# Patient Record
Sex: Female | Born: 1935 | Race: White | Hispanic: No | Smoking: Never smoker
Health system: Southern US, Community
[De-identification: ages and names within clinical notes are randomized; demographics above are authoritative.]

## PROBLEM LIST (undated history)

## (undated) DIAGNOSIS — Z860100 Personal history of colon polyps, unspecified: Secondary | ICD-10-CM

## (undated) DIAGNOSIS — I341 Nonrheumatic mitral (valve) prolapse: Secondary | ICD-10-CM

## (undated) DIAGNOSIS — F419 Anxiety disorder, unspecified: Secondary | ICD-10-CM

## (undated) DIAGNOSIS — I639 Cerebral infarction, unspecified: Secondary | ICD-10-CM

## (undated) DIAGNOSIS — F329 Major depressive disorder, single episode, unspecified: Secondary | ICD-10-CM

## (undated) DIAGNOSIS — M545 Low back pain, unspecified: Secondary | ICD-10-CM

## (undated) DIAGNOSIS — G4700 Insomnia, unspecified: Secondary | ICD-10-CM

## (undated) DIAGNOSIS — K219 Gastro-esophageal reflux disease without esophagitis: Secondary | ICD-10-CM

## (undated) DIAGNOSIS — I495 Sick sinus syndrome: Secondary | ICD-10-CM

## (undated) DIAGNOSIS — S1981XA Other specified injuries of larynx, initial encounter: Secondary | ICD-10-CM

## (undated) DIAGNOSIS — M1990 Unspecified osteoarthritis, unspecified site: Secondary | ICD-10-CM

## (undated) DIAGNOSIS — I219 Acute myocardial infarction, unspecified: Secondary | ICD-10-CM

## (undated) DIAGNOSIS — G629 Polyneuropathy, unspecified: Secondary | ICD-10-CM

## (undated) DIAGNOSIS — R079 Chest pain, unspecified: Secondary | ICD-10-CM

## (undated) DIAGNOSIS — I4891 Unspecified atrial fibrillation: Secondary | ICD-10-CM

## (undated) DIAGNOSIS — K5790 Diverticulosis of intestine, part unspecified, without perforation or abscess without bleeding: Secondary | ICD-10-CM

## (undated) DIAGNOSIS — F32A Depression, unspecified: Secondary | ICD-10-CM

## (undated) DIAGNOSIS — A689 Relapsing fever, unspecified: Secondary | ICD-10-CM

## (undated) DIAGNOSIS — K589 Irritable bowel syndrome without diarrhea: Secondary | ICD-10-CM

## (undated) HISTORY — DX: Nonrheumatic mitral (valve) prolapse: I34.1

## (undated) HISTORY — PX: ABDOMINAL HYSTERECTOMY: SHX81

## (undated) HISTORY — DX: Cerebral infarction, unspecified: I63.9

## (undated) HISTORY — DX: Irritable bowel syndrome, unspecified: K58.9

## (undated) HISTORY — DX: Acute myocardial infarction, unspecified: I21.9

## (undated) HISTORY — DX: Gastro-esophageal reflux disease without esophagitis: K21.9

## (undated) HISTORY — PX: NOSE SURGERY: SHX723

## (undated) HISTORY — DX: Anxiety disorder, unspecified: F41.9

## (undated) HISTORY — DX: Unspecified atrial fibrillation: I48.91

## (undated) HISTORY — DX: Personal history of colonic polyps: Z86.010

## (undated) HISTORY — PX: CHOLECYSTECTOMY: SHX55

## (undated) HISTORY — DX: Other specified injuries of larynx, initial encounter: S19.81XA

## (undated) HISTORY — DX: Personal history of colon polyps, unspecified: Z86.0100

## (undated) HISTORY — PX: LAPAROTOMY: SHX154

## (undated) HISTORY — PX: BACK SURGERY: SHX140

## (undated) HISTORY — DX: Major depressive disorder, single episode, unspecified: F32.9

## (undated) HISTORY — DX: Depression, unspecified: F32.A

## (undated) HISTORY — DX: Diverticulosis of intestine, part unspecified, without perforation or abscess without bleeding: K57.90

## (undated) HISTORY — DX: Unspecified osteoarthritis, unspecified site: M19.90

## (undated) HISTORY — DX: Low back pain, unspecified: M54.50

## (undated) HISTORY — DX: Polyneuropathy, unspecified: G62.9

## (undated) HISTORY — DX: Insomnia, unspecified: G47.00

## (undated) HISTORY — DX: Relapsing fever, unspecified: A68.9

## (undated) HISTORY — DX: Low back pain: M54.5

---

## 1995-11-04 ENCOUNTER — Encounter: Payer: Self-pay | Admitting: Gastroenterology

## 1998-12-22 ENCOUNTER — Other Ambulatory Visit: Admission: RE | Admit: 1998-12-22 | Discharge: 1998-12-22 | Payer: Self-pay | Admitting: Obstetrics and Gynecology

## 1999-04-27 ENCOUNTER — Encounter: Payer: Self-pay | Admitting: Emergency Medicine

## 1999-04-27 ENCOUNTER — Emergency Department (HOSPITAL_COMMUNITY): Admission: EM | Admit: 1999-04-27 | Discharge: 1999-04-27 | Payer: Self-pay | Admitting: Emergency Medicine

## 2000-07-15 ENCOUNTER — Other Ambulatory Visit: Admission: RE | Admit: 2000-07-15 | Discharge: 2000-07-15 | Payer: Self-pay | Admitting: Obstetrics and Gynecology

## 2001-01-19 ENCOUNTER — Encounter: Payer: Self-pay | Admitting: Gastroenterology

## 2001-01-19 ENCOUNTER — Ambulatory Visit (HOSPITAL_COMMUNITY): Admission: RE | Admit: 2001-01-19 | Discharge: 2001-01-19 | Payer: Self-pay | Admitting: Gastroenterology

## 2001-04-20 ENCOUNTER — Encounter: Payer: Self-pay | Admitting: Internal Medicine

## 2001-04-20 ENCOUNTER — Encounter: Admission: RE | Admit: 2001-04-20 | Discharge: 2001-04-20 | Payer: Self-pay | Admitting: Internal Medicine

## 2001-08-18 ENCOUNTER — Other Ambulatory Visit: Admission: RE | Admit: 2001-08-18 | Discharge: 2001-08-18 | Payer: Self-pay | Admitting: Obstetrics and Gynecology

## 2002-09-08 ENCOUNTER — Encounter: Payer: Self-pay | Admitting: Internal Medicine

## 2002-09-08 ENCOUNTER — Encounter: Admission: RE | Admit: 2002-09-08 | Discharge: 2002-09-08 | Payer: Self-pay | Admitting: Internal Medicine

## 2003-10-06 ENCOUNTER — Emergency Department (HOSPITAL_COMMUNITY): Admission: EM | Admit: 2003-10-06 | Discharge: 2003-10-06 | Payer: Self-pay | Admitting: Emergency Medicine

## 2005-01-07 ENCOUNTER — Encounter: Admission: RE | Admit: 2005-01-07 | Discharge: 2005-01-07 | Payer: Self-pay | Admitting: Internal Medicine

## 2005-04-02 ENCOUNTER — Ambulatory Visit: Payer: Self-pay | Admitting: Internal Medicine

## 2005-04-11 ENCOUNTER — Ambulatory Visit: Payer: Self-pay | Admitting: Internal Medicine

## 2005-04-11 ENCOUNTER — Ambulatory Visit: Payer: Self-pay

## 2005-11-20 ENCOUNTER — Ambulatory Visit: Payer: Self-pay | Admitting: Gastroenterology

## 2005-11-26 ENCOUNTER — Ambulatory Visit: Payer: Self-pay | Admitting: *Deleted

## 2005-12-10 ENCOUNTER — Ambulatory Visit (HOSPITAL_COMMUNITY): Admission: RE | Admit: 2005-12-10 | Discharge: 2005-12-10 | Payer: Self-pay | Admitting: Gastroenterology

## 2006-01-10 ENCOUNTER — Ambulatory Visit (HOSPITAL_COMMUNITY): Admission: RE | Admit: 2006-01-10 | Discharge: 2006-01-10 | Payer: Self-pay | Admitting: Gastroenterology

## 2006-01-27 ENCOUNTER — Ambulatory Visit: Payer: Self-pay | Admitting: Gastroenterology

## 2006-01-29 ENCOUNTER — Ambulatory Visit: Payer: Self-pay | Admitting: Gastroenterology

## 2006-02-11 ENCOUNTER — Ambulatory Visit: Payer: Self-pay | Admitting: Gastroenterology

## 2006-03-19 ENCOUNTER — Ambulatory Visit: Payer: Self-pay | Admitting: Gastroenterology

## 2006-03-20 ENCOUNTER — Ambulatory Visit: Payer: Self-pay | Admitting: Internal Medicine

## 2006-07-16 ENCOUNTER — Ambulatory Visit: Payer: Self-pay | Admitting: Gastroenterology

## 2006-09-05 ENCOUNTER — Ambulatory Visit: Payer: Self-pay | Admitting: Gastroenterology

## 2006-09-18 ENCOUNTER — Ambulatory Visit: Payer: Self-pay | Admitting: Internal Medicine

## 2006-10-31 ENCOUNTER — Ambulatory Visit: Payer: Self-pay | Admitting: Gastroenterology

## 2006-10-31 LAB — CONVERTED CEMR LAB
Basophils Absolute: 0 10*3/uL (ref 0.0–0.1)
MCV: 90.5 fL (ref 78.0–100.0)
Monocytes Absolute: 0.8 10*3/uL — ABNORMAL HIGH (ref 0.2–0.7)
Monocytes Relative: 7.8 % (ref 3.0–11.0)
Neutro Abs: 7.4 10*3/uL (ref 1.4–7.7)
Neutrophils Relative %: 71.7 % (ref 43.0–77.0)
RBC: 4.81 M/uL (ref 3.87–5.11)
RDW: 12.3 % (ref 11.5–14.6)
TSH: 1.74 microintl units/mL (ref 0.35–5.50)
Tissue Transglutaminase Ab, IgA: <3 units (ref <5)

## 2006-11-10 ENCOUNTER — Ambulatory Visit (HOSPITAL_COMMUNITY): Admission: RE | Admit: 2006-11-10 | Discharge: 2006-11-10 | Payer: Self-pay | Admitting: Gastroenterology

## 2006-12-02 ENCOUNTER — Ambulatory Visit: Payer: Self-pay | Admitting: Gastroenterology

## 2007-01-21 ENCOUNTER — Ambulatory Visit: Payer: Self-pay | Admitting: Gastroenterology

## 2007-02-03 ENCOUNTER — Encounter (INDEPENDENT_AMBULATORY_CARE_PROVIDER_SITE_OTHER): Payer: Self-pay | Admitting: Gastroenterology

## 2007-02-03 ENCOUNTER — Ambulatory Visit: Payer: Self-pay | Admitting: Gastroenterology

## 2007-02-03 HISTORY — PX: UPPER GASTROINTESTINAL ENDOSCOPY: SHX188

## 2007-03-11 ENCOUNTER — Ambulatory Visit: Payer: Self-pay | Admitting: Gastroenterology

## 2007-03-13 ENCOUNTER — Ambulatory Visit (HOSPITAL_COMMUNITY): Admission: RE | Admit: 2007-03-13 | Discharge: 2007-03-13 | Payer: Self-pay | Admitting: Gastroenterology

## 2007-03-27 ENCOUNTER — Ambulatory Visit: Payer: Self-pay | Admitting: Internal Medicine

## 2007-08-05 ENCOUNTER — Ambulatory Visit: Payer: Self-pay | Admitting: Internal Medicine

## 2007-08-06 ENCOUNTER — Ambulatory Visit: Payer: Self-pay | Admitting: Cardiology

## 2007-08-19 ENCOUNTER — Ambulatory Visit: Payer: Self-pay | Admitting: Internal Medicine

## 2007-08-21 ENCOUNTER — Encounter: Admission: RE | Admit: 2007-08-21 | Discharge: 2007-08-21 | Payer: Self-pay | Admitting: Internal Medicine

## 2007-09-16 ENCOUNTER — Ambulatory Visit: Payer: Self-pay | Admitting: Internal Medicine

## 2007-12-07 ENCOUNTER — Encounter (INDEPENDENT_AMBULATORY_CARE_PROVIDER_SITE_OTHER): Payer: Self-pay | Admitting: Gastroenterology

## 2007-12-07 DIAGNOSIS — K5731 Diverticulosis of large intestine without perforation or abscess with bleeding: Secondary | ICD-10-CM | POA: Insufficient documentation

## 2007-12-07 DIAGNOSIS — K5730 Diverticulosis of large intestine without perforation or abscess without bleeding: Secondary | ICD-10-CM

## 2008-05-03 ENCOUNTER — Encounter: Admission: RE | Admit: 2008-05-03 | Discharge: 2008-05-03 | Payer: Self-pay | Admitting: Internal Medicine

## 2008-06-22 ENCOUNTER — Ambulatory Visit: Payer: Self-pay | Admitting: Internal Medicine

## 2008-06-23 ENCOUNTER — Ambulatory Visit: Payer: Self-pay | Admitting: Internal Medicine

## 2008-06-23 DIAGNOSIS — K589 Irritable bowel syndrome without diarrhea: Secondary | ICD-10-CM | POA: Insufficient documentation

## 2008-06-23 DIAGNOSIS — K219 Gastro-esophageal reflux disease without esophagitis: Secondary | ICD-10-CM | POA: Insufficient documentation

## 2008-09-26 ENCOUNTER — Encounter: Admission: RE | Admit: 2008-09-26 | Discharge: 2008-09-26 | Payer: Self-pay | Admitting: Internal Medicine

## 2009-01-30 ENCOUNTER — Telehealth: Payer: Self-pay | Admitting: Internal Medicine

## 2009-01-31 ENCOUNTER — Ambulatory Visit: Payer: Self-pay | Admitting: Gastroenterology

## 2009-02-07 DIAGNOSIS — G4700 Insomnia, unspecified: Secondary | ICD-10-CM

## 2009-02-07 DIAGNOSIS — G8929 Other chronic pain: Secondary | ICD-10-CM | POA: Insufficient documentation

## 2009-02-07 DIAGNOSIS — I059 Rheumatic mitral valve disease, unspecified: Secondary | ICD-10-CM

## 2009-02-07 DIAGNOSIS — F341 Dysthymic disorder: Secondary | ICD-10-CM

## 2009-02-07 DIAGNOSIS — I480 Paroxysmal atrial fibrillation: Secondary | ICD-10-CM | POA: Insufficient documentation

## 2009-02-07 DIAGNOSIS — M549 Dorsalgia, unspecified: Secondary | ICD-10-CM

## 2009-02-07 DIAGNOSIS — M1990 Unspecified osteoarthritis, unspecified site: Secondary | ICD-10-CM | POA: Insufficient documentation

## 2009-02-07 DIAGNOSIS — G609 Hereditary and idiopathic neuropathy, unspecified: Secondary | ICD-10-CM | POA: Insufficient documentation

## 2009-02-22 ENCOUNTER — Encounter: Payer: Self-pay | Admitting: Internal Medicine

## 2009-02-22 ENCOUNTER — Encounter: Admission: RE | Admit: 2009-02-22 | Discharge: 2009-02-22 | Payer: Self-pay | Admitting: Internal Medicine

## 2009-06-23 ENCOUNTER — Ambulatory Visit: Payer: Self-pay | Admitting: Internal Medicine

## 2009-07-26 ENCOUNTER — Telehealth: Payer: Self-pay | Admitting: Internal Medicine

## 2009-07-31 ENCOUNTER — Encounter: Admission: RE | Admit: 2009-07-31 | Discharge: 2009-07-31 | Payer: Self-pay | Admitting: Obstetrics and Gynecology

## 2009-08-03 ENCOUNTER — Ambulatory Visit: Payer: Self-pay | Admitting: Internal Medicine

## 2009-08-29 ENCOUNTER — Telehealth: Payer: Self-pay | Admitting: Internal Medicine

## 2010-03-20 ENCOUNTER — Encounter: Admission: RE | Admit: 2010-03-20 | Discharge: 2010-03-20 | Payer: Self-pay | Admitting: Obstetrics and Gynecology

## 2010-03-23 ENCOUNTER — Encounter: Admission: RE | Admit: 2010-03-23 | Discharge: 2010-03-23 | Payer: Self-pay | Admitting: Obstetrics and Gynecology

## 2010-04-12 ENCOUNTER — Ambulatory Visit: Payer: Self-pay | Admitting: Internal Medicine

## 2010-04-12 ENCOUNTER — Encounter (INDEPENDENT_AMBULATORY_CARE_PROVIDER_SITE_OTHER): Payer: Self-pay | Admitting: *Deleted

## 2010-04-12 DIAGNOSIS — K625 Hemorrhage of anus and rectum: Secondary | ICD-10-CM

## 2010-04-12 DIAGNOSIS — Z860100 Personal history of colon polyps, unspecified: Secondary | ICD-10-CM | POA: Insufficient documentation

## 2010-05-17 ENCOUNTER — Ambulatory Visit: Payer: Self-pay | Admitting: Internal Medicine

## 2010-05-17 HISTORY — PX: COLONOSCOPY: SHX5424

## 2010-05-21 ENCOUNTER — Telehealth: Payer: Self-pay | Admitting: Internal Medicine

## 2010-09-17 ENCOUNTER — Encounter: Admission: RE | Admit: 2010-09-17 | Discharge: 2010-09-17 | Payer: Self-pay | Admitting: Obstetrics and Gynecology

## 2010-11-15 NOTE — Procedures (Signed)
Summary: Colonoscopy  Patient: Kaitlin [name] Note: All result statuses are Final unless otherwise noted.  Tests: (1) Colonoscopy (COL)   COL Colonoscopy           DONE     [facility]     [address]     [place], [place]  [address]           COLONOSCOPY PROCEDURE REPORT           PATIENT:  Kaitlin, [name]  MR#:  [id number]     BIRTHDATE:  08-06-1927, 82 yrs. old  GENDER:  female     ENDOSCOPIST:  Iva Boop, MD, [facility]           PROCEDURE DATE:  05/17/2010     PROCEDURE:  Colonoscopy 19[address]     ASA CLASS:  Class II     INDICATIONS:  rectal bleeding, diverticulitis, history of polyps           MEDICATIONS:   Fentanyl 50 mcg IV, Versed 7 mg IV           DESCRIPTION OF PROCEDURE:   After the risks benefits and     alternatives of the procedure were thoroughly explained, informed     consent was obtained.  Digital rectal exam was performed and     revealed no abnormalities.   The LB [id number]-[id number] endoscope     was introduced through the anus and advanced to the cecum, which     was identified by both the appendix and ileocecal valve.     Moderatelydifficult insertion due to tortuosity and angulated and     fixed sigmoid colon.  The quality of the prep was excellent, using     MoviPrep.  The instrument was then slowly withdrawn as the colon     was fully examined. Insertion: 8:37 minutes Withdrawal: 6:21     minutes     <<PROCEDUREIMAGES>>           FINDINGS:  Severe diverticulosis was found in the sigmoid colon.     This was otherwise a normal examination of the colon. Cecum was not     entered deeply.   Retroflexed views in the rectum revealed no     abnormalities.    The scope was then withdrawn from the patient     and the procedure completed.           COMPLICATIONS:  None     ENDOSCOPIC IMPRESSION:     1) Severe diverticulosis in the sigmoid colon     2) Otherwise normal examination, excellent prep.     RECOMMENDATIONS:     Follow-up as needed.     I believe  rectal bleeding was from ano-rectal irritation.     REPEAT EXAM:  In for not necessary.           Iva Boop, MD, [facility]           CC:  [name], MD     The Patient           n.     eSIGNED:   Iva Boop at 05/17/2010 12:02 PM           Karle Starch, [phone]  Note: An exclamation mark (!) indicates a result that was not dispersed into the flowsheet. Document Creation Date: 05/17/2010 12:04 PM _______________________________________________________________________  (1) Order result status: Final Collection or observation date-time: 05/17/2010 11:51 Requested date-time:  Receipt date-time:  Reported date-time:  Referring Physician:   Ordering Physician: Stan Head [phone]) Specimen Source:  Source: Launa Grill Order Number: [id number] Lab site:

## 2010-11-15 NOTE — Progress Notes (Signed)
Summary: TRIAGE   Phone Note Call from Patient Call back at Home Phone [phone]   Call For: Dr Leone Payor Reason for Call: Talk to Nurse Summary of Call: Colon thursday and feels gassy and burby. Pain in the left side on her ribs is this normal? Has eye appt at 1pm would like call back either before or after 2pm Initial call taken by: Leanor Kail [facility],  May 21, 2010 9:51 AM  Follow-up for Phone Call        Colonoscopy 05-17-10. Pt. is having "Alot of sorness right under my ribs"  Discomfort is RUQ and right side, also increase bloating/burping. She had a low grade temp. on Sat, none since. Denies constipation, diarrhea, blood,black stools.   1) Soft,bland diet. No spicy,greasy,fried foods, for 2-3 days, then advance diet as tolerated. 2) Gas-x,Phazyme, etc. QID for 2-3 days , then as needed for gas & bloating. 3) tylenol/Ibuprofen as needed 4) Heating pad to abdomen as needed. 5) If symptoms become worse call back immediately or go to ER. 6) I will call pt., if new orders, after MD reviews.    Follow-up by: Laureen Ochs LPN,  May 21, 2010 10:29 AM  Additional Follow-up for Phone Call Additional follow up Details #1::        This is correct advice and let us call her back tomorrow to see how she is. Additional Follow-up by: Iva Boop MD, [facility],  May 21, 2010 10:46 AM    Additional Follow-up for Phone Call Additional follow up Details #2::    Update: Pt. states she is feeling better today. If symptoms become worse, pt. will call back immediately, otherwise she will call as needed. Follow-up by: Laureen Ochs LPN,  May 22, 2010 8:16 AM

## 2010-11-15 NOTE — Assessment & Plan Note (Signed)
Summary: DIVERTICULITIS/YF    History of Present Illness Visit Type: Follow-up Visit Primary GI MD: Stan Head MD [facility] Primary Provider: Sharlyn Bologna, MD Requesting Provider: n/a Chief Complaint: Diverticulitis flare 1 month ago, blood in stool History of Present Illness:   77 yo woman with IBS and severe diverticulosis. She had abdominal pain and fever in May with rectal bleedin. Tm > 100. she self treated with hydrocodone. She was ill x a couple of days. Bilateral lower quadrant pain. Bowels were loose then and then became constipated after hydrocodone. She had rectal bleeding with bright red blood on papre only. Ha s had inermttent mild symptoms since, generally ok. No weight loss.    GI Review of Systems      Denies abdominal pain, acid reflux, belching, bloating, chest pain, dysphagia with liquids, dysphagia with solids, heartburn, loss of appetite, nausea, vomiting, vomiting blood, weight loss, and  weight gain.      Reports change in bowel habits, fecal incontinence, and  rectal bleeding.     Denies anal fissure, black tarry stools, constipation, diarrhea, diverticulosis, heme positive stool, hemorrhoids, irritable bowel syndrome, jaundice, light color stool, liver problems, and  rectal pain.    Current Medications (verified): 1)  Ogen 1.25 1.5 Mg Tabs (Estropipate) .... Take 1 Tablet By Mouth Once A Day 2)  Toprol Xl 50 Mg Xr24h-Tab (Metoprolol Succinate) .... Take 1 Tablet By Mouth Once A Day 3)  Vitamin D 2000 Unit Tabs (Cholecalciferol) .... Once Daily 4)  Super B Complex  Tabs (B Complex-C) .... Take 1 Tablet By Mouth Once A Day 5)  Fish Oil Concentrate 1000 Mg Caps (Omega-3 Fatty Acids) .... Take 2 Tabs By Mouth Once Daily 6)  Hydrocodone-Acetaminophen 5-500 Mg Tabs (Hydrocodone-Acetaminophen) .... As Needed 7)  Alprazolam 0.5 Mg Tabs (Alprazolam) .... Take As Needed At Bedtime  Allergies (verified): 1)  Codeine 2)  Axid  Past History:  Past Medical  History: Chronic right upper/lowerquadrant pain. Severe diverticulosis Colon polyp 3mm, destroyed (2003) Gastroesophageal reflux disease. Osteoarthritis. Anxiety and depression. Irritable bowel syndrome. Mitral valve prolapse. Diverticulitis Insomnia Low back pain. Peripheral neuropathy. Atrial tachycardia. Atrial fibrillation. Laryngeal penetration (MBS) IBS  Past Surgical History: Reviewed history from 08/03/2009 and no changes required. cholecystectomy Postoperative bleeding after her cholecystectomy requiring     laparotomy. Back Surgery Hysterectomy  Family History: Reviewed history from 06/23/2009 and no changes required. Throid Cancer: Sister Family History of Prostate Cancer:Grandfather Family History of Diabetes: Father Family History of Heart Disease: Mother, Sister No FH of Colon Cancer:  Social History: Reviewed history from 06/23/2008 and no changes required. Occupation: Retired Patient has never smoked.  Alcohol Use - no Daily Caffeine Use-2 Illicit Drug Use - no Patient gets regular exercise.  Vital Signs:  Patient profile:   77 year old female Height:      66 inches Weight:      131.25 pounds BMI:     21.26 Pulse rate:   60 / minute Pulse rhythm:   regular BP sitting:   118 / 68  (left arm)  Vitals Entered By: Milford Cage NCMA (April 12, 2010 8:40 AM)  Physical Exam  General:  Well developed, well nourished, no acute distress. Eyes:  no icterus.  Lungs:  Clear throughout to auscultation. Heart:  Regular rate and rhythm; no murmurs, rubs,  or bruits. Abdomen:  Soft, nontender and nondistended. No masses, hepatosplenomegaly or hernias noted. Normal bowel sounds. Rectal:  deferred until time of colonoscopy.   Neurologic:  Alert and  oriented x3 Psych:  Alert and cooperative. Normal mood and affect.   Impression & Recommendations:  Problem # 1:  RECTAL BLEEDING (ICD-569.3) Assessment New Sounds likely ano-rectal. Associated with  canges in bowels that are probably IBS but as been 8 years since colonscopy so reinvestigation reasonable.  Risks, benefits,and indications of endoscopic procedure(s) were reviewed with the patient and all questions answered.  Orders: Colonoscopy (Colon)  Problem # 2:  COLONIC POLYPS, HX OF (ICD-V12.72) Assessment: Unchanged 3 mm polyp destroyed, could have been an adenoma. Orders: Colonoscopy (Colon)  Problem # 3:  IRRITABLE BOWEL SYNDROME (ICD-564.1) Assessment: Unchanged Overall suspect this is issue. Some ? of diverticulitis. She has had the fever issue x years. Orders: Colonoscopy (Colon)  Patient Instructions: 1)  Please pick up your medications at your pharmacy. MOVIPREP 2)  We will see you at your procedure on 05/17/10. 3)  [facility] Patient Information Guide given to patient.  4)  Colonoscopy and Flexible Sigmoidoscopy brochure given.  5)  Copy sent to : [name], MD 6)  The medication list was reviewed and reconciled.  All changed / newly prescribed medications were explained.  A complete medication list was provided to the patient / caregiver. Prescriptions: MOVIPREP 100 GM  SOLR (PEG-KCL-NACL-NASULF-NA ASC-C) As per prep instructions.  #1 x 0   Entered by:   Francee Piccolo CMA (AAMA)   Authorized by:   Iva Boop MD, [facility]   Signed by:   Francee Piccolo CMA (AAMA) on 04/12/2010   Method used:   Electronically to        [facility]  [id number] (ret[facility])       [address]       [place], [place]  [address]       Ph: [phone] or [phone]       Fax: [phone]   RxID:   [id number]

## 2010-11-15 NOTE — Letter (Signed)
Summary: [facility] Instructions  [facility]  [address] [place], [place] [address]   Phone: [phone]  Fax: [phone]       CHENAE BRAGER    1927/04/07    MRN: [id number]      Procedure Day Dorna Bloom: THURSDAY, May 17, 2010     Arrival Time: 10:00 AM      Procedure Time: 11:00 AM    Location of Procedure:                    _X_  [facility] (4th Floor)  PREPARATION FOR COLONOSCOPY WITH MOVIPREP   Starting 5 days prior to your procedure 05/13/10 do not eat nuts, seeds, popcorn, corn, beans, peas,  salads, or any raw vegetables.  Do not take any fiber supplements (e.g. Metamucil, Citrucel, and Benefiber).  THE DAY BEFORE YOUR PROCEDURE         WEDNESDAY, 05/16/10  1.  Drink clear liquids the entire day-NO SOLID FOOD  2.  Do not drink anything colored red or purple.  Avoid juices with pulp.  No orange juice.  3.  Drink at least 64 oz. (8 glasses) of fluid/clear liquids during the day to prevent dehydration and help the prep work efficiently.  CLEAR LIQUIDS INCLUDE: Water Jello Ice Popsicles Tea (sugar ok, no milk/cream) Powdered fruit flavored drinks Coffee (sugar ok, no milk/cream) Gatorade Juice: apple, white grape, white cranberry  Lemonade Clear bullion, consomm, broth Carbonated beverages (any kind) Strained chicken noodle soup Hard Candy                             4.  In the morning, mix first dose of MoviPrep solution:    Empty 1 Pouch A and 1 Pouch B into the disposable container    Add lukewarm drinking water to the top line of the container. Mix to dissolve    Refrigerate (mixed solution should be used within 24 hrs)  5.  Begin drinking the prep at 5:00 p.m. The MoviPrep container is divided by 4 marks.   Every 15 minutes drink the solution down to the next mark (approximately 8 oz) until the full liter is complete.   6.  Follow completed prep with 16 oz of clear liquid of your choice (Nothing red or purple).  Continue to  drink clear liquids until bedtime.  7.   Mix second dose of MoviPrep solution:    Empty 1 Pouch A and 1 Pouch B into the disposable container    Add lukewarm drinking water to the top line of the container. Mix to dissolve    Refrigerate  Beginning at 9:00 p.m. (5 hours before procedure):         1. Every 15 minutes, drink the solution down to the next mark (approx 8 oz) until the full liter is complete.         2. Follow completed prep with 16 oz. of clear liquid of your choice.    THE DAY OF YOUR PROCEDURE      THURSDAY, 05/17/10  1. You may drink clear liquids until 9:00 AM (2 HOURS BEFORE PROCEDURE).  MEDICATION INSTRUCTIONS  Unless otherwise instructed, you should take regular prescription medications with a small sip of water   as early as possible the morning of your procedure.       OTHER INSTRUCTIONS  You will need a responsible adult at least 75 years of age to accompany  you and drive you home.   This person must remain in the waiting room during your procedure.  Wear loose fitting clothing that is easily removed.  Leave jewelry and other valuables at home.  However, you may wish to bring a book to read or  an iPod/MP3 player to listen to music as you wait for your procedure to start.  Remove all body piercing jewelry and leave at home.  Total time from sign-in until discharge is approximately 2-3 hours.  You should go home directly after your procedure and rest.  You can resume normal activities the  day after your procedure.  The day of your procedure you should not:   Drive   Make legal decisions   Operate machinery   Drink alcohol   Return to work  You will receive specific instructions about eating, activities and medications before you leave.   The above instructions have been reviewed and explained to me by   _______________________   I fully understand and can verbalize these instructions _____________________________ Date _________

## 2010-12-17 ENCOUNTER — Encounter: Payer: Self-pay | Admitting: Internal Medicine

## 2010-12-27 ENCOUNTER — Encounter: Payer: Self-pay | Admitting: Internal Medicine

## 2010-12-27 ENCOUNTER — Ambulatory Visit (INDEPENDENT_AMBULATORY_CARE_PROVIDER_SITE_OTHER): Payer: Medicare Other | Admitting: Internal Medicine

## 2010-12-27 DIAGNOSIS — K5730 Diverticulosis of large intestine without perforation or abscess without bleeding: Secondary | ICD-10-CM

## 2010-12-27 DIAGNOSIS — F341 Dysthymic disorder: Secondary | ICD-10-CM

## 2010-12-27 DIAGNOSIS — K589 Irritable bowel syndrome without diarrhea: Secondary | ICD-10-CM

## 2011-01-01 NOTE — Assessment & Plan Note (Signed)
Summary: Diarrhea  Medications Added METRONIDAZOLE 250 MG TABS (METRONIDAZOLE) 1 by mouth three times a day x 10 days      Allergies Added:   History of Present Illness Visit Type: Follow-up Visit Primary GI MD: Stan Head MD [facility] Primary Provider: Sharlyn Bologna, MD Requesting Provider: n/a Chief Complaint: Diarrhea with chills and fever and lower abdominal pain History of Present Illness:   77 yo ww here with daughter today "I went out of town for 2 weeks but had to stay 5 due to diarrhea, chills and low-grade fever". she was visiting [place] in [place] loose stools mostly in AM for 3-4 hours, not at night. No recent antibiotics. T max 100.8 Spells are similar to what she has had in past Bowel habits due form up at time. No sugar free candy use. Seems to tolerate milk products. patient ?'s if she could be allergic to the clips from cholecystectomy. daughter indicates "this all started after she had a second, emergency surgery after cholecystectomy" - years ago.    Dr. Earl Gala told her back pain was from severe osteoarthritis. hydrocodone will help back pain and causes slowing of defecation. Also has chronic leg pain and neuropathy.    GI Review of Systems    Reports abdominal pain.     Location of  Abdominal pain: lower abdomen.    Denies acid reflux, belching, bloating, chest pain, dysphagia with liquids, dysphagia with solids, heartburn, loss of appetite, nausea, vomiting, vomiting blood, weight loss, and  weight gain.      Reports diarrhea.     Denies anal fissure, black tarry stools, change in bowel habit, constipation, diverticulosis, fecal incontinence, heme positive stool, hemorrhoids, irritable bowel syndrome, jaundice, light color stool, liver problems, rectal bleeding, and  rectal pain.    Current Medications (verified): 1)  Ogen 1.25 1.5 Mg Tabs (Estropipate) .... Take 1 Tablet By Mouth Once A Day 2)  Toprol Xl 50 Mg Xr24h-Tab  (Metoprolol Succinate) .... Take 1 Tablet By Mouth Once A Day 3)  Vitamin D 2000 Unit Tabs (Cholecalciferol) .... Once Daily 4)  Super B Complex  Tabs (B Complex-C) .... Take 1 Tablet By Mouth Once A Day 5)  Fish Oil Concentrate 1000 Mg Caps (Omega-3 Fatty Acids) .... Take 2 Tabs By Mouth Once Daily 6)  Hydrocodone-Acetaminophen 5-500 Mg Tabs (Hydrocodone-Acetaminophen) .... As Needed 7)  Alprazolam 0.5 Mg Tabs (Alprazolam) .... Take As Needed At Bedtime  Allergies (verified): 1)  Codeine 2)  Axid  Past History:  Past Medical History: Last updated: 04/12/2010 Chronic right upper/lowerquadrant pain. Severe diverticulosis Colon polyp 3mm, destroyed (2003) Gastroesophageal reflux disease. Osteoarthritis. Anxiety and depression. Irritable bowel syndrome. Mitral valve prolapse. Diverticulitis Insomnia Low back pain. Peripheral neuropathy. Atrial tachycardia. Atrial fibrillation. Laryngeal penetration (MBS) IBS  Past Surgical History: Last updated: 08/03/2009 cholecystectomy Postoperative bleeding after her cholecystectomy requiring     laparotomy. Back Surgery Hysterectomy  Family History: Last updated: 06/23/2009 Throid Cancer: Sister Family History of Prostate Cancer:Grandfather Family History of Diabetes: Father Family History of Heart Disease: Mother, Sister No FH of Colon Cancer:  Social History: Last updated: 06/23/2008 Occupation: Retired Patient has never smoked.  Alcohol Use - no Daily Caffeine Use-2 Illicit Drug Use - no Patient gets regular exercise.  Review of Systems       Chills   Vital Signs:  Patient profile:   77 year old female Height:      66 inches Weight:      125 pounds BMI:  20.25 BSA:     1.64 Pulse rate:   80 / minute Pulse rhythm:   regular BP sitting:   124 / 62  (left arm)  Vitals Entered By: Merri Ray CMA Duncan Dull) (December 27, 2010 3:53 PM)  Physical Exam  General:  Well developed, well nourished, no acute  distress. Lungs:  Clear throughout to auscultation. Heart:  Regular rate and rhythm; no murmurs, rubs,  or bruits. Abdomen:  Soft, nontender and nondistended. No masses, hepatosplenomegaly or hernias noted. Normal bowel sounds. Psych:  anxious  CBC, CMET ESR all normal except glucose 121 - Dr. Earl Gala  Impression & Recommendations:  Problem # 1:  IRRITABLE BOWEL SYNDROME (ICD-564.1) Assessment Deteriorated she has had similar problems x years. Understand they can be disabling but seems most like IBS. Dr. Corinda Gubler had same conclusion. she could have more of an episodic post-chole diarrhea. acknowledges that stress exacerbates - seems like she had some around time of visit to her birthplace and brother but did not elaborate fails to see that this is same problem and accept IBS colonoscopy 2011 with severe diverticulosis as before reports of fever x yeras, has had extensive eval including ID referral in past  1) empiric metronidazole- could have some small bowel bacterial overgrowth or even infection on top of IBS 2) creon 24K sammples given - she may benefit from this and is to try if metronidazole does not solev this current problem 3) rassured but am not certain it took  note that alprazolam helps but she is against any other medication or more use of that - I raised Cymbalta given back pai (suggested she discuss with PCP) but she does not want Rx like that -   Problem # 2:  ANXIETY DEPRESSION (ICD-300.4) Assessment: Deteriorated clearly upset over these problems and has longstanding underlying issues with this  Problem # 3:  DIVERTICULOSIS OF COLON (ICD-562.10) Assessment: Unchanged  Patient Instructions: 1)  You have been given a prescription for metronidazole.  2)  If no better after taking antibiotics, please start Creon samples one tablet by mouth three times a day with meals. Please call us back for a prescription if this medication helps with your symptoms pr as needed. 3)   Copy sent to : Sharlyn Bologna, MD 4)  The medication list was reviewed and reconciled.  All changed / newly prescribed medications were explained.  A complete medication list was provided to the patient / caregiver. Prescriptions: METRONIDAZOLE 250 MG TABS (METRONIDAZOLE) 1 by mouth three times a day x 10 days  #30 x 0   Entered and Authorized by:   Iva Boop MD, [facility]   Signed by:   Iva Boop MD, FACG on 12/27/2010   Method used:   Print then Give to Patient   RxID:   [id number]

## 2011-02-26 NOTE — Assessment & Plan Note (Signed)
[facility]                         GASTROENTEROLOGY OFFICE NOTE   NAME:Kaitlin [name], Kaitlin [name]                      MRN:          [id number]  DATE:03/11/2007                            DOB:          [birth year]    The patient comes in says she has been having some abdominal pain with  chills, choking feeling in the throat, dysphagia, sounds like a motility  problem.  The patient has no appetite, nausea.  Sounds like some  depression, but she denies this.  She states she is not sleeping.  Main  concern is some right CVA pain and some right upper quadrant pain.   PHYSICAL EXAMINATION:  She looks fine.  Her weight was 117, blood pressure 132/70, pulse 68 and regular.  Neck and upper extremities unremarkable.  She did have some discomfort  on her right upper quadrant and right lower quadrant.  Some discomfort  was noted over the bladder.   This very nice lady was worked up by myself for possibly having sprue,  but this was negative.  Her biopsies were negative.   MEDICATIONS:  1. Ogen.  2. Toprol.  3. Vitamins.  4. AcipHex 1 daily.   IMPRESSION:  Abdominal pain, as described, of unknown etiology.  Rule  out possibility of diverticulitis.   RECOMMENDATIONS:  Start her on some Cipro 500 mg b.i.d. and some Align.  Discussed with her barium swallow and an upper GI and small bowel series  to be on the complete side.  She is given prescription for Cipro.  I  gave her some Align pills and told her to call us back if she is not  improved, and to follow up after the x-rays and treatment with Dr.  Leone Payor, whom I have referred her to.     Ulyess Mort, MD  Electronically Signed    SML/MedQ  DD: 03/11/2007  DT: 03/11/2007  Job #: [id number]

## 2011-02-26 NOTE — Assessment & Plan Note (Signed)
[facility]                         GASTROENTEROLOGY OFFICE NOTE   NAME:Kaitlin [name], Kaitlin [name]                      MRN:          [id number]  DATE:03/27/2007                            DOB:          05-14-1927    CHIEF COMPLAINT:  Abdominal pain.  Followup of studies.   Ms. Baumbach is a long-standing patient of Dr. Blossom Hoops, who is  transferring her care to me with his retirement.  She has a long history  of right-sided abdominal pain and fluctuating weight levels.  Recently,  things had flared up and she had an upper GI series and a small bowel  follow through.  The upper GI small bowel follow through demonstrated a  small hiatal hernia and otherwise normal exam.  There was no hernia  seen, though there was some question that she had a femoral or inguinal  hernia.  She has a bulge in the right lower quadrant or a knot that  occurs there.  She had some bloating.  She had some vague dysphagia  problems at times.  She chokes on food.  It is hard to swallow.  She had  an upper endoscopy in April that was okay.  She has postprandial  diarrhea that is frequent.  Last colonoscopy several years ago.  She has  a very tortuous diverticula-laden colon.  When one looks back through  the chart and the records, she has had this chronic right upper quadrant  pain ever since she had her cholecystectomy.  She had an ERCP in 1997  that was normal.  She has chronically mildly dilated bile ducts,  standard or normal for post cholecystectomy.  She has had some positive  antigliaden antibodies, but a negative small bowel biopsy.  She recently  had some problems with microscopic hematuria.  Workup there with CT  scanning and cystoscopy through Dr. Bjorn Pippin has been unrevealing.   PROBLEMS/STUDIES:  1. Chronic right upper quadrant pain.  2. Normal gastric emptying study.  3. Small bowel follow through as above.  4. Colonoscopy with severe diverticulosis and tortuous colon,  3 mm      sessile polyp hot biopsied, not retrieved so we do not have      pathology on that.  5. Status post cholecystectomy.  6. Clinical diagnosis of gastroesophageal reflux disease.  7. History of cardiac arrhythmia.  8. Prior hysterectomy.  9. Osteoarthritis.  10.Anxiety and depression.  11.Irritable bowel syndrome.  12.Mitral valve prolapse.  13.She has had diverticulitis in the past, apparently.  14.Insomnia, helped by Xanax.  15.Low back pain.  16.Postoperative bleeding after her cholecystectomy requiring      laparotomy.  17.Peripheral neuropathy.  18.Prior spine surgery.  19.Left knee surgery for Baker's cyst.  20.Atrial tachycardia.  21.Atrial fibrillation.  22.Apparently, she has had a modified barium swallow with some      laryngeal penetration with thin liquids with mildly decreased      laryngeal closure and decreased sensation.  She is to tuck her chin      when she drinks.  She says it is hard to drink water that way.  I  suggested she use a straw.   MEDICATIONS:  1. Ogen daily.  2. Toprol 50 mg daily.  3. Fish oil daily.  4. Multivitamin daily.  5. AcipHex daily.  6. Xanax 0.25 mg p.r.n. at night.   PHYSICAL EXAM:  Anxious-appearing white woman.  Weight 115 pounds, pulse 88, blood pressure 118/72.  ABDOMEN:  Soft.  I cannot detect any true hernia.  She is minimally  tender in the right lower quadrant.  There is no organomegaly or mass.  Cholecystectomy scar is noted.   ASSESSMENT:  This lady clearly has irritable bowel syndrome, which I  think is her biggest problem.  She seems to relax and tells me her  gastrointestinal symptoms improve with the Xanax.  Her pressure in her  throat and her dysphagia seem to resolve, though she is only using that  at night.  It does not cause too much sedation, but it does allow her to  sleep.   PLAN:  I have suggested that she take the Xanax 2 to 3 times a day and I  have given her a prescription for that.  I  think that is probably the  simplest approach.  She had been prescribed Effexor in the past, but  does not want to take antidepressants.  An SSRI could potentially help  this lady versus Effexor, but she seems unwilling.  At her age and with  her problems, I think the Xanax makes sense.  She will try that and call  me back if there are other issues.  Her weight loss is noted.  This is  really not new.  She has fluctuated.  She is 115 pounds, which is a  little lower than she has been previously, but she has been in the low  120s in the past at least.   I do not think there is any further role for investigations at this time  with the 3 mm diminutive polyp and her colonoscopy findings, and  chronicity of symptoms, I think I would hold off on any type of  surveillance or screening colonoscopy at this point.     Iva Boop, MD,FACG  Electronically Signed    CEG/MedQ  DD: 03/27/2007  DT: 03/28/2007  Job #: [id number]   cc:   Kaitlin [name], M.D.  Excell Seltzer. Annabell Howells, M.D.

## 2011-02-26 NOTE — Assessment & Plan Note (Signed)
[facility]                         GASTROENTEROLOGY OFFICE NOTE   NAME:Kaitlin [name], Kaitlin [name]                      MRN:          [id number]  DATE:08/19/2007                            DOB:          01-Jun-1927    CHIEF COMPLAINT:  Followup of abdominal pain.   She had called the office recently with back pain radiating into the  suprapubic and pelvic area.  I had wondered about diverticulitis and  prescribed Cipro and Flagyl.  CT scanning did not show any evidence of  acute diverticulitis.  It showed stable post cholecystectomy biliary  dilation.  She has lumbar spine degenerative changes as well.  Those  were stable.   She describes where she really had spells of low back pain radiating  around to the bilateral pelvic areas.  She also had quite a bit of  urinary frequency and problems.  She has chronic neuropathy problems,  but she had increased paresthesias and leg pain with this as well.  She  completed the antibiotics, and she thinks the urinary symptoms are  better.  She remembered she had some hydrocodone that she was given for  cough previously, and she took two of those recently.  She has not had  any pain for several days. She does feel better.  She is current about  weight loss, but she is up 5 pounds.  There may have been some chills,  no proven fever, as I am aware of.  She was having some headaches and  loose stools but no bleeding.  She relates that she had seen Dr. Annabell Howells  because of hematuria and apparently had an extensive workup that was  negative earlier in the year.  She does not describe a diagnosis of  interstitial cystitis that I am aware of.   See my note of March 27, 2007 for a previous past medical history.  We  will add hematuria and bladder problems to that question of interstitial  cystitis.   MEDICATIONS:  Listed and reviewed on the chart.   ALLERGIES:  Listed and reviewed on the chart.   PHYSICAL EXAMINATION:  Weight 120  pounds.  Height 5 feet 7.  Pulse 67,  blood pressure 136/80.  ABDOMEN:  Soft and nontender.   ASSESSMENT:  Abdominal pain radiating to the suprapubic area with lower  extremity pain.  This may be a radicular problem with her lumbar spine  problems, although it could be some sort of bladder problem as well.  I  do not think it was a bladder problem, i.e., her irritable bowel  possible.  At any rate, she is better.   PLAN:  1. Hydrocodone/APAP 5 mg/500 mg 1 every 4 hours as needed, #30, no      refills.  2. Follow up with Drs. Earl Gala and [place] on these matters.  3. See me as needed.     Iva Boop, MD,FACG  Electronically Signed    CEG/MedQ  DD: 08/19/2007  DT: 08/19/2007  Job #: [id number]   cc:   [name], M.D.  Excell Seltzer. Annabell Howells, M.D.

## 2011-02-26 NOTE — Assessment & Plan Note (Signed)
[facility]                         ELECTROPHYSIOLOGY OFFICE NOTE   NAME:Kaitlin [name]                      MRN:          [id number]  DATE:06/22/2008                            DOB:          [birth year]    Kaitlin [name] is having occasional palpitations.  She is back down to  Toprol once a day because she could not tolerate it twice a day.   She continues to have her intermittent chills.  These are becoming  increasingly frequent over the last 11 years.  She has seen Dr. Nedra Hai in  the past, and I have suggested maybe this is some __________ again.   MEDICATIONS:  Include the Toprol 50 as well as Xanax p.r.n.   PHYSICAL EXAMINATION:  VITAL SIGNS:  Her blood pressure is 128/74 and  her pulse is 68.  LUNGS:  Clear.  HEART:  Sounds were regular.  EXTREMITIES:  Without edema.   Electrocardiogram today demonstrated sinus rhythm with occasional PACs.  The rate was 68, the intervals were 0.19/0.19/0.38, and the axis was  leftward of -55.   IMPRESSION:  1. Paroxysmal atrial fibrillation with a rapid ventricular response,      currently largely quiescent.  2. Recurrent fevers over the last 10 years.   Kaitlin [name] is doing fine.  We will plan to see her again at her  request.  She will follow up with Dr. Earl Gala.     Duke Salvia, MD, [facility]  Electronically Signed    SCK/MedQ  DD: 06/22/2008  DT: 06/23/2008  Job #: [id number]   cc:   [name], M.D.

## 2011-02-26 NOTE — Letter (Signed)
September 16, 2007    [name], M.D.  [address] [place], [place] [address]   RE:  Kaitlin [name], Kaitlin [name]  MRN:  [id number]  /  DOB:  [birth year]   Dear Rosanne Ashing,   Mrs. Burack came in today.  She has continued to have palpitations.  She notes that they are worse in the wake of these fevers and chills,  accompanied by rigors, that she has had apparently for ten years, ever  since her cholecystectomy.  She continues to wonder whether something  was left behind and I told her that radiographic sponges should not be  evident.  In any case, this continues to be an issue, despite an  extensive, protracted workup.   She says that she has had recent blood work.   CURRENT MEDICATIONS INCLUDE:  Toprol 50 mg a day.  She had called during  the summer, apparently, though I do not see a message in the chart,  asking for permission to increase it when she is having more symptoms.  She was asked to come in if she was having more problems and here she  is.  She also takes AcipHex and Xanax.   ON EXAMINATION:  Her blood pressure is 120/73, her pulse is 62.  LUNGS:  Clear.  Heart sounds were regular.  EXTREMITIES:  Without edema.   Electrocardiograms were interesting in that they demonstrate rapid  atrial fibrillation with intermittent reversion to sinus rhythm and then  back again into atrial fibrillation.   IMPRESSION:  1. Atrial fibrillation - paroxysmal with a rapid ventricular response      accompanied by chest pain and shortness of breath.  2. Recurrent fevers, chills and rigors over the last ten years, dating      back to a gallbladder surgery.   Mrs. Kambrie, [place], continues to have her atrial fibrillation.  I have  increased her Toprol from once a day to twice a day.  She is to let us  know if she has more symptoms.  Otherwise, I will plan to see her again  in nine months' time.   I did ask her to make sure also that, with her most recent blood work,  you would check the hemoglobin  and a TSH, as changes in these may  explain her increasing symptoms of atrial fibrillation.   Hope you and your family have a Merry Christmas.    Sincerely,      Duke Salvia, MD, [facility]  Electronically Signed    SCK/MedQ  DD: 09/16/2007  DT: 09/16/2007  Job #: [id number]

## 2011-02-26 NOTE — Assessment & Plan Note (Signed)
[facility]                         GASTROENTEROLOGY OFFICE NOTE   NAME:Kaitlin [name], Kaitlin [name]                      MRN:          [id number]  DATE:08/05/2007                            DOB:          24-Oct-1926    PROBLEMS:  See list from June 2008 note.   CHIEF COMPLAINT:  Abdominal pain.   The patient called the office complaining of worsening abdominal pain.  She apparently has had fever recently, up to 101.5.  I started her on  Cipro and Flagyl 500 b.i.d., both, on August 03, 2007.  Soft, bland  diet.  She has not had fever since that time, but she does tell me she  took her temperature in the last several days and it did range up to the  101.5 level.  Last day or two it has been around 99.  She is complaining  of a right lower quadrant pain, a right back pain that is fairly  intense, and some crampy left lower quadrant pain.  Additional problems  include that she has had bad licks on her head, the last one 4 weeks  ago.  Her neck has been stiff, she has had a headache.  She has not seen  her primary care physician, Dr. Earl Gala, because he is not available at  this time.  She has seen Dr. Annabell Howells of urology, as well.  He does not  have a diagnosis of interstitial cystitis that I am aware of at this  time.  I specifically questioned her about this.  She remains focused on  problems stemming from previous cholecystectomy in the late 1990s.  Apparently, she has had hemorrhage and bleeding, and she thinks she has  had a lot of problems ever since then.  She has had chronic diarrhea  since that time, but I do not think she has ever been on cholestyramine  or Colestid.  She knows she has a diagnosis of irritable bowel syndrome.  She increased her Xanax to 1 twice a day occasionally, but it does not  sound like that has benefitted her.  She feels like that her symptoms  and problems are interfering with her ability to function and to live an  active life.  She  sort of rambles at times, talking about previous H.  pylori.  Her colon and intestines are the worst twisted intestines Dr.  Doreatha Martin had ever seen (she does have severe sigmoid diverticulosis), he  mentioned the possibility of surgery for that at some point, but thought  that they should try to avoid that.  She indicates that she is very  concerned about ever having to go to the hospital because of the  terrible experience with the cholecystectomy (question presumably due to  the bleed).  She focuses on clips left inside her body that could be  causing infection, she wonders.  She is having some nocturia, but no  real dysuria or anything like that that I can tell.  She went for a  modified barium swallow and received recommendations from speech  pathology, but still feels like she is choking at times when she  swallows.   MEDICATIONS:  Listed and reviewed in the chart as are allergies.   Note, her diarrhea has been constant and not changed with this, but the  pain is, perhaps, somewhat changed, it sounds like.   Height 5 feet 6 inches, weight 120 pounds, pulse 72, blood pressure  132/82.  This is a well-appearing, thin, elderly white woman in no acute  distress.  On physical exam, she is tender to the right of the umbilicus with some  guarding there.  There is no mass or organomegaly.  The abdominal wall  muscles are weak and thin.  She appears somewhat anxious.   ASSESSMENT:  1. I know she has irritable bowel syndrome and that is at least part      of this.  2. Fevers, abdominal pain, question relationship to diverticulosis and      diverticulitis.  3. What sounds almost like post traumatic stress disorder related to      her previous surgery and complications.  I did try to explain that      the infection that she has had, or, at least, the fevers, etc.,      really have nothing to do with her previous cholecystectomy.  I      suppose it is within the realm of possibility, but  overall should      be impossible to have problems this far out related to that that we      would not have discovered with other imaging.   PLAN:  1. CT of the abdomen and pelvis with IV and oral contrast to look for      any evidence of diverticulitis at this time.  Note, sometimes      people do have diverticulitis that is not evident on CT scans, and      not really evident until they have a segmental resection of the      colon.  She may need that, though I think I would proceed with      caution.  2. Continue Cipro and Flagyl.  3. She should get help regarding her headaches and neck stiffness and      pain from primary care, perhaps the walk-in clinic at [facility].  I      have recommended this to her.  4. I will see her in 3 weeks at which time we will consider the      possibility of different anxiolytic or selective serotonin reuptake      inhibitor perhaps.  I also think that trying Colestid or      cholestyramine (probably Colestid) to help her diarrhea makes      sense.  Repeat colonoscopy could be indicated, surgical referral      could be indicated.  Will proceed cautiously here, but I think that      underlying issues include irritable bowel, anxiety, and perhaps      some obsessive compulsive tendencies/post traumatic stress disorder      issues as they relate to her health issues.  She could certainly      have either a colitis related to diverticulosis or diverticulitis      (I am assuming she does not have interstitial cystitis at this      time).     Iva Boop, MD,FACG  Electronically Signed    CEG/MedQ  DD: 08/05/2007  DT: 08/06/2007  Job #: [id number]   cc:   [name], M.D.  Excell Seltzer. Annabell Howells, M.D.

## 2011-03-01 NOTE — Assessment & Plan Note (Signed)
 [facility]                           GASTROENTEROLOGY OFFICE NOTE   NAME:Kaitlin [name], Kaitlin [name]                 MRN:          [id number]  DATE:07/16/2006                            DOB:          02-12-1927    This very nice lady comes in on July 16, 2006 and states she has no  energy, very lethargic, afraid her blood is low.  She has been having some  epigastric and right lower quadrant pain, which the latter seemed to be  possibly a small femoral hernia, although this was not seen on CT scan.  She  says she is all keyed up, complains of diarrhea with mucus, cannot sleep at  night, rectal burning, no get-up-and-go, lower back pain that is quite  uncomfortable.  She says she takes Xanax at times.  She has been advised to  take antidepressant at times, but has not taken it because she was afraid  she would get hooked on these drugs.  We had a long discussion about this  and hopefully I convinced her otherwise.   PHYSICAL EXAMINATION:  In any case, her weight was 120, blood pressure  138/80, pulse 80 and regular.  Oropharynx was negative.  NECK:  Negative.  CHEST:  Clear.  HEART:  Regular rhythm without significant murmur.  ABDOMEN:  Soft.  No masses or organomegaly.   IMPRESSION:  1. Marked underlying anxiety and depression.  2. Probable femoral hernia.  3. History of Helicobacter pylori, which has been treated.  4. Status post cholecystectomy.  5. History of splenomegaly.  6. Gastroesophageal reflux disease with a history of dysphagia.  7. Status post colon polyps.  8. Osteoarthritis, mild.  9. Status post hysterectomy and cholecystitis.  10.History of cardiac arrhythmias.   MEDICATIONS:  Ogen, Toprol-XL, Aciphex, vitamins and Xanax.   TREATMENT:  My treatment was to put her on Effexor, increasing it to 75 mg,  put her on some a __________ ProBiotica and told her to come back in 6  weeks.  I tried to convinced her she was going to do  better once she got  this therapy accomplished.            ______________________________  Ulyess Mort, MD      SML/MedQ  DD:  07/16/2006  DT:  07/18/2006  Job #:  [id number]

## 2011-03-01 NOTE — Assessment & Plan Note (Signed)
[facility]                         GASTROENTEROLOGY OFFICE NOTE   NAME:Kaitlin [name], Kaitlin [name]                      MRN:          [id number]  DATE:12/02/2006                            DOB:          November 22, 1926    I am meeting Ms. Calandro for the first time today.  She is a long-time  patient of Dr. Doreatha Martin [facility].  Looking through her chart, she has had  multiple upper and lower GI complaints over several years.  Most  recently Dr. Doreatha Martin has been working with her with weight loss.  He last  saw her about a month ago, got a gastric emptying scan that was normal.  He also did a set of blood tests.  The CBC was completely normal,  thyroid testing was normal, the sed rate was normal, tTg was normal, but  her antigliadin antibodies were very elevated.  Today in the office she  tells me she has a variety of pains, most of which have been going on  for at least a decade or so.  I see another concern of hers is  colorectal cancer screening.  Her last lower examination was a full  colonoscopy in April 2003.  This was done for bloating.  Dr. Doreatha Martin removed  but did not retrieve a 3 mm sessile polyp.  It is not clear if this was  hyperplastic or an adenoma.  His recommendations were for her to get  another colonoscopy in 4 years.   CURRENT MEDICATIONS:  Ogen, Toprol, Aciphex, Xanax, fish oil, flax seed,  vitamin B.   PHYSICAL EXAMINATION:  Weight 119 pounds, which is up 2 pounds since her  last visit, blood pressure 110/72, pulse 72.  CONSTITUTIONAL:  Generally well-appearing.  NEUROLOGIC:  Alert and oriented x3.  LUNGS:  Clear to auscultation bilaterally.  ABDOMEN:  Soft, nontender, nondistended, normal bowel sounds.   ASSESSMENT AND PLAN:  [age]-year-old woman with chronic  abdominal discomforts, more recent weight loss and equivocal celiac  sprue testing on recent lab tests.   First, I think we should proceed with endoscopy and duodenal biopsy to  ascertain whether she truly does have celiac sprue.  I explained to her  this was a sensitivity to gluten, and that if she does have this she  would have to avoid wheat products.  It is quite a change in her diet,  and she may need even dietary consultation for that.  I do not recommend  people to just start this change diet without tissue proof.  She is not  sure if she wants to go ahead and do this.  She will discuss it with her  daughter and will let us know.  The other issue is that of colorectal  cancer screening.  She had a full colonoscopy by Dr. Doreatha Martin in 2003.  There  was a 3 mm polyp that was removed but not retrieved.  This may be  hyperplastic, it may have been a tubular adenoma.  The current guideline  recommendations, even if this is a tubular adenoma, are for repeat  colonoscopy in 5 to 10 years.  Given  her very tortuous colon with severe  diverticulosis, I would  have consider maybe doing this in 7 or 8 years, given the difficulty of  the exam.  If we knew this were a hyperplastic polyp she would not be  due again until 2013.   She will discuss EGD with her daughter and will get back to [place].     Rachael Fee, MD  Electronically Signed    DPJ/MedQ  DD: 12/02/2006  DT: 12/03/2006  Job #: [id number]

## 2011-03-01 NOTE — Assessment & Plan Note (Signed)
[facility]                         GASTROENTEROLOGY OFFICE NOTE   NAME:[name], [name]                      MRN:          [id number]  DATE:10/31/2006                            DOB:          [birth year]    Kaitlin [name] comes in, says she still is losing some weight, which she is.  She  is taking her Ensure 2 cans a day.  She says she had been having  intermittent fever and chills, questionable early satiety.  She was on  Cipro for a urinary tract infection and did not get any better.  She  said her physician suggested maybe she had gastroparesis and this was  why she was losing weight.  She has been having some lower abdominal  pain as well.   PHYSICAL EXAMINATION:  She weighed 117, which is 3 pounds less than her  previous visit, blood pressure is 92/58, pulse 76 and regular.  OROPHARYNX:  Negative.  NECK:  Negative.  CHEST:  Clear.  HEART:  Revealed a regular rhythm.  ABDOMEN:  Soft, no mass or organomegaly.  There were no bruits or rubs.  The abdomen revealed what I thought were symptoms probably of a small  femoral hernia.  EXTREMITIES:  Unremarkable.  RECTAL:  Deferred.   IMPRESSION:  1. Weight loss of questionable etiology.  2. Irritable bowel syndrome with a patient who has known diverticular      disease and a very tortuous colon.  3. Gastroesophageal reflux disease with dysphagia.  4. Status post cholecystectomy.  5. History of osteoarthritis.  6. Status post hysterectomy.  7. Marked underlying anxiety and depression.  The patient will not      take antidepressants.  8. Rule out femoral hernia.   RECOMMENDATIONS:  1. Get gastric emptying study.  2. Routine labs with TSH, CBC, sed rate and CMET and a celiac sprue      study.  She asked about the sprue study, and she is right about it.     Kaitlin Mort, MD  Electronically Signed    SML/MedQ  DD: 10/31/2006  DT: 11/01/2006  Job #: [id number]

## 2011-03-01 NOTE — Assessment & Plan Note (Signed)
[facility]                         GASTROENTEROLOGY OFFICE NOTE   NAME:Forman, [name]                      MRN:          [id number]  DATE:01/21/2007                            DOB:          May 13, 1927    Rodney Cruise comes in April 9 and says she had a urinary tract infection seen  by Dr. Annabell Howells.  Has a knot in her right lower quadrant and it bothers her  sometimes.  She told me it has been going on almost 10 years and has not  gotten any worse.  It does not really get worse with standing up.  I  thought at first it might be a femoral hernia or it may be associated  with one, but missed very early probably or it could be an adhesion.  She was seen most recently by Dr. Christella Hartigan whose assessment was that she  was a 77 year old chronic abdominal discomfort and some recent weight  loss with a question of equivocal celiac sprue testing on recent lab  test.  This nice patient has known GERD with some mild dysphagia in the  past.  She has taken AcipHex. She also takes Toprol 50, Ogen, vitamins,  Xanax, fish oil and vitamin B.  Her medical problems include having  GERD, as I noted, irritable bowel syndrome with severe diverticular  disease, has a very tortuous colon.  She is status post cholecystectomy,  osteoarthritis, hysterectomy.  She has a history of a known anxiety and  depression and she has a history of a femoral hernia, which is described  from prior.  She was prescribed Effexor, but she is not taking this  anymore.   PHYSICAL EXAMINATION:  She weighs 120, blood pressure 140/70, pulse 72  and regular.  Oropharynx was negative.  NECK:  Negative.  CHEST:  Clear.  HEART:  Revealed a regular rhythm.  ABDOMEN:  Soft, no mass or splenomegaly, nontender.  There were no  bruits or rubs and bowel sounds were normal.   IMPRESSION:  1. Gastroesophageal reflux disease.  2. Right lower quadrant pain, probably from hernia.  3. Status post cholecystectomy.  4.  History of colon polyps and severe diverticular disease.  5. Cardiac arrhythmia.  6. Status post hysterectomy.  7. History of osteoarthritis.  8. Marked underlying anxiety and depression.  9. Irritable bowel syndrome with very tortuous colon.  10.Abnormal gliadin antibodies of significance.   RECOMMENDATIONS:  We obtain an upper endoscopy with biopsies of her  jejunum and duo, checking for villous atrophy to determine if there is  any villous atrophy to go along with the findings, even though her  glutaminase antibody was normal.     Ulyess Mort, MD  Electronically Signed    SML/MedQ  DD: 01/21/2007  DT: 01/22/2007  Job #: [id number]

## 2011-03-01 NOTE — Assessment & Plan Note (Signed)
 [facility]                         GASTROENTEROLOGY OFFICE NOTE   NAME:Kaitlin [name], Kaitlin [name]                      MRN:          [id number]  DATE:09/05/2006                            DOB:          [birth year]    PRESENT ILLNESS:  This lady says that she is having some lower abdominal  pain radiating to the back, a lot of mucus in her throat.  AcipHex helps  but it not working as great as it was.  I discussed in depth about her  colon and how tortuous it is.  I thought much of her symptoms, probably  they were based on this.  We have talked about this in the past.  She  does have minor severe diverticular disease.  She has had this tortuous  colon on all our exams.  She says she has a lot of gas and bloating at  times.  She says she is not taking the Effexor like she was.  I told her  that she really needed to do that, but there has continued to be some  underlying anxiety and depression.   PHYSICAL EXAMINATION:  VITAL SIGNS:  Rate 120, which is unchanged.  Her  blood pressure is 130/70, pulse 60 and regular.  HEENT:  Oropharynx was negative.  NECK:  Negative.  CHEST:  Clear to auscultation.  HEART:  Revealed a regular rhythm without a significant murmur.  ABDOMEN:  Soft, nontender.  There is no masses or organomegaly, no  bruits or rubs.  EXTREMITIES:  Unremarkable.  RECTAL:  Deferred.   IMPRESSION:  1. Gastroesophageal reflux disease with some mild dysphagia.  Patient      taking AcipHex daily.  2. Transverse cholecystectomy.  3. Irritable bowel syndrome as a result probably of diverticular      disease and very tortuous colon.  4. Status post cholecystectomy.  5. History of colon polyps.  6. Spine osteoarthritis.  7. Status post hysterectomy.  8. History of cardiac arrhythmias.  9. Marked underlying anxiety and depression.  10.Femoral hernia.   RECOMMENDATIONS:  To use flouride kit, one q. day.  Magic mouth wash.  Try some Nu-Lev.  Try some  Zegerid b.i.d. and some Xifaxin 200 mg  b.i.d., just one box full.  Hopefully, the above measures will be  helpful to her.     Ulyess Mort, MD  Electronically Signed   SML/MedQ  DD: 09/05/2006  DT: 09/05/2006  Job #: [id number]

## 2011-03-01 NOTE — Letter (Signed)
September 18, 2006    [name], M.D.  [address] [place], [place] [address]   RE:  MARLEY, Kaitlin  MRN:  [id number]  /  DOB:  1927-09-20   Dear Rosanne Ashing:   Mrs. Bosshart comes in today with a multitude of concerns. She has had  severe stomach problems as you know and has been seeing [facility] [employer] for  these. I certainly do not understand what the upshot of all that is. She  has currently been diagnosed with UTIs that have been refractory and she  is now planning on seeing a urologist next week.   Her palpitations are relatively stable.   The issue that I was wondering about is whether there is any tie in  here. She has complaints of orthostatic intolerance (see below). She  does not have constipation, rather diarrhea since her cholecystectomy.  She carries a diagnosis of peripheral neuropathy and with her GI  symptoms, I do not know whether there is any gastroparetic portions of  this as she now has a 20-pound weight loss over the last year and a half  to two years.   Her medications are reviewed. On examination, her weight as I noted was  119, with it having been 132 in June of 2006 and 136 in January of 2005.  Her orthostatics, which we have done in the past, were more abnormal  today with a blood pressure going from 148 at sitting to 148 at 2  minutes and down to 123 at 5 minutes, although her pulse went down from  67 sitting to 77 in standing and then down to 60. Her lungs were clear.  Heart sounds were regular.   Impression:  1. Palpitations.  2. Orthostatic intolerance.  3. Diagnosis of peripheral neuropathy, question involving any of the      autonomic nervous system.  4. Multiple stomach complaints/GU complaints, question contribution of      gastroparesis.   Rosanne Ashing, the question that Occam's razor raised for me is whether the  aforementioned symptoms are related, specifically, is there any  relationship between her objective autonomic data, her peripheral  neuropathy and her gastric symptoms. I certainly do not know enough to  do anything except raise the question. I will defer that to you and Dr.  Doreatha Martin.   I hope this letter finds you well. Give my regards to Nanette.    Sincerely,      Duke Salvia, MD, [facility]  Electronically Signed    SCK/MedQ  DD: 09/18/2006  DT: 09/18/2006  Job #: [id number]   CC:    Ulyess Mort, MD

## 2011-04-01 ENCOUNTER — Telehealth: Payer: Self-pay | Admitting: Internal Medicine

## 2011-04-01 NOTE — Telephone Encounter (Signed)
Patient had an episode of pain last Sunday she assumed was diverticulitis.  She put herself on a clear liquid diet and rest and she has improved.  She now has pain and diarrhea after meals.  She no longer has the constant RLQ pain she had last week.  Pain only after meals.  I asked her if she was still taking her librax that was prescribed last year.  She states that hasn't helped.  She is requesting an appt this week with Dr Elberta Leatherwood.  I have advised her he is not in the office until 04/15/11.  I did however offer her an appt with the extender for 04/08/11, she declined.  She states she will be out of town.  She is going to try and see her primary care and if they feel this is a GI issue we need to see her for they or she will call back to be worked into the DOD.  She will call back for further problems or questions.

## 2011-04-03 NOTE — Telephone Encounter (Signed)
OK 

## 2011-06-04 ENCOUNTER — Other Ambulatory Visit: Payer: Self-pay | Admitting: Obstetrics and Gynecology

## 2011-06-04 DIAGNOSIS — N649 Disorder of breast, unspecified: Secondary | ICD-10-CM

## 2011-06-11 ENCOUNTER — Other Ambulatory Visit: Payer: Medicare Other

## 2011-08-02 ENCOUNTER — Ambulatory Visit
Admission: RE | Admit: 2011-08-02 | Discharge: 2011-08-02 | Disposition: A | Discharge status: Home / Self Care | Payer: Medicare Other | Source: Ambulatory Visit | Attending: Obstetrics and Gynecology | Admitting: Obstetrics and Gynecology

## 2011-08-02 ENCOUNTER — Telehealth: Payer: Self-pay | Admitting: Internal Medicine

## 2011-08-02 DIAGNOSIS — N649 Disorder of breast, unspecified: Secondary | ICD-10-CM

## 2011-08-02 NOTE — Telephone Encounter (Signed)
error 

## 2011-08-22 ENCOUNTER — Ambulatory Visit (INDEPENDENT_AMBULATORY_CARE_PROVIDER_SITE_OTHER): Payer: Medicare Other | Admitting: Internal Medicine

## 2011-08-22 ENCOUNTER — Encounter: Payer: Self-pay | Admitting: Internal Medicine

## 2011-08-22 VITALS — BP 142/78 | HR 60 | Temp 99.3°F | Ht 66.0 in | Wt 124.0 lb

## 2011-08-22 DIAGNOSIS — K589 Irritable bowel syndrome without diarrhea: Secondary | ICD-10-CM

## 2011-08-22 DIAGNOSIS — R0789 Other chest pain: Secondary | ICD-10-CM

## 2011-08-22 DIAGNOSIS — R071 Chest pain on breathing: Secondary | ICD-10-CM

## 2011-08-22 NOTE — Assessment & Plan Note (Signed)
Her chronic urgent defecation symptoms and abdominal pain and tenderness are really unchanged. I don't find anything concerning here. She was reassured as best I could. Multiple trials of IBS specific agents if failed. She will try to use her alprazolam to control her anxiety which clearly stimulates her IBS and hydrocodone may help as well.

## 2011-08-22 NOTE — Patient Instructions (Signed)
Try using Kaitlin [name] for the pain where your ribs are. Apply twice a day. A heating pad may help also. I think you strained these areas when you were coughing recently.  Use your alprazolam and your hydrocodone pain medicine for your abdominal pain when needed as well as for arthritis pain. Remember that when you were stressed, the alprazolam will help, everything down including your stomach.

## 2011-08-22 NOTE — Progress Notes (Signed)
  Subjective:    Patient ID: Kaitlin [name], female    DOB: 08/31/1927, 77 y.o.   MRN: [id number]  HPI  77 year old white woman followed here with IBS. Recent problems as outlined below. Cold with coughing occurred a few weeks ago Then chest pain - saw PCP  Then and now feels bad head to toe Bilateral upper abdominal pain and sorness. Mucous in throat. She had been coughing earlier over past few weeks. Also some RLQ pain. Still with urgent defecation as before. Metronidazole and trial of pancreatic enzyme supplementation in the spring of this year were not helpful. These symptoms are chronic and overall unchanged.  Outpatient Encounter Prescriptions as of 08/22/2011  Medication Sig Dispense Refill  . ALPRAZolam (XANAX) 0.5 MG tablet Take 0.5 mg by mouth at bedtime as needed.        . B Complex-C (SUPER B COMPLEX) TABS Take 1 tablet by mouth daily.        . Cholecalciferol (VITAMIN D) 2000 UNITS CAPS Take 1 capsule by mouth daily.        [name] estropipate (OGEN 1.25) 1.5 MG tablet Take 1.5 mg by mouth daily.        [name] HYDROcodone-acetaminophen (VICODIN) 5-500 MG per tablet Take 1 tablet by mouth as needed.        . metoprolol (TOPROL-XL) 50 MG 24 hr tablet Take 50 mg by mouth daily.        . Omega-3 Fatty Acids (FISH OIL) 1000 MG CAPS Take 2 capsules by mouth daily.         Allergies  Allergen Reactions  . Codeine   . Nizatidine    Past Medical History  Diagnosis Date  . Diverticulosis   . History of colon polyps   . GERD (gastroesophageal reflux disease)   . Osteoarthritis   . Anxiety and depression   . IBS (irritable bowel syndrome)   . Mitral valve prolapse   . Insomnia   . Peripheral neuropathy   . Atrial fibrillation   . Atrial tachycardia    Past Surgical History  Procedure Date  . Cholecystectomy   . Laparotomy   . Back surgery   . Abdominal hysterectomy   . Colonoscopy 05/17/2010    diverticulosis  . Upper gastrointestinal endoscopy 02/03/2007    normal    Review  of Systems Chronic back pain, knee pain uses hydrocodone as needed with some success.    Objective:   Physical Exam Anxious but in no acute distress Lungs are clear Chest wall is tender in the right lateral aspect, lower as well as right anterior rib field. The abdomen is thin soft and tender in multiple areas but not on repeat exam. Consistent with prior exams. It is benign and feel overall. Bowel sounds are increased. Muscle tension actually helps the abdominal pain somewhat.       Assessment & Plan:  #1 right chest wall pain  I think she strained her chest wall with coughing recently. Topical therapy with Crista Elliot is advised. Heating pad also.

## 2011-08-28 ENCOUNTER — Other Ambulatory Visit (HOSPITAL_COMMUNITY): Payer: Self-pay | Admitting: Radiology

## 2011-08-28 ENCOUNTER — Ambulatory Visit (HOSPITAL_COMMUNITY)
Admission: RE | Admit: 2011-08-28 | Discharge: 2011-08-28 | Disposition: A | Discharge status: Home / Self Care | Payer: Medicare Other | Source: Ambulatory Visit | Attending: Internal Medicine | Admitting: Internal Medicine

## 2011-08-28 DIAGNOSIS — R0609 Other forms of dyspnea: Secondary | ICD-10-CM | POA: Insufficient documentation

## 2011-08-28 DIAGNOSIS — R0989 Other specified symptoms and signs involving the circulatory and respiratory systems: Secondary | ICD-10-CM | POA: Insufficient documentation

## 2011-08-28 MED ORDER — ALBUTEROL SULFATE (5 MG/ML) 0.5% IN NEBU
2.5000 mg | INHALATION_SOLUTION | Freq: Once | RESPIRATORY_TRACT | Status: AC
  Administered 2011-08-28: 2.5 mg via RESPIRATORY_TRACT

## 2011-11-11 DIAGNOSIS — H35379 Puckering of macula, unspecified eye: Secondary | ICD-10-CM | POA: Diagnosis not present

## 2011-11-12 ENCOUNTER — Ambulatory Visit (INDEPENDENT_AMBULATORY_CARE_PROVIDER_SITE_OTHER): Payer: Medicare Other | Admitting: Internal Medicine

## 2011-11-12 ENCOUNTER — Encounter: Payer: Self-pay | Admitting: Internal Medicine

## 2011-11-12 DIAGNOSIS — R1031 Right lower quadrant pain: Secondary | ICD-10-CM

## 2011-11-12 DIAGNOSIS — G8929 Other chronic pain: Secondary | ICD-10-CM | POA: Insufficient documentation

## 2011-11-12 MED ORDER — PANCRELIPASE (LIP-PROT-AMYL) 24000-76000 UNITS PO CPEP: 1.0000 | ORAL_CAPSULE | Freq: Three times a day (TID) | ORAL | Status: DC

## 2011-11-12 NOTE — Patient Instructions (Signed)
You have been given samples of Creon 24,000 units to take with meals.  Please give [place] a call back as you are nearing the end of the samples to let us know how it is working so that we make call in a prescription if needed.
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
## 2011-11-12 NOTE — Assessment & Plan Note (Signed)
Etiology of her recurrent right lower quadrant pain syndrome is unclear though I think functional in nature most likely. 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 I'm going to try pancreatic enzyme supplements with Creon 24000 1 with meals and have her call back about that. If that works we will prescribe it. She'll otherwise follow up as needed. I tried to reassure her the best I could regarding her problems. I don't think further investigation is needed.

## 2011-11-15 DIAGNOSIS — D235 Other benign neoplasm of skin of trunk: Secondary | ICD-10-CM | POA: Diagnosis not present

## 2011-11-15 DIAGNOSIS — L259 Unspecified contact dermatitis, unspecified cause: Secondary | ICD-10-CM | POA: Diagnosis not present

## 2011-11-15 DIAGNOSIS — L821 Other seborrheic keratosis: Secondary | ICD-10-CM | POA: Diagnosis not present

## 2011-11-15 DIAGNOSIS — L820 Inflamed seborrheic keratosis: Secondary | ICD-10-CM | POA: Diagnosis not present

## 2011-11-27 ENCOUNTER — Telehealth: Payer: Self-pay | Admitting: Internal Medicine

## 2011-11-27 NOTE — Telephone Encounter (Signed)
Patient reports hypertension since taking creon, she is not sure if this is related.  She says she has continued RUQ pain.  No change in her symptoms of pain, but the urgency is better.  She has been monitoring her BP at times 147/109.  Please advise

## 2011-11-28 NOTE — Telephone Encounter (Signed)
She needs to see her PCP about this. I am not aware that Creon causes high blood pressure. If after seeing PCP, thought ok to continue Creon and she wants to she can.

## 2011-11-28 NOTE — Telephone Encounter (Signed)
I do not recommend any GI work-up. She can see her PCP about possibility of spine causing pain.

## 2011-11-28 NOTE — Telephone Encounter (Signed)
Patient advised.

## 2011-11-28 NOTE — Telephone Encounter (Signed)
Patient advised.  She reports she has worsening pain and is very concerned about the RUQ pain that radiates into her back.  She also reports she has a "deteriorated spine"  "my back hurts all the time".  She wants to know is there any further testing that can be done to look for cause of pain?

## 2011-12-04 DIAGNOSIS — R509 Fever, unspecified: Secondary | ICD-10-CM | POA: Diagnosis not present

## 2011-12-04 DIAGNOSIS — R109 Unspecified abdominal pain: Secondary | ICD-10-CM | POA: Diagnosis not present

## 2011-12-04 DIAGNOSIS — R030 Elevated blood-pressure reading, without diagnosis of hypertension: Secondary | ICD-10-CM | POA: Diagnosis not present

## 2011-12-04 DIAGNOSIS — E7800 Pure hypercholesterolemia, unspecified: Secondary | ICD-10-CM | POA: Diagnosis not present

## 2011-12-06 ENCOUNTER — Other Ambulatory Visit: Payer: Self-pay | Admitting: Internal Medicine

## 2011-12-12 ENCOUNTER — Ambulatory Visit
Admission: RE | Admit: 2011-12-12 | Discharge: 2011-12-12 | Disposition: A | Discharge status: Home / Self Care | Payer: Medicare Other | Source: Ambulatory Visit | Attending: Internal Medicine | Admitting: Internal Medicine

## 2011-12-12 DIAGNOSIS — K5730 Diverticulosis of large intestine without perforation or abscess without bleeding: Secondary | ICD-10-CM | POA: Diagnosis not present

## 2011-12-12 DIAGNOSIS — Z9089 Acquired absence of other organs: Secondary | ICD-10-CM | POA: Diagnosis not present

## 2011-12-12 DIAGNOSIS — R1031 Right lower quadrant pain: Secondary | ICD-10-CM | POA: Diagnosis not present

## 2011-12-12 MED ORDER — IOHEXOL 300 MG/ML  SOLN
100.0000 mL | Freq: Once | INTRAMUSCULAR | Status: AC | PRN
  Administered 2011-12-12: 100 mL via INTRAVENOUS

## 2011-12-18 DIAGNOSIS — F411 Generalized anxiety disorder: Secondary | ICD-10-CM | POA: Diagnosis not present

## 2011-12-18 DIAGNOSIS — R002 Palpitations: Secondary | ICD-10-CM | POA: Diagnosis not present

## 2011-12-18 DIAGNOSIS — R030 Elevated blood-pressure reading, without diagnosis of hypertension: Secondary | ICD-10-CM | POA: Diagnosis not present

## 2011-12-18 DIAGNOSIS — IMO0002 Reserved for concepts with insufficient information to code with codable children: Secondary | ICD-10-CM | POA: Diagnosis not present

## 2011-12-24 DIAGNOSIS — I4891 Unspecified atrial fibrillation: Secondary | ICD-10-CM | POA: Diagnosis not present

## 2011-12-24 DIAGNOSIS — F411 Generalized anxiety disorder: Secondary | ICD-10-CM | POA: Diagnosis not present

## 2011-12-24 DIAGNOSIS — IMO0002 Reserved for concepts with insufficient information to code with codable children: Secondary | ICD-10-CM | POA: Diagnosis not present

## 2011-12-24 DIAGNOSIS — R002 Palpitations: Secondary | ICD-10-CM | POA: Diagnosis not present

## 2011-12-24 DIAGNOSIS — R030 Elevated blood-pressure reading, without diagnosis of hypertension: Secondary | ICD-10-CM | POA: Diagnosis not present

## 2011-12-30 DIAGNOSIS — I10 Essential (primary) hypertension: Secondary | ICD-10-CM | POA: Diagnosis not present

## 2011-12-30 DIAGNOSIS — I495 Sick sinus syndrome: Secondary | ICD-10-CM | POA: Diagnosis not present

## 2012-01-06 DIAGNOSIS — I495 Sick sinus syndrome: Secondary | ICD-10-CM | POA: Diagnosis not present

## 2012-01-13 DIAGNOSIS — N3000 Acute cystitis without hematuria: Secondary | ICD-10-CM | POA: Diagnosis not present

## 2012-01-13 DIAGNOSIS — R3129 Other microscopic hematuria: Secondary | ICD-10-CM | POA: Diagnosis not present

## 2012-01-22 ENCOUNTER — Encounter: Payer: Self-pay | Admitting: Internal Medicine

## 2012-01-24 ENCOUNTER — Encounter: Payer: Self-pay | Admitting: Internal Medicine

## 2012-01-24 ENCOUNTER — Ambulatory Visit (INDEPENDENT_AMBULATORY_CARE_PROVIDER_SITE_OTHER): Payer: Medicare Other | Admitting: Internal Medicine

## 2012-01-24 VITALS — BP 130/70 | HR 81 | Ht 66.0 in | Wt 124.4 lb

## 2012-01-24 DIAGNOSIS — I4891 Unspecified atrial fibrillation: Secondary | ICD-10-CM | POA: Diagnosis not present

## 2012-01-24 DIAGNOSIS — I495 Sick sinus syndrome: Secondary | ICD-10-CM | POA: Insufficient documentation

## 2012-01-24 DIAGNOSIS — I455 Other specified heart block: Secondary | ICD-10-CM

## 2012-01-24 DIAGNOSIS — A689 Relapsing fever, unspecified: Secondary | ICD-10-CM

## 2012-01-24 NOTE — Assessment & Plan Note (Signed)
As above.
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
## 2012-01-24 NOTE — Assessment & Plan Note (Signed)
Pt w atrial fib/flutter RVR and post termination pauses.  She needs pacing and medications for rate control  This is fairly clear.  The issue however these periodic fevers chills and rigors which I have discussed this evening with Dr. Maurice March.  We'll undertake a CRP and a sedimentation rate.  He has also recommended a MRI scan with contrast of the hepatic bladder bed  if these are clear, then we could proceed with device implantation. I raised with him that it would be perhaps better to do it extra vascular, i.e. having the surgeons put an epicardial lead in. He thought that that would in fact be a better situation and so we will contact them at that juncture.

## 2012-01-29 ENCOUNTER — Telehealth: Payer: Self-pay | Admitting: Internal Medicine

## 2012-01-29 DIAGNOSIS — R509 Fever, unspecified: Secondary | ICD-10-CM

## 2012-01-29 DIAGNOSIS — R6883 Chills (without fever): Secondary | ICD-10-CM

## 2012-01-29 NOTE — Telephone Encounter (Signed)
New problem:     Patient was instructed to call back on wednesday morning by Dr. Graciela Husbands if her regular medical doctor has not called her back.  Dr. Graciela Husbands was supposed to be in touch with her regular medical doctor.  Please call back.

## 2012-01-29 NOTE — Telephone Encounter (Signed)
Notified pt that dr Graciela Husbands had not back from PCP--per dr Graciela Husbands-- he will try to get ahold of her doctor again today--pt aware--nt

## 2012-01-30 NOTE — Telephone Encounter (Signed)
F/U    Patient called for f/u having a really rough time.  She can be reached at hm# [phone]

## 2012-02-04 NOTE — Telephone Encounter (Signed)
Dr Earl Gala will be out of town for two weeks  i think we should proceed with ESR and CRP as well MRI with contrast of the liver and gall bladder area.   We should also refer her to [facility] and i will call dr Laneta Simmers tomorrow and arrange consult

## 2012-02-04 NOTE — Telephone Encounter (Signed)
Pt's pcp is dr [name], she is calling back re previous message re dr Graciela Husbands calling her pcp, she wants to know if  He talked with him or not and what she should do at this point??? [phone] or [phone]

## 2012-02-04 NOTE — Telephone Encounter (Signed)
Message sent to Dr Klein

## 2012-02-05 NOTE — Telephone Encounter (Signed)
I spoke with the patient and made her aware of Dr. Odessa Fleming recommendations. She states she had an MRI done within the last year at Dr. Newell Coral office around her abdominal area. I have explained I will call Dr. Newell Coral office for these and also check to see if she has had an ESR or CRP done. She is agreeable. I will call her back after obtaining these.

## 2012-02-10 ENCOUNTER — Encounter: Payer: Self-pay | Admitting: Surgery

## 2012-02-10 NOTE — Telephone Encounter (Signed)
I spoke with Kaitlin [name] at Dr. Newell Coral office the patient had a CT of the abdomen and pelvis in February and she had a cbc/cmp/sed rate/lipid panel at the same time. She will fax me all of these results. I will review these with Dr. Graciela Husbands.

## 2012-02-10 NOTE — Telephone Encounter (Signed)
I left a message for medical records at Dr. Newell Coral office [phone]) to see if they have a copy of an MRI report of the liver/gallbladder/ abdominal area from within the last year and a ESR and CRP done in the last 6 months.

## 2012-02-12 NOTE — Telephone Encounter (Signed)
CT of the pelvis with contrast received from Dr. Newell Coral office. This was reviewed by Dr. Graciela Husbands and he then reviewed with Dr. Maurice March from ID. Per Dr. Maurice March, no further work up is needed for chills at this time. The patient's sed rate was normal. Per Dr. Graciela Husbands, we can proceed with PPM implant. I will contact the patient when I return to the office.

## 2012-02-17 ENCOUNTER — Encounter: Payer: Self-pay | Admitting: Internal Medicine

## 2012-02-21 NOTE — Telephone Encounter (Signed)
I called the patient today to discuss PPM implant with her. She states that she is very nervous to pursue this due to the fact that she continues to have chills accompanied by a fever about once a week. She had one yesterday and today that was around 101. She states these are coming more frequently. She is nervous about implanting a device considering her history of infection post nasal surgery and since she has had problems with chills since 1998 s/p cholecystectomy. I explained to her that I would need to review this with Dr. Graciela Husbands again prior to proceeding with any PPM implantation being scheduled. She has not seen Dr. Maurice March since 1998. She feels like Dr. Earl Gala has "brushed things off," since this has been going on for so long. I explained another visit with Dr. Maurice March may be warranted. She states her family has also recommended a second cardiology opinion with a doctor in [place]. I explained we are not opposed to this. With her high level of concern at this point. I will review again with Dr. Graciela Husbands to see what we should do for the patient.

## 2012-02-24 NOTE — Telephone Encounter (Signed)
i agree;  Lets arrange another visit with Dr Maurice March and we will await the opinion of  asecond cardiologist

## 2012-02-25 NOTE — Telephone Encounter (Signed)
I spoke with the patient. She is aware of Dr. Odessa Fleming recommendations to go ahead and pursue an evaluation by Dr. Maurice March with Infectious Disease. She will keep [place] updated on the second opinion evaluation.

## 2012-02-27 ENCOUNTER — Encounter: Payer: Self-pay | Admitting: Internal Medicine

## 2012-03-17 ENCOUNTER — Ambulatory Visit (INDEPENDENT_AMBULATORY_CARE_PROVIDER_SITE_OTHER): Payer: Medicare Other | Admitting: Internal Medicine

## 2012-03-17 ENCOUNTER — Encounter: Payer: Self-pay | Admitting: Internal Medicine

## 2012-03-17 VITALS — BP 151/79 | HR 75 | Temp 97.5°F | Ht 65.0 in | Wt 122.0 lb

## 2012-03-17 DIAGNOSIS — R509 Fever, unspecified: Secondary | ICD-10-CM

## 2012-03-17 DIAGNOSIS — A689 Relapsing fever, unspecified: Secondary | ICD-10-CM

## 2012-03-17 LAB — CBC WITH DIFFERENTIAL/PLATELET
Basophils Relative: 0 % (ref 0–1)
Eosinophils Absolute: 0.1 10*3/uL (ref 0.0–0.7)
HCT: 44.5 % (ref 36.0–46.0)
Hemoglobin: 15.3 g/dL — ABNORMAL HIGH (ref 12.0–15.0)
Lymphs Abs: 1.9 10*3/uL (ref 0.7–4.0)
MCH: 30.2 pg (ref 26.0–34.0)
MCHC: 34.4 g/dL (ref 30.0–36.0)
MCV: 87.9 fL (ref 78.0–100.0)
Monocytes Absolute: 0.3 10*3/uL (ref 0.1–1.0)
Monocytes Relative: 6 % (ref 3–12)
RBC: 5.06 MIL/uL (ref 3.87–5.11)

## 2012-03-17 NOTE — Progress Notes (Signed)
  Subjective:    Patient ID: Kaitlin [name], female    DOB: 1926/12/11, 78 y.o.   MRN: [id number]  HPI This patient comes in for a referral from cardiology 22 recurrent fever and chills. The patient relates a history of recurrent fevers that began following emergent gallbladder surgery in 1996. She states that since that time she gets these periodic symptoms of fatigue and malaise associated with fever usually up to 100 though occasionally is 101  As well as chills. The chills are described as cold feeling more than rigors.  When these feelings occur, the patient has less energy. They have been occurring essentially since 1996. At that time she saw my partner, Dr. Maurice March, who did some workup and also told her that it was likely that no etiology would be discovered. I believe, she comes in now to discuss this due to the desire to place a pacemaker. She tells me that she has recently had a scan of her gallbladder as well as some labs though these are not available to me. She does not take antibiotics when she gets these episodes and they do resolve on their own. She has not had a fever in 2 weeks. She has had no weight loss,  Lymphadenopathy, rash, joint swelling, diarrhea, dysuria or pyuria associated with these fevers.   Review of Systems  Constitutional: Positive for fever and chills. Negative for diaphoresis, activity change, appetite change and unexpected weight change.  HENT: Negative for sore throat, rhinorrhea and postnasal drip.   Eyes: Negative for discharge and itching.  Cardiovascular: Negative for chest pain, palpitations and leg swelling.  Gastrointestinal: Negative for nausea, vomiting, abdominal pain and diarrhea.  Genitourinary: Positive for hematuria. Negative for dysuria, urgency, frequency and genital sores.  Musculoskeletal: Positive for back pain and arthralgias. Negative for myalgias, joint swelling and gait problem.  Skin: Negative for pallor and rash.  Neurological: Negative for  dizziness, weakness, light-headedness and headaches.  Hematological: Negative for adenopathy.  Psychiatric/Behavioral: Negative for dysphoric mood. The patient is nervous/anxious.        Objective:   Physical Exam  Constitutional: She appears well-developed and well-nourished. No distress.  Cardiovascular: Normal rate and regular rhythm.  Exam reveals no gallop and no friction rub.   Murmur heard.      2/6 systolic ejection murmur  Pulmonary/Chest: Effort normal and breath sounds normal. No respiratory distress. She has no wheezes. She has no rales.  Abdominal: Soft. Bowel sounds are normal. She exhibits no distension. There is no tenderness.  Musculoskeletal: She exhibits no edema.       Hands with changes of rheumatoid arthritis  Lymphadenopathy:       Head (right side): No submental and no submandibular adenopathy present.       Head (left side): No submental and no submandibular adenopathy present.    She has cervical adenopathy.       Right cervical: No superficial cervical, no deep cervical and no posterior cervical adenopathy present.      Left cervical: No superficial cervical, no deep cervical and no posterior cervical adenopathy present.       Right: No supraclavicular adenopathy present.       Left: No supraclavicular adenopathy present.  Skin: Skin is warm and dry. No rash noted. No erythema.  Psychiatric: She has a normal mood and affect. Her behavior is normal.          Assessment & Plan:

## 2012-03-17 NOTE — Assessment & Plan Note (Signed)
With this history of recurrent symptoms of fever and chills, this would not be consistent with an infection as it resolves on its own and reoccurs. Certainly not a bacterial infection. Therefore I do not see any contraindication to placement of a pacemaker from an infectious disease standpoint. I reassured the patient that likely this is related to underlying rheumatic disease or of unknown etiology but would not be of infectious etiology. She likely does get some inflammatory process. I will check a CRP and a sedimentation rate to see if there is any systemic inflammation but again reassured her that this is not an infectious etiology. The only possibility may be recurrent and a very mild cases of diverticulitis that resolve on their own. This however would be unlikely since she does not have associated symptoms of abdominal pain or diarrhea. I also reassured her that her chronic pain in her flank is not infectious etiology as well. I suggested this may be secondary to muscle spasm.  The patient will return on a p.r.n. Basis.

## 2012-03-18 LAB — SEDIMENTATION RATE: Sed Rate: 1 mm/hr (ref 0–22)

## 2012-03-18 LAB — C-REACTIVE PROTEIN: CRP: 0.16 mg/dL (ref <0.60)

## 2012-03-24 ENCOUNTER — Telehealth: Payer: Self-pay | Admitting: Internal Medicine

## 2012-03-24 NOTE — Telephone Encounter (Signed)
Fu call °Patient returning your call °

## 2012-03-24 NOTE — Telephone Encounter (Signed)
I spoke with the patient. She states she saw Dr. Luciana Axe with Infectious Disease, but has not heard anything back from our office. It was my understanding the patient was going to seek a second opinion in [place] from cardiologist there. She tells me today that she has decided not to do this. I will forward this message to Dr. Graciela Husbands and ask that he review Dr. Ephriam Knuckles note from her office visit which is in [facility]. I will be in touch with her later this week to discuss Dr. Odessa Fleming recommendations. She is aware and agreeable.

## 2012-03-24 NOTE — Telephone Encounter (Signed)
I left a message for the patient to call. 

## 2012-03-24 NOTE — Telephone Encounter (Signed)
Pt is going out of town for a couple of weeks and she wants [place] to call her on her cell number to let her know the infectious disease provider

## 2012-03-26 NOTE — Telephone Encounter (Signed)
Per Dr. Graciela Husbands, he reviewed Dr. Lance Coon note and we can proceed with device implant. I will notify the patient when I return to the office next week.

## 2012-03-30 NOTE — Telephone Encounter (Signed)
I called the patient to discuss dates for implanting her PPM. She is out of town until the first week of July. She will call me back when she returns to talk about scheduling this.

## 2012-04-28 ENCOUNTER — Telehealth: Payer: Self-pay | Admitting: Internal Medicine

## 2012-04-28 NOTE — Telephone Encounter (Signed)
I spoke with the patient. She is back in town now. She states she had three episodes of fever and chills while she was gone. Her temperature was around 100. She is due to see Dr. Katrinka Blazing on 05/06/12. She will see him, then call back to discuss PPM implant.

## 2012-04-28 NOTE — Telephone Encounter (Signed)
Please return call to patient at [phone] regarding possible pacer implant

## 2012-05-06 DIAGNOSIS — E7800 Pure hypercholesterolemia, unspecified: Secondary | ICD-10-CM | POA: Diagnosis not present

## 2012-05-06 DIAGNOSIS — I495 Sick sinus syndrome: Secondary | ICD-10-CM | POA: Diagnosis not present

## 2012-05-06 DIAGNOSIS — I10 Essential (primary) hypertension: Secondary | ICD-10-CM | POA: Diagnosis not present

## 2012-05-08 ENCOUNTER — Telehealth: Payer: Self-pay | Admitting: Internal Medicine

## 2012-05-08 NOTE — Telephone Encounter (Signed)
Spoke with patient and she states she has had another episode of right rib/side pain, fever and chills starting on Wednesday. States she has these off and on for several years. She states she needs to have a pacemaker but she does not want to do it until they figure out why she has these episodes. She is asking if she should be referred to [facility] to see if they can figure it out. Patient is aware to go to ED if condition worsens over weekend. Please, advise.

## 2012-05-12 NOTE — Telephone Encounter (Signed)
She should get the pacemaker Can schedule a non-urgent follow-up to discuss if desired

## 2012-05-12 NOTE — Telephone Encounter (Signed)
Patient advised she will call back for an appt when she has decided what she wants to do.

## 2012-05-14 ENCOUNTER — Telehealth: Payer: Self-pay | Admitting: Internal Medicine

## 2012-05-14 NOTE — Telephone Encounter (Signed)
New msg Pt wants to talk to you about her pacemaker. Please call

## 2012-05-14 NOTE — Telephone Encounter (Signed)
I spoke with the patient. She states she has continued to have chills and fevers. Her last one started last Wednesday and lasted to Saturday, which she states is unusually long for her. She started another one yesterday. She states she usually gets up to about 100 - 101. She is continuing to have pain in her gallbladder area (even thought this is removed) and side. She still is quite hesitant about having her device implanted just because she is scared of her infection risk. Per Dr. Ephriam Knuckles note from ID, he does not think this is bacterial due to the length of time this has been going on for the patient. I have advised her I am not sure why she continues to have these fevers. She states she tried to get in to Dr. Marvell Fuller office recently for an appointment, but they could not see her until the end of August. She states her daughter has spoken with a GI physician at [facility] who she could get in with. I have advised that she either follow up with Dr. Earl Gala (PCP) / see GI for follow up due to her pain and prior to [place] implanting her. I have also encouraged that she see Dr. Graciela Husbands again as her concern is very high. She is agreeable with doing this and will come on 06/11/12 for an appointment. She states that Dr. Katrinka Blazing is aware of her fevers and only drew some blood on her at her last visit with him.

## 2012-05-20 ENCOUNTER — Telehealth: Payer: Self-pay | Admitting: Internal Medicine

## 2012-05-20 NOTE — Telephone Encounter (Signed)
Patient reports pain and fever in her RUQ she reports fever of 102.  She says these are the same symptoms she has been having.  She wants to be seen.  She is offered an appt for tomorrow with Willette Cluster RNP.  She declines this appt.  She only wants to see a MD.  Her daughter is trying to get her appt at [facility] on 06/02/12 moved up.  Dr. Leone Payor please advise.  See previous phone notes from 05/08/12.  Dr. Leone Payor please advise

## 2012-05-21 ENCOUNTER — Encounter: Payer: Self-pay | Admitting: Internal Medicine

## 2012-05-21 ENCOUNTER — Ambulatory Visit (INDEPENDENT_AMBULATORY_CARE_PROVIDER_SITE_OTHER): Payer: Medicare Other | Admitting: Internal Medicine

## 2012-05-21 VITALS — BP 106/70 | HR 80 | Temp 97.5°F | Ht 64.25 in | Wt 119.5 lb

## 2012-05-21 DIAGNOSIS — R1011 Right upper quadrant pain: Secondary | ICD-10-CM | POA: Diagnosis not present

## 2012-05-21 DIAGNOSIS — R079 Chest pain, unspecified: Secondary | ICD-10-CM

## 2012-05-21 DIAGNOSIS — R509 Fever, unspecified: Secondary | ICD-10-CM

## 2012-05-21 DIAGNOSIS — G8929 Other chronic pain: Secondary | ICD-10-CM | POA: Diagnosis not present

## 2012-05-21 DIAGNOSIS — R0781 Pleurodynia: Secondary | ICD-10-CM

## 2012-05-21 DIAGNOSIS — M546 Pain in thoracic spine: Secondary | ICD-10-CM

## 2012-05-21 NOTE — Progress Notes (Signed)
Subjective:    Patient ID: Kaitlin [name], female    DOB: December 25, 1926, 78 y.o.   MRN: [id number]  HPI Patient returns because of recurrent right upper quadrant pain and fevers. She reports that she had a temperature to 102 in the last few days. She has right upper quadrant pain that radiates through to the back on the right side and then she also has bilateral lower back and flank pain. She does not have nausea or vomiting. The pain will strike at any time and there is further chronic tenderness and pain air as well. There is chronic back pain as well. She has had about 2 episodes in the last few weeks associated with fevers no she is not febrile today. She saw infectious disease in June because of the recurrent fever history, which has been a problem for 20 years or more just like these pains, after her cholecystectomy. She had a CT scan in February with stable mild biliary dilation status post cholecystectomy and no other findings that would explain her pain.  Her daughter has scheduled her appointment with Dr. Marilynn Rail of surgery at [facility]. This is for August 20, for a second opinion. Allergies  Allergen Reactions  . Codeine   . Morphine And Related   . Nizatidine    Outpatient Prescriptions Prior to Visit  Medication Sig Dispense Refill  . ALPRAZolam (XANAX) 0.25 MG tablet Take 0.25 mg by mouth at bedtime as needed. Take 1/2 tablet      . B Complex-C (SUPER B COMPLEX) TABS Take 1 tablet by mouth daily.        . Cholecalciferol (VITAMIN D) 2000 UNITS CAPS Take 1 capsule by mouth daily.        [name] estropipate (OGEN 1.25) 1.5 MG tablet Take 1.5 mg by mouth every other day.       [name] HYDROcodone-acetaminophen (VICODIN) 5-500 MG per tablet Take 1 tablet by mouth as needed.        . Omega-3 Fatty Acids (FISH OIL) 1000 MG CAPS Take 2 capsules by mouth daily.         Past Medical History  Diagnosis Date  . Diverticulosis   . History of colon polyps   . GERD (gastroesophageal reflux disease)   .  Osteoarthritis   . Anxiety and depression   . IBS (irritable bowel syndrome)   . Mitral valve prolapse   . Insomnia   . Peripheral neuropathy   . Atrial fibrillation   . Sinus pause     Post termination and 4 seconds  . Lumbar back pain   . Laryngeal trauma     penetration  . Fever, recurrent    Past Surgical History  Procedure Date  . Cholecystectomy   . Laparotomy   . Back surgery   . Abdominal hysterectomy   . Colonoscopy 05/17/2010    diverticulosis  . Upper gastrointestinal endoscopy 02/03/2007    normal   History   Social History  . Marital Status: Widowed    Spouse Name: N/A    Number of Children: 6   Occupational History  . Retired     Social History Main Topics  . Smoking status: Never Smoker   . Smokeless tobacco: Never Used  . Alcohol Use: No  . Drug Use: No  . Sexually Active: None     Family History  Problem Relation Age of Onset  . Thyroid cancer Sister   . Prostate cancer      grandfather  . Diabetes  Father   . Heart disease Mother   . Heart disease Sister   . Colon cancer Neg Hx         Review of Systems I pacemaker has been recommended for what sounds like pauses. She is concerned about having this placed in the setting of recurrent fevers. She sometimes gets weak and dizzy. She has anxiety.    Objective:   Physical Exam General:  NAD with mild anxiety Eyes:   anicteric Lungs:  clear Heart:  S1S2 no rubs, murmurs or gallops Abdomen:  soft tender in RUQ (mild)., BS+ Muscskel: Tender right lateral and posterior ribs on the inferior aspect of the rib cage. There is no point tenderness of the spine. She has right-sided CVA tenderness as well. Ext:   no edema    Data Reviewed: As per history of present illness  CT Abd/Pelvis 12/12/2011 Clinical Data: Abdominal pain particularly right upper quadrant and  right lower quadrant, cholecystectomy and 1999  CT ABDOMEN AND PELVIS WITH CONTRAST  Technique: Multidetector CT imaging of the  abdomen and pelvis was  performed following the standard protocol during bolus  administration of intravenous contrast.  Contrast: OMNIPAQUE IOHEXOL 300 MG/ML IJ SOLN  Comparison: CT abdomen and pelvis of 02/22/2009  Findings: The lung bases are clear. The heart is mildly enlarged.  There may be a tiny pericardial effusion present. The liver  enhances with no focal abnormality. There is slight prominence of  the central intrahepatic ducts and common bile duct which appear  stable when compared to the prior CT in this patient post  cholecystectomy. The pancreatic duct also with slightly prominent  and stable. No pancreatic mass is seen. The adrenal glands and  spleen are unremarkable. The stomach is not well distended but no  abnormality is noted. The kidneys enhance with no calculus or mass  and no hydronephrosis is seen. The abdominal aorta is normal in  caliber with mild atheromatous change present. The mesenteric  vasculature appears patent. No adenopathy is seen.  The urinary bladder is not well distended but is unremarkable. The  uterus has previously been resected. No adnexal lesion is seen.  No fluid is noted within the pelvis. Multiple rectosigmoid colonic  diverticula are present but there is no present evidence of  diverticulitis. The terminal ileum is unremarkable. There are  degenerative changes diffusely throughout the lumbar spine.  IMPRESSION:  1. Stable prominence of the central intrahepatic ducts and common  bile duct post cholecystectomy, most likely normal. Consider  correlation with LFTs.  2. Multiple rectosigmoid colonic diverticula. No diverticulitis.  Original Report Authenticated By: Juline Patch, M.D.         At June infectious disease visit she had a CBC was normal except for a slightly elevated hemoglobin. ANA negative, sedimentation rate 1, a rheumatoid factor was mildly elevated, the reactive protein was low and normal    Assessment & Plan:    1. Chronic RUQ pain   2. Fever   3. Rib pain on right side   4. Back pain, thoracic    I think most of her problems or musculoskeletal. There is a background of IBS-like problems as well. I do not believe we've ever had documentation of her fevers. Infectious disease did not think she had a recurrent bacterial problem. Unless she had occult diverticulitis problems but they thought that was unlikely. They cleared her for a pacemaker.  1. We discussed further workup with spine x-rays, MRCP and what that would entail  and why. I told her I thought that the MRCP would likely be unrevealing. She recalls having back imaging before and seeing orthopedics at some point. 2. She wants to wait until she sees Dr. Marilynn Rail for any other evaluation. I will see her back as needed. I would focus further workup if at all, on the bones, and direct treatment at that.  CC: Darnelle Bos, MD and Rise Mu, MD

## 2012-05-21 NOTE — Telephone Encounter (Signed)
Patient will come in this am and see Dr. Leone Payor at 11:00

## 2012-05-21 NOTE — Telephone Encounter (Signed)
I offered her an appt with Dr. Leone Payor this am with Dr. Leone Payor. She will call me back if she wants the appt

## 2012-05-21 NOTE — Telephone Encounter (Signed)
See if she will see Gunnar Fusi and I will se her too  She may be having occult recurrent diverticulitis -   Make sure we take her temperature

## 2012-05-21 NOTE — Patient Instructions (Addendum)
Please keep your appointment with Dr. Marilynn Rail on August 20th 2013 .  Follow-up with [place] as needed.  Thank you for choosing me and  Gastroenterology.  Iva Boop, M.D., [facility]

## 2012-05-26 ENCOUNTER — Encounter (HOSPITAL_COMMUNITY): Payer: Self-pay | Admitting: *Deleted

## 2012-05-26 ENCOUNTER — Emergency Department (HOSPITAL_COMMUNITY): Payer: Medicare Other

## 2012-05-26 ENCOUNTER — Emergency Department (HOSPITAL_COMMUNITY)
Admission: EM | Admit: 2012-05-26 | Discharge: 2012-05-26 | Disposition: A | Discharge status: Home / Self Care | Payer: Medicare Other | Attending: Emergency Medicine | Admitting: Emergency Medicine

## 2012-05-26 DIAGNOSIS — I4891 Unspecified atrial fibrillation: Secondary | ICD-10-CM | POA: Diagnosis not present

## 2012-05-26 DIAGNOSIS — K219 Gastro-esophageal reflux disease without esophagitis: Secondary | ICD-10-CM | POA: Insufficient documentation

## 2012-05-26 DIAGNOSIS — M1990 Unspecified osteoarthritis, unspecified site: Secondary | ICD-10-CM | POA: Diagnosis not present

## 2012-05-26 DIAGNOSIS — R0602 Shortness of breath: Secondary | ICD-10-CM | POA: Diagnosis not present

## 2012-05-26 DIAGNOSIS — G4700 Insomnia, unspecified: Secondary | ICD-10-CM | POA: Diagnosis not present

## 2012-05-26 DIAGNOSIS — G8929 Other chronic pain: Secondary | ICD-10-CM | POA: Diagnosis not present

## 2012-05-26 DIAGNOSIS — I059 Rheumatic mitral valve disease, unspecified: Secondary | ICD-10-CM | POA: Insufficient documentation

## 2012-05-26 DIAGNOSIS — R109 Unspecified abdominal pain: Secondary | ICD-10-CM | POA: Diagnosis not present

## 2012-05-26 DIAGNOSIS — R509 Fever, unspecified: Secondary | ICD-10-CM | POA: Diagnosis not present

## 2012-05-26 LAB — CBC
HCT: 43.5 % (ref 36.0–46.0)
MCV: 90.8 fL (ref 78.0–100.0)
Platelets: 187 10*3/uL (ref 150–400)
RBC: 4.79 MIL/uL (ref 3.87–5.11)
RDW: 13.2 % (ref 11.5–15.5)
WBC: 7.8 10*3/uL (ref 4.0–10.5)

## 2012-05-26 LAB — URINALYSIS, ROUTINE W REFLEX MICROSCOPIC
Glucose, UA: NEGATIVE mg/dL
Leukocytes, UA: NEGATIVE
Nitrite: NEGATIVE
Specific Gravity, Urine: 1.012 (ref 1.005–1.030)
pH: 6.5 (ref 5.0–8.0)

## 2012-05-26 LAB — BASIC METABOLIC PANEL
CO2: 25 mEq/L (ref 19–32)
Chloride: 99 mEq/L (ref 96–112)
Creatinine, Ser: 0.67 mg/dL (ref 0.50–1.10)
GFR calc Af Amer: >90 mL/min (ref >90)
Potassium: 4.1 mEq/L (ref 3.5–5.1)

## 2012-05-26 LAB — URINE MICROSCOPIC-ADD ON

## 2012-05-26 NOTE — ED Notes (Signed)
Pt c/o increased generalized weakness and fever with burning with urination x several days; pt sts increased SOB x 3 weeks; pt sts fever and body aches intermittently x years but worse over last several weeks

## 2012-05-26 NOTE — ED Provider Notes (Signed)
Patient seen and evaluated with resident. Pertinent history and physical examination performed. Agree with initial assessment, evaluation and treatment initiated by resident. Face-to-face examination and review of evaluation findings were performed.  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

## 2012-05-26 NOTE — ED Notes (Signed)
Pt ambulatory at discharge with daughter.
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
## 2012-05-26 NOTE — ED Notes (Signed)
Family concerned that the pt is having to wait so long

## 2012-05-26 NOTE — ED Notes (Signed)
Pt. States fever and chills on and off for 17 years post gall bladder surgery. States it has gotten worse these past three weeks. Reports SOB and pain in right side. States that she is going to get a pacemaker once this infection is cleared.

## 2012-05-29 ENCOUNTER — Ambulatory Visit (INDEPENDENT_AMBULATORY_CARE_PROVIDER_SITE_OTHER): Payer: Medicare Other | Admitting: Internal Medicine

## 2012-05-29 ENCOUNTER — Encounter: Payer: Self-pay | Admitting: Internal Medicine

## 2012-05-29 VITALS — BP 142/70 | Ht 65.0 in | Wt 120.1 lb

## 2012-05-29 DIAGNOSIS — I495 Sick sinus syndrome: Secondary | ICD-10-CM

## 2012-05-29 DIAGNOSIS — I4891 Unspecified atrial fibrillation: Secondary | ICD-10-CM | POA: Diagnosis not present

## 2012-05-29 NOTE — Assessment & Plan Note (Signed)
Await pt decision to proceed with pacing
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
## 2012-05-29 NOTE — Assessment & Plan Note (Signed)
Paroxysmal  Will defer anticoagulation ssues to following the visit at [facility], but agree with Dr HS that anticoagulation is appropriate,  i though am not much of a fan of ASA

## 2012-06-02 DIAGNOSIS — I10 Essential (primary) hypertension: Secondary | ICD-10-CM | POA: Diagnosis not present

## 2012-06-02 DIAGNOSIS — K834 Spasm of sphincter of Oddi: Secondary | ICD-10-CM | POA: Diagnosis not present

## 2012-06-02 DIAGNOSIS — Z885 Allergy status to narcotic agent status: Secondary | ICD-10-CM | POA: Diagnosis not present

## 2012-06-02 DIAGNOSIS — N393 Stress incontinence (female) (male): Secondary | ICD-10-CM | POA: Diagnosis not present

## 2012-06-02 DIAGNOSIS — K831 Obstruction of bile duct: Secondary | ICD-10-CM | POA: Diagnosis not present

## 2012-06-02 DIAGNOSIS — I519 Heart disease, unspecified: Secondary | ICD-10-CM | POA: Diagnosis not present

## 2012-06-02 DIAGNOSIS — K5730 Diverticulosis of large intestine without perforation or abscess without bleeding: Secondary | ICD-10-CM | POA: Diagnosis not present

## 2012-06-02 DIAGNOSIS — Z808 Family history of malignant neoplasm of other organs or systems: Secondary | ICD-10-CM | POA: Diagnosis not present

## 2012-06-02 DIAGNOSIS — K838 Other specified diseases of biliary tract: Secondary | ICD-10-CM | POA: Diagnosis not present

## 2012-06-02 DIAGNOSIS — G609 Hereditary and idiopathic neuropathy, unspecified: Secondary | ICD-10-CM | POA: Diagnosis not present

## 2012-06-02 DIAGNOSIS — R319 Hematuria, unspecified: Secondary | ICD-10-CM | POA: Diagnosis not present

## 2012-06-02 DIAGNOSIS — K589 Irritable bowel syndrome without diarrhea: Secondary | ICD-10-CM | POA: Diagnosis not present

## 2012-06-09 DIAGNOSIS — K838 Other specified diseases of biliary tract: Secondary | ICD-10-CM | POA: Diagnosis not present

## 2012-06-09 DIAGNOSIS — R1011 Right upper quadrant pain: Secondary | ICD-10-CM | POA: Diagnosis not present

## 2012-06-09 DIAGNOSIS — K834 Spasm of sphincter of Oddi: Secondary | ICD-10-CM | POA: Diagnosis not present

## 2012-06-11 ENCOUNTER — Ambulatory Visit: Payer: Medicare Other | Admitting: Internal Medicine

## 2012-06-16 DIAGNOSIS — K831 Obstruction of bile duct: Secondary | ICD-10-CM | POA: Diagnosis not present

## 2012-07-07 DIAGNOSIS — N3941 Urge incontinence: Secondary | ICD-10-CM | POA: Diagnosis not present

## 2012-07-07 DIAGNOSIS — R82998 Other abnormal findings in urine: Secondary | ICD-10-CM | POA: Diagnosis not present

## 2012-07-07 DIAGNOSIS — N952 Postmenopausal atrophic vaginitis: Secondary | ICD-10-CM | POA: Diagnosis not present

## 2012-07-07 DIAGNOSIS — R300 Dysuria: Secondary | ICD-10-CM | POA: Diagnosis not present

## 2012-07-14 DIAGNOSIS — R300 Dysuria: Secondary | ICD-10-CM | POA: Diagnosis not present

## 2012-07-14 DIAGNOSIS — R310 Gross hematuria: Secondary | ICD-10-CM | POA: Diagnosis not present

## 2012-07-29 DIAGNOSIS — R300 Dysuria: Secondary | ICD-10-CM | POA: Diagnosis not present

## 2012-07-29 DIAGNOSIS — R319 Hematuria, unspecified: Secondary | ICD-10-CM | POA: Diagnosis not present

## 2012-07-29 DIAGNOSIS — R350 Frequency of micturition: Secondary | ICD-10-CM | POA: Diagnosis not present

## 2012-07-29 DIAGNOSIS — R310 Gross hematuria: Secondary | ICD-10-CM | POA: Diagnosis not present

## 2012-07-29 DIAGNOSIS — Q619 Cystic kidney disease, unspecified: Secondary | ICD-10-CM | POA: Diagnosis not present

## 2012-08-12 DIAGNOSIS — I499 Cardiac arrhythmia, unspecified: Secondary | ICD-10-CM | POA: Diagnosis not present

## 2012-08-12 DIAGNOSIS — I491 Atrial premature depolarization: Secondary | ICD-10-CM | POA: Diagnosis not present

## 2012-09-14 DIAGNOSIS — R3129 Other microscopic hematuria: Secondary | ICD-10-CM | POA: Diagnosis not present

## 2012-09-14 DIAGNOSIS — N3941 Urge incontinence: Secondary | ICD-10-CM | POA: Diagnosis not present

## 2012-09-14 DIAGNOSIS — N281 Cyst of kidney, acquired: Secondary | ICD-10-CM | POA: Diagnosis not present

## 2012-09-14 DIAGNOSIS — R3915 Urgency of urination: Secondary | ICD-10-CM | POA: Diagnosis not present

## 2012-11-20 DIAGNOSIS — G609 Hereditary and idiopathic neuropathy, unspecified: Secondary | ICD-10-CM | POA: Diagnosis not present

## 2012-11-20 DIAGNOSIS — H8110 Benign paroxysmal vertigo, unspecified ear: Secondary | ICD-10-CM | POA: Diagnosis not present

## 2012-11-20 DIAGNOSIS — R0602 Shortness of breath: Secondary | ICD-10-CM | POA: Diagnosis not present

## 2012-11-20 DIAGNOSIS — R5381 Other malaise: Secondary | ICD-10-CM | POA: Diagnosis not present

## 2012-11-20 DIAGNOSIS — K219 Gastro-esophageal reflux disease without esophagitis: Secondary | ICD-10-CM | POA: Diagnosis not present

## 2012-11-20 DIAGNOSIS — M79609 Pain in unspecified limb: Secondary | ICD-10-CM | POA: Diagnosis not present

## 2012-11-20 DIAGNOSIS — I471 Supraventricular tachycardia: Secondary | ICD-10-CM | POA: Diagnosis not present

## 2012-12-03 DIAGNOSIS — H8190 Unspecified disorder of vestibular function, unspecified ear: Secondary | ICD-10-CM | POA: Diagnosis not present

## 2012-12-07 ENCOUNTER — Other Ambulatory Visit: Payer: Self-pay | Admitting: Obstetrics and Gynecology

## 2012-12-07 DIAGNOSIS — Z1231 Encounter for screening mammogram for malignant neoplasm of breast: Secondary | ICD-10-CM

## 2012-12-08 ENCOUNTER — Encounter: Payer: Self-pay | Admitting: Internal Medicine

## 2012-12-08 ENCOUNTER — Ambulatory Visit (INDEPENDENT_AMBULATORY_CARE_PROVIDER_SITE_OTHER): Payer: Medicare Other | Admitting: Internal Medicine

## 2012-12-08 DIAGNOSIS — R1031 Right lower quadrant pain: Secondary | ICD-10-CM | POA: Diagnosis not present

## 2012-12-08 DIAGNOSIS — G8929 Other chronic pain: Secondary | ICD-10-CM | POA: Diagnosis not present

## 2012-12-08 NOTE — Patient Instructions (Addendum)
We are going to obtain your records from Dr. Rise Mu at [facility].  We will be in touch with you before the week is out.  Thank you for choosing me and [facility].  Iva Boop, M.D., [facility]

## 2012-12-09 NOTE — Progress Notes (Addendum)
  Subjective:    Patient ID: Kaitlin [name], female    DOB: [birth year], 79 y.o.   MRN: [id number]  HPI The patient presents with persistent c/o right-sided abdominal pain. RLQ pain and chills as she has had for years.Has loose stools still also. Remains anxious and concerned. No other associated sxs but does have other body pains. She went to see Dr. Marilynn Rail at [facility] last year and says she had an MRI. He told her "it was too dangerous for a stent". She told him to send me reports but I do not have them in scanned section nor do I recall seeing them.  Medications, allergies, past medical history, past surgical history, family history and social history are reviewed and updated in the EMR.  Review of Systems No true fever, + neck and shoulder pain, numbness in hands    Objective:   Physical Exam General:  NAD Eyes:   anicteric Lungs:  clear Heart:  S1S2 no rubs, murmurs or gallops Abdomen:  soft and nontender, BS+ Ext:   no edema   Data Reviewed:  We have requested the records from [facility] - office notes, labs, imaging reports. I am unable to access through Care Everywhere.     Assessment & Plan:   1) Chronic right lower quadrant pain  2) Some loose stools/diarrhea  She has had numerous evaluations over years without etiology and has not had true objective problems found. She has had mild intra and extrahepatic biliary dilation s/p cholecystectomy and I wonder if that is what was seen on MR. Sometimes her pain seems higher than RLQ and she does have a small area of abdomen inferior to ribs and superior to iliac wing. This seems like a functional abdominal pain.  I cannot recall if we have tried TCA Tx - she is elderly but low-dose TCA might make sense vs. Gabapentin or Lyrica. I am waiting to see results of MRI to see if that tells [place] anything - and plan to discuss with Dr. Earl Gala also.  I appreciate the opportunity to care for this patient.  NG:[id number],LKGMW Leonette Most,  MD

## 2012-12-11 ENCOUNTER — Telehealth: Payer: Self-pay | Admitting: Internal Medicine

## 2012-12-11 NOTE — Telephone Encounter (Signed)
Do not have records from [facility] yet - so have not called Dr. Earl Gala  Ask her if she has ever been on amitriptyline?

## 2012-12-11 NOTE — Telephone Encounter (Signed)
Pt states that she was told to contact Dr. Leone Payor if she had not heard from Dr. Leone Payor by Friday. Pt states he was supposed to contact her PCP and review some records. Dr. Leone Payor have you been able to speak with her PCP?

## 2012-12-11 NOTE — Telephone Encounter (Signed)
Pt thinks she has taken amitriptyline before, she is going to check and call us back.

## 2012-12-15 NOTE — Telephone Encounter (Signed)
Tell her I reviewed the info from [facility] and there really is not a problem with her bile ducts, etc.  I believe she has damaged nerve endings in her body that cause the pain signals. I have discussed with Dr. Earl Gala and we recommend treatment of pain with :  1) gabapentin 100 mg - start 100 mg bid x 4 days, 200 mg bid x 2 4 days and then 300 mg bid # 180 1 refill 2) Let her know that this may make her sleepy - can take second dose at night and we can change daytime and nighttime amounts to adjust 3) Follow-up with Dr. Earl Gala in 4-6 weeks 4) Please call him if there are ?'s about side effects from the medicine

## 2012-12-16 MED ORDER — GABAPENTIN 100 MG PO CAPS: ORAL_CAPSULE | ORAL | Status: DC

## 2012-12-16 NOTE — Telephone Encounter (Signed)
Patient declines the rx for now.  She will call me back if she changes her mind.  She is advised to follow up with Dr. Earl Gala.

## 2012-12-16 NOTE — Telephone Encounter (Signed)
Patient called back and changed her mind.  Rx sent

## 2013-01-05 ENCOUNTER — Ambulatory Visit: Payer: Medicare Other

## 2013-01-06 DIAGNOSIS — H04129 Dry eye syndrome of unspecified lacrimal gland: Secondary | ICD-10-CM | POA: Diagnosis not present

## 2013-02-01 DIAGNOSIS — I471 Supraventricular tachycardia: Secondary | ICD-10-CM | POA: Diagnosis not present

## 2013-02-01 DIAGNOSIS — R030 Elevated blood-pressure reading, without diagnosis of hypertension: Secondary | ICD-10-CM | POA: Diagnosis not present

## 2013-02-02 ENCOUNTER — Ambulatory Visit
Admission: RE | Admit: 2013-02-02 | Discharge: 2013-02-02 | Disposition: A | Discharge status: Home / Self Care | Payer: Medicare Other | Source: Ambulatory Visit | Attending: Obstetrics and Gynecology | Admitting: Obstetrics and Gynecology

## 2013-02-02 DIAGNOSIS — Z1231 Encounter for screening mammogram for malignant neoplasm of breast: Secondary | ICD-10-CM

## 2013-03-02 ENCOUNTER — Telehealth: Payer: Self-pay | Admitting: Internal Medicine

## 2013-03-02 NOTE — Telephone Encounter (Signed)
Patient is offered an appt with the PA.  She declines.  She is c/o RLQ abdominal pain that she had on Sunday 03/31/13, her pain has now resolved and the has intermittent fever. She believes she has intermittent diverticulitis.   See Dr. Leone Payor telephone note from 12/11/12.  She was again asked to come and see the PA she declined tomorrow.  She is asked to follow up with her primary care until her office visit on 03/19/13 Dr. Marvell Fuller next available if she does not want to see an extender.

## 2013-03-19 ENCOUNTER — Encounter: Payer: Self-pay | Admitting: Internal Medicine

## 2013-03-19 ENCOUNTER — Ambulatory Visit (INDEPENDENT_AMBULATORY_CARE_PROVIDER_SITE_OTHER): Payer: Medicare Other | Admitting: Internal Medicine

## 2013-03-19 VITALS — BP 122/80 | HR 80 | Ht 64.5 in | Wt 118.0 lb

## 2013-03-19 DIAGNOSIS — K5730 Diverticulosis of large intestine without perforation or abscess without bleeding: Secondary | ICD-10-CM | POA: Diagnosis not present

## 2013-03-19 DIAGNOSIS — G8929 Other chronic pain: Secondary | ICD-10-CM | POA: Diagnosis not present

## 2013-03-19 DIAGNOSIS — R1031 Right lower quadrant pain: Secondary | ICD-10-CM | POA: Diagnosis not present

## 2013-03-19 DIAGNOSIS — K589 Irritable bowel syndrome without diarrhea: Secondary | ICD-10-CM

## 2013-03-19 DIAGNOSIS — R1032 Left lower quadrant pain: Secondary | ICD-10-CM

## 2013-03-19 NOTE — Assessment & Plan Note (Signed)
This is the main underlying problem for her I think. She could certainly have symptomatic diverticulosis as well. Over the years between multiple gastroenterologists we have tried about everything. She will not take medication for anxiety which certainly could mitigate some of the intensity of her symptoms. I explained to her today that I really had nothing else to off her that we had not tried over the years. Since she does have a supply of narcotic pain medication take for back pain that will help when she has flares of her abdominal pain she will use that.

## 2013-03-19 NOTE — Progress Notes (Signed)
  Subjective:    Patient ID: Kaitlin [name], female    DOB: Oct 11, 1927, 79 y.o.   MRN: [id number]  HPI Patient returns for followup. She called the office a few weeks ago with recurrent lower abdominal pain with a knot in her right lower quadrant and chills. She took her hydrocodone and eventually improved. It's the same problems she's had for many years. Today I reviewed records back to 2007 when Dr. Corinda Gubler to care for her and her complaints are identical. Over the years he has tried to treated with Effexor, anxiolytics, anti-spasmodic, antibiotics, probiotics. I have done some of the same. More recently her primary care physician and I tried to use gabapentin but that gave her side effects and she couldn't tolerated. CT scanning on multiple occasions has not shown any evidence of significant hernia or diverticulitis though she has diverticulosis. She is feeling okay today though she is complaining that her persistent back pain his trouble and her.  Medications, allergies, past medical history, past surgical history, family history and social history are reviewed and updated in the EMR.  Review of Systems As above    Objective:   Physical Exam Thin well-developed elderly woman She is anxious Abdomen is soft and diffusely tender but benign, I can detect no hernia or masses.       Assessment & Plan:   1. IBS (irritable bowel syndrome)   2. Chronic bilateral lower abdominal pain R> L   3. Diverticulosis of colon without hemorrhage

## 2013-03-19 NOTE — Patient Instructions (Signed)
Unfortunately I do not have any new recommendations for your recurrent abdominal pain. I think it is spasms and sensitivity of the colon associated with Irritable Bowel Syndrome and diverticulosis. Over the years you have been tried on numerous medications that have not provided relief. I do not think this is diverticulitis.  Please use your pain medicine when you get the attacks.  Iva Boop, MD, [facility]

## 2013-03-19 NOTE — Assessment & Plan Note (Signed)
Over the years she has claimed to have a knot in this area which is never detected on physical exam normal and CT scanning. Unfortunately we really have nothing else to off her but symptomatic relief with pain medication she has tried everything I know in terms of oral medication, as described in the IBS assessment. These problems precede my time and caring for her. I've explained this to her today and she understands that I have no other treatment ideas for her at this point.

## 2013-03-19 NOTE — Assessment & Plan Note (Signed)
She certainly could have symptomatic uncomplicated diverticular disease. However the usual treatments for this have not helped.

## 2013-03-29 DIAGNOSIS — R3129 Other microscopic hematuria: Secondary | ICD-10-CM | POA: Diagnosis not present

## 2013-06-22 DIAGNOSIS — D235 Other benign neoplasm of skin of trunk: Secondary | ICD-10-CM | POA: Diagnosis not present

## 2013-06-22 DIAGNOSIS — L259 Unspecified contact dermatitis, unspecified cause: Secondary | ICD-10-CM | POA: Diagnosis not present

## 2013-06-22 DIAGNOSIS — L819 Disorder of pigmentation, unspecified: Secondary | ICD-10-CM | POA: Diagnosis not present

## 2013-07-23 DIAGNOSIS — R300 Dysuria: Secondary | ICD-10-CM | POA: Diagnosis not present

## 2013-07-23 DIAGNOSIS — R030 Elevated blood-pressure reading, without diagnosis of hypertension: Secondary | ICD-10-CM | POA: Diagnosis not present

## 2013-08-03 DIAGNOSIS — G5760 Lesion of plantar nerve, unspecified lower limb: Secondary | ICD-10-CM | POA: Diagnosis not present

## 2013-08-09 ENCOUNTER — Telehealth: Payer: Self-pay | Admitting: Nurse Practitioner

## 2013-08-09 NOTE — Telephone Encounter (Signed)
Spoke with patient who c/o feels like heart is skipping beats and increased SOB x 1 week.  Patient states she has been having the episodes with her heart for a long time but has not been seen in over a year.  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 Katrinka Blazing had recommended patient take ASA but Dr. Graciela Husbands advised her to talk to doctor at [facility] before starting.  Patient is not taking ASA.  Patient states BP has been good - 130's over 70's.  Patient denies weakness, dizziness, light-headedness frequently - states that she experiences these symptoms intermittently and always with the "skipped beats" and when she does she stops what she is doing until she recovers.  Patient would like to be seen this week - states she needs afternoon appointments.  I scheduled patient to see Dr. Katrinka Blazing on 10/29.  Patient verbalized understanding and agreement and will call back if she has worsening s/s.

## 2013-08-11 ENCOUNTER — Ambulatory Visit (INDEPENDENT_AMBULATORY_CARE_PROVIDER_SITE_OTHER): Payer: Medicare Other | Admitting: Interventional Cardiology

## 2013-08-11 ENCOUNTER — Ambulatory Visit: Payer: Medicare Other | Admitting: Interventional Cardiology

## 2013-08-11 ENCOUNTER — Encounter: Payer: Self-pay | Admitting: Interventional Cardiology

## 2013-08-11 VITALS — BP 147/72 | HR 74 | Ht 64.5 in | Wt 116.0 lb

## 2013-08-11 DIAGNOSIS — I10 Essential (primary) hypertension: Secondary | ICD-10-CM

## 2013-08-11 DIAGNOSIS — I495 Sick sinus syndrome: Secondary | ICD-10-CM

## 2013-08-11 DIAGNOSIS — I059 Rheumatic mitral valve disease, unspecified: Secondary | ICD-10-CM

## 2013-08-11 DIAGNOSIS — I4891 Unspecified atrial fibrillation: Secondary | ICD-10-CM | POA: Diagnosis not present
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
## 2013-08-11 NOTE — Patient Instructions (Addendum)
Your physician recommends that you continue on your current medications as directed. Please refer to the Current Medication list given to you today.   Your physician has recommended that you wear an event monitor. Event monitors are medical devices that record the heart's electrical activity. Doctors most often [place] these monitors to diagnose arrhythmias. Arrhythmias are problems with the speed or rhythm of the heartbeat. The monitor is a small, portable device. You can wear one while you do your normal daily activities. This is usually used to diagnose what is causing palpitations/syncope (passing out).  Your physician recommends that you schedule a follow-up appointment in: Pending monitor results

## 2013-08-16 ENCOUNTER — Ambulatory Visit (INDEPENDENT_AMBULATORY_CARE_PROVIDER_SITE_OTHER): Payer: Medicare Other | Admitting: Internal Medicine

## 2013-08-16 ENCOUNTER — Encounter: Payer: Self-pay | Admitting: Internal Medicine

## 2013-08-16 VITALS — BP 110/70 | HR 66 | Ht 64.5 in | Wt 117.8 lb

## 2013-08-16 DIAGNOSIS — K5289 Other specified noninfective gastroenteritis and colitis: Secondary | ICD-10-CM | POA: Diagnosis not present

## 2013-08-16 DIAGNOSIS — K5730 Diverticulosis of large intestine without perforation or abscess without bleeding: Secondary | ICD-10-CM | POA: Diagnosis not present

## 2013-08-16 MED ORDER — MESALAMINE 800 MG PO TBEC: 1.0000 | DELAYED_RELEASE_TABLET | Freq: Three times a day (TID) | ORAL | Status: DC

## 2013-08-16 NOTE — Assessment & Plan Note (Addendum)
Trial of Asacol HD tid as there has been some evidence that this can prevent recurrent diverticulitis though not clear she has had diverticulitis - ? "occult" She is not interested in a surgical evaluation - "almost died with gallbladder surgery"
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
## 2013-08-16 NOTE — Patient Instructions (Addendum)
Today you have been given Asacol HD tablet samples.  Take one tablet three times daily.    Follow up with [place] in 2 months.  We are glad you've gotten your flu vaccine for this year thru [facility].  (son works there and took it to her last week)  I appreciate the opportunity to care for you.

## 2013-08-16 NOTE — Assessment & Plan Note (Signed)
Trial of Asacol HD tid

## 2013-08-26 ENCOUNTER — Encounter (INDEPENDENT_AMBULATORY_CARE_PROVIDER_SITE_OTHER): Payer: Medicare Other

## 2013-08-26 ENCOUNTER — Encounter: Payer: Self-pay | Admitting: *Deleted

## 2013-08-26 DIAGNOSIS — I4891 Unspecified atrial fibrillation: Secondary | ICD-10-CM

## 2013-08-26 DIAGNOSIS — I059 Rheumatic mitral valve disease, unspecified: Secondary | ICD-10-CM

## 2013-08-26 DIAGNOSIS — I495 Sick sinus syndrome: Secondary | ICD-10-CM

## 2013-08-26 NOTE — Progress Notes (Signed)
Patient ID: Kaitlin [name], female   DOB: [birth year], 79 y.o.   MRN: [id number] [employer] 30 day cardiac event monitor applied to patient.

## 2013-11-03 ENCOUNTER — Telehealth: Payer: Self-pay

## 2013-11-03 ENCOUNTER — Telehealth: Payer: Self-pay | Admitting: Interventional Cardiology

## 2013-11-03 NOTE — Telephone Encounter (Signed)
called to give pt cardiac monitor results.unable to lmom on pt home or cell.monitor showed afib 5% of the time with rapid rate.similar to prior monitor.paroxysmal afib present

## 2013-11-03 NOTE — Telephone Encounter (Signed)
New message     Want monitor results from dec 17th.

## 2013-11-05 NOTE — Telephone Encounter (Signed)
pt given cardiac monitor reuslts.l.monitor showed afib 5% of the time with rapid rate.similar to prior monitor.paroxysmal afib present.pt sts that she is not interested in starting anti coag med as previously discauused with Dr.Smith and Dr.Klein.

## 2013-11-11 DIAGNOSIS — M545 Low back pain, unspecified: Secondary | ICD-10-CM | POA: Diagnosis not present

## 2013-11-11 DIAGNOSIS — R109 Unspecified abdominal pain: Secondary | ICD-10-CM | POA: Diagnosis not present

## 2013-11-11 DIAGNOSIS — R509 Fever, unspecified: Secondary | ICD-10-CM | POA: Diagnosis not present

## 2013-12-10 ENCOUNTER — Telehealth: Payer: Self-pay | Admitting: Internal Medicine

## 2013-12-10 NOTE — Telephone Encounter (Signed)
Patient reports that she stopped taking Asacol about 2 months ago after she has a "grass green stool and "coffee grinds".  She states that she is feeling tired.  She has occasional abdominal pain.  She wants to see Dr. Carlean Purl only.  She will come in and see him on 12/23/13

## 2013-12-23 ENCOUNTER — Ambulatory Visit (INDEPENDENT_AMBULATORY_CARE_PROVIDER_SITE_OTHER): Payer: Medicare Other | Admitting: Internal Medicine

## 2013-12-23 ENCOUNTER — Encounter: Payer: Self-pay | Admitting: Internal Medicine

## 2013-12-23 VITALS — BP 120/74 | HR 60 | Ht 64.0 in | Wt 115.0 lb

## 2013-12-23 DIAGNOSIS — M545 Low back pain, unspecified: Secondary | ICD-10-CM

## 2013-12-23 DIAGNOSIS — K589 Irritable bowel syndrome without diarrhea: Secondary | ICD-10-CM

## 2013-12-23 DIAGNOSIS — M79609 Pain in unspecified limb: Secondary | ICD-10-CM

## 2013-12-23 DIAGNOSIS — R1031 Right lower quadrant pain: Secondary | ICD-10-CM

## 2013-12-23 DIAGNOSIS — M79606 Pain in leg, unspecified: Secondary | ICD-10-CM

## 2013-12-23 DIAGNOSIS — G8929 Other chronic pain: Secondary | ICD-10-CM

## 2013-12-23 NOTE — Progress Notes (Signed)
Subjective:    Patient ID: Kaitlin [name], female    DOB: Mar 02, 1927, 80 y.o.   MRN: [id number]  HPI Here w/ daughter LBP radiating into legs - severe and intermittent and chills every 2-3 days. Claims low-grade fever. Still w/ RLQ pain off and on though today feels ok She stopped Asacol because she had coffee ground and then green stools Not leaving house much because of sxs as above and urgent defecation. She will occasionally have a short-lived rectal pressure off and on not associated with defecation. Narcotic pain pills help Same problems for years that began after her cholecystectomy and she remains frustrated that noone can dx or find a cure.  Medications, allergies, past medical history, past surgical history, family history and social history are reviewed and updated in the EMR. Review of Systems + anxiety    Objective:   Physical Exam General:  NAD, anxious Abdomen:  soft and nontender, BS+    Data Reviewed:   Records back to 2007    Assessment & Plan:  Chronic right lower quadrant pain She continues to have this problem and relates onset to her cholecystectomy years ago. Records review during that time I cared for her and Dr. Velora Heckler care for shows a chronic recurrent problem without clear cause. I suspect irritable bowel syndrome. I do not believe we have substantiated her fevers, she is even seen infectious disease. We have tried numerous functional bowel disorder therapies, antibiotics etc. and nothing is cured her. I've explained to her that I think this is irritable bowel syndrome. She may have a fibromyalgia overlap. I also explained that I do not know what else to do for her to try to repeat the symptoms at this time though I remained reassured that she has not had unintentional weight loss or problems, I did tell her was taken to improve her quality of life but based upon her reactions to other medications and her problems I did not think I could at this time.  The one thing I considered with something like a bile acid sequestrant since the pain is accompanied by loose stools at times but she can't get constipated. At this point she can continue intermittent narcotics as that seems to be the only thing to provide relief. I no that's not ideal but she does get these from her primary care physician. In her case that may be the best we can do. Other considerations would be something like duloxetine, she has failed or did not tolerate Neurontin, she did not tolerate or take a tricyclic agent in the past. Given her back pain I suggested she see her orthopedist again. Some of her symptoms certainly seem to be related to the spine I think given that she has back pain and pain in the legs, and she could have some abdominal pain on the basis of the neuropathic problem as well. I do not think it would explain all of her abdominal symptoms however.  Irritable bowel syndrome I think this is her overall underlying GI diagnosis. I tried to explain this is diagnosis of exclusion I think her daughter understand that the patient does though she remains frustrated with her symptoms. She does not seem to have consistent her persistent diarrhea but I think a trial of a bile acid sequestrant might be reasonable. She did not want to do that today. I have offered tertiary referral should she choose, she has already seen Dr. Eugenia Pancoast at [facility] with respect to her chronically mildly dilated  bile duct status post cholecystectomy and he did not think any intervention was necessary. Screening for celiac disease was negative in 2008 with tissue transglutaminase antibody and she is also had negative duodenal biopsies in the past. Last colonoscopy 2011.   CC: Horton Finer, MD

## 2013-12-23 NOTE — Patient Instructions (Signed)
If you would like another GI  Opinion call me back.  You may want to make an appointment to see Dr. Percell Miller with regards to your back pain.   I appreciate the opportunity to care for you.

## 2013-12-23 NOTE — Assessment & Plan Note (Addendum)
I think this is her overall underlying GI diagnosis. I tried to explain this is diagnosis of exclusion I think her daughter understand that the patient does though she remains frustrated with her symptoms. She does not seem to have consistent her persistent diarrhea but I think a trial of a bile acid sequestrant might be reasonable. She did not want to do that today. I have offered tertiary referral should she choose, she has already seen Dr. Eugenia Pancoast at [facility] with respect to her chronically mildly dilated bile duct status post cholecystectomy and he did not think any intervention was necessary. Screening for celiac disease was negative in 2008 with tissue transglutaminase antibody and she is also had negative duodenal biopsies in the past. Last colonoscopy 2011.

## 2013-12-23 NOTE — Assessment & Plan Note (Addendum)
She continues to have this problem and relates onset to her cholecystectomy years ago. Records review during that time I cared for her and Dr. Velora Heckler care for shows a chronic recurrent problem without clear cause. I suspect irritable bowel syndrome. I do not believe we have substantiated her fevers, she is even seen infectious disease. We have tried numerous functional bowel disorder therapies, antibiotics etc. and nothing is cured her. I've explained to her that I think this is irritable bowel syndrome. She may have a fibromyalgia overlap. I also explained that I do not know what else to do for her to try to repeat the symptoms at this time though I remained reassured that she has not had unintentional weight loss or problems, I did tell her was taken to improve her quality of life but based upon her reactions to other medications and her problems I did not think I could at this time. The one thing I considered with something like a bile acid sequestrant since the pain is accompanied by loose stools at times but she can't get constipated. At this point she can continue intermittent narcotics as that seems to be the only thing to provide relief. I no that's not ideal but she does get these from her primary care physician. In her case that may be the best we can do. Other considerations would be something like duloxetine, she has failed or did not tolerate Neurontin, she did not tolerate or take a tricyclic agent in the past. Given her back pain I suggested she see her orthopedist again. Some of her symptoms certainly seem to be related to the spine I think given that she has back pain and pain in the legs, and she could have some abdominal pain on the basis of the neuropathic problem as well. I do not think it would explain all of her abdominal symptoms however.

## 2014-01-12 ENCOUNTER — Other Ambulatory Visit: Payer: Self-pay | Admitting: Obstetrics and Gynecology

## 2014-01-12 DIAGNOSIS — N390 Urinary tract infection, site not specified: Secondary | ICD-10-CM | POA: Diagnosis not present

## 2014-01-12 DIAGNOSIS — I219 Acute myocardial infarction, unspecified: Secondary | ICD-10-CM

## 2014-01-12 DIAGNOSIS — Z124 Encounter for screening for malignant neoplasm of cervix: Secondary | ICD-10-CM | POA: Diagnosis not present

## 2014-01-12 DIAGNOSIS — Z01419 Encounter for gynecological examination (general) (routine) without abnormal findings: Secondary | ICD-10-CM | POA: Diagnosis not present

## 2014-01-12 HISTORY — DX: Acute myocardial infarction, unspecified: I21.9

## 2014-01-20 DIAGNOSIS — H35379 Puckering of macula, unspecified eye: Secondary | ICD-10-CM | POA: Diagnosis not present

## 2014-01-20 DIAGNOSIS — H04129 Dry eye syndrome of unspecified lacrimal gland: Secondary | ICD-10-CM | POA: Diagnosis not present

## 2014-01-25 ENCOUNTER — Emergency Department (HOSPITAL_COMMUNITY): Payer: Medicare Other

## 2014-01-25 ENCOUNTER — Encounter (HOSPITAL_COMMUNITY): Payer: Self-pay | Admitting: Emergency Medicine

## 2014-01-25 ENCOUNTER — Inpatient Hospital Stay (HOSPITAL_COMMUNITY)
Admission: EM | Admit: 2014-01-25 | Discharge: 2014-01-27 | DRG: 282 | Disposition: A | Discharge status: Home / Self Care | Payer: Medicare Other | Attending: Interventional Cardiology | Admitting: Interventional Cardiology

## 2014-01-25 DIAGNOSIS — I480 Paroxysmal atrial fibrillation: Secondary | ICD-10-CM | POA: Diagnosis present

## 2014-01-25 DIAGNOSIS — R0789 Other chest pain: Secondary | ICD-10-CM | POA: Diagnosis not present

## 2014-01-25 DIAGNOSIS — F411 Generalized anxiety disorder: Secondary | ICD-10-CM | POA: Diagnosis present

## 2014-01-25 DIAGNOSIS — R079 Chest pain, unspecified: Secondary | ICD-10-CM | POA: Diagnosis present

## 2014-01-25 DIAGNOSIS — F3289 Other specified depressive episodes: Secondary | ICD-10-CM | POA: Diagnosis present

## 2014-01-25 DIAGNOSIS — G609 Hereditary and idiopathic neuropathy, unspecified: Secondary | ICD-10-CM | POA: Diagnosis not present

## 2014-01-25 DIAGNOSIS — I10 Essential (primary) hypertension: Secondary | ICD-10-CM | POA: Diagnosis present

## 2014-01-25 DIAGNOSIS — I341 Nonrheumatic mitral (valve) prolapse: Secondary | ICD-10-CM | POA: Diagnosis present

## 2014-01-25 DIAGNOSIS — F329 Major depressive disorder, single episode, unspecified: Secondary | ICD-10-CM | POA: Diagnosis present

## 2014-01-25 DIAGNOSIS — E785 Hyperlipidemia, unspecified: Secondary | ICD-10-CM | POA: Diagnosis present

## 2014-01-25 DIAGNOSIS — Z79899 Other long term (current) drug therapy: Secondary | ICD-10-CM

## 2014-01-25 DIAGNOSIS — Z7982 Long term (current) use of aspirin: Secondary | ICD-10-CM

## 2014-01-25 DIAGNOSIS — I4891 Unspecified atrial fibrillation: Secondary | ICD-10-CM | POA: Diagnosis not present

## 2014-01-25 DIAGNOSIS — I499 Cardiac arrhythmia, unspecified: Secondary | ICD-10-CM | POA: Diagnosis not present

## 2014-01-25 DIAGNOSIS — K219 Gastro-esophageal reflux disease without esophagitis: Secondary | ICD-10-CM | POA: Diagnosis present

## 2014-01-25 DIAGNOSIS — I214 Non-ST elevation (NSTEMI) myocardial infarction: Secondary | ICD-10-CM | POA: Diagnosis not present

## 2014-01-25 DIAGNOSIS — I495 Sick sinus syndrome: Secondary | ICD-10-CM

## 2014-01-25 DIAGNOSIS — M1990 Unspecified osteoarthritis, unspecified site: Secondary | ICD-10-CM | POA: Diagnosis present

## 2014-01-25 DIAGNOSIS — F341 Dysthymic disorder: Secondary | ICD-10-CM

## 2014-01-25 DIAGNOSIS — I2510 Atherosclerotic heart disease of native coronary artery without angina pectoris: Secondary | ICD-10-CM | POA: Diagnosis not present

## 2014-01-25 DIAGNOSIS — I059 Rheumatic mitral valve disease, unspecified: Secondary | ICD-10-CM | POA: Diagnosis present

## 2014-01-25 HISTORY — DX: Chest pain, unspecified: R07.9

## 2014-01-25 HISTORY — DX: Sick sinus syndrome: I49.5

## 2014-01-25 LAB — URINE MICROSCOPIC-ADD ON

## 2014-01-25 LAB — BASIC METABOLIC PANEL
BUN: 19 mg/dL (ref 6–23)
CHLORIDE: 101 meq/L (ref 96–112)
CO2: 24 mEq/L (ref 19–32)
Calcium: 9.1 mg/dL (ref 8.4–10.5)
Creatinine, Ser: 0.59 mg/dL (ref 0.50–1.10)
GFR calc Af Amer: >90 mL/min (ref >90)
GFR calc non Af Amer: 82 mL/min — ABNORMAL LOW (ref >90)
Glucose, Bld: 99 mg/dL (ref 70–99)
POTASSIUM: 4 meq/L (ref 3.7–5.3)
SODIUM: 139 meq/L (ref 137–147)

## 2014-01-25 LAB — URINALYSIS, ROUTINE W REFLEX MICROSCOPIC
Bilirubin Urine: NEGATIVE
Glucose, UA: NEGATIVE mg/dL
Ketones, ur: NEGATIVE mg/dL
Leukocytes, UA: NEGATIVE
NITRITE: NEGATIVE
Protein, ur: NEGATIVE mg/dL
SPECIFIC GRAVITY, URINE: 1.018 (ref 1.005–1.030)
UROBILINOGEN UA: 0.2 mg/dL (ref 0.0–1.0)
pH: 6 (ref 5.0–8.0)

## 2014-01-25 LAB — MRSA PCR SCREENING: MRSA BY PCR: NEGATIVE

## 2014-01-25 LAB — CBC WITH DIFFERENTIAL/PLATELET
BASOS ABS: 0 10*3/uL (ref 0.0–0.1)
Basophils Relative: 0 % (ref 0–1)
EOS ABS: 0 10*3/uL (ref 0.0–0.7)
Eosinophils Relative: 0 % (ref 0–5)
HCT: 39.2 % (ref 36.0–46.0)
HEMOGLOBIN: 13.6 g/dL (ref 12.0–15.0)
Lymphocytes Relative: 5 % — ABNORMAL LOW (ref 12–46)
Lymphs Abs: 0.4 10*3/uL — ABNORMAL LOW (ref 0.7–4.0)
MCH: 31.3 pg (ref 26.0–34.0)
MCHC: 34.7 g/dL (ref 30.0–36.0)
MCV: 90.1 fL (ref 78.0–100.0)
MONOS PCT: 3 % (ref 3–12)
Monocytes Absolute: 0.3 10*3/uL (ref 0.1–1.0)
NEUTROS ABS: 8 10*3/uL — AB (ref 1.7–7.7)
NEUTROS PCT: 92 % — AB (ref 43–77)
PLATELETS: 150 10*3/uL (ref 150–400)
RBC: 4.35 MIL/uL (ref 3.87–5.11)
RDW: 13.1 % (ref 11.5–15.5)
WBC: 8.7 10*3/uL (ref 4.0–10.5)

## 2014-01-25 LAB — TROPONIN I
TROPONIN I: 0.95 ng/mL — AB (ref <0.30)
Troponin I: 1.2 ng/mL (ref <0.30)

## 2014-01-25 LAB — TSH: TSH: 1.53 u[IU]/mL (ref 0.350–4.500)

## 2014-01-25 MED ORDER — ASPIRIN EC 81 MG PO TBEC
81.0000 mg | DELAYED_RELEASE_TABLET | Freq: Every day | ORAL | Status: DC
  Administered 2014-01-27: 10:00:00 81 mg via ORAL
  Filled 2014-01-25: qty 1

## 2014-01-25 MED ORDER — NITROGLYCERIN 0.4 MG SL SUBL: 0.4000 mg | SUBLINGUAL_TABLET | SUBLINGUAL | Status: DC | PRN

## 2014-01-25 MED ORDER — SODIUM CHLORIDE 0.9 % IV SOLN: INTRAVENOUS | Status: DC

## 2014-01-25 MED ORDER — HEPARIN (PORCINE) IN NACL 100-0.45 UNIT/ML-% IJ SOLN
800.0000 [IU]/h | INTRAMUSCULAR | Status: DC
  Administered 2014-01-25: 700 [IU]/h via INTRAVENOUS
  Filled 2014-01-25: qty 250

## 2014-01-25 MED ORDER — ASPIRIN 81 MG PO CHEW: 324.0000 mg | CHEWABLE_TABLET | Freq: Once | ORAL | Status: DC

## 2014-01-25 MED ORDER — SODIUM CHLORIDE 0.9 % IJ SOLN: 3.0000 mL | INTRAMUSCULAR | Status: DC | PRN

## 2014-01-25 MED ORDER — SODIUM CHLORIDE 0.9 % IJ SOLN
3.0000 mL | Freq: Two times a day (BID) | INTRAMUSCULAR | Status: DC
  Administered 2014-01-25 – 2014-01-26 (×2): 3 mL via INTRAVENOUS

## 2014-01-25 MED ORDER — ALPRAZOLAM 0.25 MG PO TABS
0.1250 mg | ORAL_TABLET | Freq: Every evening | ORAL | Status: DC | PRN
  Administered 2014-01-25 – 2014-01-26 (×2): 0.125 mg via ORAL
  Filled 2014-01-25 (×2): qty 1

## 2014-01-25 MED ORDER — SODIUM CHLORIDE 0.9 % IV SOLN: 250.0000 mL | INTRAVENOUS | Status: DC | PRN

## 2014-01-25 MED ORDER — HYDROCODONE-ACETAMINOPHEN 5-325 MG PO TABS
1.0000 | ORAL_TABLET | Freq: Four times a day (QID) | ORAL | Status: DC | PRN
  Administered 2014-01-25 – 2014-01-27 (×3): 1 via ORAL
  Filled 2014-01-25 (×3): qty 1

## 2014-01-25 MED ORDER — ESTROPIPATE 1.5 MG PO TABS: 1.5000 mg | ORAL_TABLET | ORAL | Status: DC

## 2014-01-25 MED ORDER — ATORVASTATIN CALCIUM 80 MG PO TABS
80.0000 mg | ORAL_TABLET | Freq: Every day | ORAL | Status: DC
  Administered 2014-01-26: 22:00:00 80 mg via ORAL
  Filled 2014-01-25 (×4): qty 1

## 2014-01-25 MED ORDER — ASPIRIN 81 MG PO CHEW
81.0000 mg | CHEWABLE_TABLET | ORAL | Status: AC
  Administered 2014-01-26: 81 mg via ORAL
  Filled 2014-01-25: qty 1

## 2014-01-25 MED ORDER — HEPARIN BOLUS VIA INFUSION
3000.0000 [IU] | Freq: Once | INTRAVENOUS | Status: AC
  Administered 2014-01-25: 3000 [IU] via INTRAVENOUS
  Filled 2014-01-25: qty 3000

## 2014-01-25 MED ORDER — HYDROCODONE-ACETAMINOPHEN 5-325 MG PO TABS: 1.0000 | ORAL_TABLET | Freq: Four times a day (QID) | ORAL | Status: DC | PRN

## 2014-01-25 MED ORDER — SODIUM CHLORIDE 0.9 % IJ SOLN
3.0000 mL | Freq: Two times a day (BID) | INTRAMUSCULAR | Status: DC
  Administered 2014-01-25: 3 mL via INTRAVENOUS

## 2014-01-25 NOTE — ED Notes (Addendum)
Pt to ED via EMS with c/o sudden onset of chest pain while sitting in a chair. Pt states she is been having off and on low grade fever since 1998. Pt states when pain came on she was shacking. Hx of anxiety. Per EMS, BP-118/54, pulse-74, RR-18, CBG-157. EMS given nitro x1 and pt took 324 mg ASA and xanax. 22G left A/C line placed by EMS.
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
## 2014-01-25 NOTE — ED Notes (Signed)
Tray ordered for heart healthy.

## 2014-01-26 ENCOUNTER — Encounter (HOSPITAL_COMMUNITY)
Admission: EM | Disposition: A | Discharge status: Home / Self Care | Payer: Self-pay | Source: Home / Self Care | Attending: Interventional Cardiology

## 2014-01-26 DIAGNOSIS — I2510 Atherosclerotic heart disease of native coronary artery without angina pectoris: Secondary | ICD-10-CM

## 2014-01-26 DIAGNOSIS — I4891 Unspecified atrial fibrillation: Secondary | ICD-10-CM | POA: Diagnosis not present

## 2014-01-26 DIAGNOSIS — I495 Sick sinus syndrome: Secondary | ICD-10-CM | POA: Diagnosis not present

## 2014-01-26 DIAGNOSIS — I214 Non-ST elevation (NSTEMI) myocardial infarction: Secondary | ICD-10-CM | POA: Diagnosis not present

## 2014-01-26 DIAGNOSIS — G609 Hereditary and idiopathic neuropathy, unspecified: Secondary | ICD-10-CM | POA: Diagnosis not present

## 2014-01-26 HISTORY — PX: LEFT HEART CATHETERIZATION WITH CORONARY ANGIOGRAM: SHX5451

## 2014-01-26 LAB — CBC
HEMATOCRIT: 39.2 % (ref 36.0–46.0)
HEMOGLOBIN: 13.2 g/dL (ref 12.0–15.0)
MCH: 30.8 pg (ref 26.0–34.0)
MCHC: 33.7 g/dL (ref 30.0–36.0)
MCV: 91.4 fL (ref 78.0–100.0)
Platelets: 144 10*3/uL — ABNORMAL LOW (ref 150–400)
RBC: 4.29 MIL/uL (ref 3.87–5.11)
RDW: 13.3 % (ref 11.5–15.5)
WBC: 5 10*3/uL (ref 4.0–10.5)

## 2014-01-26 LAB — LIPID PANEL
CHOLESTEROL: 162 mg/dL (ref 0–200)
HDL: 77 mg/dL (ref >39)
LDL Cholesterol: 75 mg/dL (ref 0–99)
TRIGLYCERIDES: 49 mg/dL (ref <150)
Total CHOL/HDL Ratio: 2.1 RATIO
VLDL: 10 mg/dL (ref 0–40)

## 2014-01-26 LAB — HEPARIN LEVEL (UNFRACTIONATED)
HEPARIN UNFRACTIONATED: 0.24 [IU]/mL — AB (ref 0.30–0.70)
Heparin Unfractionated: 0.35 IU/mL (ref 0.30–0.70)

## 2014-01-26 LAB — HEMOGLOBIN A1C
Hgb A1c MFr Bld: 5.2 % (ref <5.7)
Mean Plasma Glucose: 103 mg/dL (ref <117)

## 2014-01-26 LAB — TROPONIN I
TROPONIN I: 1.18 ng/mL — AB (ref <0.30)
Troponin I: 0.79 ng/mL (ref <0.30)

## 2014-01-26 SURGERY — LEFT HEART CATHETERIZATION WITH CORONARY ANGIOGRAM
Anesthesia: LOCAL

## 2014-01-26 MED ORDER — YOU HAVE A PACEMAKER BOOK
Freq: Once | Status: DC
  Administered 2014-01-26: 18:00:00
  Filled 2014-01-26: qty 1

## 2014-01-26 MED ORDER — ENOXAPARIN SODIUM 40 MG/0.4ML ~~LOC~~ SOLN
40.0000 mg | SUBCUTANEOUS | Status: DC
  Filled 2014-01-26: qty 0.4

## 2014-01-26 MED ORDER — ENOXAPARIN SODIUM 60 MG/0.6ML ~~LOC~~ SOLN
55.0000 mg | Freq: Two times a day (BID) | SUBCUTANEOUS | Status: DC
  Filled 2014-01-26 (×2): qty 0.6

## 2014-01-26 MED ORDER — NITROGLYCERIN 0.2 MG/ML ON CALL CATH LAB
INTRAVENOUS | Status: AC
  Filled 2014-01-26: qty 1

## 2014-01-26 MED ORDER — SODIUM CHLORIDE 0.9 % IV SOLN
INTRAVENOUS | Status: AC
  Administered 2014-01-26: 13:00:00 via INTRAVENOUS

## 2014-01-26 MED ORDER — FENTANYL CITRATE 0.05 MG/ML IJ SOLN
INTRAMUSCULAR | Status: AC
  Filled 2014-01-26: qty 2

## 2014-01-26 MED ORDER — LIDOCAINE HCL (PF) 1 % IJ SOLN
INTRAMUSCULAR | Status: AC
  Filled 2014-01-26: qty 30

## 2014-01-26 MED ORDER — MIDAZOLAM HCL 2 MG/2ML IJ SOLN
INTRAMUSCULAR | Status: AC
  Filled 2014-01-26: qty 2

## 2014-01-26 MED ORDER — HEPARIN (PORCINE) IN NACL 2-0.9 UNIT/ML-% IJ SOLN
INTRAMUSCULAR | Status: AC
  Filled 2014-01-26: qty 1000

## 2014-01-26 MED ORDER — VERAPAMIL HCL 2.5 MG/ML IV SOLN
INTRAVENOUS | Status: AC
  Filled 2014-01-26: qty 2

## 2014-01-26 NOTE — Care Management Note (Addendum)
  Page 1 of 1   01/27/2014     2:34:28 PM   CARE MANAGEMENT NOTE 01/27/2014  Patient:  Kaitlin [name], Kaitlin [name]   Account Number:  [id number]  Date Initiated:  01/26/2014  Documentation initiated by:  Elissa Hefty  Subjective/Objective Assessment:   adm w ch pain, mi     Action/Plan:   lives alone, pcp dr Nehemiah Settle   Anticipated DC Date:  01/27/2014   Anticipated DC Plan:  HOME/SELF CARE         Choice offered to / List presented to:             Status of service:  Completed, signed off Medicare Important Message given?   (If response is "NO", the following Medicare IM given date fields will be blank) Date Medicare IM given:   Date Additional Medicare IM given:    Discharge Disposition:  HOME/SELF CARE  Per UR Regulation:  Reviewed for med. necessity/level of care/duration of stay  If discussed at Long Length of Stay Meetings, dates discussed:    Comments:  No CM needs identified this admission Dispositon:  Home / Self care. Brigid Vandekamp RN, BSN, [place], [place] 01/27/2014

## 2014-01-26 NOTE — Progress Notes (Signed)
ANTICOAGULATION CONSULT NOTE - Follow Up Consult  Pharmacy Consult for Lovenox Indication: chest pain/ACS  Allergies  Allergen Reactions  . Nizatidine   . Codeine Rash  . Morphine And Related Rash    Patient Measurements: Height: 5\' 5"  (165.1 cm) Weight: 120 lb 9.5 oz (54.7 kg) IBW/kg (Calculated) : 57  Vital Signs: Temp: 97.5 F (36.4 C) (04/15 1347) Temp src: Oral (04/15 1347) BP: 148/60 mmHg (04/15 1347) Pulse Rate: 66 (04/15 1347)  Labs:  Recent Labs  01/25/14 1629 01/25/14 2225 01/26/14 0301 01/26/14 0924  HGB 13.6  --  13.2  --   HCT 39.2  --  39.2  --   PLT 150  --  144*  --   HEPARINUNFRC  --   --  0.24* 0.35  CREATININE 0.59  --   --   --   TROPONINI 0.95* 1.20* 1.18* 0.79*    Estimated Creatinine Clearance: 45.2 ml/min (by C-G formula based on Cr of 0.59).  Assessment: 81 yof presented to the ED with CP on 4/14 PM. Found to have an NSTEMI and be in afib. Was on IV heparin for anticoagulation, s/p cath today, no lesion found, likely caused by coronary artery spasm or transient thrombosis. Pharmacy is consulted to start full dose lovenox 8 hrs post sheath removal (~ 1210), per discussion with Dr. [name], will continue full anticoagulation for 48 hrs since original presentation and switch to prophylaxis dose. Currently pt. Is in NSR, hgb 13.2, plt 144 K this morning. Scr 0.59, est. crcl ~ 45 ml/min. Not on anticoagulation prior to admission.  Goal of Therapy:  Anti-Xa level 0.6-1 units/ml 4hrs after LMWH dose given Monitor platelets by anticoagulation protocol: Yes   Plan:  - Start lovenox 55 mg sq Q 12 hrs at 2030 x 2 doses then start lovenox 40mg  sq q 24 hrs from 4/17 - Monitor cbc, renal function and s/sx of bleeding  Maryanna Shape, PharmD, BCPS  Clinical Pharmacist  Pager: [phone]  01/26/2014,1:55 PM

## 2014-01-26 NOTE — H&P (View-Only) (Signed)
The patient presents with unstable angina/ NSTEMI. Have discussed the implications and agree coronary angio is needed. Patient aware. Risk and nature of procedure discussed. 

## 2014-01-26 NOTE — Progress Notes (Signed)
The patient presents with unstable angina/ NSTEMI. Have discussed the implications and agree coronary angio is needed. Patient aware. Risk and nature of procedure discussed.

## 2014-01-26 NOTE — Progress Notes (Signed)
ANTICOAGULATION CONSULT NOTE - Follow Up Consult  Pharmacy Consult for heparin Indication: NSTEMI  Labs:  Recent Labs  01/25/14 1629 01/25/14 2225 01/26/14 0301  HGB 13.6  --  13.2  HCT 39.2  --  39.2  PLT 150  --  144*  HEPARINUNFRC  --   --  0.24*  CREATININE 0.59  --   --   TROPONINI 0.95* 1.20*  --     Assessment: 80yo female subtherapeutic on heparin with initial dosing for NSTEMI.  Goal of Therapy:  Heparin level 0.3-0.7 units/ml   Plan:  Will increase heparin gtt by 2 units/kg/hr to 800 units/hr and check level with next troponin.  Wynona Neat, PharmD, BCPS  01/26/2014,3:42 AM

## 2014-01-26 NOTE — Interval H&P Note (Signed)
Cath Lab Visit (complete for each Cath Lab visit)  Clinical Evaluation Leading to the Procedure:   ACS: yes  Non-ACS:    Anginal Classification: CCS IV  Anti-ischemic medical therapy: Minimal Therapy (1 class of medications)  Non-Invasive Test Results: No non-invasive testing performed  Prior CABG: No previous CABG      History and Physical Interval Note:  01/26/2014 9:59 AM  Kaitlin [name]  has presented today for surgery, with the diagnosis of c/p  The various methods of treatment have been discussed with the patient and family. After consideration of risks, benefits and other options for treatment, the patient has consented to  Procedure(s): LEFT HEART CATHETERIZATION WITH CORONARY ANGIOGRAM (N/A) as a surgical intervention .  The patient's history has been reviewed, patient examined, no change in status, stable for surgery.  I have reviewed the patient's chart and labs.  Questions were answered to the patient's satisfaction.     Belva Crome III

## 2014-01-26 NOTE — Progress Notes (Signed)
TR BAND REMOVAL  LOCATION:  right radial  DEFLATED PER PROTOCOL:  yes  TIME BAND OFF / DRESSING APPLIED:   1600   SITE UPON ARRIVAL:   Level 0  SITE AFTER BAND REMOVAL:  Level 0  REVERSE ALLEN'S TEST:    positive  CIRCULATION SENSATION AND MOVEMENT:  Within Normal Limits  yes  COMMENTS:  Bleed with second deflation; reinflated per protocol and resumed deflations without incident.

## 2014-01-26 NOTE — CV Procedure (Signed)
     Left Heart Catheterization with Coronary Angiography  Report  Kaitlin [name]  80 y.o.  female 07-Jun-1927  Procedure Date: 01/26/2014 Referring Physician:  Irish Lack, MD Primary Cardiologist: HWBSmith, III, MD  INDICATIONS: NSTEMI  PROCEDURE: 1. Left heart cath; 2.Coronary angiography; 3. Left ventriculogram  CONSENT:  The risks, benefits, and details of the procedure were explained in detail to the patient. Risks including death, stroke, heart attack, kidney injury, allergy, limb ischemia, bleeding and radiation injury were discussed.  The patient verbalized understanding and wanted to proceed.  Informed written consent was obtained.  PROCEDURE TECHNIQUE:  After Xylocaine anesthesia a 5 F sheath was placed in the right radial artery with an angiocath and the modified Seldinger technique.  Coronary angiography was done using a 5 F JL3.5 and JR4 catheter.  Left ventriculography was done using the JR4 catheter and hand injection.   Hemostasis achieved with a Wrist Band.   CONTRAST:  Total of 100 cc.  COMPLICATIONS:  none   HEMODYNAMICS:  Aortic pressure 143/71 mmHg; LV pressure 158/1 mmHg; LVEDP 20 mm mercury;  ANGIOGRAPHIC DATA:   The left main coronary artery is normal..  The left anterior descending artery is widely patent. There is proximal vessel ectasia. A large first diagonal and the continuation of the LAD are normal. The LAD wraps around the left ventricular apex..  The left circumflex artery is widely patent. 3 obtuse marginal branches arise from the circumflex artery. The first obtuse marginal contains ostial 50-60% stenosis. The proximal to mid circumflex is ectatic.  The right coronary artery is normal.   LEFT VENTRICULOGRAM:  Left ventricular angiogram was done in the 30 RAO projection and revealed ventricular ectopy but normal contractility with an EF greater than 70%.   IMPRESSIONS:  1. Widely patent coronary arteries. 2. Normal left ventricular  function 3. Elevated troponins, due to an unknown mechanism, but likely related to either coronary artery spasm or transient thrombosis.   RECOMMENDATION:  Medical therapy of anxiety disorder.  Medical therapy of blood pressure.  Risk factor modification.

## 2014-01-27 DIAGNOSIS — I341 Nonrheumatic mitral (valve) prolapse: Secondary | ICD-10-CM | POA: Diagnosis present

## 2014-01-27 DIAGNOSIS — M1990 Unspecified osteoarthritis, unspecified site: Secondary | ICD-10-CM | POA: Diagnosis present

## 2014-01-27 DIAGNOSIS — I495 Sick sinus syndrome: Secondary | ICD-10-CM | POA: Diagnosis present

## 2014-01-27 DIAGNOSIS — I214 Non-ST elevation (NSTEMI) myocardial infarction: Secondary | ICD-10-CM | POA: Diagnosis not present

## 2014-01-27 DIAGNOSIS — K219 Gastro-esophageal reflux disease without esophagitis: Secondary | ICD-10-CM | POA: Diagnosis present

## 2014-01-27 DIAGNOSIS — R079 Chest pain, unspecified: Secondary | ICD-10-CM | POA: Diagnosis present

## 2014-01-27 LAB — URINE CULTURE
COLONY COUNT: NO GROWTH
Culture: NO GROWTH

## 2014-01-27 MED ORDER — ASPIRIN 81 MG PO TBEC: 81.0000 mg | DELAYED_RELEASE_TABLET | Freq: Every day | ORAL | Status: DC

## 2014-01-27 MED ORDER — NITROGLYCERIN 0.4 MG SL SUBL: 0.4000 mg | SUBLINGUAL_TABLET | SUBLINGUAL | Status: DC | PRN

## 2014-01-27 NOTE — Discharge Summary (Signed)
See full note.cdm 

## 2014-01-27 NOTE — Discharge Instructions (Signed)

## 2014-01-27 NOTE — Progress Notes (Signed)
CARDIAC REHAB PHASE I   PRE:  Rate/Rhythm: 74 SR  BP:  Supine: 127/47  Sitting:   Standing:    SaO2:   MODE:  Ambulation: 1000 ft   POST:  Rate/Rhythm: 94 SR  BP:  Supine: 157/63  Sitting:   Standing:    SaO2:   0750-0900 Pt walked 1000 ft with steady gait. Tolerated well. No CP. Education completed with pt who voiced understanding. Discussed CRP 2 and pt gave permission to refer to [facility] program.    Graylon Good, RN BSN  01/27/2014 8:55 AM

## 2014-01-27 NOTE — Progress Notes (Signed)
Patient Name: Kaitlin [name] Date of Encounter: 01/27/2014     Active Problems:   NSTEMI (non-ST elevated myocardial infarction)    SUBJECTIVE  She is feeling fine. No CP. and ready to go home.  CURRENT MEDS . aspirin EC  81 mg Oral Daily  . atorvastatin  80 mg Oral q1800  . [START ON 01/28/2014] enoxaparin (LOVENOX) injection  40 mg Subcutaneous Q24H  . enoxaparin (LOVENOX) injection  55 mg Subcutaneous Q12H    OBJECTIVE  Filed Vitals:   01/26/14 1946 01/26/14 1951 01/26/14 2353 01/27/14 0443  BP: 140/52 129/56 118/42 140/47  Pulse: 68 72 70 72  Temp:   97.8 F (36.6 C) 97.3 F (36.3 C)  TempSrc:   Oral Oral  Resp:   16 18  Height:      Weight:   121 lb 7.6 oz (55.1 kg)   SpO2:   99% 100%    Intake/Output Summary (Last 24 hours) at 01/27/14 0639 Last data filed at 01/26/14 1900  Gross per 24 hour  Intake 1209.25 ml  Output    750 ml  Net 459.25 ml   Filed Weights   01/25/14 2138 01/26/14 2353  Weight: 120 lb 9.5 oz (54.7 kg) 121 lb 7.6 oz (55.1 kg)    PHYSICAL EXAM  General: Pleasant, NAD. Frail, thin Neuro: Alert and oriented X 3. Moves all extremities spontaneously. Psych: Normal affect. HEENT:  Normal  Neck: Supple without bruits or JVD. Lungs:  Resp regular and unlabored, CTA. Heart: RRR no s3, s4, or murmurs. Abdomen: Soft, non-tender, non-distended, BS + x 4.  Extremities: No clubbing, cyanosis or edema. DP/PT/Radials 2+ and equal bilaterally.  Accessory Clinical Findings  CBC  Recent Labs  01/25/14 1629 01/26/14 0301  WBC 8.7 5.0  NEUTROABS 8.0*  --   HGB 13.6 13.2  HCT 39.2 39.2  MCV 90.1 91.4  PLT 150 144*   Basic Metabolic Panel  Recent Labs  01/25/14 1629  NA 139  K 4.0  CL 101  CO2 24  GLUCOSE 99  BUN 19  CREATININE 0.59  CALCIUM 9.1   Cardiac Enzymes  Recent Labs  01/25/14 2225 01/26/14 0301 01/26/14 0924  TROPONINI 1.20* 1.18* 0.79*   Hemoglobin A1C  Recent Labs  01/25/14 2225  HGBA1C 5.2    Fasting Lipid Panel  Recent Labs  01/26/14 0301  CHOL 162  HDL 77  LDLCALC 75  TRIG 49  CHOLHDL 2.1   Thyroid Function Tests  Recent Labs  01/25/14 2225  TSH 1.530    TELE  PVCs, some PACs  Radiology/Studies  Dg Chest Port 1 View  01/25/2014   CLINICAL DATA:  Sudden onset of chest pain and left arm numbness; history of atrial fibrillation and tachycardia  EXAM: PORTABLE CHEST - 1 VIEW  COMPARISON:  DG CHEST 2 VIEW dated 05/26/2012  FINDINGS: The lungs are mildly hyperinflated. This is not a new finding. There is new coarse increased density at the lung bases especially on the left lateral to the cardiac apex. The pulmonary vascularity is not engorged. There is no pleural effusion or pneumothorax. The bony thorax appears normal.  IMPRESSION: There is mild hyperinflation consistent with underlying COPD or reactive airway disease. New increased lung markings in the infrahilar regions bilaterally are present and may reflect sat subsegmental atelectasis or early pneumonia. A follow-up PA and lateral chest x-ray is recommended.   Electronically Signed   By: David  Martinique   On: 01/25/2014 17:43    Left  Heart Catheterization with Coronary Angiography Report  Polly Barner  80 y.o.  female  [birth year]  Procedure Date: 01/26/2014  Referring Physician: Irish Lack, MD  Primary Cardiologist: HWBSmith, III, MD  INDICATIONS: NSTEMI  PROCEDURE: 1. Left heart cath; 2.Coronary angiography; 3. Left ventriculogram  CONSENT:  The risks, benefits, and details of the procedure were explained in detail to the patient. Risks including death, stroke, heart attack, kidney injury, allergy, limb ischemia, bleeding and radiation injury were discussed. The patient verbalized understanding and wanted to proceed. Informed written consent was obtained.  PROCEDURE TECHNIQUE: After Xylocaine anesthesia a 5 F sheath was placed in the right radial artery with an angiocath and the modified Seldinger technique.  Coronary angiography was done using a 5 F JL3.5 and JR4 catheter. Left ventriculography was done using the JR4 catheter and hand injection.  Hemostasis achieved with a Wrist Band.  CONTRAST: Total of 100 cc.  COMPLICATIONS: none  HEMODYNAMICS: Aortic pressure 143/71 mmHg; LV pressure 158/1 mmHg; LVEDP 20 mm mercury;  ANGIOGRAPHIC DATA: The left main coronary artery is normal..  The left anterior descending artery is widely patent. There is proximal vessel ectasia. A large first diagonal and the continuation of the LAD are normal. The LAD wraps around the left ventricular apex..  The left circumflex artery is widely patent. 3 obtuse marginal branches arise from the circumflex artery. The first obtuse marginal contains ostial 50-60% stenosis. The proximal to mid circumflex is ectatic.  The right coronary artery is normal.  LEFT VENTRICULOGRAM: Left ventricular angiogram was done in the 30 RAO projection and revealed ventricular ectopy but normal contractility with an EF greater than 70%.  IMPRESSIONS: 1. Widely patent coronary arteries.  2. Normal left ventricular function  3. Elevated troponins, due to an unknown mechanism, but likely related to either coronary artery spasm or transient thrombosis.  RECOMMENDATION: Medical therapy of anxiety disorder.  Medical therapy of blood pressure.  Risk factor modification.   ASSESSMENT AND PLAN 89 old woman with a history of paroxysmal atrial fibrillation, mitral valve prolapse and tachybradycardia syndrome. She developed a sudden onset of chest discomfort yesterday and presented to [facility] ED and was found to have mildly elevated troponin.  NSTEMI (pk trp 1.2) --s/p cath yesterday- widely patent coronary arteries and normal left ventricular function  -- Elevated troponins, due to an unknown mechanism, but likely related to either coronary artery spasm or transient thrombosis.  -- Medical therapy of anxiety disorder and blood pressure and risk factor  modification. -- Tachycardia bradycardia syndrome, documented by cardiac monitor with episodes of atrial fibrillation and post conversion pauses greater than 4 seconds. This syndrome this symptom producing. Pacemaker therapy was recommended but the patient refused. Beta blocker therapy was discontinued to decrease the postconversion pauses.  PAF- with history of post-conversion pauses -- NSR currently -- BB held due to hx of pauses -- refuses anticoagulation ( easy bleeder)   HTN- blood pressure moderately controlled. She does not have a history of HTN. Recs to treat this in Dr. Darliss Ridgel cath note  HLD (none)- Lipid panel normal on no medication  Signed, Perry Mount PA-C  Pager [phone]  I have personally seen and examined this patient with Lorretta Harp, PA-C. I agree with the assessment and plan as outlined above. Start ASA 81 mg QDaily. Discharge home today. F/U Dr. [name] 2 weeks.   Burnell Blanks 8:50 AM 01/27/2014

## 2014-01-27 NOTE — Discharge Summary (Signed)
Discharge Summary   Patient ID: Kaitlin [name] MRN: [id number], DOB/AGE: June 23, 1927 80 y.o. Admit date: 01/25/2014 D/C date:     01/27/2014  Primary Cardiologist: Dr. Tamala [name]  Principal Problem:   NSTEMI (non-ST elevated myocardial infarction) Active Problems:   PERIPHERAL NEUROPATHY   Paroxysmal atrial fibrillation   GERD (gastroesophageal reflux disease)   Osteoarthritis   Mitral valve prolapse   Tachy-brady syndrome   Chest pain   Admission Dates: 01/25/14 - 01/27/14 Discharge Diagnosis: NSTEMI s/p LHC on 01/26/14 with normal coronaries. Elevated troponins, due to an unknown mechanism, but likely related to either coronary artery spasm or transient thrombosis.  HPI: Kaitlin [name] is a 80 y.o. female with a history of paroxysmal atrial fibrillation, mitral valve prolapse and tachybradycardia syndrome who presented to the [facility] ED on 01/25/14 with chest pain and was found to have mildly elevated troponin.    Hospital Course: She presented with NSTEMI (pk trp 1.2) on 01/25/14 and was placed on heparin with plans for cardiac cath in the morning. She underwent left heart cath on 01/26/14 which revealed widely patent coronary arteries and normal left ventricular function. The cause of her elevated troponins are due to an unknown mechanism, but thought to be related to either coronary artery spasm or transient thrombosis. Medical therapy of anxiety disorder, blood pressure and risk factor modification was recommended. She has a history of tachycardia bradycardia syndrome, documented by cardiac monitor with episodes of atrial fibrillation and post conversion pauses greater than 4 seconds. Pacemaker therapy was recommended but the patient refused. Beta blocker therapy was discontinued to decrease the postconversion pauses. Additionally, she has refused anticoagulation in the past as well as presently due to her being an "easy bleeder."  She remained in NSR with frequent PACs while inpatient.She recently  had a lipid panel done which was normal without medical therpay. For this reason and the absence of CAD on cath, she will not go home on a statin.   The patient has had an uncomplicated hospital course and is recovering well. The radial catheter site is stable. She has been seen by Kaitlin [name] today and deemed ready for discharge home. All follow-up appointments have been scheduled. Discharge medications include ASA 81 mg QDaily and SL NTG.    Discharge Vitals: Blood pressure 127/47, pulse 65, temperature 97.6 F (36.4 C), temperature source Oral, resp. rate 18, height 5\' 5"  (1.651 m), weight 121 lb 7.6 oz (55.1 kg), SpO2 100.00%.  Labs: Lab Results  Component Value Date   WBC 5.0 01/26/2014   HGB 13.2 01/26/2014   HCT 39.2 01/26/2014   MCV 91.4 01/26/2014   PLT 144* 01/26/2014     Recent Labs Lab 01/25/14 1629  NA 139  K 4.0  CL 101  CO2 24  BUN 19  CREATININE 0.59  CALCIUM 9.1  GLUCOSE 99    Recent Labs  01/25/14 1629 01/25/14 2225 01/26/14 0301 01/26/14 0924  TROPONINI 0.95* 1.20* 1.18* 0.79*   Lab Results  Component Value Date   CHOL 162 01/26/2014   HDL 77 01/26/2014   LDLCALC 75 01/26/2014   TRIG 49 01/26/2014     Diagnostic Studies/Procedures   Dg Chest Port 1 View  01/25/2014   CLINICAL DATA:  Sudden onset of chest pain and left arm numbness; history of atrial fibrillation and tachycardia  EXAM: PORTABLE CHEST - 1 VIEW  COMPARISON:  DG CHEST 2 VIEW dated 05/26/2012  FINDINGS: The lungs are mildly hyperinflated. This is not a new  finding. There is new coarse increased density at the lung bases especially on the left lateral to the cardiac apex. The pulmonary vascularity is not engorged. There is no pleural effusion or pneumothorax. The bony thorax appears normal.  IMPRESSION: There is mild hyperinflation consistent with underlying COPD or reactive airway disease. New increased lung markings in the infrahilar regions bilaterally are present and may reflect sat  subsegmental atelectasis or early pneumonia. A follow-up PA and lateral chest x-ray is recommended.      Left Heart Catheterization with Coronary Angiography Report  Kaitlin [name]  80 y.o.  female  1927/02/27  Procedure Date: 01/26/2014  Referring Physician: Irish Lack, MD  Primary Cardiologist: HWBSmith, III, MD  INDICATIONS: NSTEMI  PROCEDURE: 1. Left heart cath; 2.Coronary angiography; 3. Left ventriculogram  CONSENT:  The risks, benefits, and details of the procedure were explained in detail to the patient. Risks including death, stroke, heart attack, kidney injury, allergy, limb ischemia, bleeding and radiation injury were discussed. The patient verbalized understanding and wanted to proceed. Informed written consent was obtained.  PROCEDURE TECHNIQUE: After Xylocaine anesthesia a 5 F sheath was placed in the right radial artery with an angiocath and the modified Seldinger technique. Coronary angiography was done using a 5 F JL3.5 and JR4 catheter. Left ventriculography was done using the JR4 catheter and hand injection.  Hemostasis achieved with a Wrist Band.  CONTRAST: Total of 100 cc.  COMPLICATIONS: none  HEMODYNAMICS: Aortic pressure 143/71 mmHg; LV pressure 158/1 mmHg; LVEDP 20 mm mercury;  ANGIOGRAPHIC DATA: The left main coronary artery is normal..  The left anterior descending artery is widely patent. There is proximal vessel ectasia. A large first diagonal and the continuation of the LAD are normal. The LAD wraps around the left ventricular apex..  The left circumflex artery is widely patent. 3 obtuse marginal branches arise from the circumflex artery. The first obtuse marginal contains ostial 50-60% stenosis. The proximal to mid circumflex is ectatic.  The right coronary artery is normal.  LEFT VENTRICULOGRAM: Left ventricular angiogram was done in the 30 RAO projection and revealed ventricular ectopy but normal contractility with an EF greater than 70%.  IMPRESSIONS: 1.  Widely patent coronary arteries.  2. Normal left ventricular function  3. Elevated troponins, due to an unknown mechanism, but likely related to either coronary artery spasm or transient thrombosis.  RECOMMENDATION: Medical therapy of anxiety disorder.  Medical therapy of blood pressure.  Risk factor modification.    Discharge Medications     Medication List         ALPRAZolam 0.25 MG tablet  Commonly known as:  XANAX  Take 0.125 mg by mouth at bedtime as needed. For sleep     aspirin 81 MG EC tablet  Take 1 tablet (81 mg total) by mouth daily.     Fish Oil 1000 MG Caps  Take 2,000 mg by mouth daily.     HYDROcodone-acetaminophen 5-325 MG per tablet  Commonly known as:  NORCO/VICODIN  Take 1 tablet by mouth every 6 (six) hours as needed for moderate pain.     nitroGLYCERIN 0.4 MG SL tablet  Commonly known as:  NITROSTAT  Place 1 tablet (0.4 mg total) under the tongue every 5 (five) minutes x 3 doses as needed for chest pain.     PROBIOTIC DAILY PO  Take 1 capsule by mouth daily.     SUPER B COMPLEX Tabs  Take 1 tablet by mouth daily.     Vitamin D 2000 UNITS  Caps  Take 2,000 Units by mouth daily.        Disposition   The patient will be discharged in stable condition to home. Discharge Orders   Future Appointments Provider Department Dept Phone   02/09/2014 11:45 AM Imogene Burn, PA-C [facility] [phone]   Future Orders Complete By Expires   Amb Referral to Cardiac Rehabilitation  As directed      Follow-up Information   Follow up with [place] [facility]     On 02/09/2014. (PCP Dr. Verl Blalock 02/09/14 at 3:30pm )    Contact information:   [place] [address] [phone]        Duration of Discharge Encounter: Greater than 30 minutes including physician and PA time.  Signed, Perry Mount PA-C 01/27/2014, 9:41 AM

## 2014-02-08 DIAGNOSIS — R079 Chest pain, unspecified: Secondary | ICD-10-CM | POA: Diagnosis not present

## 2014-02-08 DIAGNOSIS — M545 Low back pain, unspecified: Secondary | ICD-10-CM | POA: Diagnosis not present

## 2014-02-09 ENCOUNTER — Encounter: Payer: Self-pay | Admitting: Physician Assistant

## 2014-02-09 ENCOUNTER — Ambulatory Visit (INDEPENDENT_AMBULATORY_CARE_PROVIDER_SITE_OTHER): Payer: Medicare Other | Admitting: Physician Assistant

## 2014-02-09 VITALS — BP 140/80 | HR 60 | Ht 66.0 in | Wt 117.0 lb

## 2014-02-09 DIAGNOSIS — I4891 Unspecified atrial fibrillation: Secondary | ICD-10-CM

## 2014-02-09 DIAGNOSIS — K589 Irritable bowel syndrome without diarrhea: Secondary | ICD-10-CM

## 2014-02-09 DIAGNOSIS — I214 Non-ST elevation (NSTEMI) myocardial infarction: Secondary | ICD-10-CM | POA: Diagnosis not present

## 2014-02-09 NOTE — Assessment & Plan Note (Signed)
Patient in sinus rhythm with sinus arrhythmia and PVCs. Followup with Dr. Caryl Comes when agreeable to pacemaker

## 2014-02-09 NOTE — Progress Notes (Signed)
HPI:  This is an 80 year old female patient of Dr. [name] who presented on 01/25/14 with NSTEMI status post-cath revealing normal coronary arteries. She had elevated troponins do to an unknown mechanism but likely related to coronary artery spasm or transient thrombus. She also has a history of tachycardia bradycardia syndrome documented with episodes of atrial fibrillation and postconversion pauses greater than 4 seconds. Pacemaker therapy was recommended but the patient refused. Beta blocker was discontinued because of these postconversion pauses. She's also refused anticoagulation in the past because she's claims to be an easy bleeder. She maintained normal sinus rhythm with PACs in the hospital. Lipid profile was normal and in the absence of CAD she did not go home on a statin. Controlling her blood pressure and anxiety disorder was recommended.  Patient comes in today complaining of this abdominal and back pain associated with fever and chills that she has had for many years and is seeing multiple doctors who could not diagnose her complaints. Her pain is severe and causes much anxiety which she thinks contributed to her heart attack. She was doing well since her hospitalization until yesterday when she had an episode of the abdominal pain. She does have diverticulitis and chronic diarrhea. I've asked her to followup with Dr. Carlean Purl concerning this. She's had no chest pain, palpitations, dyspnea, dizziness or presyncope. She is still thinking about the pacemaker. Her daughter would like her to have it but the patient is still reluctant.   Allergies:  -- Nizatidine   -- Codeine -- Rash  -- Morphine And Related -- Rash  Current Outpatient Prescriptions on File Prior to Visit: ALPRAZolam (XANAX) 0.25 MG tablet, Take 0.125 mg by mouth at bedtime as needed. For sleep, Disp: , Rfl:  aspirin EC 81 MG EC tablet, Take 1 tablet (81 mg total) by mouth daily., Disp: , Rfl:  B Complex-C (SUPER B COMPLEX) TABS,  Take 1 tablet by mouth daily.  , Disp: , Rfl:  Cholecalciferol (VITAMIN D) 2000 UNITS CAPS, Take 2,000 Units by mouth daily. , Disp: , Rfl:  HYDROcodone-acetaminophen (NORCO/VICODIN) 5-325 MG per tablet, Take 1 tablet by mouth every 6 (six) hours as needed for moderate pain., Disp: , Rfl:  nitroGLYCERIN (NITROSTAT) 0.4 MG SL tablet, Place 1 tablet (0.4 mg total) under the tongue every 5 (five) minutes x 3 doses as needed for chest pain., Disp: 25 tablet, Rfl: 12 Omega-3 Fatty Acids (FISH OIL) 1000 MG CAPS, Take 2,000 mg by mouth daily. , Disp: , Rfl:  Probiotic Product (PROBIOTIC DAILY PO), Take 1 capsule by mouth daily., Disp: , Rfl:   No current facility-administered medications on file prior to visit.   Past Medical History:   Diverticulosis                                               History of colon polyps                                      GERD (gastroesophageal reflux disease)                       Osteoarthritis  Anxiety and depression                                       IBS (irritable bowel syndrome)                               Mitral valve prolapse                                        Insomnia                                                     Peripheral neuropathy                                        Atrial fibrillation                                          Tachy-brady syndrome                                           Comment:a. Post termination of 4 seconds - refused PPM.   Lumbar back pain                                             Laryngeal trauma                                               Comment:penetration   Fever, recurrent                                             Chest pain                                                     Comment:a. 03/2005 MV: Ef 79%, no ischemia.  Past Surgical History:   CHOLECYSTECTOMY                                               LAPAROTOMY  BACK SURGERY                                                  ABDOMINAL HYSTERECTOMY                                        COLONOSCOPY                                      05/17/2010       Comment:diverticulosis   UPPER GASTROINTESTINAL ENDOSCOPY                 02/03/2007      Comment:normal  Review of patient's family history indicates:   Thyroid cancer                 Sister                   Prostate cancer                                           Comment: grandfather   Diabetes                       Father                   Heart disease                  Mother                   Heart disease                  Sister                   Colon cancer                   Neg Hx                   Social History   Marital Status: Widowed             Spouse Name:                      Years of Education:                 Number of children: 6           Occupational History Occupation          Fish farm manager            Comment              Retired                                   Social History Main Topics   Smoking Status: Never Smoker                     Smokeless Status: Never Used  Alcohol Use: No             Drug Use: No             Sexual Activity: Not on file        Other Topics            Concern   None on file  Social History Narrative   None on file    ROS: See history of present illness otherwise negative   PHYSICAL EXAM: Well-nournished, in no acute distress, anxious.  Neck: No JVD, HJR, Bruit, or thyroid enlargement  Lungs: No tachypnea, clear without wheezing, rales, or rhonchi  Cardiovascular: Irregular, PMI not displaced, heart sounds normal, no murmurs, gallops, bruit, thrill, or heave.  Abdomen: BS normal. Soft without organomegaly, masses, lesions or tenderness.  Extremities: Right arm without hematoma or hemorrhage at cath site good radial and brachial pulses, lower extremities without cyanosis, clubbing or edema. Good  distal pulses bilateral  SKin: Warm, no lesions or rashes   Musculoskeletal: No deformities  Neuro: no focal signs  BP 140/80  Pulse 60  Ht 5\' 6"  (1.676 m)  Wt 117 lb (53.071 kg)  BMI 18.89 kg/m2    EKG: Sinus tachycardia with occasional PVCs and fusion complexes with incomplete right bundle branch block  Cardiac catheterization 01/26/14 LEFT VENTRICULOGRAM: Left ventricular angiogram was done in the 30 RAO projection and revealed ventricular ectopy but normal contractility with an EF greater than 70%.   IMPRESSIONS: 1. Widely patent coronary arteries.   2. Normal left ventricular function   3. Elevated troponins, due to an unknown mechanism, but likely related to either coronary artery spasm or transient thrombosis.   RECOMMENDATION: Medical therapy of anxiety disorder.  Medical therapy of blood pressure.   Risk factor modification

## 2014-02-09 NOTE — Patient Instructions (Signed)
Your physician recommends that you schedule a follow-up appointment in: [place] 4-6 WEEKS  Your physician recommends that you continue on your current medications as directed. Please refer to the Current Medication list given to you today.

## 2014-02-09 NOTE — Assessment & Plan Note (Signed)
Patient had recent MI felt secondary to either coronary spasm or transient thrombus. Cardiac cath showed normal coronary arteries and normal LV function. Continue medical therapy including controlling her anxiety and blood pressure. I told her when she has the pain to take an extra Xanax to help calm her. She usually only takes this at night to help her sleep.

## 2014-02-09 NOTE — Assessment & Plan Note (Signed)
Followup with Dr. Carlean Purl.

## 2014-03-18 ENCOUNTER — Telehealth: Payer: Self-pay | Admitting: Interventional Cardiology

## 2014-03-18 ENCOUNTER — Emergency Department (HOSPITAL_COMMUNITY): Payer: Medicare Other

## 2014-03-18 ENCOUNTER — Emergency Department (HOSPITAL_COMMUNITY)
Admission: EM | Admit: 2014-03-18 | Discharge: 2014-03-18 | Disposition: A | Discharge status: Home / Self Care | Payer: Medicare Other | Attending: Emergency Medicine | Admitting: Emergency Medicine

## 2014-03-18 ENCOUNTER — Encounter (HOSPITAL_COMMUNITY): Payer: Self-pay | Admitting: Emergency Medicine

## 2014-03-18 DIAGNOSIS — F411 Generalized anxiety disorder: Secondary | ICD-10-CM | POA: Diagnosis not present

## 2014-03-18 DIAGNOSIS — M25519 Pain in unspecified shoulder: Secondary | ICD-10-CM | POA: Insufficient documentation

## 2014-03-18 DIAGNOSIS — Z860100 Personal history of colon polyps, unspecified: Secondary | ICD-10-CM | POA: Insufficient documentation

## 2014-03-18 DIAGNOSIS — M1990 Unspecified osteoarthritis, unspecified site: Secondary | ICD-10-CM | POA: Insufficient documentation

## 2014-03-18 DIAGNOSIS — I495 Sick sinus syndrome: Secondary | ICD-10-CM

## 2014-03-18 DIAGNOSIS — I4891 Unspecified atrial fibrillation: Secondary | ICD-10-CM | POA: Diagnosis not present

## 2014-03-18 DIAGNOSIS — Z9889 Other specified postprocedural states: Secondary | ICD-10-CM | POA: Insufficient documentation

## 2014-03-18 DIAGNOSIS — Z8679 Personal history of other diseases of the circulatory system: Secondary | ICD-10-CM | POA: Insufficient documentation

## 2014-03-18 DIAGNOSIS — M545 Low back pain, unspecified: Secondary | ICD-10-CM | POA: Diagnosis not present

## 2014-03-18 DIAGNOSIS — F3289 Other specified depressive episodes: Secondary | ICD-10-CM | POA: Insufficient documentation

## 2014-03-18 DIAGNOSIS — Z7982 Long term (current) use of aspirin: Secondary | ICD-10-CM | POA: Insufficient documentation

## 2014-03-18 DIAGNOSIS — M549 Dorsalgia, unspecified: Secondary | ICD-10-CM

## 2014-03-18 DIAGNOSIS — F329 Major depressive disorder, single episode, unspecified: Secondary | ICD-10-CM | POA: Diagnosis not present

## 2014-03-18 DIAGNOSIS — M25512 Pain in left shoulder: Secondary | ICD-10-CM

## 2014-03-18 DIAGNOSIS — J449 Chronic obstructive pulmonary disease, unspecified: Secondary | ICD-10-CM | POA: Diagnosis not present

## 2014-03-18 DIAGNOSIS — Z79899 Other long term (current) drug therapy: Secondary | ICD-10-CM | POA: Insufficient documentation

## 2014-03-18 DIAGNOSIS — R0602 Shortness of breath: Secondary | ICD-10-CM | POA: Insufficient documentation

## 2014-03-18 DIAGNOSIS — I252 Old myocardial infarction: Secondary | ICD-10-CM | POA: Insufficient documentation

## 2014-03-18 DIAGNOSIS — Z8719 Personal history of other diseases of the digestive system: Secondary | ICD-10-CM | POA: Insufficient documentation

## 2014-03-18 DIAGNOSIS — Z87828 Personal history of other (healed) physical injury and trauma: Secondary | ICD-10-CM | POA: Diagnosis not present

## 2014-03-18 LAB — BASIC METABOLIC PANEL
BUN: 23 mg/dL (ref 6–23)
CO2: 28 mEq/L (ref 19–32)
Calcium: 10.1 mg/dL (ref 8.4–10.5)
Chloride: 98 mEq/L (ref 96–112)
Creatinine, Ser: 0.64 mg/dL (ref 0.50–1.10)
GFR calc Af Amer: >90 mL/min (ref >90)
GFR calc non Af Amer: 80 mL/min — ABNORMAL LOW (ref >90)
Glucose, Bld: 103 mg/dL — ABNORMAL HIGH (ref 70–99)
Potassium: 4.2 mEq/L (ref 3.7–5.3)
Sodium: 139 mEq/L (ref 137–147)

## 2014-03-18 LAB — CBC
HCT: 45.9 % (ref 36.0–46.0)
Hemoglobin: 15.7 g/dL — ABNORMAL HIGH (ref 12.0–15.0)
MCH: 31.1 pg (ref 26.0–34.0)
MCHC: 34.2 g/dL (ref 30.0–36.0)
MCV: 90.9 fL (ref 78.0–100.0)
Platelets: 178 10*3/uL (ref 150–400)
RBC: 5.05 MIL/uL (ref 3.87–5.11)
RDW: 13.2 % (ref 11.5–15.5)
WBC: 4.8 10*3/uL (ref 4.0–10.5)

## 2014-03-18 LAB — I-STAT TROPONIN, ED: Troponin i, poc: 0.01 ng/mL (ref 0.00–0.08)

## 2014-03-18 LAB — PRO B NATRIURETIC PEPTIDE: Pro B Natriuretic peptide (BNP): 354.3 pg/mL (ref 0–450)

## 2014-03-18 MED ORDER — ASPIRIN 81 MG PO CHEW
324.0000 mg | CHEWABLE_TABLET | Freq: Once | ORAL | Status: AC
  Administered 2014-03-18: 243 mg via ORAL
  Filled 2014-03-18: qty 4

## 2014-03-18 NOTE — Telephone Encounter (Signed)
New message     Pt had heart attack this April--early this am she had numbness around her mouth----lft arm tingling/achey.  She took a pain pill and it did not help but a nitro did help.  Pt did not have any chest pain.  Now she feels jittery and her stomach hurts--nausea.  These were the same symptoms when she had a heart attack
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
## 2014-03-18 NOTE — Discharge Instructions (Signed)
Return to the ED with any concerns including difficulty breathing, chest pain, leg swelling, fainting, decreased level of alertness/lethargy, or any other alarming symptoms °

## 2014-03-18 NOTE — ED Notes (Signed)
Pt states that she began having shoulder and back pain with numbness and tingling in arm and around her mouth. Pt states that she had some SOB as well. Pt states that this is similar to how she felt when she had an MI in April. Pt states that she took a nitro around 0130 and pain resolved and pt was able to go to sleep. Pt called Dr. [name] this morning and was told to come here.
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
## 2014-03-18 NOTE — ED Notes (Signed)
Cardiologist at bedside.  

## 2014-03-18 NOTE — Telephone Encounter (Signed)
Daughter calling stating that she is with her mother.  States her mother woke up at 1:30 AM with numbness around mouth, pain in (L) shoulder,(L) arm, and (L) side of back and pain in her throat. She took a pain medication without relief.  She then took NTG with relief. Currently she is still c/o of tingling and numbness around her mouth, SOB and is slightly dizzy and still has the pain in (L) shoulder.  BP 111/95.  Advised her to take her to [facility] ER.  Will notify Trish and Dr. [name].
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
## 2014-03-28 ENCOUNTER — Ambulatory Visit: Payer: Medicare Other | Admitting: Interventional Cardiology

## 2014-03-30 ENCOUNTER — Ambulatory Visit (INDEPENDENT_AMBULATORY_CARE_PROVIDER_SITE_OTHER): Payer: Medicare Other | Admitting: Interventional Cardiology

## 2014-03-30 ENCOUNTER — Encounter: Payer: Self-pay | Admitting: Interventional Cardiology

## 2014-03-30 ENCOUNTER — Telehealth: Payer: Self-pay

## 2014-03-30 VITALS — BP 140/56 | HR 80 | Wt 115.0 lb

## 2014-03-30 DIAGNOSIS — I10 Essential (primary) hypertension: Secondary | ICD-10-CM | POA: Diagnosis not present

## 2014-03-30 DIAGNOSIS — I059 Rheumatic mitral valve disease, unspecified: Secondary | ICD-10-CM | POA: Diagnosis not present

## 2014-03-30 DIAGNOSIS — I4891 Unspecified atrial fibrillation: Secondary | ICD-10-CM

## 2014-03-30 DIAGNOSIS — I214 Non-ST elevation (NSTEMI) myocardial infarction: Secondary | ICD-10-CM | POA: Diagnosis not present

## 2014-03-30 DIAGNOSIS — I495 Sick sinus syndrome: Secondary | ICD-10-CM

## 2014-03-30 NOTE — Telephone Encounter (Signed)
called to see if pt was avilable to come in [place] for appt @ 12pm with Dr.Smith.she sts that she cannot.advpt we will she her at 12.pt verbalized understanding.

## 2014-03-30 NOTE — Patient Instructions (Signed)
Your physician recommends that you continue on your current medications as directed. Please refer to the Current Medication list given to you today.  Your physician wants you to follow-up in: 6-9 months You will receive a reminder letter in the mail two months in advance. If you don't receive a letter, please call our office to schedule the follow-up appointment.  
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
## 2014-04-11 DIAGNOSIS — M999 Biomechanical lesion, unspecified: Secondary | ICD-10-CM | POA: Diagnosis not present

## 2014-04-11 DIAGNOSIS — M4716 Other spondylosis with myelopathy, lumbar region: Secondary | ICD-10-CM | POA: Diagnosis not present

## 2014-04-11 DIAGNOSIS — IMO0001 Reserved for inherently not codable concepts without codable children: Secondary | ICD-10-CM | POA: Diagnosis not present

## 2014-04-11 DIAGNOSIS — M5430 Sciatica, unspecified side: Secondary | ICD-10-CM | POA: Diagnosis not present

## 2014-04-11 DIAGNOSIS — M545 Low back pain, unspecified: Secondary | ICD-10-CM | POA: Diagnosis not present

## 2014-04-11 DIAGNOSIS — M5137 Other intervertebral disc degeneration, lumbosacral region: Secondary | ICD-10-CM | POA: Diagnosis not present

## 2014-04-14 DIAGNOSIS — IMO0001 Reserved for inherently not codable concepts without codable children: Secondary | ICD-10-CM | POA: Diagnosis not present

## 2014-04-14 DIAGNOSIS — M545 Low back pain, unspecified: Secondary | ICD-10-CM | POA: Diagnosis not present

## 2014-04-14 DIAGNOSIS — M4716 Other spondylosis with myelopathy, lumbar region: Secondary | ICD-10-CM | POA: Diagnosis not present

## 2014-04-14 DIAGNOSIS — M999 Biomechanical lesion, unspecified: Secondary | ICD-10-CM | POA: Diagnosis not present

## 2014-04-14 DIAGNOSIS — M5430 Sciatica, unspecified side: Secondary | ICD-10-CM | POA: Diagnosis not present

## 2014-04-14 DIAGNOSIS — M5137 Other intervertebral disc degeneration, lumbosacral region: Secondary | ICD-10-CM | POA: Diagnosis not present

## 2014-04-18 DIAGNOSIS — IMO0001 Reserved for inherently not codable concepts without codable children: Secondary | ICD-10-CM | POA: Diagnosis not present

## 2014-04-18 DIAGNOSIS — M5137 Other intervertebral disc degeneration, lumbosacral region: Secondary | ICD-10-CM | POA: Diagnosis not present

## 2014-04-18 DIAGNOSIS — M545 Low back pain, unspecified: Secondary | ICD-10-CM | POA: Diagnosis not present

## 2014-04-18 DIAGNOSIS — M999 Biomechanical lesion, unspecified: Secondary | ICD-10-CM | POA: Diagnosis not present

## 2014-04-18 DIAGNOSIS — M5430 Sciatica, unspecified side: Secondary | ICD-10-CM | POA: Diagnosis not present

## 2014-04-18 DIAGNOSIS — M4716 Other spondylosis with myelopathy, lumbar region: Secondary | ICD-10-CM | POA: Diagnosis not present

## 2014-04-21 DIAGNOSIS — IMO0001 Reserved for inherently not codable concepts without codable children: Secondary | ICD-10-CM | POA: Diagnosis not present

## 2014-04-21 DIAGNOSIS — M5430 Sciatica, unspecified side: Secondary | ICD-10-CM | POA: Diagnosis not present

## 2014-04-21 DIAGNOSIS — M545 Low back pain, unspecified: Secondary | ICD-10-CM | POA: Diagnosis not present

## 2014-04-21 DIAGNOSIS — M4716 Other spondylosis with myelopathy, lumbar region: Secondary | ICD-10-CM | POA: Diagnosis not present

## 2014-04-21 DIAGNOSIS — M5137 Other intervertebral disc degeneration, lumbosacral region: Secondary | ICD-10-CM | POA: Diagnosis not present

## 2014-04-21 DIAGNOSIS — M999 Biomechanical lesion, unspecified: Secondary | ICD-10-CM | POA: Diagnosis not present

## 2014-04-25 DIAGNOSIS — M545 Low back pain, unspecified: Secondary | ICD-10-CM | POA: Diagnosis not present

## 2014-04-25 DIAGNOSIS — M999 Biomechanical lesion, unspecified: Secondary | ICD-10-CM | POA: Diagnosis not present

## 2014-04-25 DIAGNOSIS — M5430 Sciatica, unspecified side: Secondary | ICD-10-CM | POA: Diagnosis not present

## 2014-04-25 DIAGNOSIS — M4716 Other spondylosis with myelopathy, lumbar region: Secondary | ICD-10-CM | POA: Diagnosis not present

## 2014-04-25 DIAGNOSIS — IMO0001 Reserved for inherently not codable concepts without codable children: Secondary | ICD-10-CM | POA: Diagnosis not present

## 2014-04-25 DIAGNOSIS — M5137 Other intervertebral disc degeneration, lumbosacral region: Secondary | ICD-10-CM | POA: Diagnosis not present

## 2014-04-27 DIAGNOSIS — M999 Biomechanical lesion, unspecified: Secondary | ICD-10-CM | POA: Diagnosis not present

## 2014-04-27 DIAGNOSIS — IMO0001 Reserved for inherently not codable concepts without codable children: Secondary | ICD-10-CM | POA: Diagnosis not present

## 2014-04-27 DIAGNOSIS — M545 Low back pain, unspecified: Secondary | ICD-10-CM | POA: Diagnosis not present

## 2014-04-27 DIAGNOSIS — M5137 Other intervertebral disc degeneration, lumbosacral region: Secondary | ICD-10-CM | POA: Diagnosis not present

## 2014-04-27 DIAGNOSIS — M4716 Other spondylosis with myelopathy, lumbar region: Secondary | ICD-10-CM | POA: Diagnosis not present

## 2014-04-27 DIAGNOSIS — M5430 Sciatica, unspecified side: Secondary | ICD-10-CM | POA: Diagnosis not present

## 2014-05-04 DIAGNOSIS — IMO0001 Reserved for inherently not codable concepts without codable children: Secondary | ICD-10-CM | POA: Diagnosis not present

## 2014-05-04 DIAGNOSIS — M545 Low back pain, unspecified: Secondary | ICD-10-CM | POA: Diagnosis not present

## 2014-05-04 DIAGNOSIS — M5430 Sciatica, unspecified side: Secondary | ICD-10-CM | POA: Diagnosis not present

## 2014-05-04 DIAGNOSIS — M999 Biomechanical lesion, unspecified: Secondary | ICD-10-CM | POA: Diagnosis not present

## 2014-05-04 DIAGNOSIS — M4716 Other spondylosis with myelopathy, lumbar region: Secondary | ICD-10-CM | POA: Diagnosis not present

## 2014-05-04 DIAGNOSIS — M5137 Other intervertebral disc degeneration, lumbosacral region: Secondary | ICD-10-CM | POA: Diagnosis not present

## 2014-05-05 ENCOUNTER — Telehealth: Payer: Self-pay | Admitting: Interventional Cardiology

## 2014-05-05 NOTE — Telephone Encounter (Signed)
New message           Pt would like lisa to give her a call / pt did not disclose any other info
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
## 2014-05-18 DIAGNOSIS — M999 Biomechanical lesion, unspecified: Secondary | ICD-10-CM | POA: Diagnosis not present

## 2014-05-18 DIAGNOSIS — M5430 Sciatica, unspecified side: Secondary | ICD-10-CM | POA: Diagnosis not present

## 2014-05-18 DIAGNOSIS — M545 Low back pain, unspecified: Secondary | ICD-10-CM | POA: Diagnosis not present

## 2014-05-18 DIAGNOSIS — M4716 Other spondylosis with myelopathy, lumbar region: Secondary | ICD-10-CM | POA: Diagnosis not present

## 2014-05-18 DIAGNOSIS — IMO0001 Reserved for inherently not codable concepts without codable children: Secondary | ICD-10-CM | POA: Diagnosis not present

## 2014-05-18 DIAGNOSIS — M5137 Other intervertebral disc degeneration, lumbosacral region: Secondary | ICD-10-CM | POA: Diagnosis not present

## 2014-05-26 DIAGNOSIS — M545 Low back pain, unspecified: Secondary | ICD-10-CM | POA: Diagnosis not present

## 2014-05-26 DIAGNOSIS — M4716 Other spondylosis with myelopathy, lumbar region: Secondary | ICD-10-CM | POA: Diagnosis not present

## 2014-05-26 DIAGNOSIS — IMO0001 Reserved for inherently not codable concepts without codable children: Secondary | ICD-10-CM | POA: Diagnosis not present

## 2014-05-26 DIAGNOSIS — M5137 Other intervertebral disc degeneration, lumbosacral region: Secondary | ICD-10-CM | POA: Diagnosis not present

## 2014-05-26 DIAGNOSIS — M5430 Sciatica, unspecified side: Secondary | ICD-10-CM | POA: Diagnosis not present

## 2014-05-26 DIAGNOSIS — M999 Biomechanical lesion, unspecified: Secondary | ICD-10-CM | POA: Diagnosis not present

## 2014-05-31 ENCOUNTER — Other Ambulatory Visit: Payer: Self-pay | Admitting: Internal Medicine

## 2014-05-31 ENCOUNTER — Ambulatory Visit
Admission: RE | Admit: 2014-05-31 | Discharge: 2014-05-31 | Disposition: A | Discharge status: Home / Self Care | Payer: Medicare Other | Source: Ambulatory Visit | Attending: Internal Medicine | Admitting: Internal Medicine

## 2014-05-31 DIAGNOSIS — M5137 Other intervertebral disc degeneration, lumbosacral region: Secondary | ICD-10-CM | POA: Diagnosis not present

## 2014-05-31 DIAGNOSIS — M47817 Spondylosis without myelopathy or radiculopathy, lumbosacral region: Secondary | ICD-10-CM | POA: Diagnosis not present

## 2014-05-31 DIAGNOSIS — G609 Hereditary and idiopathic neuropathy, unspecified: Secondary | ICD-10-CM | POA: Diagnosis not present

## 2014-06-01 DIAGNOSIS — M999 Biomechanical lesion, unspecified: Secondary | ICD-10-CM | POA: Diagnosis not present

## 2014-06-01 DIAGNOSIS — M5430 Sciatica, unspecified side: Secondary | ICD-10-CM | POA: Diagnosis not present

## 2014-06-01 DIAGNOSIS — M4716 Other spondylosis with myelopathy, lumbar region: Secondary | ICD-10-CM | POA: Diagnosis not present

## 2014-06-01 DIAGNOSIS — M545 Low back pain, unspecified: Secondary | ICD-10-CM | POA: Diagnosis not present

## 2014-06-01 DIAGNOSIS — M5137 Other intervertebral disc degeneration, lumbosacral region: Secondary | ICD-10-CM | POA: Diagnosis not present

## 2014-06-01 DIAGNOSIS — IMO0001 Reserved for inherently not codable concepts without codable children: Secondary | ICD-10-CM | POA: Diagnosis not present

## 2014-06-08 DIAGNOSIS — M999 Biomechanical lesion, unspecified: Secondary | ICD-10-CM | POA: Diagnosis not present

## 2014-06-08 DIAGNOSIS — M4716 Other spondylosis with myelopathy, lumbar region: Secondary | ICD-10-CM | POA: Diagnosis not present

## 2014-06-08 DIAGNOSIS — M545 Low back pain, unspecified: Secondary | ICD-10-CM | POA: Diagnosis not present

## 2014-06-08 DIAGNOSIS — M5430 Sciatica, unspecified side: Secondary | ICD-10-CM | POA: Diagnosis not present

## 2014-06-08 DIAGNOSIS — M5137 Other intervertebral disc degeneration, lumbosacral region: Secondary | ICD-10-CM | POA: Diagnosis not present

## 2014-06-08 DIAGNOSIS — IMO0001 Reserved for inherently not codable concepts without codable children: Secondary | ICD-10-CM | POA: Diagnosis not present

## 2014-06-14 ENCOUNTER — Emergency Department (HOSPITAL_COMMUNITY)
Admission: EM | Admit: 2014-06-14 | Discharge: 2014-06-14 | Disposition: A | Discharge status: Home / Self Care | Payer: Medicare Other | Attending: Emergency Medicine | Admitting: Emergency Medicine

## 2014-06-14 ENCOUNTER — Emergency Department (HOSPITAL_COMMUNITY): Payer: Medicare Other

## 2014-06-14 ENCOUNTER — Encounter (HOSPITAL_COMMUNITY): Payer: Self-pay | Admitting: Emergency Medicine

## 2014-06-14 DIAGNOSIS — R209 Unspecified disturbances of skin sensation: Secondary | ICD-10-CM | POA: Diagnosis not present

## 2014-06-14 DIAGNOSIS — I4891 Unspecified atrial fibrillation: Secondary | ICD-10-CM | POA: Diagnosis not present

## 2014-06-14 DIAGNOSIS — IMO0002 Reserved for concepts with insufficient information to code with codable children: Secondary | ICD-10-CM | POA: Diagnosis not present

## 2014-06-14 DIAGNOSIS — S79919A Unspecified injury of unspecified hip, initial encounter: Secondary | ICD-10-CM | POA: Insufficient documentation

## 2014-06-14 DIAGNOSIS — M25559 Pain in unspecified hip: Secondary | ICD-10-CM | POA: Diagnosis not present

## 2014-06-14 DIAGNOSIS — Z8719 Personal history of other diseases of the digestive system: Secondary | ICD-10-CM | POA: Insufficient documentation

## 2014-06-14 DIAGNOSIS — Y9389 Activity, other specified: Secondary | ICD-10-CM | POA: Diagnosis not present

## 2014-06-14 DIAGNOSIS — Z79899 Other long term (current) drug therapy: Secondary | ICD-10-CM | POA: Diagnosis not present

## 2014-06-14 DIAGNOSIS — M25552 Pain in left hip: Secondary | ICD-10-CM

## 2014-06-14 DIAGNOSIS — Y92410 Unspecified street and highway as the place of occurrence of the external cause: Secondary | ICD-10-CM | POA: Diagnosis not present

## 2014-06-14 DIAGNOSIS — Z8669 Personal history of other diseases of the nervous system and sense organs: Secondary | ICD-10-CM | POA: Insufficient documentation

## 2014-06-14 DIAGNOSIS — S79929A Unspecified injury of unspecified thigh, initial encounter: Secondary | ICD-10-CM | POA: Diagnosis not present

## 2014-06-14 DIAGNOSIS — Z87828 Personal history of other (healed) physical injury and trauma: Secondary | ICD-10-CM | POA: Insufficient documentation

## 2014-06-14 DIAGNOSIS — S298XXA Other specified injuries of thorax, initial encounter: Secondary | ICD-10-CM | POA: Diagnosis not present

## 2014-06-14 DIAGNOSIS — Z7982 Long term (current) use of aspirin: Secondary | ICD-10-CM | POA: Insufficient documentation

## 2014-06-14 DIAGNOSIS — Z8701 Personal history of pneumonia (recurrent): Secondary | ICD-10-CM | POA: Insufficient documentation

## 2014-06-14 DIAGNOSIS — R202 Paresthesia of skin: Secondary | ICD-10-CM

## 2014-06-14 DIAGNOSIS — M1990 Unspecified osteoarthritis, unspecified site: Secondary | ICD-10-CM | POA: Diagnosis not present

## 2014-06-14 DIAGNOSIS — Z8659 Personal history of other mental and behavioral disorders: Secondary | ICD-10-CM | POA: Diagnosis not present

## 2014-06-14 DIAGNOSIS — R110 Nausea: Secondary | ICD-10-CM | POA: Diagnosis not present

## 2014-06-14 DIAGNOSIS — M545 Low back pain, unspecified: Secondary | ICD-10-CM | POA: Diagnosis not present

## 2014-06-14 LAB — CBC WITH DIFFERENTIAL/PLATELET
BASOS ABS: 0 10*3/uL (ref 0.0–0.1)
BASOS PCT: 0 % (ref 0–1)
Eosinophils Absolute: 0 10*3/uL (ref 0.0–0.7)
Eosinophils Relative: 0 % (ref 0–5)
HEMATOCRIT: 41.3 % (ref 36.0–46.0)
Hemoglobin: 14 g/dL (ref 12.0–15.0)
Lymphocytes Relative: 10 % — ABNORMAL LOW (ref 12–46)
Lymphs Abs: 0.6 10*3/uL — ABNORMAL LOW (ref 0.7–4.0)
MCH: 30.4 pg (ref 26.0–34.0)
MCHC: 33.9 g/dL (ref 30.0–36.0)
MCV: 89.8 fL (ref 78.0–100.0)
MONO ABS: 0.3 10*3/uL (ref 0.1–1.0)
Monocytes Relative: 5 % (ref 3–12)
NEUTROS ABS: 5.1 10*3/uL (ref 1.7–7.7)
Neutrophils Relative %: 85 % — ABNORMAL HIGH (ref 43–77)
PLATELETS: 188 10*3/uL (ref 150–400)
RBC: 4.6 MIL/uL (ref 3.87–5.11)
RDW: 13.2 % (ref 11.5–15.5)
WBC: 6 10*3/uL (ref 4.0–10.5)

## 2014-06-14 LAB — BASIC METABOLIC PANEL
Anion gap: 14 (ref 5–15)
BUN: 15 mg/dL (ref 6–23)
CHLORIDE: 101 meq/L (ref 96–112)
CO2: 25 mEq/L (ref 19–32)
CREATININE: 0.54 mg/dL (ref 0.50–1.10)
Calcium: 9.7 mg/dL (ref 8.4–10.5)
GFR calc non Af Amer: 84 mL/min — ABNORMAL LOW (ref >90)
Glucose, Bld: 102 mg/dL — ABNORMAL HIGH (ref 70–99)
Potassium: 4.5 mEq/L (ref 3.7–5.3)
SODIUM: 140 meq/L (ref 137–147)

## 2014-06-14 LAB — TROPONIN I

## 2014-06-14 MED ORDER — METHYLPREDNISOLONE (PAK) 4 MG PO TABS: ORAL_TABLET | ORAL | Status: DC

## 2014-06-14 MED ORDER — OXYCODONE-ACETAMINOPHEN 5-325 MG PO TABS: 1.0000 | ORAL_TABLET | Freq: Four times a day (QID) | ORAL | Status: DC | PRN

## 2014-06-14 MED ORDER — FENTANYL CITRATE 0.05 MG/ML IJ SOLN
50.0000 ug | INTRAMUSCULAR | Status: DC | PRN
  Administered 2014-06-14: 50 ug via INTRAVENOUS
  Filled 2014-06-14: qty 2

## 2014-06-14 MED ORDER — ONDANSETRON HCL 4 MG/2ML IJ SOLN
4.0000 mg | Freq: Once | INTRAMUSCULAR | Status: AC
Start: 2014-06-14 — End: 2014-06-14
  Administered 2014-06-14: 4 mg via INTRAVENOUS
  Filled 2014-06-14: qty 2

## 2014-06-14 MED ORDER — OXYCODONE-ACETAMINOPHEN 5-325 MG PO TABS
1.0000 | ORAL_TABLET | Freq: Once | ORAL | Status: AC
  Administered 2014-06-14: 1 via ORAL
  Filled 2014-06-14: qty 1
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
## 2014-06-14 NOTE — ED Notes (Signed)
Bed: WA01 Expected date: 06/14/14 Expected time: 11:32 AM Means of arrival: Ambulance Comments: 80 yo left hip pain
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
## 2014-06-14 NOTE — ED Notes (Signed)
Patient transported to MRI 
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
## 2014-06-14 NOTE — Discharge Instructions (Signed)
Left arm pain/Chest Pain (Nonspecific)  You were evaluated for left arm numbness and symptoms that can be related to chest pain and heart attacks.  Your exam and work-up is reassuring.  You need to follow-up with your cardiologist ASAP and if you have recurrence of symptoms should be re-evaluated.  It is often hard to give a specific diagnosis for the cause of chest pain. There is always a chance that your pain could be related to something serious, such as a heart attack or a blood clot in the lungs. You need to follow up with your health care provider for further evaluation. CAUSES   Heartburn.  Pneumonia or bronchitis.  Anxiety or stress.  Inflammation around your heart (pericarditis) or lung (pleuritis or pleurisy).  A blood clot in the lung.  A collapsed lung (pneumothorax). It can develop suddenly on its own (spontaneous pneumothorax) or from trauma to the chest.  Shingles infection (herpes zoster virus). The chest wall is composed of bones, muscles, and cartilage. Any of these can be the source of the pain.  The bones can be bruised by injury.  The muscles or cartilage can be strained by coughing or overwork.  The cartilage can be affected by inflammation and become sore (costochondritis). DIAGNOSIS  Lab tests or other studies may be needed to find the cause of your pain. Your health care provider may have you take a test called an ambulatory electrocardiogram (ECG). An ECG records your heartbeat patterns over a 24-hour period. You may also have other tests, such as:  Transthoracic echocardiogram (TTE). During echocardiography, sound waves are used to evaluate how blood flows through your heart.  Transesophageal echocardiogram (TEE).  Cardiac monitoring. This allows your health care provider to monitor your heart rate and rhythm in real time.  Holter monitor. This is a portable device that records your heartbeat and can help diagnose heart arrhythmias. It allows your health  care provider to track your heart activity for several days, if needed.  Stress tests by exercise or by giving medicine that makes the heart beat faster. TREATMENT   Treatment depends on what may be causing your chest pain. Treatment may include:  Acid blockers for heartburn.  Anti-inflammatory medicine.  Pain medicine for inflammatory conditions.  Antibiotics if an infection is present.  You may be advised to change lifestyle habits. This includes stopping smoking and avoiding alcohol, caffeine, and chocolate.  You may be advised to keep your head raised (elevated) when sleeping. This reduces the chance of acid going backward from your stomach into your esophagus. Most of the time, nonspecific chest pain will improve within 2-3 days with rest and mild pain medicine.  HOME CARE INSTRUCTIONS   If antibiotics were prescribed, take them as directed. Finish them even if you start to feel better.  For the next few days, avoid physical activities that bring on chest pain. Continue physical activities as directed.  Do not use any tobacco products, including cigarettes, chewing tobacco, or electronic cigarettes.  Avoid drinking alcohol.  Only take medicine as directed by your health care provider.  Follow your health care provider's suggestions for further testing if your chest pain does not go away.  Keep any follow-up appointments you made. If you do not go to an appointment, you could develop lasting (chronic) problems with pain. If there is any problem keeping an appointment, call to reschedule. SEEK MEDICAL CARE IF:   Your chest pain does not go away, even after treatment.  You have a rash  with blisters on your chest.  You have a fever. SEEK IMMEDIATE MEDICAL CARE IF:   You have increased chest pain or pain that spreads to your arm, neck, jaw, back, or abdomen.  You have shortness of breath.  You have an increasing cough, or you cough up blood.  You have severe back or  abdominal pain.  You feel nauseous or vomit.  You have severe weakness.  You faint.  You have chills. This is an emergency. Do not wait to see if the pain will go away. Get medical help at once. Call your local emergency services (911 in [place] Do not drive yourself to the hospital. MAKE SURE YOU:   Understand these instructions.  Will watch your condition.  Will get help right away if you are not doing well or get worse. Document Released: 07/10/2005 Document Revised: 10/05/2013 Document Reviewed: 05/05/2008 [facility] Patient Information 2015 [place], [place]. This information is not intended to replace advice given to you by your health care provider. Make sure you discuss any questions you have with your health care provider. Musculoskeletal Pain Musculoskeletal pain is muscle and boney aches and pains. These pains can occur in any part of the body. Your caregiver may treat you without knowing the cause of the pain. They may treat you if blood or urine tests, X-rays, and other tests were normal.  CAUSES There is often not a definite cause or reason for these pains. These pains may be caused by a type of germ (virus). The discomfort may also come from overuse. Overuse includes working out too hard when your body is not fit. Boney aches also come from weather changes. Bone is sensitive to atmospheric pressure changes. HOME CARE INSTRUCTIONS   Ask when your test results will be ready. Make sure you get your test results.  Only take over-the-counter or prescription medicines for pain, discomfort, or fever as directed by your caregiver. If you were given medications for your condition, do not drive, operate machinery or power tools, or sign legal documents for 24 hours. Do not drink alcohol. Do not take sleeping pills or other medications that may interfere with treatment.  Continue all activities unless the activities cause more pain. When the pain lessens, slowly resume normal activities.  Gradually increase the intensity and duration of the activities or exercise.  During periods of severe pain, bed rest may be helpful. Lay or sit in any position that is comfortable.  Putting ice on the injured area.  Put ice in a bag.  Place a towel between your skin and the bag.  Leave the ice on for 15 to 20 minutes, 3 to 4 times a day.  Follow up with your caregiver for continued problems and no reason can be found for the pain. If the pain becomes worse or does not go away, it may be necessary to repeat tests or do additional testing. Your caregiver may need to look further for a possible cause. SEEK IMMEDIATE MEDICAL CARE IF:  You have pain that is getting worse and is not relieved by medications.  You develop chest pain that is associated with shortness or breath, sweating, feeling sick to your stomach (nauseous), or throw up (vomit).  Your pain becomes localized to the abdomen.  You develop any new symptoms that seem different or that concern you. MAKE SURE YOU:   Understand these instructions.  Will watch your condition.  Will get help right away if you are not doing well or get worse. Document Released:  09/30/2005 Document Revised: 12/23/2011 Document Reviewed: 06/04/2013 [facility] Patient Information 2015 [place], [place]. This information is not intended to replace advice given to you by your health care provider. Make sure you discuss any questions you have with your health care provider.

## 2014-06-14 NOTE — ED Notes (Signed)
Unable to obtain blood from patient at this time due to patient having testing. RN made aware

## 2014-06-15 DIAGNOSIS — IMO0001 Reserved for inherently not codable concepts without codable children: Secondary | ICD-10-CM | POA: Diagnosis not present

## 2014-06-15 DIAGNOSIS — M5430 Sciatica, unspecified side: Secondary | ICD-10-CM | POA: Diagnosis not present

## 2014-06-15 DIAGNOSIS — M4716 Other spondylosis with myelopathy, lumbar region: Secondary | ICD-10-CM | POA: Diagnosis not present

## 2014-06-15 DIAGNOSIS — M5137 Other intervertebral disc degeneration, lumbosacral region: Secondary | ICD-10-CM | POA: Diagnosis not present

## 2014-06-15 DIAGNOSIS — M999 Biomechanical lesion, unspecified: Secondary | ICD-10-CM | POA: Diagnosis not present

## 2014-06-15 DIAGNOSIS — M545 Low back pain, unspecified: Secondary | ICD-10-CM | POA: Diagnosis not present

## 2014-06-21 DIAGNOSIS — R079 Chest pain, unspecified: Secondary | ICD-10-CM | POA: Diagnosis not present

## 2014-06-21 DIAGNOSIS — R109 Unspecified abdominal pain: Secondary | ICD-10-CM | POA: Diagnosis not present

## 2014-06-21 DIAGNOSIS — M25559 Pain in unspecified hip: Secondary | ICD-10-CM | POA: Diagnosis not present

## 2014-06-22 ENCOUNTER — Emergency Department (HOSPITAL_COMMUNITY): Payer: Medicare Other

## 2014-06-22 ENCOUNTER — Ambulatory Visit (INDEPENDENT_AMBULATORY_CARE_PROVIDER_SITE_OTHER): Payer: Medicare Other | Admitting: Nurse Practitioner

## 2014-06-22 ENCOUNTER — Encounter: Payer: Self-pay | Admitting: Nurse Practitioner

## 2014-06-22 ENCOUNTER — Emergency Department (HOSPITAL_COMMUNITY)
Admission: EM | Admit: 2014-06-22 | Discharge: 2014-06-23 | Disposition: A | Discharge status: Home / Self Care | Payer: Medicare Other | Attending: Emergency Medicine | Admitting: Emergency Medicine

## 2014-06-22 ENCOUNTER — Encounter (HOSPITAL_COMMUNITY): Payer: Self-pay | Admitting: Emergency Medicine

## 2014-06-22 VITALS — BP 108/70 | HR 87 | Ht 65.0 in | Wt 108.1 lb

## 2014-06-22 DIAGNOSIS — Z860100 Personal history of colon polyps, unspecified: Secondary | ICD-10-CM | POA: Diagnosis not present

## 2014-06-22 DIAGNOSIS — Z8669 Personal history of other diseases of the nervous system and sense organs: Secondary | ICD-10-CM | POA: Insufficient documentation

## 2014-06-22 DIAGNOSIS — Z0389 Encounter for observation for other suspected diseases and conditions ruled out: Secondary | ICD-10-CM

## 2014-06-22 DIAGNOSIS — I498 Other specified cardiac arrhythmias: Secondary | ICD-10-CM | POA: Diagnosis not present

## 2014-06-22 DIAGNOSIS — F3289 Other specified depressive episodes: Secondary | ICD-10-CM | POA: Insufficient documentation

## 2014-06-22 DIAGNOSIS — Z8719 Personal history of other diseases of the digestive system: Secondary | ICD-10-CM | POA: Diagnosis not present

## 2014-06-22 DIAGNOSIS — M48061 Spinal stenosis, lumbar region without neurogenic claudication: Secondary | ICD-10-CM | POA: Diagnosis not present

## 2014-06-22 DIAGNOSIS — M1990 Unspecified osteoarthritis, unspecified site: Secondary | ICD-10-CM | POA: Insufficient documentation

## 2014-06-22 DIAGNOSIS — I4891 Unspecified atrial fibrillation: Secondary | ICD-10-CM | POA: Diagnosis not present

## 2014-06-22 DIAGNOSIS — M546 Pain in thoracic spine: Secondary | ICD-10-CM

## 2014-06-22 DIAGNOSIS — M5442 Lumbago with sciatica, left side: Secondary | ICD-10-CM

## 2014-06-22 DIAGNOSIS — Z7982 Long term (current) use of aspirin: Secondary | ICD-10-CM | POA: Diagnosis not present

## 2014-06-22 DIAGNOSIS — G8929 Other chronic pain: Secondary | ICD-10-CM

## 2014-06-22 DIAGNOSIS — F411 Generalized anxiety disorder: Secondary | ICD-10-CM | POA: Diagnosis not present

## 2014-06-22 DIAGNOSIS — Z9889 Other specified postprocedural states: Secondary | ICD-10-CM | POA: Insufficient documentation

## 2014-06-22 DIAGNOSIS — M545 Low back pain, unspecified: Secondary | ICD-10-CM | POA: Insufficient documentation

## 2014-06-22 DIAGNOSIS — M5430 Sciatica, unspecified side: Secondary | ICD-10-CM | POA: Insufficient documentation

## 2014-06-22 DIAGNOSIS — Z79899 Other long term (current) drug therapy: Secondary | ICD-10-CM | POA: Diagnosis not present

## 2014-06-22 DIAGNOSIS — M5137 Other intervertebral disc degeneration, lumbosacral region: Secondary | ICD-10-CM | POA: Diagnosis not present

## 2014-06-22 DIAGNOSIS — M47817 Spondylosis without myelopathy or radiculopathy, lumbosacral region: Secondary | ICD-10-CM | POA: Diagnosis not present

## 2014-06-22 DIAGNOSIS — M25559 Pain in unspecified hip: Secondary | ICD-10-CM | POA: Diagnosis not present

## 2014-06-22 DIAGNOSIS — IMO0001 Reserved for inherently not codable concepts without codable children: Secondary | ICD-10-CM

## 2014-06-22 DIAGNOSIS — F329 Major depressive disorder, single episode, unspecified: Secondary | ICD-10-CM | POA: Diagnosis not present

## 2014-06-22 LAB — CBC WITH DIFFERENTIAL/PLATELET
BASOS ABS: 0 10*3/uL (ref 0.0–0.1)
Basophils Relative: 0 % (ref 0–1)
EOS PCT: 0 % (ref 0–5)
Eosinophils Absolute: 0.1 10*3/uL (ref 0.0–0.7)
HCT: 42.4 % (ref 36.0–46.0)
Hemoglobin: 14.4 g/dL (ref 12.0–15.0)
Lymphocytes Relative: 23 % (ref 12–46)
Lymphs Abs: 2.6 10*3/uL (ref 0.7–4.0)
MCH: 30.3 pg (ref 26.0–34.0)
MCHC: 34 g/dL (ref 30.0–36.0)
MCV: 89.1 fL (ref 78.0–100.0)
Monocytes Absolute: 1.2 10*3/uL — ABNORMAL HIGH (ref 0.1–1.0)
Monocytes Relative: 10 % (ref 3–12)
Neutro Abs: 7.5 10*3/uL (ref 1.7–7.7)
Neutrophils Relative %: 67 % (ref 43–77)
Platelets: 249 10*3/uL (ref 150–400)
RBC: 4.76 MIL/uL (ref 3.87–5.11)
RDW: 13.4 % (ref 11.5–15.5)
WBC: 11.4 10*3/uL — ABNORMAL HIGH (ref 4.0–10.5)

## 2014-06-22 LAB — COMPREHENSIVE METABOLIC PANEL
ALT: 273 U/L — ABNORMAL HIGH (ref 0–35)
AST: 477 U/L — ABNORMAL HIGH (ref 0–37)
Albumin: 3.7 g/dL (ref 3.5–5.2)
Alkaline Phosphatase: 163 U/L — ABNORMAL HIGH (ref 39–117)
Anion gap: 14 (ref 5–15)
BILIRUBIN TOTAL: 1.3 mg/dL — AB (ref 0.3–1.2)
BUN: 28 mg/dL — ABNORMAL HIGH (ref 6–23)
CO2: 25 meq/L (ref 19–32)
CREATININE: 0.66 mg/dL (ref 0.50–1.10)
Calcium: 9.1 mg/dL (ref 8.4–10.5)
Chloride: 95 mEq/L — ABNORMAL LOW (ref 96–112)
GFR, EST NON AFRICAN AMERICAN: 79 mL/min — AB (ref >90)
Glucose, Bld: 141 mg/dL — ABNORMAL HIGH (ref 70–99)
Potassium: 4.2 mEq/L (ref 3.7–5.3)
Sodium: 134 mEq/L — ABNORMAL LOW (ref 137–147)
Total Protein: 6.8 g/dL (ref 6.0–8.3)

## 2014-06-22 LAB — LIPASE, BLOOD: Lipase: 24 U/L (ref 11–59)

## 2014-06-22 LAB — I-STAT TROPONIN, ED: TROPONIN I, POC: 0.01 ng/mL (ref 0.00–0.08)

## 2014-06-22 LAB — ACETAMINOPHEN LEVEL: Acetaminophen (Tylenol), Serum: <15 ug/mL (ref 10–30)

## 2014-06-22 MED ORDER — OXYCODONE-ACETAMINOPHEN 5-325 MG PO TABS
1.0000 | ORAL_TABLET | Freq: Once | ORAL | Status: DC
  Filled 2014-06-22: qty 1

## 2014-06-22 MED ORDER — LORAZEPAM 1 MG PO TABS
1.0000 mg | ORAL_TABLET | Freq: Once | ORAL | Status: AC
  Administered 2014-06-22: 1 mg via ORAL
  Filled 2014-06-22: qty 1

## 2014-06-22 MED ORDER — ONDANSETRON 4 MG PO TBDP
4.0000 mg | ORAL_TABLET | Freq: Once | ORAL | Status: AC
  Administered 2014-06-22: 4 mg via ORAL
  Filled 2014-06-22: qty 1

## 2014-06-22 MED ORDER — HYDROMORPHONE HCL PF 1 MG/ML IJ SOLN
2.0000 mg | Freq: Once | INTRAMUSCULAR | Status: AC
Start: 2014-06-22 — End: 2014-06-22
  Administered 2014-06-22: 2 mg via INTRAMUSCULAR
  Filled 2014-06-22: qty 2
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
## 2014-06-22 NOTE — ED Notes (Signed)
Pt c/o left hip pain into lower back; pt had MRI of hip that was negative but needs one of spine per family
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
## 2014-06-22 NOTE — ED Notes (Signed)
Pt waiting for MRI at this time 

## 2014-06-22 NOTE — ED Notes (Signed)
Pt to MRI at this time.

## 2014-06-23 MED ORDER — OXYCODONE HCL 5 MG PO TABS: 5.0000 mg | ORAL_TABLET | ORAL | Status: DC | PRN

## 2014-06-23 MED ORDER — ONDANSETRON 4 MG PO TBDP: ORAL_TABLET | ORAL | Status: DC

## 2014-06-23 MED ORDER — CYCLOBENZAPRINE HCL 10 MG PO TABS: 10.0000 mg | ORAL_TABLET | Freq: Three times a day (TID) | ORAL | Status: DC | PRN

## 2014-06-23 NOTE — Discharge Instructions (Signed)
Oxycodone and Flexeril as prescribed as needed for pain.  Zofran as prescribed as needed for nausea.  Be sure to followup with Dr. Carlean Purl in the next several days to have your liver functions and bile duct findings followed up.  Return to the emergency department if you develop worsening pain, high fever, or other new and concerning symptoms to   Back Pain, Adult Back pain is very common. The pain often gets better over time. The cause of back pain is usually not dangerous. Most people can learn to manage their back pain on their own.  HOME CARE   Stay active. Start with short walks on flat ground if you can. Try to walk farther each day.  Do not sit, drive, or stand in one place for more than 30 minutes. Do not stay in bed.  Do not avoid exercise or work. Activity can help your back heal faster.  Be careful when you bend or lift an object. Bend at your knees, keep the object close to you, and do not twist.  Sleep on a firm mattress. Lie on your side, and bend your knees. If you lie on your back, put a pillow under your knees.  Only take medicines as told by your doctor.  Put ice on the injured area.  Put ice in a plastic bag.  Place a towel between your skin and the bag.  Leave the ice on for 15-20 minutes, 03-04 times a day for the first 2 to 3 days. After that, you can switch between ice and heat packs.  Ask your doctor about back exercises or massage.  Avoid feeling anxious or stressed. Find good ways to deal with stress, such as exercise. GET HELP RIGHT AWAY IF:   Your pain does not go away with rest or medicine.  Your pain does not go away in 1 week.  You have new problems.  You do not feel well.  The pain spreads into your legs.  You cannot control when you poop (bowel movement) or pee (urinate).  Your arms or legs feel weak or lose feeling (numbness).  You feel sick to your stomach (nauseous) or throw up (vomit).  You have belly (abdominal) pain.  You  feel like you may pass out (faint). MAKE SURE YOU:   Understand these instructions.  Will watch your condition.  Will get help right away if you are not doing well or get worse. Document Released: 03/18/2008 Document Revised: 12/23/2011 Document Reviewed: 02/01/2014 [facility] Patient Information 2015 [place], [place]. This information is not intended to replace advice given to you by your health care provider. Make sure you discuss any questions you have with your health care provider.

## 2014-06-24 DIAGNOSIS — M5430 Sciatica, unspecified side: Secondary | ICD-10-CM | POA: Diagnosis not present

## 2014-06-24 DIAGNOSIS — M545 Low back pain, unspecified: Secondary | ICD-10-CM | POA: Diagnosis not present

## 2014-06-24 DIAGNOSIS — IMO0001 Reserved for inherently not codable concepts without codable children: Secondary | ICD-10-CM | POA: Diagnosis not present

## 2014-06-24 DIAGNOSIS — M4716 Other spondylosis with myelopathy, lumbar region: Secondary | ICD-10-CM | POA: Diagnosis not present

## 2014-06-24 DIAGNOSIS — M5137 Other intervertebral disc degeneration, lumbosacral region: Secondary | ICD-10-CM | POA: Diagnosis not present

## 2014-06-24 DIAGNOSIS — M999 Biomechanical lesion, unspecified: Secondary | ICD-10-CM | POA: Diagnosis not present

## 2014-06-28 DIAGNOSIS — M4310 Spondylolisthesis, site unspecified: Secondary | ICD-10-CM | POA: Diagnosis not present

## 2014-06-28 DIAGNOSIS — M549 Dorsalgia, unspecified: Secondary | ICD-10-CM | POA: Diagnosis not present

## 2014-06-28 DIAGNOSIS — M4120 Other idiopathic scoliosis, site unspecified: Secondary | ICD-10-CM | POA: Diagnosis not present

## 2014-06-28 DIAGNOSIS — M5137 Other intervertebral disc degeneration, lumbosacral region: Secondary | ICD-10-CM | POA: Diagnosis not present

## 2014-06-28 DIAGNOSIS — M47817 Spondylosis without myelopathy or radiculopathy, lumbosacral region: Secondary | ICD-10-CM | POA: Diagnosis not present

## 2014-06-29 DIAGNOSIS — M545 Low back pain, unspecified: Secondary | ICD-10-CM | POA: Diagnosis not present

## 2014-06-29 DIAGNOSIS — M25559 Pain in unspecified hip: Secondary | ICD-10-CM | POA: Diagnosis not present

## 2014-06-29 DIAGNOSIS — M461 Sacroiliitis, not elsewhere classified: Secondary | ICD-10-CM | POA: Diagnosis not present

## 2014-06-29 DIAGNOSIS — M76899 Other specified enthesopathies of unspecified lower limb, excluding foot: Secondary | ICD-10-CM | POA: Diagnosis not present

## 2014-06-30 DIAGNOSIS — M4716 Other spondylosis with myelopathy, lumbar region: Secondary | ICD-10-CM | POA: Diagnosis not present

## 2014-06-30 DIAGNOSIS — M545 Low back pain, unspecified: Secondary | ICD-10-CM | POA: Diagnosis not present

## 2014-06-30 DIAGNOSIS — M5430 Sciatica, unspecified side: Secondary | ICD-10-CM | POA: Diagnosis not present

## 2014-06-30 DIAGNOSIS — M5137 Other intervertebral disc degeneration, lumbosacral region: Secondary | ICD-10-CM | POA: Diagnosis not present

## 2014-06-30 DIAGNOSIS — IMO0001 Reserved for inherently not codable concepts without codable children: Secondary | ICD-10-CM | POA: Diagnosis not present

## 2014-06-30 DIAGNOSIS — M999 Biomechanical lesion, unspecified: Secondary | ICD-10-CM | POA: Diagnosis not present

## 2014-07-04 DIAGNOSIS — M76899 Other specified enthesopathies of unspecified lower limb, excluding foot: Secondary | ICD-10-CM | POA: Diagnosis not present

## 2014-07-06 DIAGNOSIS — M4716 Other spondylosis with myelopathy, lumbar region: Secondary | ICD-10-CM | POA: Diagnosis not present

## 2014-07-06 DIAGNOSIS — M545 Low back pain, unspecified: Secondary | ICD-10-CM | POA: Diagnosis not present

## 2014-07-06 DIAGNOSIS — M5430 Sciatica, unspecified side: Secondary | ICD-10-CM | POA: Diagnosis not present

## 2014-07-06 DIAGNOSIS — IMO0001 Reserved for inherently not codable concepts without codable children: Secondary | ICD-10-CM | POA: Diagnosis not present

## 2014-07-06 DIAGNOSIS — M5137 Other intervertebral disc degeneration, lumbosacral region: Secondary | ICD-10-CM | POA: Diagnosis not present

## 2014-07-06 DIAGNOSIS — M999 Biomechanical lesion, unspecified: Secondary | ICD-10-CM | POA: Diagnosis not present

## 2014-07-11 DIAGNOSIS — M999 Biomechanical lesion, unspecified: Secondary | ICD-10-CM | POA: Diagnosis not present

## 2014-07-11 DIAGNOSIS — M545 Low back pain, unspecified: Secondary | ICD-10-CM | POA: Diagnosis not present

## 2014-07-11 DIAGNOSIS — M5137 Other intervertebral disc degeneration, lumbosacral region: Secondary | ICD-10-CM | POA: Diagnosis not present

## 2014-07-11 DIAGNOSIS — IMO0001 Reserved for inherently not codable concepts without codable children: Secondary | ICD-10-CM | POA: Diagnosis not present

## 2014-07-12 DIAGNOSIS — F411 Generalized anxiety disorder: Secondary | ICD-10-CM | POA: Diagnosis not present

## 2014-07-12 DIAGNOSIS — E7800 Pure hypercholesterolemia, unspecified: Secondary | ICD-10-CM | POA: Diagnosis not present

## 2014-07-12 DIAGNOSIS — R634 Abnormal weight loss: Secondary | ICD-10-CM | POA: Diagnosis not present

## 2014-07-12 DIAGNOSIS — IMO0002 Reserved for concepts with insufficient information to code with codable children: Secondary | ICD-10-CM | POA: Diagnosis not present

## 2014-07-12 DIAGNOSIS — M47817 Spondylosis without myelopathy or radiculopathy, lumbosacral region: Secondary | ICD-10-CM | POA: Diagnosis not present

## 2014-07-12 DIAGNOSIS — I495 Sick sinus syndrome: Secondary | ICD-10-CM | POA: Diagnosis not present

## 2014-07-12 DIAGNOSIS — R1310 Dysphagia, unspecified: Secondary | ICD-10-CM | POA: Diagnosis not present

## 2014-07-13 DIAGNOSIS — M999 Biomechanical lesion, unspecified: Secondary | ICD-10-CM | POA: Diagnosis not present

## 2014-07-13 DIAGNOSIS — M5137 Other intervertebral disc degeneration, lumbosacral region: Secondary | ICD-10-CM | POA: Diagnosis not present

## 2014-07-13 DIAGNOSIS — M545 Low back pain, unspecified: Secondary | ICD-10-CM | POA: Diagnosis not present

## 2014-07-13 DIAGNOSIS — IMO0001 Reserved for inherently not codable concepts without codable children: Secondary | ICD-10-CM | POA: Diagnosis not present

## 2014-07-18 DIAGNOSIS — M5416 Radiculopathy, lumbar region: Secondary | ICD-10-CM | POA: Diagnosis not present

## 2014-07-18 DIAGNOSIS — M4306 Spondylolysis, lumbar region: Secondary | ICD-10-CM | POA: Diagnosis not present

## 2014-07-18 DIAGNOSIS — M545 Low back pain: Secondary | ICD-10-CM | POA: Diagnosis not present

## 2014-07-19 DIAGNOSIS — M5442 Lumbago with sciatica, left side: Secondary | ICD-10-CM | POA: Diagnosis not present

## 2014-07-19 DIAGNOSIS — M4806 Spinal stenosis, lumbar region: Secondary | ICD-10-CM | POA: Diagnosis not present

## 2014-07-19 DIAGNOSIS — M9904 Segmental and somatic dysfunction of sacral region: Secondary | ICD-10-CM | POA: Diagnosis not present

## 2014-07-19 DIAGNOSIS — M5136 Other intervertebral disc degeneration, lumbar region: Secondary | ICD-10-CM | POA: Diagnosis not present

## 2014-07-19 DIAGNOSIS — M791 Myalgia: Secondary | ICD-10-CM | POA: Diagnosis not present

## 2014-07-19 DIAGNOSIS — M9903 Segmental and somatic dysfunction of lumbar region: Secondary | ICD-10-CM | POA: Diagnosis not present

## 2014-07-19 DIAGNOSIS — M9905 Segmental and somatic dysfunction of pelvic region: Secondary | ICD-10-CM | POA: Diagnosis not present

## 2014-07-21 DIAGNOSIS — M791 Myalgia: Secondary | ICD-10-CM | POA: Diagnosis not present

## 2014-07-21 DIAGNOSIS — M4806 Spinal stenosis, lumbar region: Secondary | ICD-10-CM | POA: Diagnosis not present

## 2014-07-21 DIAGNOSIS — M9903 Segmental and somatic dysfunction of lumbar region: Secondary | ICD-10-CM | POA: Diagnosis not present

## 2014-07-21 DIAGNOSIS — M5136 Other intervertebral disc degeneration, lumbar region: Secondary | ICD-10-CM | POA: Diagnosis not present

## 2014-07-21 DIAGNOSIS — M9904 Segmental and somatic dysfunction of sacral region: Secondary | ICD-10-CM | POA: Diagnosis not present

## 2014-07-21 DIAGNOSIS — M5442 Lumbago with sciatica, left side: Secondary | ICD-10-CM | POA: Diagnosis not present

## 2014-07-21 DIAGNOSIS — M9905 Segmental and somatic dysfunction of pelvic region: Secondary | ICD-10-CM | POA: Diagnosis not present

## 2014-07-25 DIAGNOSIS — M9905 Segmental and somatic dysfunction of pelvic region: Secondary | ICD-10-CM | POA: Diagnosis not present

## 2014-07-25 DIAGNOSIS — M4806 Spinal stenosis, lumbar region: Secondary | ICD-10-CM | POA: Diagnosis not present

## 2014-07-25 DIAGNOSIS — M5136 Other intervertebral disc degeneration, lumbar region: Secondary | ICD-10-CM | POA: Diagnosis not present

## 2014-07-25 DIAGNOSIS — M5442 Lumbago with sciatica, left side: Secondary | ICD-10-CM | POA: Diagnosis not present

## 2014-07-25 DIAGNOSIS — M9903 Segmental and somatic dysfunction of lumbar region: Secondary | ICD-10-CM | POA: Diagnosis not present

## 2014-07-25 DIAGNOSIS — M9904 Segmental and somatic dysfunction of sacral region: Secondary | ICD-10-CM | POA: Diagnosis not present

## 2014-07-25 DIAGNOSIS — M791 Myalgia: Secondary | ICD-10-CM | POA: Diagnosis not present

## 2014-07-27 DIAGNOSIS — M5442 Lumbago with sciatica, left side: Secondary | ICD-10-CM | POA: Diagnosis not present

## 2014-07-27 DIAGNOSIS — M9905 Segmental and somatic dysfunction of pelvic region: Secondary | ICD-10-CM | POA: Diagnosis not present

## 2014-07-27 DIAGNOSIS — M9903 Segmental and somatic dysfunction of lumbar region: Secondary | ICD-10-CM | POA: Diagnosis not present

## 2014-07-27 DIAGNOSIS — M5136 Other intervertebral disc degeneration, lumbar region: Secondary | ICD-10-CM | POA: Diagnosis not present

## 2014-07-27 DIAGNOSIS — M4806 Spinal stenosis, lumbar region: Secondary | ICD-10-CM | POA: Diagnosis not present

## 2014-07-27 DIAGNOSIS — M9904 Segmental and somatic dysfunction of sacral region: Secondary | ICD-10-CM | POA: Diagnosis not present

## 2014-07-27 DIAGNOSIS — M791 Myalgia: Secondary | ICD-10-CM | POA: Diagnosis not present

## 2014-08-03 DIAGNOSIS — M4806 Spinal stenosis, lumbar region: Secondary | ICD-10-CM | POA: Diagnosis not present

## 2014-08-03 DIAGNOSIS — M9903 Segmental and somatic dysfunction of lumbar region: Secondary | ICD-10-CM | POA: Diagnosis not present

## 2014-08-03 DIAGNOSIS — M791 Myalgia: Secondary | ICD-10-CM | POA: Diagnosis not present

## 2014-08-03 DIAGNOSIS — M5136 Other intervertebral disc degeneration, lumbar region: Secondary | ICD-10-CM | POA: Diagnosis not present

## 2014-08-03 DIAGNOSIS — M9904 Segmental and somatic dysfunction of sacral region: Secondary | ICD-10-CM | POA: Diagnosis not present

## 2014-08-03 DIAGNOSIS — M5442 Lumbago with sciatica, left side: Secondary | ICD-10-CM | POA: Diagnosis not present

## 2014-08-03 DIAGNOSIS — M9905 Segmental and somatic dysfunction of pelvic region: Secondary | ICD-10-CM | POA: Diagnosis not present

## 2014-08-10 DIAGNOSIS — M791 Myalgia: Secondary | ICD-10-CM | POA: Diagnosis not present

## 2014-08-10 DIAGNOSIS — M4806 Spinal stenosis, lumbar region: Secondary | ICD-10-CM | POA: Diagnosis not present

## 2014-08-10 DIAGNOSIS — M5136 Other intervertebral disc degeneration, lumbar region: Secondary | ICD-10-CM | POA: Diagnosis not present

## 2014-08-10 DIAGNOSIS — M9904 Segmental and somatic dysfunction of sacral region: Secondary | ICD-10-CM | POA: Diagnosis not present

## 2014-08-10 DIAGNOSIS — M9903 Segmental and somatic dysfunction of lumbar region: Secondary | ICD-10-CM | POA: Diagnosis not present

## 2014-08-10 DIAGNOSIS — M5442 Lumbago with sciatica, left side: Secondary | ICD-10-CM | POA: Diagnosis not present

## 2014-08-10 DIAGNOSIS — M9905 Segmental and somatic dysfunction of pelvic region: Secondary | ICD-10-CM | POA: Diagnosis not present

## 2014-08-16 ENCOUNTER — Ambulatory Visit (INDEPENDENT_AMBULATORY_CARE_PROVIDER_SITE_OTHER): Payer: Medicare Other | Admitting: Internal Medicine

## 2014-08-16 ENCOUNTER — Encounter: Payer: Self-pay | Admitting: Internal Medicine

## 2014-08-16 VITALS — BP 122/72 | HR 84 | Ht 64.5 in | Wt 108.4 lb

## 2014-08-16 DIAGNOSIS — R634 Abnormal weight loss: Secondary | ICD-10-CM | POA: Diagnosis not present

## 2014-08-16 DIAGNOSIS — R1314 Dysphagia, pharyngoesophageal phase: Secondary | ICD-10-CM

## 2014-08-16 DIAGNOSIS — K219 Gastro-esophageal reflux disease without esophagitis: Secondary | ICD-10-CM

## 2014-08-16 NOTE — Patient Instructions (Signed)
Please ask your Son which PPI he prefers : Rabeprazole (Aciphex) or Pantoprazole (Protonix) and call and let us know so we can send it in for you.  Let us know if you would like for [place] to set up the Barium Swallow with Tablet test Dr. Carlean Purl recommends.    You have a follow up appointment with Dr Carlean Purl on Tuesday January 5th 2016 at 3:00pm.   I appreciate the opportunity to care for you.
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
## 2014-08-18 DIAGNOSIS — M9904 Segmental and somatic dysfunction of sacral region: Secondary | ICD-10-CM | POA: Diagnosis not present

## 2014-08-18 DIAGNOSIS — M791 Myalgia: Secondary | ICD-10-CM | POA: Diagnosis not present

## 2014-08-18 DIAGNOSIS — M5136 Other intervertebral disc degeneration, lumbar region: Secondary | ICD-10-CM | POA: Diagnosis not present

## 2014-08-18 DIAGNOSIS — M4806 Spinal stenosis, lumbar region: Secondary | ICD-10-CM | POA: Diagnosis not present

## 2014-08-18 DIAGNOSIS — M9903 Segmental and somatic dysfunction of lumbar region: Secondary | ICD-10-CM | POA: Diagnosis not present

## 2014-08-18 DIAGNOSIS — M9905 Segmental and somatic dysfunction of pelvic region: Secondary | ICD-10-CM | POA: Diagnosis not present

## 2014-08-18 DIAGNOSIS — M5442 Lumbago with sciatica, left side: Secondary | ICD-10-CM | POA: Diagnosis not present

## 2014-08-19 ENCOUNTER — Encounter: Payer: Self-pay | Admitting: Interventional Cardiology

## 2014-08-19 ENCOUNTER — Ambulatory Visit (INDEPENDENT_AMBULATORY_CARE_PROVIDER_SITE_OTHER): Payer: Medicare Other | Admitting: Interventional Cardiology

## 2014-08-19 VITALS — BP 102/60 | HR 52 | Ht 65.5 in | Wt 107.8 lb

## 2014-08-19 DIAGNOSIS — I480 Paroxysmal atrial fibrillation: Secondary | ICD-10-CM

## 2014-08-19 DIAGNOSIS — I214 Non-ST elevation (NSTEMI) myocardial infarction: Secondary | ICD-10-CM

## 2014-08-19 DIAGNOSIS — I495 Sick sinus syndrome: Secondary | ICD-10-CM
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
## 2014-08-19 NOTE — Patient Instructions (Signed)
Your physician recommends that you continue on your current medications as directed. Please refer to the Current Medication list given to you today.  Your physician wants you to follow-up in: 1 year with Dr.Smith You will receive a reminder letter in the mail two months in advance. If you don't receive a letter, please call our office to schedule the follow-up appointment.  

## 2014-08-24 DIAGNOSIS — M9905 Segmental and somatic dysfunction of pelvic region: Secondary | ICD-10-CM | POA: Diagnosis not present

## 2014-08-24 DIAGNOSIS — M5442 Lumbago with sciatica, left side: Secondary | ICD-10-CM | POA: Diagnosis not present

## 2014-08-24 DIAGNOSIS — M5136 Other intervertebral disc degeneration, lumbar region: Secondary | ICD-10-CM | POA: Diagnosis not present

## 2014-08-24 DIAGNOSIS — M9903 Segmental and somatic dysfunction of lumbar region: Secondary | ICD-10-CM | POA: Diagnosis not present

## 2014-08-24 DIAGNOSIS — M791 Myalgia: Secondary | ICD-10-CM | POA: Diagnosis not present

## 2014-08-24 DIAGNOSIS — M4806 Spinal stenosis, lumbar region: Secondary | ICD-10-CM | POA: Diagnosis not present

## 2014-08-24 DIAGNOSIS — M9904 Segmental and somatic dysfunction of sacral region: Secondary | ICD-10-CM | POA: Diagnosis not present

## 2014-08-31 DIAGNOSIS — M9903 Segmental and somatic dysfunction of lumbar region: Secondary | ICD-10-CM | POA: Diagnosis not present

## 2014-08-31 DIAGNOSIS — M5136 Other intervertebral disc degeneration, lumbar region: Secondary | ICD-10-CM | POA: Diagnosis not present

## 2014-08-31 DIAGNOSIS — M5442 Lumbago with sciatica, left side: Secondary | ICD-10-CM | POA: Diagnosis not present

## 2014-08-31 DIAGNOSIS — M4806 Spinal stenosis, lumbar region: Secondary | ICD-10-CM | POA: Diagnosis not present

## 2014-08-31 DIAGNOSIS — M791 Myalgia: Secondary | ICD-10-CM | POA: Diagnosis not present

## 2014-08-31 DIAGNOSIS — M9904 Segmental and somatic dysfunction of sacral region: Secondary | ICD-10-CM | POA: Diagnosis not present

## 2014-08-31 DIAGNOSIS — M9905 Segmental and somatic dysfunction of pelvic region: Secondary | ICD-10-CM | POA: Diagnosis not present

## 2014-09-15 ENCOUNTER — Telehealth: Payer: Self-pay

## 2014-09-15 NOTE — Telephone Encounter (Signed)
-----   Message from Gatha Mayer, MD sent at 09/14/2014  4:42 PM EST ----- Regarding: dysphagia f/u See that she is coming back in Jan for dysphagia  If she is willing I would like her to go ahead with a ba swallow and tablet this month

## 2014-09-15 NOTE — Telephone Encounter (Signed)
I contacted the patient.  She said her son passed away this week and she is not able to do BA swallow at this time.  She will call back if she wants to schedule.  "It's not a good time"

## 2014-09-20 ENCOUNTER — Telehealth: Payer: Self-pay | Admitting: Internal Medicine

## 2014-09-20 DIAGNOSIS — R1310 Dysphagia, unspecified: Secondary | ICD-10-CM

## 2014-09-20 DIAGNOSIS — M9904 Segmental and somatic dysfunction of sacral region: Secondary | ICD-10-CM | POA: Diagnosis not present

## 2014-09-20 DIAGNOSIS — M791 Myalgia: Secondary | ICD-10-CM | POA: Diagnosis not present

## 2014-09-20 DIAGNOSIS — M9905 Segmental and somatic dysfunction of pelvic region: Secondary | ICD-10-CM | POA: Diagnosis not present

## 2014-09-20 DIAGNOSIS — M9903 Segmental and somatic dysfunction of lumbar region: Secondary | ICD-10-CM | POA: Diagnosis not present

## 2014-09-20 DIAGNOSIS — M5442 Lumbago with sciatica, left side: Secondary | ICD-10-CM | POA: Diagnosis not present

## 2014-09-20 DIAGNOSIS — M5136 Other intervertebral disc degeneration, lumbar region: Secondary | ICD-10-CM | POA: Diagnosis not present

## 2014-09-20 DIAGNOSIS — M4806 Spinal stenosis, lumbar region: Secondary | ICD-10-CM | POA: Diagnosis not present

## 2014-09-20 NOTE — Telephone Encounter (Signed)
Patient is scheduled for 09/23/14 11:30 at [facility] .  She is notified that he needs to be NPO for 3 hours prior

## 2014-09-22 ENCOUNTER — Encounter (HOSPITAL_COMMUNITY): Payer: Self-pay | Admitting: Interventional Cardiology

## 2014-09-23 ENCOUNTER — Ambulatory Visit (HOSPITAL_COMMUNITY)
Admission: RE | Admit: 2014-09-23 | Discharge: 2014-09-23 | Disposition: A | Discharge status: Home / Self Care | Payer: Medicare Other | Source: Ambulatory Visit | Attending: Internal Medicine | Admitting: Internal Medicine

## 2014-09-23 DIAGNOSIS — M479 Spondylosis, unspecified: Secondary | ICD-10-CM | POA: Diagnosis not present

## 2014-09-23 DIAGNOSIS — R1310 Dysphagia, unspecified: Secondary | ICD-10-CM | POA: Insufficient documentation

## 2014-09-23 DIAGNOSIS — K224 Dyskinesia of esophagus: Secondary | ICD-10-CM | POA: Insufficient documentation

## 2014-09-23 DIAGNOSIS — K589 Irritable bowel syndrome without diarrhea: Secondary | ICD-10-CM | POA: Insufficient documentation

## 2014-09-23 DIAGNOSIS — K5790 Diverticulosis of intestine, part unspecified, without perforation or abscess without bleeding: Secondary | ICD-10-CM | POA: Diagnosis not present

## 2014-09-23 NOTE — Progress Notes (Signed)
Quick Note:  This shows spasm of the esophagus and slight aspiration No signs of cancer I will see her at f/u visit ______

## 2014-10-12 DIAGNOSIS — M5136 Other intervertebral disc degeneration, lumbar region: Secondary | ICD-10-CM | POA: Diagnosis not present

## 2014-10-12 DIAGNOSIS — M9905 Segmental and somatic dysfunction of pelvic region: Secondary | ICD-10-CM | POA: Diagnosis not present

## 2014-10-12 DIAGNOSIS — M791 Myalgia: Secondary | ICD-10-CM | POA: Diagnosis not present

## 2014-10-12 DIAGNOSIS — M9903 Segmental and somatic dysfunction of lumbar region: Secondary | ICD-10-CM | POA: Diagnosis not present

## 2014-10-12 DIAGNOSIS — M4806 Spinal stenosis, lumbar region: Secondary | ICD-10-CM | POA: Diagnosis not present

## 2014-10-12 DIAGNOSIS — M9904 Segmental and somatic dysfunction of sacral region: Secondary | ICD-10-CM | POA: Diagnosis not present

## 2014-10-12 DIAGNOSIS — M5442 Lumbago with sciatica, left side: Secondary | ICD-10-CM | POA: Diagnosis not present

## 2014-10-18 ENCOUNTER — Ambulatory Visit (INDEPENDENT_AMBULATORY_CARE_PROVIDER_SITE_OTHER): Payer: Medicare Other | Admitting: Internal Medicine

## 2014-10-18 ENCOUNTER — Encounter: Payer: Self-pay | Admitting: Internal Medicine

## 2014-10-18 VITALS — BP 100/60 | HR 76 | Ht 64.5 in | Wt 108.2 lb

## 2014-10-18 DIAGNOSIS — K224 Dyskinesia of esophagus: Secondary | ICD-10-CM

## 2014-10-18 DIAGNOSIS — T17998A Other foreign object in respiratory tract, part unspecified causing other injury, initial encounter: Secondary | ICD-10-CM

## 2014-10-18 DIAGNOSIS — R1313 Dysphagia, pharyngeal phase: Secondary | ICD-10-CM

## 2014-10-18 DIAGNOSIS — T17908A Unspecified foreign body in respiratory tract, part unspecified causing other injury, initial encounter: Secondary | ICD-10-CM

## 2014-10-18 NOTE — Progress Notes (Signed)
   Subjective:    Patient ID: Kaitlin [name], female    DOB: 1927/08/21, 81 y.o.   MRN: [id number]  HPI  The patient is here with her daughter for follow-up. She's been having some dysphagia problems to solids more than liquids. A barium swallow has demonstrated esophageal dysmotility. He also had some transient aspiration and penetration issues.  Unfortunately her son died last month. Complications of diabetes.  She continues to have her lower abdominal pain and chills as she has had off and on for years. We have never found the etiology and there've been no serious sequela I though she is miserable when she has these problems and is bothered by the lack of specificity regarding diagnosis. Medications, allergies, past medical history, past surgical history, family history and social history are reviewed and updated in the EMR.  Review of Systems As above    Objective:   Physical Exam Thin elderly white woman in no acute distress    Assessment & Plan:   1. Pharyngeal dysphagia   2. Aspiration into airway, initial encounter   3. Esophageal dysmotility     1. Evaluate oropharyngeal dysphagia and aspiration issues with a modified barium swallow 2. Dysphagia 3 diet is provided for treatment of esophageal dysmotility handout given 3. Avoid cold liquids and may use warm liquids to help with swallowing, she has noticed that that Axid provide some relief   CC: Dorian Heckle, MD

## 2014-10-18 NOTE — Patient Instructions (Signed)
You have been scheduled for a Barium Esophogram at [facility] (1st floor of the hospital) on 11/17/14 at 1:00pm. Please arrive 15 minutes prior to your appointment for registration.. If you need to reschedule for any reason, please contact radiology at [phone] to do so. __________________________________________________________________ A barium swallow is an examination that concentrates on views of the esophagus. This tends to be a double contrast exam (barium and two liquids which, when combined, create a gas to distend the wall of the oesophagus) or single contrast (non-ionic iodine based). The study is usually tailored to your symptoms so a good history is essential. Attention is paid during the study to the form, structure and configuration of the esophagus, looking for functional disorders (such as aspiration, dysphagia, achalasia, motility and reflux) EXAMINATION You may be asked to change into a gown, depending on the type of swallow being performed. A radiologist and radiographer will perform the procedure. The radiologist will advise you of the type of contrast selected for your procedure and direct you during the exam. You will be asked to stand, sit or lie in several different positions and to hold a small amount of fluid in your mouth before being asked to swallow while the imaging is performed .In some instances you may be asked to swallow barium coated marshmallows to assess the motility of a solid food bolus. The exam can be recorded as a digital or video fluoroscopy procedure. POST PROCEDURE It will take 1-2 days for the barium to pass through your system. To facilitate this, it is important, unless otherwise directed, to increase your fluids for the next 24-48hrs and to resume your normal diet.  This test typically takes about 30 minutes to perform. __________________________________________________________________________________  Today we are providing you with a  dysphagia diet , use level #3.   I appreciate the opportunity to care for you. Silvano Rusk, M.D., [facility]

## 2014-10-26 DIAGNOSIS — M9903 Segmental and somatic dysfunction of lumbar region: Secondary | ICD-10-CM | POA: Diagnosis not present

## 2014-10-26 DIAGNOSIS — M5136 Other intervertebral disc degeneration, lumbar region: Secondary | ICD-10-CM | POA: Diagnosis not present

## 2014-10-26 DIAGNOSIS — M5442 Lumbago with sciatica, left side: Secondary | ICD-10-CM | POA: Diagnosis not present

## 2014-10-26 DIAGNOSIS — M4806 Spinal stenosis, lumbar region: Secondary | ICD-10-CM | POA: Diagnosis not present

## 2014-10-26 DIAGNOSIS — M9905 Segmental and somatic dysfunction of pelvic region: Secondary | ICD-10-CM | POA: Diagnosis not present

## 2014-10-26 DIAGNOSIS — M9904 Segmental and somatic dysfunction of sacral region: Secondary | ICD-10-CM | POA: Diagnosis not present

## 2014-10-26 DIAGNOSIS — M791 Myalgia: Secondary | ICD-10-CM | POA: Diagnosis not present

## 2014-11-09 DIAGNOSIS — M791 Myalgia: Secondary | ICD-10-CM | POA: Diagnosis not present

## 2014-11-09 DIAGNOSIS — M5442 Lumbago with sciatica, left side: Secondary | ICD-10-CM | POA: Diagnosis not present

## 2014-11-09 DIAGNOSIS — M5136 Other intervertebral disc degeneration, lumbar region: Secondary | ICD-10-CM | POA: Diagnosis not present

## 2014-11-09 DIAGNOSIS — M9904 Segmental and somatic dysfunction of sacral region: Secondary | ICD-10-CM | POA: Diagnosis not present

## 2014-11-09 DIAGNOSIS — M4806 Spinal stenosis, lumbar region: Secondary | ICD-10-CM | POA: Diagnosis not present

## 2014-11-09 DIAGNOSIS — M9905 Segmental and somatic dysfunction of pelvic region: Secondary | ICD-10-CM | POA: Diagnosis not present

## 2014-11-09 DIAGNOSIS — M9903 Segmental and somatic dysfunction of lumbar region: Secondary | ICD-10-CM | POA: Diagnosis not present

## 2014-11-10 ENCOUNTER — Other Ambulatory Visit: Payer: Self-pay

## 2014-11-10 DIAGNOSIS — Z1231 Encounter for screening mammogram for malignant neoplasm of breast: Secondary | ICD-10-CM

## 2014-11-11 ENCOUNTER — Other Ambulatory Visit (HOSPITAL_COMMUNITY): Payer: Self-pay | Admitting: Internal Medicine

## 2014-11-11 DIAGNOSIS — R1310 Dysphagia, unspecified: Secondary | ICD-10-CM

## 2014-11-16 ENCOUNTER — Ambulatory Visit: Payer: Medicare Other

## 2014-11-17 ENCOUNTER — Ambulatory Visit (HOSPITAL_COMMUNITY)
Admission: RE | Admit: 2014-11-17 | Discharge: 2014-11-17 | Disposition: A | Discharge status: Home / Self Care | Payer: Medicare Other | Source: Ambulatory Visit | Attending: Internal Medicine | Admitting: Internal Medicine

## 2014-11-17 DIAGNOSIS — K589 Irritable bowel syndrome without diarrhea: Secondary | ICD-10-CM | POA: Insufficient documentation

## 2014-11-17 DIAGNOSIS — K224 Dyskinesia of esophagus: Secondary | ICD-10-CM | POA: Insufficient documentation

## 2014-11-17 DIAGNOSIS — K219 Gastro-esophageal reflux disease without esophagitis: Secondary | ICD-10-CM | POA: Insufficient documentation

## 2014-11-17 DIAGNOSIS — F419 Anxiety disorder, unspecified: Secondary | ICD-10-CM | POA: Insufficient documentation

## 2014-11-17 DIAGNOSIS — R1310 Dysphagia, unspecified: Secondary | ICD-10-CM | POA: Diagnosis not present

## 2014-11-17 DIAGNOSIS — R1313 Dysphagia, pharyngeal phase: Secondary | ICD-10-CM | POA: Insufficient documentation

## 2014-11-17 NOTE — Progress Notes (Signed)
Quick Note:  She should know from speech path that this was ok She has esophageal dysmotility  Let me know if she has any ? Otherwise f/u prn ______
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
## 2014-11-18 DIAGNOSIS — L820 Inflamed seborrheic keratosis: Secondary | ICD-10-CM | POA: Diagnosis not present

## 2014-11-18 DIAGNOSIS — D225 Melanocytic nevi of trunk: Secondary | ICD-10-CM | POA: Diagnosis not present

## 2014-11-23 DIAGNOSIS — M4806 Spinal stenosis, lumbar region: Secondary | ICD-10-CM | POA: Diagnosis not present

## 2014-11-23 DIAGNOSIS — M9903 Segmental and somatic dysfunction of lumbar region: Secondary | ICD-10-CM | POA: Diagnosis not present

## 2014-11-23 DIAGNOSIS — M791 Myalgia: Secondary | ICD-10-CM | POA: Diagnosis not present

## 2014-11-23 DIAGNOSIS — M9905 Segmental and somatic dysfunction of pelvic region: Secondary | ICD-10-CM | POA: Diagnosis not present

## 2014-11-23 DIAGNOSIS — M5442 Lumbago with sciatica, left side: Secondary | ICD-10-CM | POA: Diagnosis not present

## 2014-11-23 DIAGNOSIS — M9904 Segmental and somatic dysfunction of sacral region: Secondary | ICD-10-CM | POA: Diagnosis not present

## 2014-11-23 DIAGNOSIS — M5136 Other intervertebral disc degeneration, lumbar region: Secondary | ICD-10-CM | POA: Diagnosis not present

## 2014-11-30 ENCOUNTER — Telehealth (HOSPITAL_COMMUNITY): Payer: Self-pay | Admitting: Cardiac Rehabilitation

## 2014-11-30 NOTE — Telephone Encounter (Signed)
Pt has been contacted multiple times to enroll in cardiac rehab.  Pt does not show interest in enrolling.  Dr. Irish Lack made aware.

## 2014-12-19 DIAGNOSIS — R509 Fever, unspecified: Secondary | ICD-10-CM | POA: Diagnosis not present

## 2014-12-19 DIAGNOSIS — M5417 Radiculopathy, lumbosacral region: Secondary | ICD-10-CM | POA: Diagnosis not present

## 2014-12-19 DIAGNOSIS — R350 Frequency of micturition: Secondary | ICD-10-CM | POA: Diagnosis not present

## 2014-12-19 DIAGNOSIS — F419 Anxiety disorder, unspecified: Secondary | ICD-10-CM | POA: Diagnosis not present

## 2014-12-19 DIAGNOSIS — R1310 Dysphagia, unspecified: Secondary | ICD-10-CM | POA: Diagnosis not present

## 2014-12-19 DIAGNOSIS — M4727 Other spondylosis with radiculopathy, lumbosacral region: Secondary | ICD-10-CM | POA: Diagnosis not present

## 2014-12-19 DIAGNOSIS — D472 Monoclonal gammopathy: Secondary | ICD-10-CM | POA: Diagnosis not present

## 2014-12-19 DIAGNOSIS — I10 Essential (primary) hypertension: Secondary | ICD-10-CM | POA: Diagnosis not present

## 2015-02-07 DIAGNOSIS — D472 Monoclonal gammopathy: Secondary | ICD-10-CM | POA: Diagnosis not present

## 2015-02-07 DIAGNOSIS — R6883 Chills (without fever): Secondary | ICD-10-CM | POA: Diagnosis not present

## 2015-02-07 DIAGNOSIS — Z114 Encounter for screening for human immunodeficiency virus [HIV]: Secondary | ICD-10-CM | POA: Diagnosis not present

## 2015-02-07 DIAGNOSIS — R509 Fever, unspecified: Secondary | ICD-10-CM | POA: Diagnosis not present

## 2015-02-14 ENCOUNTER — Telehealth: Payer: Self-pay | Admitting: Interventional Cardiology

## 2015-02-14 NOTE — Telephone Encounter (Signed)
Returned pt call. Pt sts that she has had increased sob and fatigue over the last several weeks. She rqst an appt with Dr.Smith. Pt aware work in appt scheduled on 5/10 @9 :30am Adv to call the office if symptoms worsen. Pt appreciative for the assistance and verbalized understanding.

## 2015-02-14 NOTE — Telephone Encounter (Signed)
Pt states she has SOB and fatigues easily for more than a month now. Normal speech until she has talked for a few minutes then is winded. Denies edema, chest pain, cough. Pt also states that her heart  is not out of rhythm because she can tell when she is. Pt reviewed BP/p as follows ; 122/72, p 84  136/79 p 97, 124/73 p 83 130/69 p 80. Pt declines seeing PCP but states she has not been seen for SOB.  I will forward to Dr and assist to advise.

## 2015-02-14 NOTE — Telephone Encounter (Signed)
Pt c/o Shortness Of Breath: STAT if SOB developed within the last 24 hours or pt is noticeably SOB on the phone  1. Are you currently SOB (can you hear that pt is SOB on the phone)? yes  2. How long have you been experiencing SOB? Several months  3. Are you SOB when sitting or when up moving around? Sitting down  4. Are you currently experiencing any other symptoms? no

## 2015-02-21 ENCOUNTER — Encounter: Payer: Self-pay | Admitting: Interventional Cardiology

## 2015-02-21 ENCOUNTER — Ambulatory Visit (INDEPENDENT_AMBULATORY_CARE_PROVIDER_SITE_OTHER): Payer: Medicare Other | Admitting: Interventional Cardiology

## 2015-02-21 VITALS — BP 138/80 | HR 73 | Ht 65.0 in | Wt 110.6 lb

## 2015-02-21 DIAGNOSIS — I10 Essential (primary) hypertension: Secondary | ICD-10-CM | POA: Diagnosis not present

## 2015-02-21 DIAGNOSIS — I4891 Unspecified atrial fibrillation: Secondary | ICD-10-CM

## 2015-02-21 DIAGNOSIS — R0600 Dyspnea, unspecified: Secondary | ICD-10-CM | POA: Diagnosis not present

## 2015-02-21 DIAGNOSIS — I480 Paroxysmal atrial fibrillation: Secondary | ICD-10-CM

## 2015-02-21 DIAGNOSIS — I495 Sick sinus syndrome: Secondary | ICD-10-CM

## 2015-02-21 LAB — BRAIN NATRIURETIC PEPTIDE: PRO B NATRI PEPTIDE: 156 pg/mL — AB (ref 0.0–100.0)

## 2015-02-21 NOTE — Patient Instructions (Signed)
Medication Instructions:  Your physician recommends that you continue on your current medications as directed. Please refer to the Current Medication list given to you today.   Labwork: Lab Today:[facility]  Testing/Procedures: None   Follow-Up: Your physician wants you to follow-up in: 1 year with Dr.Smith You will receive a reminder letter in the mail two months in advance. If you don't receive a letter, please call our office to schedule the follow-up appointment.   Any Other Special Instructions Will Be Listed Below (If Applicable).
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
## 2015-02-23 ENCOUNTER — Telehealth: Payer: Self-pay

## 2015-02-23 NOTE — Telephone Encounter (Signed)
Pt aware of lab results with verbal understanding. Normal and no evidence that heart is causing dyspnea.

## 2015-03-17 DIAGNOSIS — R351 Nocturia: Secondary | ICD-10-CM | POA: Diagnosis not present

## 2015-03-17 DIAGNOSIS — R312 Other microscopic hematuria: Secondary | ICD-10-CM | POA: Diagnosis not present

## 2015-03-17 DIAGNOSIS — R350 Frequency of micturition: Secondary | ICD-10-CM | POA: Diagnosis not present

## 2015-03-17 DIAGNOSIS — R3915 Urgency of urination: Secondary | ICD-10-CM | POA: Diagnosis not present

## 2015-04-06 ENCOUNTER — Other Ambulatory Visit: Payer: Self-pay | Admitting: Obstetrics and Gynecology

## 2015-04-06 DIAGNOSIS — N760 Acute vaginitis: Secondary | ICD-10-CM | POA: Diagnosis not present

## 2015-04-11 ENCOUNTER — Encounter: Payer: Self-pay | Admitting: Gastroenterology

## 2015-04-11 ENCOUNTER — Encounter: Payer: Self-pay | Admitting: Internal Medicine

## 2015-05-01 DIAGNOSIS — R350 Frequency of micturition: Secondary | ICD-10-CM | POA: Diagnosis not present

## 2015-05-01 DIAGNOSIS — N3941 Urge incontinence: Secondary | ICD-10-CM | POA: Diagnosis not present

## 2015-05-01 DIAGNOSIS — R312 Other microscopic hematuria: Secondary | ICD-10-CM | POA: Diagnosis not present

## 2015-06-15 DIAGNOSIS — Z961 Presence of intraocular lens: Secondary | ICD-10-CM | POA: Diagnosis not present

## 2015-06-15 DIAGNOSIS — H524 Presbyopia: Secondary | ICD-10-CM | POA: Diagnosis not present

## 2015-06-22 DIAGNOSIS — R8290 Unspecified abnormal findings in urine: Secondary | ICD-10-CM | POA: Diagnosis not present

## 2015-06-22 DIAGNOSIS — R636 Underweight: Secondary | ICD-10-CM | POA: Diagnosis not present

## 2015-06-22 DIAGNOSIS — M47816 Spondylosis without myelopathy or radiculopathy, lumbar region: Secondary | ICD-10-CM | POA: Diagnosis not present

## 2015-06-22 DIAGNOSIS — D472 Monoclonal gammopathy: Secondary | ICD-10-CM | POA: Diagnosis not present

## 2015-06-22 DIAGNOSIS — I10 Essential (primary) hypertension: Secondary | ICD-10-CM | POA: Diagnosis not present

## 2015-07-27 DIAGNOSIS — D472 Monoclonal gammopathy: Secondary | ICD-10-CM | POA: Diagnosis not present

## 2015-08-10 ENCOUNTER — Telehealth: Payer: Self-pay | Admitting: Internal Medicine

## 2015-08-10 NOTE — Telephone Encounter (Signed)
Patient reports RLQ pain, alternating diarrhea.  She reports that she also has chills and back pain, she can't be more specific with her symptoms.  All symptoms are consistent with symptoms at her last office visit.  She is offered an appt on 08/18/15, she declines.  Only wants to see Dr. Carlean Purl .  She is scheduled to see Dr. Carlean Purl on 09/18/15

## 2015-08-18 DIAGNOSIS — M4306 Spondylolysis, lumbar region: Secondary | ICD-10-CM | POA: Diagnosis not present

## 2015-08-18 DIAGNOSIS — M25551 Pain in right hip: Secondary | ICD-10-CM | POA: Diagnosis not present

## 2015-08-18 DIAGNOSIS — M5416 Radiculopathy, lumbar region: Secondary | ICD-10-CM | POA: Diagnosis not present

## 2015-08-18 DIAGNOSIS — M25552 Pain in left hip: Secondary | ICD-10-CM | POA: Diagnosis not present

## 2015-08-25 DIAGNOSIS — M545 Low back pain: Secondary | ICD-10-CM | POA: Diagnosis not present

## 2015-08-25 DIAGNOSIS — M542 Cervicalgia: Secondary | ICD-10-CM | POA: Diagnosis not present

## 2015-08-29 DIAGNOSIS — M47817 Spondylosis without myelopathy or radiculopathy, lumbosacral region: Secondary | ICD-10-CM | POA: Diagnosis not present

## 2015-08-29 DIAGNOSIS — M545 Low back pain: Secondary | ICD-10-CM | POA: Diagnosis not present

## 2015-09-01 DIAGNOSIS — M47817 Spondylosis without myelopathy or radiculopathy, lumbosacral region: Secondary | ICD-10-CM | POA: Diagnosis not present

## 2015-09-04 DIAGNOSIS — K838 Other specified diseases of biliary tract: Secondary | ICD-10-CM | POA: Diagnosis not present

## 2015-09-12 DIAGNOSIS — M545 Low back pain: Secondary | ICD-10-CM | POA: Diagnosis not present

## 2015-09-12 DIAGNOSIS — M47817 Spondylosis without myelopathy or radiculopathy, lumbosacral region: Secondary | ICD-10-CM | POA: Diagnosis not present

## 2015-09-18 ENCOUNTER — Telehealth: Payer: Self-pay | Admitting: Internal Medicine

## 2015-09-18 ENCOUNTER — Ambulatory Visit: Payer: Medicare Other | Admitting: Internal Medicine

## 2015-09-18 NOTE — Telephone Encounter (Signed)
No charge. 

## 2015-09-21 DIAGNOSIS — Z23 Encounter for immunization: Secondary | ICD-10-CM | POA: Diagnosis not present

## 2015-10-31 DIAGNOSIS — M47817 Spondylosis without myelopathy or radiculopathy, lumbosacral region: Secondary | ICD-10-CM | POA: Diagnosis not present

## 2015-10-31 DIAGNOSIS — M25541 Pain in joints of right hand: Secondary | ICD-10-CM | POA: Diagnosis not present

## 2015-10-31 DIAGNOSIS — M545 Low back pain: Secondary | ICD-10-CM | POA: Diagnosis not present

## 2015-11-06 ENCOUNTER — Ambulatory Visit: Payer: Medicare Other | Admitting: Neurology

## 2015-11-14 ENCOUNTER — Encounter: Payer: Self-pay | Admitting: Internal Medicine

## 2015-11-14 ENCOUNTER — Ambulatory Visit (INDEPENDENT_AMBULATORY_CARE_PROVIDER_SITE_OTHER): Payer: Medicare Other | Admitting: Internal Medicine

## 2015-11-14 VITALS — BP 132/70 | HR 76 | Temp 97.7°F | Ht 64.5 in | Wt 113.2 lb

## 2015-11-14 DIAGNOSIS — G8929 Other chronic pain: Secondary | ICD-10-CM | POA: Diagnosis not present

## 2015-11-14 DIAGNOSIS — R1031 Right lower quadrant pain: Secondary | ICD-10-CM

## 2015-11-14 DIAGNOSIS — D472 Monoclonal gammopathy: Secondary | ICD-10-CM

## 2015-11-14 DIAGNOSIS — K589 Irritable bowel syndrome without diarrhea: Secondary | ICD-10-CM

## 2015-11-14 NOTE — Progress Notes (Signed)
   Subjective:    Patient ID: Macaylah Bater, female    DOB: 08-07-1927, 82 y.o.   MRN: [id number] Chief complaint: Right-sided abdominal pain HPI The patient is here with her daughter. She continues to have episodic right-sided abdominal pain in the right lower right mid quadrant area. She gets shaking chills and fever with a low-grade temperature elevation to 100. This has happened for years ever since her cholecystectomy. Numerous GI evaluations and imaging studies have not revealed a cause. She is either failed or had side effects or been noncompliant with multiple agents to try to treat this. More recently she has been seen at [facility] for a monoclonal gammopathy of unknown significance. It's an IgA. She is to have follow-up care in the spring. She also saw [facility] last year without any new conclusions. Medications, allergies, past medical history, past surgical history, family history and social history are reviewed and updated in the EMR.  Review of Systems As above    Objective:   Physical Exam BP 132/70 mmHg  Pulse 76  Temp(Src) 97.7 F (36.5 C) (Oral)  Ht 5' 4.5" (1.638 m)  Wt 113 lb 3.2 oz (51.347 kg)  BMI 19.14 kg/m2 NAD abd is thin soft an nontender     Assessment & Plan:   1. Chronic right lower quadrant pain   2. IBS (irritable bowel syndrome)   3. MGUS (monoclonal gammopathy of unknown significance)    I explained to her that I don't have any new recommendations regarding this chronic recurrent problem. She asked if a sponge or something could've been left inside of her cholecystectomy I suppose that is possible but she's never had anything like an abscess show up. Sotalol as possible I doubt it. My diagnosis is IBS. I don't know that she ever really has a true fever. Unfortunately I have no different therapeutic options and an do not think the diagnostic testing that I know of and have available is appropriate. I don't think her monoclonal gammopathy of unknown  significance is anything to do with this but that certainly should be followed. She will see me as needed. No she is frustrated by the chronicity of the symptoms and now she gets them weekly and that using narcotic pain medicine is really think she can do but at least she has a regimen at this point.  CC: Irven Shelling, MD

## 2015-11-14 NOTE — Assessment & Plan Note (Signed)
Persists I have no new recommendations or directions for investigation. I must assume it is a functional process - IBS related.

## 2015-11-14 NOTE — Patient Instructions (Signed)
   Please follow up with [place] as needed.     I appreciate the opportunity to care for you. Silvano Rusk, MD, [facility]

## 2015-11-21 DIAGNOSIS — M19041 Primary osteoarthritis, right hand: Secondary | ICD-10-CM | POA: Diagnosis not present

## 2015-11-28 ENCOUNTER — Ambulatory Visit: Payer: Medicare Other | Admitting: Neurology

## 2015-12-04 ENCOUNTER — Encounter: Payer: Self-pay | Admitting: Neurology

## 2015-12-04 ENCOUNTER — Ambulatory Visit (INDEPENDENT_AMBULATORY_CARE_PROVIDER_SITE_OTHER): Payer: Medicare Other | Admitting: Neurology

## 2015-12-04 VITALS — BP 123/66 | HR 75 | Ht 65.0 in | Wt 112.0 lb

## 2015-12-04 DIAGNOSIS — E531 Pyridoxine deficiency: Secondary | ICD-10-CM | POA: Diagnosis not present

## 2015-12-04 DIAGNOSIS — W19XXXA Unspecified fall, initial encounter: Secondary | ICD-10-CM | POA: Diagnosis not present

## 2015-12-04 DIAGNOSIS — E538 Deficiency of other specified B group vitamins: Secondary | ICD-10-CM | POA: Diagnosis not present

## 2015-12-04 DIAGNOSIS — R270 Ataxia, unspecified: Secondary | ICD-10-CM | POA: Diagnosis not present

## 2015-12-04 DIAGNOSIS — G629 Polyneuropathy, unspecified: Secondary | ICD-10-CM | POA: Diagnosis not present

## 2015-12-04 DIAGNOSIS — E519 Thiamine deficiency, unspecified: Secondary | ICD-10-CM | POA: Diagnosis not present

## 2015-12-04 DIAGNOSIS — Z79899 Other long term (current) drug therapy: Secondary | ICD-10-CM | POA: Diagnosis not present
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
## 2015-12-04 NOTE — Patient Instructions (Signed)
Remember to drink plenty of fluid, eat healthy meals and do not skip any meals. Try to eat protein with a every meal and eat a healthy snack such as fruit or nuts in between meals. Try to keep a regular sleep-wake schedule and try to exercise daily, particularly in the form of walking, 20-30 minutes a day, if you can.   As far as diagnostic testing: MRI of the brain and cervical spine, labs  I would like to see you back in 3 months, sooner if we need to. Please call us with any interim questions, concerns, problems, updates or refill requests.   Our phone number is [phone]. We also have an after hours call service for urgent matters and there is a physician on-call for urgent questions. For any emergencies you know to call 911 or go to the nearest emergency room

## 2015-12-06 ENCOUNTER — Telehealth: Payer: Self-pay | Admitting: *Deleted

## 2015-12-06 NOTE — Telephone Encounter (Signed)
Spoke to pt about normal labs per Dr Jaynee Eagles. Pt verbalized understanding.

## 2015-12-06 NOTE — Telephone Encounter (Signed)
-----   Message from Melvenia Beam, MD sent at 12/06/2015  7:57 AM EST ----- Labs normal thanks

## 2015-12-09 LAB — HEAVY METALS, BLOOD
Arsenic: 6 ug/L (ref 2–23)
LEAD, BLOOD: 3 ug/dL (ref 0–19)
MERCURY: NOT DETECTED ug/L (ref 0.0–14.9)

## 2015-12-09 LAB — B12 AND FOLATE PANEL: Vitamin B-12: 597 pg/mL (ref 211–946)

## 2015-12-09 LAB — VITAMIN B6: VITAMIN B6: 29.7 ug/L (ref 2.0–32.8)

## 2015-12-09 LAB — B. BURGDORFI ANTIBODIES: Lyme IgG/IgM Ab: <0.91 {ISR} (ref 0.00–0.90)

## 2015-12-09 LAB — TSH: TSH: 4.24 u[IU]/mL (ref 0.450–4.500)

## 2015-12-09 LAB — VITAMIN B1: Thiamine: 152.9 nmol/L (ref 66.5–200.0)

## 2015-12-09 LAB — METHYLMALONIC ACID, SERUM: METHYLMALONIC ACID: 197 nmol/L (ref 0–378)

## 2015-12-15 ENCOUNTER — Ambulatory Visit
Admission: RE | Admit: 2015-12-15 | Discharge: 2015-12-15 | Disposition: A | Discharge status: Home / Self Care | Payer: Medicare Other | Source: Ambulatory Visit | Attending: Neurology | Admitting: Neurology

## 2015-12-15 DIAGNOSIS — M4802 Spinal stenosis, cervical region: Secondary | ICD-10-CM | POA: Diagnosis not present

## 2015-12-15 DIAGNOSIS — W19XXXA Unspecified fall, initial encounter: Secondary | ICD-10-CM

## 2015-12-15 DIAGNOSIS — R270 Ataxia, unspecified: Secondary | ICD-10-CM | POA: Diagnosis not present

## 2015-12-19 ENCOUNTER — Telehealth: Payer: Self-pay | Admitting: *Deleted

## 2015-12-19 NOTE — Telephone Encounter (Signed)
-----   Message from Melvenia Beam, MD sent at 12/18/2015  5:48 PM EST ----- MRI of the brain normal for age. The MRI of the cervical spine showed a lot of arthritic changes but the cervical is normal and so there is no cause found in the brain or in the neck to explain her symptoms, no lesions, no masses, no stroke or other problems in the brain or cervical cord to explain her neuropathy or balance problems which is good news. thanks

## 2015-12-19 NOTE — Telephone Encounter (Signed)
Called and spoke to pt about MRI brain/cervical cord per Dr Jaynee Eagles note. Pt verbalized understanding.

## 2015-12-22 DIAGNOSIS — R509 Fever, unspecified: Secondary | ICD-10-CM | POA: Diagnosis not present

## 2015-12-22 DIAGNOSIS — M8588 Other specified disorders of bone density and structure, other site: Secondary | ICD-10-CM | POA: Diagnosis not present

## 2015-12-22 DIAGNOSIS — Z1389 Encounter for screening for other disorder: Secondary | ICD-10-CM | POA: Diagnosis not present

## 2015-12-22 DIAGNOSIS — M5417 Radiculopathy, lumbosacral region: Secondary | ICD-10-CM | POA: Diagnosis not present

## 2015-12-22 DIAGNOSIS — K838 Other specified diseases of biliary tract: Secondary | ICD-10-CM | POA: Diagnosis not present

## 2015-12-22 DIAGNOSIS — M4727 Other spondylosis with radiculopathy, lumbosacral region: Secondary | ICD-10-CM | POA: Diagnosis not present

## 2015-12-22 DIAGNOSIS — Z0000 Encounter for general adult medical examination without abnormal findings: Secondary | ICD-10-CM | POA: Diagnosis not present

## 2016-01-11 DIAGNOSIS — R6883 Chills (without fever): Secondary | ICD-10-CM | POA: Diagnosis not present

## 2016-02-01 ENCOUNTER — Telehealth: Payer: Self-pay | Admitting: Interventional Cardiology

## 2016-02-01 NOTE — Telephone Encounter (Signed)
Returned pt call. Pt sts that he sob is stable. She gets sob when she "stirs" around She thinks she may be having increase episodes of Afib. Pt denies chest pain, swelling, nausea, palpitations. Offered pt a appt with an APP, she refused on wants to see Dr.Smith only. Adv her that I could try to find her a work in appt but it will not be this month. Pt sts that she feels her symptoms are stable and wants to wait to see Dr.Smith. appt scheduled with Dr.Smith for 5/8 @3 :15pm. Adv pt to call back if her symptoms worsen. Pt agreeable with plan and verbalized understanding

## 2016-02-01 NOTE — Telephone Encounter (Signed)
New message  Pt c/o Shortness Of Breath: STAT if SOB developed within the last 24 hours or pt is noticeably SOB on the phone  1. Are you currently SOB (can you hear that pt is SOB on the phone)? Yes. She just about keeps it here lately   2. How long have you been experiencing SOB? Its gotten worse within the last 3 weeks but she has always had it. Some days she is ok but it has gotten a lot worse   3. Are you SOB when sitting or when up moving around? Mostly when she is doing her daily routines   4.  Are you currently experiencing any other symptoms? Afib  .Patient c/o Afib:  High priority if patient c/o lightheadedness and shortness of breath.  1. How long have you been having Afib? Noticed it more frequently since the beginning of the year.   2. Are you currently experiencing lightheadedness and shortness of breath? no 3. Have you checked your BP and heart rate? (document readings) No readings

## 2016-02-06 DIAGNOSIS — L820 Inflamed seborrheic keratosis: Secondary | ICD-10-CM | POA: Diagnosis not present

## 2016-02-06 DIAGNOSIS — D225 Melanocytic nevi of trunk: Secondary | ICD-10-CM | POA: Diagnosis not present

## 2016-02-19 ENCOUNTER — Ambulatory Visit (INDEPENDENT_AMBULATORY_CARE_PROVIDER_SITE_OTHER): Payer: Medicare Other | Admitting: Interventional Cardiology

## 2016-02-19 ENCOUNTER — Encounter: Payer: Self-pay | Admitting: Interventional Cardiology

## 2016-02-19 VITALS — BP 126/70 | HR 77 | Ht 68.0 in | Wt 112.2 lb

## 2016-02-19 DIAGNOSIS — I480 Paroxysmal atrial fibrillation: Secondary | ICD-10-CM | POA: Diagnosis not present

## 2016-02-19 DIAGNOSIS — I10 Essential (primary) hypertension: Secondary | ICD-10-CM | POA: Diagnosis not present

## 2016-02-19 DIAGNOSIS — I495 Sick sinus syndrome: Secondary | ICD-10-CM | POA: Diagnosis not present

## 2016-02-19 NOTE — Patient Instructions (Signed)
Medication Instructions:  Your physician recommends that you continue on your current medications as directed. Please refer to the Current Medication list given to you today.  Labwork: None ordered.  Testing/Procedures: None ordered.  Follow-Up: Your physician recommends that you schedule a follow-up appointment as needed.   Any Other Special Instructions Will Be Listed Below (If Applicable).     If you need a refill on your cardiac medications before your next appointment, please call your pharmacy.   
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
## 2016-03-04 ENCOUNTER — Ambulatory Visit: Payer: Medicare Other | Admitting: Neurology

## 2016-03-14 DIAGNOSIS — R3915 Urgency of urination: Secondary | ICD-10-CM | POA: Diagnosis not present

## 2016-03-14 DIAGNOSIS — R351 Nocturia: Secondary | ICD-10-CM | POA: Diagnosis not present

## 2016-03-18 DIAGNOSIS — M545 Low back pain: Secondary | ICD-10-CM | POA: Diagnosis not present

## 2016-03-18 DIAGNOSIS — M47817 Spondylosis without myelopathy or radiculopathy, lumbosacral region: Secondary | ICD-10-CM | POA: Diagnosis not present

## 2016-03-18 DIAGNOSIS — M19041 Primary osteoarthritis, right hand: Secondary | ICD-10-CM | POA: Diagnosis not present

## 2016-03-28 DIAGNOSIS — M545 Low back pain: Secondary | ICD-10-CM | POA: Diagnosis not present

## 2016-03-28 DIAGNOSIS — M47817 Spondylosis without myelopathy or radiculopathy, lumbosacral region: Secondary | ICD-10-CM | POA: Diagnosis not present

## 2016-04-11 DIAGNOSIS — M545 Low back pain: Secondary | ICD-10-CM | POA: Diagnosis not present

## 2016-04-11 DIAGNOSIS — M47817 Spondylosis without myelopathy or radiculopathy, lumbosacral region: Secondary | ICD-10-CM | POA: Diagnosis not present

## 2016-05-14 ENCOUNTER — Other Ambulatory Visit: Payer: Self-pay | Admitting: Nurse Practitioner

## 2016-05-14 ENCOUNTER — Ambulatory Visit
Admission: RE | Admit: 2016-05-14 | Discharge: 2016-05-14 | Disposition: A | Discharge status: Home / Self Care | Payer: Medicare Other | Source: Ambulatory Visit | Attending: Nurse Practitioner | Admitting: Nurse Practitioner

## 2016-05-14 DIAGNOSIS — R0602 Shortness of breath: Secondary | ICD-10-CM | POA: Diagnosis not present

## 2016-05-14 DIAGNOSIS — R05 Cough: Secondary | ICD-10-CM | POA: Diagnosis not present

## 2016-05-14 DIAGNOSIS — J069 Acute upper respiratory infection, unspecified: Secondary | ICD-10-CM | POA: Diagnosis not present

## 2016-05-14 DIAGNOSIS — R0982 Postnasal drip: Secondary | ICD-10-CM | POA: Diagnosis not present

## 2016-05-29 DIAGNOSIS — J209 Acute bronchitis, unspecified: Secondary | ICD-10-CM | POA: Diagnosis not present

## 2016-06-25 DIAGNOSIS — R509 Fever, unspecified: Secondary | ICD-10-CM | POA: Diagnosis not present

## 2016-06-25 DIAGNOSIS — D472 Monoclonal gammopathy: Secondary | ICD-10-CM | POA: Diagnosis not present

## 2016-06-25 DIAGNOSIS — M5417 Radiculopathy, lumbosacral region: Secondary | ICD-10-CM | POA: Diagnosis not present

## 2016-06-25 DIAGNOSIS — J029 Acute pharyngitis, unspecified: Secondary | ICD-10-CM | POA: Diagnosis not present

## 2016-07-01 ENCOUNTER — Ambulatory Visit (INDEPENDENT_AMBULATORY_CARE_PROVIDER_SITE_OTHER): Payer: Medicare Other | Admitting: Physician Assistant

## 2016-07-01 ENCOUNTER — Encounter (INDEPENDENT_AMBULATORY_CARE_PROVIDER_SITE_OTHER): Payer: Self-pay

## 2016-07-01 ENCOUNTER — Encounter: Payer: Self-pay | Admitting: Physician Assistant

## 2016-07-01 ENCOUNTER — Other Ambulatory Visit (INDEPENDENT_AMBULATORY_CARE_PROVIDER_SITE_OTHER): Payer: Medicare Other

## 2016-07-01 VITALS — BP 130/70 | HR 80 | Ht 64.5 in | Wt 111.0 lb

## 2016-07-01 DIAGNOSIS — R1013 Epigastric pain: Secondary | ICD-10-CM

## 2016-07-01 DIAGNOSIS — R1032 Left lower quadrant pain: Secondary | ICD-10-CM | POA: Diagnosis not present

## 2016-07-01 DIAGNOSIS — R350 Frequency of micturition: Secondary | ICD-10-CM | POA: Diagnosis not present

## 2016-07-01 DIAGNOSIS — R509 Fever, unspecified: Secondary | ICD-10-CM | POA: Diagnosis not present

## 2016-07-01 DIAGNOSIS — N3941 Urge incontinence: Secondary | ICD-10-CM | POA: Diagnosis not present

## 2016-07-01 DIAGNOSIS — G8929 Other chronic pain: Secondary | ICD-10-CM

## 2016-07-01 DIAGNOSIS — R1031 Right lower quadrant pain: Secondary | ICD-10-CM

## 2016-07-01 DIAGNOSIS — R6883 Chills (without fever): Secondary | ICD-10-CM

## 2016-07-01 LAB — C-REACTIVE PROTEIN: CRP: 0.4 mg/dL — AB (ref 0.5–20.0)

## 2016-07-01 LAB — SEDIMENTATION RATE: Sed Rate: 5 mm/hr (ref 0–30)

## 2016-07-01 MED ORDER — DICYCLOMINE HCL 10 MG PO CAPS: 10.0000 mg | ORAL_CAPSULE | Freq: Three times a day (TID) | ORAL | 3 refills | Status: DC

## 2016-07-01 NOTE — Patient Instructions (Addendum)
Please go to the basement level to have your labs drawn.  We sent a prescription to [facility]. 1. Bentyl ( dicyclomine) 10 mg.   You have been scheduled for a CT scan of the abdomen and pelvis at [facility] ([address]---this is in the same building as [facility]).   You are scheduled on Friday 07-05-2016 at 11:30 am. You should arrive at 11:15 to your appointment time for registration. Please follow the written instructions below on the day of your exam:  WARNING: IF YOU ARE ALLERGIC TO IODINE/X-RAY DYE, PLEASE NOTIFY RADIOLOGY IMMEDIATELY AT [phone]! YOU WILL BE GIVEN A 13 HOUR PREMEDICATION PREP.  1) Do not eat  anything after 7:30 am (4 hours prior to your test) 2) You have been given 2 bottles of oral contrast to drink. The solution may taste               better if refrigerated, but do NOT add ice or any other liquid to this solution. Shake             well before drinking.    Drink 1 bottle of contrast @ 9:30 am (2 hours prior to your exam)  Drink 1 bottle of contrast @ 10:30 am (1 hour prior to your exam)  You may take any medications as prescribed with a small amount of water except for the following: Metformin, Glucophage, Glucovance, Avandamet, Riomet, Fortamet, Actoplus Met, Janumet, Glumetza or Metaglip. The above medications must be held the day of the exam AND 48 hours after the exam.  The purpose of you drinking the oral contrast is to aid in the visualization of your intestinal tract. The contrast solution may cause some diarrhea. Before your exam is started, you will be given a small amount of fluid to drink. Depending on your individual set of symptoms, you may also receive an intravenous injection of x-ray contrast/dye. Plan on being at [facility] for 30 minutes or long, depending on the type of exam you are having performed.  If you have any questions regarding your exam or if you need to reschedule, you may call the CT  department at [phone] between the hours of 8:00 am and 5:00 pm, Monday-Friday.  ________________________________________________________________________

## 2016-07-01 NOTE — Progress Notes (Signed)
Subjective:    Patient ID: Kaitlin [name], female    DOB: 07-Oct-1927, 82 y.o.   MRN: [id number]  HPI Kaitlin [name] is a pleasant 83 year old white female known to Dr. Carlean Purl, primary M.D. is Dr. [name]. She has history of atrial fibrillation, tachybradycardia syndrome, hypertension coronary artery disease status post MI and has a monoclonal gammopathy. She is status post cholecystectomy. Surgery was done 15 or 16 years ago, fairly initially laparoscopically and then complicated by a postoperative bleed which required emergent reoperation with exploratory lap. Per Dr. Gershon Crane has had chronic complaints of right-sided abdominal pain somewhat episodically over the past several years. She comes in today with similar complaints. She has been seeing an orthopedist for back problems and is to undergo and ablation type procedure. It was suggested by the orthopedist that she get a second GI opinion. She states that her records were sent to Dr. Cari Caraway at [facility] he said that he did not have anything to offer her. She was not actually seen there. Kaitlin [name] She comes back here today stating that she's been having an increase in frequency of her episodes of abdominal pain which she says can sometimes be in the upper abdomen and sometimes in the lower abdomen. She's having weekly episodes with with what she describes as low-grade fevers in the 99-100 range and chills. This may last for a day or 2 and then subsides she feels that she has an increase in abdominal discomfort during this time does not usually have any nausea or vomiting and no change in her bowel habits. She also has ongoing abdominal discomfort primarily on her right side and says that she has a not that comes up at times in her right lower quadrant but is not present currently. She says she's had extensive workup in the past for these episodes of fever and chills including prior infectious disease evaluation and there has not been any cause  found.  Review of Systems Pertinent positive and negative review of systems were noted in the above HPI section.  All other review of systems was otherwise negative.  Outpatient Encounter Prescriptions as of 07/01/2016  Medication Sig  . ALPRAZolam (XANAX) 0.25 MG tablet Take 0.125 mg by mouth 3 (three) times daily as needed for anxiety. For sleep  . B Complex-C (SUPER B COMPLEX) TABS Take 1 tablet by mouth daily.    [name] HYDROcodone-acetaminophen (NORCO/VICODIN) 5-325 MG per tablet Take 1 tablet by mouth every 6 (six) hours as needed for moderate pain.  . nitroGLYCERIN (NITROSTAT) 0.4 MG SL tablet Place 1 tablet (0.4 mg total) under the tongue every 5 (five) minutes x 3 doses as needed for chest pain.  . Omega-3 Fatty Acids (FISH OIL) 1000 MG CAPS Take 1,000 mg by mouth daily.   . polyethylene glycol (MIRALAX / GLYCOLAX) packet Take 17 g by mouth daily as needed for mild constipation.   . Probiotic Product (PROBIOTIC DAILY PO) Take 1 capsule by mouth daily.  [name] dicyclomine (BENTYL) 10 MG capsule Take 1 capsule (10 mg total) by mouth 3 (three) times daily before meals.  . [DISCONTINUED] amoxicillin (AMOXIL) 500 MG capsule Take four (4) capsules by mouth as needed one (1) hour prior to dental procedures.  . [DISCONTINUED] diclofenac sodium (VOLTAREN) 1 % GEL APPLY TO AFFECTED AREA TWICE A DAY AS NEEDED   No facility-administered encounter medications on file as of 07/01/2016.    Allergies  Allergen Reactions  . Dilaudid [Hydromorphone Hcl] Other (See Comments)  Dropped heart rate really low  . Codeine Nausea And Vomiting and Rash  . Morphine And Related Nausea And Vomiting and Rash  . Nizatidine Other (See Comments)    Unknown reaction    Patient Active Problem List   Diagnosis Date Noted  . Esophageal dysmotility 10/18/2014  . GERD (gastroesophageal reflux disease)   . Osteoarthritis   . Mitral valve prolapse   . Tachy-brady syndrome ([place])   . Chest pain   . NSTEMI (non-ST elevated  myocardial infarction) ([place]) 01/25/2014  . Essential hypertension, benign 08/11/2013  . Sinus node dysfunction/post termination pauses   . Chronic right lower quadrant pain 11/12/2011  . COLONIC POLYPS, HX OF 04/12/2010  . ANXIETY DEPRESSION 02/07/2009  . PERIPHERAL NEUROPATHY 02/07/2009  . Mitral valve disorder 02/07/2009  . Atrial fibrillation ([place]) 02/07/2009  . OSTEOARTHRITIS 02/07/2009  . BACK PAIN 02/07/2009  . INSOMNIA 02/07/2009  . GERD 06/23/2008  . Irritable bowel syndrome 06/23/2008  . DIVERTICULOSIS OF COLON 12/07/2007   Social History   Social History  . Marital status: Widowed    Spouse name: N/A  . Number of children: 6  . Years of education: N/A   Occupational History  . Retired     Social History Main Topics  . Smoking status: Never Smoker  . Smokeless tobacco: Never Used  . Alcohol use No  . Drug use: No  . Sexual activity: Not on file   Other Topics Concern  . Not on file   Social History Narrative  . No narrative on file    Kaitlin [name]'s family history includes Arthritis in her brother; Diabetes in her father; Healthy in her brother, brother, and sister; Heart disease in her mother and sister; Other in her brother; Stroke in her brother; Thyroid cancer in her sister.      Objective:    Vitals:   07/01/16 0821  BP: 130/70  Pulse: 80    Physical Exam  well-developed thin elderly white female in no acute distress, pleasant accompanied by her daughter blood pressure 130/70 pulse 80 height 5 foot 4 weight 111 BMI 18.7. HEENT; nontraumatic normocephalic EOMI PERRLA sclera anicteric, Cardiovascular; regular rate and rhythm with S1-S2 no murmur or gallop, Pulmonary; clear bilaterally, Abdomen ;soft there is no focal tenderness no palpable mass or hepatosplenomegaly she does have abdominal incisional scars, sounds present, Rectal ;exam not done, Extremities ;no clubbing cyanosis or edema skin warm and dry, Neuropsych; mood and affect appropriate        Assessment & Plan:   #17 82 year old white female with chronic complaints of abdominal pain, primarily right-sided and episodes of low-grade fevers and chills which she has had for years but seems to be increasing in frequency and associated with some mild increase in her chronic abdominal discomfort. Etiology is not clear. She is status post cholecystectomy remotely which was complicated as outlined above Last CT 2013 showed stable mild prominence of the intrahepatics and common bile duct she also has numerous left colon diverticuli Labs done less than a week ago by her PCP unremarkable including LFTs  #2 monoclonal gammopathy #3 coronary artery disease status post MI #4 history of tachybradycardia syndrome  Plan; Will try Bentyl 10 mg by mouth 3 times a day before meals Check sedimentation rate and CRP Schedule for CT of the abdomen and pelvis with contrast. Further plans pending results of above  Alfredia Ferguson PA-C 07/01/2016   Cc: [name], MD

## 2016-07-03 NOTE — Progress Notes (Signed)
She has had chronic pain as described in this note - has never repsonded to Abx, neuropathic Tx or other. It has been a while since she has had a CT - will see what it shows.  Gatha Mayer, MD, [facility]

## 2016-07-05 ENCOUNTER — Ambulatory Visit (INDEPENDENT_AMBULATORY_CARE_PROVIDER_SITE_OTHER)
Admission: RE | Admit: 2016-07-05 | Discharge: 2016-07-05 | Disposition: A | Discharge status: Home / Self Care | Payer: Medicare Other | Source: Ambulatory Visit | Attending: Physician Assistant | Admitting: Physician Assistant

## 2016-07-05 DIAGNOSIS — R1031 Right lower quadrant pain: Secondary | ICD-10-CM | POA: Diagnosis not present

## 2016-07-05 DIAGNOSIS — R1032 Left lower quadrant pain: Secondary | ICD-10-CM | POA: Diagnosis not present

## 2016-07-05 DIAGNOSIS — K5790 Diverticulosis of intestine, part unspecified, without perforation or abscess without bleeding: Secondary | ICD-10-CM | POA: Diagnosis not present

## 2016-07-05 DIAGNOSIS — R1013 Epigastric pain: Secondary | ICD-10-CM | POA: Diagnosis not present

## 2016-07-05 DIAGNOSIS — R6883 Chills (without fever): Secondary | ICD-10-CM

## 2016-07-05 DIAGNOSIS — R509 Fever, unspecified: Secondary | ICD-10-CM

## 2016-07-05 DIAGNOSIS — G8929 Other chronic pain: Secondary | ICD-10-CM

## 2016-07-05 MED ORDER — IOPAMIDOL (ISOVUE-300) INJECTION 61%
100.0000 mL | Freq: Once | INTRAVENOUS | Status: AC | PRN
  Administered 2016-07-05: 100 mL via INTRAVENOUS

## 2016-07-10 ENCOUNTER — Telehealth: Payer: Self-pay | Admitting: Physician Assistant

## 2016-07-11 NOTE — Telephone Encounter (Signed)
Patient requesting CT results. Best [phone]

## 2016-07-11 NOTE — Telephone Encounter (Signed)
Spoke with Kaitlin [name]. She is is aware of no cancer, tumors or diverticulitis. She requests a copy of the report which will be mailed to her today. Awaiting provider interpretation.

## 2016-07-15 DIAGNOSIS — J310 Chronic rhinitis: Secondary | ICD-10-CM | POA: Diagnosis not present

## 2016-07-15 DIAGNOSIS — R1313 Dysphagia, pharyngeal phase: Secondary | ICD-10-CM | POA: Diagnosis not present

## 2016-07-16 ENCOUNTER — Telehealth: Payer: Self-pay | Admitting: Physician Assistant

## 2016-07-16 NOTE — Telephone Encounter (Signed)
Kaitlin [name].. Please review the Ct scan ..she has had diffuse pancreatic duct dilation , and has had some progression of intra/extra hepatic ductal dilation.. Think she has chronic pancreatitis of some sort accounting for her pain?..MRCP.

## 2016-07-16 NOTE — Telephone Encounter (Signed)
Pts daughter calling requesting results from CT scan that was done 07/05/16. State pt is still having fever and  chills off and on, can't eat much and when she does she has diarrhea. Still having pain in her abdomen and her back. Please advise.

## 2016-07-17 ENCOUNTER — Other Ambulatory Visit: Payer: Self-pay

## 2016-07-17 DIAGNOSIS — K8689 Other specified diseases of pancreas: Secondary | ICD-10-CM

## 2016-07-17 DIAGNOSIS — R509 Fever, unspecified: Secondary | ICD-10-CM

## 2016-07-17 DIAGNOSIS — K838 Other specified diseases of biliary tract: Secondary | ICD-10-CM

## 2016-07-17 NOTE — Telephone Encounter (Signed)
Please call pt/daughter let them know I went over Ct again with  Carlean Purl and she does have some dilation of main pancreatic duct and bile duct so  We want her to be scheduled for MRI/MRCP to further evaluate - please schedule

## 2016-07-17 NOTE — Telephone Encounter (Signed)
MRCP reasonable - had one at [facility] when she saw surgeon in past   Could have an ampullary lesion or something but normal LFT's go against this  Her chronic pain has never seemed like pancreatic pain to me I think her dilated PD may be age-related but since there is progressive biliary dilation MRCP reasonable

## 2016-07-18 NOTE — Telephone Encounter (Signed)
Pt scheduled for MRCP at [facility] 07/24/16. Pt to arrive there at 8:45am for a 9am appt. Pt and pts daughter aware of appt time.

## 2016-07-24 ENCOUNTER — Other Ambulatory Visit: Payer: Self-pay | Admitting: Physician Assistant

## 2016-07-24 ENCOUNTER — Ambulatory Visit (HOSPITAL_COMMUNITY)
Admission: RE | Admit: 2016-07-24 | Discharge: 2016-07-24 | Disposition: A | Discharge status: Home / Self Care | Payer: Medicare Other | Source: Ambulatory Visit | Attending: Physician Assistant | Admitting: Physician Assistant

## 2016-07-24 DIAGNOSIS — R935 Abnormal findings on diagnostic imaging of other abdominal regions, including retroperitoneum: Secondary | ICD-10-CM | POA: Diagnosis not present

## 2016-07-24 DIAGNOSIS — I700 Atherosclerosis of aorta: Secondary | ICD-10-CM | POA: Insufficient documentation

## 2016-07-24 DIAGNOSIS — R509 Fever, unspecified: Secondary | ICD-10-CM | POA: Diagnosis not present

## 2016-07-24 DIAGNOSIS — K838 Other specified diseases of biliary tract: Secondary | ICD-10-CM | POA: Diagnosis not present

## 2016-07-24 DIAGNOSIS — K8689 Other specified diseases of pancreas: Secondary | ICD-10-CM | POA: Insufficient documentation

## 2016-07-24 DIAGNOSIS — Z9049 Acquired absence of other specified parts of digestive tract: Secondary | ICD-10-CM | POA: Insufficient documentation

## 2016-07-24 DIAGNOSIS — R109 Unspecified abdominal pain: Secondary | ICD-10-CM | POA: Diagnosis not present

## 2016-07-24 LAB — CREATININE, SERUM
Creatinine, Ser: 0.76 mg/dL (ref 0.44–1.00)
GFR calc non Af Amer: >60 mL/min (ref >60)

## 2016-07-24 MED ORDER — GADOBENATE DIMEGLUMINE 529 MG/ML IV SOLN
10.0000 mL | Freq: Once | INTRAVENOUS | Status: AC
  Administered 2016-07-24: 10 mL via INTRAVENOUS

## 2016-07-25 NOTE — Progress Notes (Signed)
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 She could go for another opinion elsewhere if desired - she has been to [facility] before though  Please cc PCP

## 2016-07-26 ENCOUNTER — Telehealth: Payer: Self-pay | Admitting: Physician Assistant

## 2016-07-26 NOTE — Telephone Encounter (Signed)
Calling back in regards to her MRI of the abdomen. She asks where should she go? No answers at [facility]. She understands that you do not have anything to add. But she does not know where to go.

## 2016-07-26 NOTE — Telephone Encounter (Signed)
[facility]

## 2016-07-30 NOTE — Telephone Encounter (Signed)
Patient is willing to do this. Referral form completed and faxed to [facility] [phone]  fax [phone]

## 2016-08-20 ENCOUNTER — Other Ambulatory Visit: Payer: Self-pay | Admitting: *Deleted

## 2016-08-20 MED ORDER — NITROGLYCERIN 0.4 MG SL SUBL: 0.4000 mg | SUBLINGUAL_TABLET | SUBLINGUAL | 3 refills | Status: DC | PRN

## 2016-12-17 DIAGNOSIS — M4727 Other spondylosis with radiculopathy, lumbosacral region: Secondary | ICD-10-CM | POA: Diagnosis not present

## 2016-12-17 DIAGNOSIS — J029 Acute pharyngitis, unspecified: Secondary | ICD-10-CM | POA: Diagnosis not present

## 2016-12-17 DIAGNOSIS — Z1389 Encounter for screening for other disorder: Secondary | ICD-10-CM | POA: Diagnosis not present

## 2016-12-17 DIAGNOSIS — R509 Fever, unspecified: Secondary | ICD-10-CM | POA: Diagnosis not present

## 2016-12-17 DIAGNOSIS — G609 Hereditary and idiopathic neuropathy, unspecified: Secondary | ICD-10-CM | POA: Diagnosis not present

## 2016-12-17 DIAGNOSIS — Z0000 Encounter for general adult medical examination without abnormal findings: Secondary | ICD-10-CM | POA: Diagnosis not present

## 2016-12-17 DIAGNOSIS — M5442 Lumbago with sciatica, left side: Secondary | ICD-10-CM | POA: Diagnosis not present

## 2016-12-17 DIAGNOSIS — D472 Monoclonal gammopathy: Secondary | ICD-10-CM | POA: Diagnosis not present

## 2016-12-17 DIAGNOSIS — G8929 Other chronic pain: Secondary | ICD-10-CM | POA: Diagnosis not present

## 2016-12-17 DIAGNOSIS — M5441 Lumbago with sciatica, right side: Secondary | ICD-10-CM | POA: Diagnosis not present

## 2016-12-18 DIAGNOSIS — G8929 Other chronic pain: Secondary | ICD-10-CM | POA: Diagnosis not present

## 2016-12-18 DIAGNOSIS — M5442 Lumbago with sciatica, left side: Secondary | ICD-10-CM | POA: Diagnosis not present

## 2016-12-18 DIAGNOSIS — D472 Monoclonal gammopathy: Secondary | ICD-10-CM | POA: Diagnosis not present

## 2016-12-18 DIAGNOSIS — M4727 Other spondylosis with radiculopathy, lumbosacral region: Secondary | ICD-10-CM | POA: Diagnosis not present

## 2016-12-18 DIAGNOSIS — R509 Fever, unspecified: Secondary | ICD-10-CM | POA: Diagnosis not present

## 2016-12-26 DIAGNOSIS — H04123 Dry eye syndrome of bilateral lacrimal glands: Secondary | ICD-10-CM | POA: Diagnosis not present

## 2016-12-26 DIAGNOSIS — H524 Presbyopia: Secondary | ICD-10-CM | POA: Diagnosis not present

## 2016-12-26 DIAGNOSIS — H353131 Nonexudative age-related macular degeneration, bilateral, early dry stage: Secondary | ICD-10-CM | POA: Diagnosis not present

## 2017-01-31 DIAGNOSIS — J029 Acute pharyngitis, unspecified: Secondary | ICD-10-CM | POA: Diagnosis not present

## 2017-01-31 DIAGNOSIS — K219 Gastro-esophageal reflux disease without esophagitis: Secondary | ICD-10-CM | POA: Diagnosis not present

## 2017-01-31 DIAGNOSIS — R1314 Dysphagia, pharyngoesophageal phase: Secondary | ICD-10-CM | POA: Diagnosis not present

## 2017-02-18 DIAGNOSIS — M5137 Other intervertebral disc degeneration, lumbosacral region: Secondary | ICD-10-CM | POA: Diagnosis not present

## 2017-02-18 DIAGNOSIS — M47817 Spondylosis without myelopathy or radiculopathy, lumbosacral region: Secondary | ICD-10-CM | POA: Diagnosis not present

## 2017-02-18 DIAGNOSIS — M545 Low back pain: Secondary | ICD-10-CM | POA: Diagnosis not present

## 2017-03-06 ENCOUNTER — Encounter (HOSPITAL_COMMUNITY): Payer: Self-pay

## 2017-03-06 ENCOUNTER — Emergency Department (HOSPITAL_COMMUNITY): Payer: Medicare Other

## 2017-03-06 ENCOUNTER — Inpatient Hospital Stay (HOSPITAL_COMMUNITY)
Admission: EM | Admit: 2017-03-06 | Discharge: 2017-03-09 | DRG: 308 | Disposition: A | Discharge status: Home / Self Care | Payer: Medicare Other | Attending: Internal Medicine | Admitting: Internal Medicine

## 2017-03-06 DIAGNOSIS — I495 Sick sinus syndrome: Secondary | ICD-10-CM | POA: Diagnosis not present

## 2017-03-06 DIAGNOSIS — Z79899 Other long term (current) drug therapy: Secondary | ICD-10-CM

## 2017-03-06 DIAGNOSIS — I6523 Occlusion and stenosis of bilateral carotid arteries: Secondary | ICD-10-CM | POA: Diagnosis present

## 2017-03-06 DIAGNOSIS — I481 Persistent atrial fibrillation: Secondary | ICD-10-CM | POA: Diagnosis present

## 2017-03-06 DIAGNOSIS — M1990 Unspecified osteoarthritis, unspecified site: Secondary | ICD-10-CM | POA: Diagnosis present

## 2017-03-06 DIAGNOSIS — R531 Weakness: Secondary | ICD-10-CM | POA: Diagnosis not present

## 2017-03-06 DIAGNOSIS — H538 Other visual disturbances: Secondary | ICD-10-CM | POA: Diagnosis present

## 2017-03-06 DIAGNOSIS — I480 Paroxysmal atrial fibrillation: Principal | ICD-10-CM | POA: Diagnosis present

## 2017-03-06 DIAGNOSIS — D472 Monoclonal gammopathy: Secondary | ICD-10-CM | POA: Diagnosis present

## 2017-03-06 DIAGNOSIS — M6281 Muscle weakness (generalized): Secondary | ICD-10-CM | POA: Diagnosis not present

## 2017-03-06 DIAGNOSIS — R29898 Other symptoms and signs involving the musculoskeletal system: Secondary | ICD-10-CM | POA: Diagnosis present

## 2017-03-06 DIAGNOSIS — R404 Transient alteration of awareness: Secondary | ICD-10-CM | POA: Diagnosis not present

## 2017-03-06 DIAGNOSIS — I252 Old myocardial infarction: Secondary | ICD-10-CM

## 2017-03-06 DIAGNOSIS — Z885 Allergy status to narcotic agent status: Secondary | ICD-10-CM

## 2017-03-06 DIAGNOSIS — F419 Anxiety disorder, unspecified: Secondary | ICD-10-CM | POA: Diagnosis present

## 2017-03-06 DIAGNOSIS — G629 Polyneuropathy, unspecified: Secondary | ICD-10-CM | POA: Diagnosis not present

## 2017-03-06 DIAGNOSIS — I4819 Other persistent atrial fibrillation: Secondary | ICD-10-CM | POA: Diagnosis present

## 2017-03-06 DIAGNOSIS — I4891 Unspecified atrial fibrillation: Secondary | ICD-10-CM

## 2017-03-06 DIAGNOSIS — R110 Nausea: Secondary | ICD-10-CM | POA: Diagnosis present

## 2017-03-06 DIAGNOSIS — I639 Cerebral infarction, unspecified: Secondary | ICD-10-CM | POA: Diagnosis present

## 2017-03-06 DIAGNOSIS — F341 Dysthymic disorder: Secondary | ICD-10-CM | POA: Diagnosis present

## 2017-03-06 DIAGNOSIS — Z8673 Personal history of transient ischemic attack (TIA), and cerebral infarction without residual deficits: Secondary | ICD-10-CM

## 2017-03-06 DIAGNOSIS — Z888 Allergy status to other drugs, medicaments and biological substances status: Secondary | ICD-10-CM

## 2017-03-06 DIAGNOSIS — R42 Dizziness and giddiness: Secondary | ICD-10-CM | POA: Diagnosis not present

## 2017-03-06 DIAGNOSIS — I10 Essential (primary) hypertension: Secondary | ICD-10-CM | POA: Diagnosis present

## 2017-03-06 DIAGNOSIS — I7389 Other specified peripheral vascular diseases: Secondary | ICD-10-CM | POA: Diagnosis present

## 2017-03-06 DIAGNOSIS — G4489 Other headache syndrome: Secondary | ICD-10-CM | POA: Diagnosis not present

## 2017-03-06 DIAGNOSIS — G8929 Other chronic pain: Secondary | ICD-10-CM | POA: Diagnosis present

## 2017-03-06 DIAGNOSIS — R5381 Other malaise: Secondary | ICD-10-CM | POA: Diagnosis present

## 2017-03-06 DIAGNOSIS — K589 Irritable bowel syndrome without diarrhea: Secondary | ICD-10-CM | POA: Diagnosis present

## 2017-03-06 DIAGNOSIS — R0600 Dyspnea, unspecified: Secondary | ICD-10-CM | POA: Diagnosis present

## 2017-03-06 DIAGNOSIS — M545 Low back pain: Secondary | ICD-10-CM | POA: Diagnosis present

## 2017-03-06 DIAGNOSIS — I341 Nonrheumatic mitral (valve) prolapse: Secondary | ICD-10-CM | POA: Diagnosis present

## 2017-03-06 LAB — DIFFERENTIAL
Basophils Absolute: 0 10*3/uL (ref 0.0–0.1)
Basophils Relative: 0 %
EOS PCT: 2 %
Eosinophils Absolute: 0.1 10*3/uL (ref 0.0–0.7)
LYMPHS ABS: 1.6 10*3/uL (ref 0.7–4.0)
Lymphocytes Relative: 30 %
Monocytes Absolute: 0.4 10*3/uL (ref 0.1–1.0)
Monocytes Relative: 8 %
NEUTROS ABS: 3.2 10*3/uL (ref 1.7–7.7)
NEUTROS PCT: 60 %

## 2017-03-06 LAB — PROTIME-INR
INR: 1.09
Prothrombin Time: 14.2 seconds (ref 11.4–15.2)

## 2017-03-06 LAB — CBC
HCT: 45.1 % (ref 36.0–46.0)
HEMOGLOBIN: 14.9 g/dL (ref 12.0–15.0)
MCH: 30.2 pg (ref 26.0–34.0)
MCHC: 33 g/dL (ref 30.0–36.0)
MCV: 91.5 fL (ref 78.0–100.0)
Platelets: 175 10*3/uL (ref 150–400)
RBC: 4.93 MIL/uL (ref 3.87–5.11)
RDW: 13.8 % (ref 11.5–15.5)
WBC: 5.3 10*3/uL (ref 4.0–10.5)

## 2017-03-06 LAB — APTT: aPTT: 36 seconds (ref 24–36)

## 2017-03-06 LAB — I-STAT TROPONIN, ED: TROPONIN I, POC: 0.07 ng/mL (ref 0.00–0.08)

## 2017-03-06 MED ORDER — SODIUM CHLORIDE 0.9 % IV BOLUS (SEPSIS)
1000.0000 mL | Freq: Once | INTRAVENOUS | Status: AC
  Administered 2017-03-06: 1000 mL via INTRAVENOUS

## 2017-03-06 NOTE — ED Triage Notes (Signed)
Pt BIB [facility] from home where pt reports feeling weak and dizzy since Tuesday of this week. Pt states that it got worse tonight feeling like she was "going to pass out" when she called EMS. Pt reports hx of MI that presented same way.

## 2017-03-06 NOTE — ED Notes (Signed)
Pt transported to XR.  
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
## 2017-03-07 ENCOUNTER — Observation Stay (HOSPITAL_COMMUNITY): Payer: Medicare Other

## 2017-03-07 ENCOUNTER — Encounter (HOSPITAL_COMMUNITY): Payer: Self-pay | Admitting: Family Medicine

## 2017-03-07 DIAGNOSIS — R0600 Dyspnea, unspecified: Secondary | ICD-10-CM | POA: Diagnosis present

## 2017-03-07 DIAGNOSIS — M545 Low back pain: Secondary | ICD-10-CM | POA: Diagnosis present

## 2017-03-07 DIAGNOSIS — R29898 Other symptoms and signs involving the musculoskeletal system: Secondary | ICD-10-CM | POA: Diagnosis present

## 2017-03-07 DIAGNOSIS — I480 Paroxysmal atrial fibrillation: Secondary | ICD-10-CM | POA: Diagnosis not present

## 2017-03-07 DIAGNOSIS — M1990 Unspecified osteoarthritis, unspecified site: Secondary | ICD-10-CM | POA: Diagnosis present

## 2017-03-07 DIAGNOSIS — I4891 Unspecified atrial fibrillation: Secondary | ICD-10-CM

## 2017-03-07 DIAGNOSIS — R531 Weakness: Secondary | ICD-10-CM | POA: Diagnosis not present

## 2017-03-07 DIAGNOSIS — H538 Other visual disturbances: Secondary | ICD-10-CM | POA: Diagnosis present

## 2017-03-07 DIAGNOSIS — I495 Sick sinus syndrome: Secondary | ICD-10-CM

## 2017-03-07 DIAGNOSIS — K589 Irritable bowel syndrome without diarrhea: Secondary | ICD-10-CM | POA: Diagnosis present

## 2017-03-07 DIAGNOSIS — F341 Dysthymic disorder: Secondary | ICD-10-CM | POA: Diagnosis not present

## 2017-03-07 DIAGNOSIS — Z79899 Other long term (current) drug therapy: Secondary | ICD-10-CM | POA: Diagnosis not present

## 2017-03-07 DIAGNOSIS — I341 Nonrheumatic mitral (valve) prolapse: Secondary | ICD-10-CM | POA: Diagnosis present

## 2017-03-07 DIAGNOSIS — I4819 Other persistent atrial fibrillation: Secondary | ICD-10-CM | POA: Diagnosis present

## 2017-03-07 DIAGNOSIS — I481 Persistent atrial fibrillation: Secondary | ICD-10-CM | POA: Diagnosis present

## 2017-03-07 DIAGNOSIS — I340 Nonrheumatic mitral (valve) insufficiency: Secondary | ICD-10-CM | POA: Diagnosis not present

## 2017-03-07 DIAGNOSIS — I6523 Occlusion and stenosis of bilateral carotid arteries: Secondary | ICD-10-CM | POA: Diagnosis present

## 2017-03-07 DIAGNOSIS — I7389 Other specified peripheral vascular diseases: Secondary | ICD-10-CM | POA: Diagnosis present

## 2017-03-07 DIAGNOSIS — R42 Dizziness and giddiness: Secondary | ICD-10-CM | POA: Diagnosis not present

## 2017-03-07 DIAGNOSIS — Z888 Allergy status to other drugs, medicaments and biological substances status: Secondary | ICD-10-CM | POA: Diagnosis not present

## 2017-03-07 DIAGNOSIS — I10 Essential (primary) hypertension: Secondary | ICD-10-CM | POA: Diagnosis not present

## 2017-03-07 DIAGNOSIS — Z885 Allergy status to narcotic agent status: Secondary | ICD-10-CM | POA: Diagnosis not present

## 2017-03-07 DIAGNOSIS — I639 Cerebral infarction, unspecified: Secondary | ICD-10-CM | POA: Diagnosis not present

## 2017-03-07 DIAGNOSIS — G459 Transient cerebral ischemic attack, unspecified: Secondary | ICD-10-CM | POA: Diagnosis not present

## 2017-03-07 DIAGNOSIS — I6319 Cerebral infarction due to embolism of other precerebral artery: Secondary | ICD-10-CM | POA: Diagnosis not present

## 2017-03-07 DIAGNOSIS — D472 Monoclonal gammopathy: Secondary | ICD-10-CM | POA: Diagnosis present

## 2017-03-07 DIAGNOSIS — R5381 Other malaise: Secondary | ICD-10-CM | POA: Diagnosis present

## 2017-03-07 DIAGNOSIS — I252 Old myocardial infarction: Secondary | ICD-10-CM | POA: Diagnosis not present

## 2017-03-07 DIAGNOSIS — Z8673 Personal history of transient ischemic attack (TIA), and cerebral infarction without residual deficits: Secondary | ICD-10-CM

## 2017-03-07 DIAGNOSIS — G8929 Other chronic pain: Secondary | ICD-10-CM | POA: Diagnosis present

## 2017-03-07 DIAGNOSIS — G629 Polyneuropathy, unspecified: Secondary | ICD-10-CM | POA: Diagnosis present

## 2017-03-07 DIAGNOSIS — F419 Anxiety disorder, unspecified: Secondary | ICD-10-CM | POA: Diagnosis present

## 2017-03-07 DIAGNOSIS — R110 Nausea: Secondary | ICD-10-CM | POA: Diagnosis present

## 2017-03-07 LAB — URINALYSIS, ROUTINE W REFLEX MICROSCOPIC
BACTERIA UA: NONE SEEN
BILIRUBIN URINE: NEGATIVE
Glucose, UA: NEGATIVE mg/dL
Ketones, ur: NEGATIVE mg/dL
Nitrite: NEGATIVE
Protein, ur: NEGATIVE mg/dL
SPECIFIC GRAVITY, URINE: 1.004 — AB (ref 1.005–1.030)
pH: 7 (ref 5.0–8.0)

## 2017-03-07 LAB — BASIC METABOLIC PANEL
Anion gap: 7 (ref 5–15)
BUN: 10 mg/dL (ref 6–20)
CHLORIDE: 111 mmol/L (ref 101–111)
CO2: 23 mmol/L (ref 22–32)
CREATININE: 0.57 mg/dL (ref 0.44–1.00)
Calcium: 8.9 mg/dL (ref 8.9–10.3)
GFR calc Af Amer: >60 mL/min (ref >60)
GFR calc non Af Amer: >60 mL/min (ref >60)
Glucose, Bld: 118 mg/dL — ABNORMAL HIGH (ref 65–99)
Potassium: 3.7 mmol/L (ref 3.5–5.1)
SODIUM: 141 mmol/L (ref 135–145)

## 2017-03-07 LAB — RAPID URINE DRUG SCREEN, HOSP PERFORMED
AMPHETAMINES: NOT DETECTED
BENZODIAZEPINES: NOT DETECTED
Barbiturates: NOT DETECTED
COCAINE: NOT DETECTED
Opiates: NOT DETECTED
Tetrahydrocannabinol: NOT DETECTED

## 2017-03-07 LAB — LIPID PANEL
Cholesterol: 175 mg/dL (ref 0–200)
HDL: 76 mg/dL (ref >40)
LDL Cholesterol: 91 mg/dL (ref 0–99)
Total CHOL/HDL Ratio: 2.3 RATIO
Triglycerides: 41 mg/dL (ref <150)
VLDL: 8 mg/dL (ref 0–40)

## 2017-03-07 LAB — CBC
HCT: 44.5 % (ref 36.0–46.0)
Hemoglobin: 14.8 g/dL (ref 12.0–15.0)
MCH: 30.1 pg (ref 26.0–34.0)
MCHC: 33.3 g/dL (ref 30.0–36.0)
MCV: 90.6 fL (ref 78.0–100.0)
Platelets: 161 10*3/uL (ref 150–400)
RBC: 4.91 MIL/uL (ref 3.87–5.11)
RDW: 13.5 % (ref 11.5–15.5)
WBC: 5.6 10*3/uL (ref 4.0–10.5)

## 2017-03-07 LAB — COMPREHENSIVE METABOLIC PANEL
ALT: 16 U/L (ref 14–54)
ANION GAP: 10 (ref 5–15)
AST: 29 U/L (ref 15–41)
Albumin: 4.2 g/dL (ref 3.5–5.0)
Alkaline Phosphatase: 63 U/L (ref 38–126)
BUN: 16 mg/dL (ref 6–20)
CO2: 23 mmol/L (ref 22–32)
Calcium: 9.4 mg/dL (ref 8.9–10.3)
Chloride: 104 mmol/L (ref 101–111)
Creatinine, Ser: 0.68 mg/dL (ref 0.44–1.00)
GFR calc Af Amer: >60 mL/min (ref >60)
Glucose, Bld: 146 mg/dL — ABNORMAL HIGH (ref 65–99)
POTASSIUM: 4.2 mmol/L (ref 3.5–5.1)
Sodium: 137 mmol/L (ref 135–145)
TOTAL PROTEIN: 7.1 g/dL (ref 6.5–8.1)
Total Bilirubin: 0.7 mg/dL (ref 0.3–1.2)

## 2017-03-07 LAB — TROPONIN I: TROPONIN I: 0.1 ng/mL — AB (ref <0.03)

## 2017-03-07 LAB — MAGNESIUM: MAGNESIUM: 1.9 mg/dL (ref 1.7–2.4)

## 2017-03-07 LAB — TSH: TSH: 3.043 u[IU]/mL (ref 0.350–4.500)

## 2017-03-07 LAB — ETHANOL: Alcohol, Ethyl (B): <5 mg/dL (ref <5)

## 2017-03-07 MED ORDER — HEPARIN BOLUS VIA INFUSION
3000.0000 [IU] | Freq: Once | INTRAVENOUS | Status: DC
  Filled 2017-03-07: qty 3000

## 2017-03-07 MED ORDER — LORAZEPAM 2 MG/ML IJ SOLN: 0.5000 mg | Freq: Four times a day (QID) | INTRAMUSCULAR | Status: DC | PRN

## 2017-03-07 MED ORDER — PROCHLORPERAZINE EDISYLATE 5 MG/ML IJ SOLN
10.0000 mg | Freq: Once | INTRAMUSCULAR | Status: AC
  Administered 2017-03-07: 10 mg via INTRAVENOUS
  Filled 2017-03-07: qty 2

## 2017-03-07 MED ORDER — SODIUM CHLORIDE 0.9 % IV BOLUS (SEPSIS)
1000.0000 mL | Freq: Once | INTRAVENOUS | Status: AC
  Administered 2017-03-07: 1000 mL via INTRAVENOUS

## 2017-03-07 MED ORDER — ONDANSETRON HCL 4 MG/2ML IJ SOLN: 4.0000 mg | Freq: Four times a day (QID) | INTRAMUSCULAR | Status: DC | PRN

## 2017-03-07 MED ORDER — ALPRAZOLAM 0.25 MG PO TABS
0.2500 mg | ORAL_TABLET | Freq: Three times a day (TID) | ORAL | Status: DC | PRN
  Administered 2017-03-07 – 2017-03-08 (×3): 0.25 mg via ORAL
  Filled 2017-03-07 (×3): qty 1

## 2017-03-07 MED ORDER — ACETAMINOPHEN 325 MG PO TABS: 650.0000 mg | ORAL_TABLET | Freq: Four times a day (QID) | ORAL | Status: DC | PRN

## 2017-03-07 MED ORDER — STROKE: EARLY STAGES OF RECOVERY BOOK
Freq: Once | Status: AC
  Administered 2017-03-07: 03:00:00
  Filled 2017-03-07: qty 1

## 2017-03-07 MED ORDER — METOPROLOL TARTRATE 25 MG PO TABS
25.0000 mg | ORAL_TABLET | Freq: Four times a day (QID) | ORAL | Status: DC
  Administered 2017-03-08: 25 mg via ORAL
  Filled 2017-03-07 (×3): qty 1

## 2017-03-07 MED ORDER — ONDANSETRON HCL 4 MG PO TABS: 4.0000 mg | ORAL_TABLET | Freq: Four times a day (QID) | ORAL | Status: DC | PRN

## 2017-03-07 MED ORDER — MAGNESIUM SULFATE IN D5W 1-5 GM/100ML-% IV SOLN
1.0000 g | Freq: Once | INTRAVENOUS | Status: AC
  Administered 2017-03-07: 1 g via INTRAVENOUS
  Filled 2017-03-07: qty 100

## 2017-03-07 MED ORDER — ENOXAPARIN SODIUM 40 MG/0.4ML ~~LOC~~ SOLN
40.0000 mg | Freq: Every day | SUBCUTANEOUS | Status: DC
  Filled 2017-03-07: qty 0.4

## 2017-03-07 MED ORDER — ACETAMINOPHEN 650 MG RE SUPP: 650.0000 mg | Freq: Four times a day (QID) | RECTAL | Status: DC | PRN

## 2017-03-07 MED ORDER — DIPHENHYDRAMINE HCL 50 MG/ML IJ SOLN
25.0000 mg | Freq: Once | INTRAMUSCULAR | Status: AC
  Administered 2017-03-07: 25 mg via INTRAVENOUS
  Filled 2017-03-07: qty 1

## 2017-03-07 MED ORDER — ASPIRIN 300 MG RE SUPP: 300.0000 mg | Freq: Every day | RECTAL | Status: DC

## 2017-03-07 MED ORDER — ASPIRIN 325 MG PO TABS
325.0000 mg | ORAL_TABLET | Freq: Every day | ORAL | Status: DC
  Administered 2017-03-07 – 2017-03-08 (×2): 325 mg via ORAL
  Filled 2017-03-07 (×2): qty 1

## 2017-03-07 MED ORDER — HEPARIN (PORCINE) IN NACL 100-0.45 UNIT/ML-% IJ SOLN: 650.0000 [IU]/h | INTRAMUSCULAR | Status: DC

## 2017-03-07 MED ORDER — LORAZEPAM 2 MG/ML IJ SOLN: 1.0000 mg | Freq: Once | INTRAMUSCULAR | Status: DC

## 2017-03-07 MED ORDER — HYDROCODONE-ACETAMINOPHEN 5-325 MG PO TABS: 1.0000 | ORAL_TABLET | Freq: Four times a day (QID) | ORAL | Status: DC | PRN

## 2017-03-07 MED ORDER — SODIUM CHLORIDE 0.9 % IV SOLN
INTRAVENOUS | Status: DC
  Administered 2017-03-07: 03:00:00 via INTRAVENOUS

## 2017-03-07 NOTE — Care Management Obs Status (Signed)
[facility] NOTIFICATION   Patient Details  Name: Kaitlin [name] MRN: [id number] Date of Birth: 1926/11/17   Medicare Observation Status Notification Given:  Yes    Dawayne Patricia, RN 03/07/2017, 5:12 PM

## 2017-03-07 NOTE — Progress Notes (Signed)
PROGRESS NOTE    Kaitlin [name]  SEG:[id number] DOB: 1927-08-16 DOA: 03/06/2017 PCP: [name], MD    Brief Narrative:  83 yo female presents with dizziness. Patient known to have tachy-bradi syndrome, paroxysmal atrial fibrillation and MUGS. Symptoms present for the last 2 days, acute episodic dizziness, associated with headache, blurry vision and palpitations. Improved with rest. Occurred x2 times. Note exertional related. Patient reported transitory left leg weakness. On the initial physical examination, patient hemodynamically stable with blood pressure 143/88, HR 83, RR 24 with temperature 97. Oral mucosa moist, lungs clear to auscultation, rhythmic S1 and S2. Abdomen soft and no lower extremity edema. Non focal. Admitted to rule out decompensated arrhythmia.    Assessment & Plan:   Principal Problem:   Tachy-brady syndrome (HCC) Active Problems:   ANXIETY DEPRESSION   AF (paroxysmal atrial fibrillation) (HCC)   Essential hypertension, benign   Left leg weakness   1. Tachy-Brady syndrome/ paroxysmal atrial fibrillation. Symptoms likely due to arrhythmia, case discussed with EP and recommendations for pace maker. Patient refusing anticoagulation. Rate controlled, ekg and telemetry personally reviewed, noted atrial fibrillation rhythm.   2. HTN. Systolic blood pressure 737, patient clinically euvolemic, will dc IV fluids. Patient not on antihypertensive agents.   3. Acute CVA. MRI with acute small lacunar infarcts with the left cerebellum and pons. Will follow on carotid artery [place] and echocardiography. Patient with no swallow dysfunction. Follow on neurology and physical therapy recommendations. Will continue asa, patient refusing full anticoagulation.   4. Anxiety. Will dc IV lorazepam and will resume home alprazolam as needed.  5. Chronic back pain. Will resume home oxycodone.    DVT prophylaxis: enoxaparin  Code Status: Full  Family Communication:  Disposition Plan:     Consultants:   Cardiology -> EP  Procedures:    Antimicrobials:     Subjective: No further dizzy spells, no chest pain, no nausea or vomiting. No lower extremity weakness.  Objective: Vitals:   03/07/17 0115 03/07/17 0146 03/07/17 0227 03/07/17 0547  BP: (!) 154/95 (!) 143/88 138/73 (!) 152/75  Pulse: 93 83 83 74  Resp: (!) 25 (!) 24 18 18   Temp:   97.5 F (36.4 C) 97.7 F (36.5 C)  TempSrc:   Oral Oral  SpO2: 97% 97% 98% 98%  Weight:   49.7 kg (109 lb 8 oz)   Height:   5\' 5"  (1.651 m)     Intake/Output Summary (Last 24 hours) at 03/07/17 1208 Last data filed at 03/07/17 0854  Gross per 24 hour  Intake              280 ml  Output             1300 ml  Net            -1020 ml   Filed Weights   03/06/17 2126 03/07/17 0227  Weight: 49.9 kg (110 lb) 49.7 kg (109 lb 8 oz)    Examination:  General exam: deconditioned E ENT. No pallor or icterus.  Respiratory system: Clear to auscultation. Respiratory effort normal. No wheezing, rales or rhonchi.  Cardiovascular system: S1 & S2 heard, irregulary irregular No JVD, murmurs, rubs, gallops or clicks. No pedal edema. Gastrointestinal system: Abdomen is nondistended, soft and nontender. No organomegaly or masses felt. Normal bowel sounds heard. Central nervous system: Alert and oriented. No focal neurological deficits. Extremities: Symmetric 5 x 5 power. Skin: No rashes, lesions or ulcers .     Data Reviewed: I have personally  reviewed following labs and imaging studies  CBC:  Recent Labs Lab 03/06/17 2242 03/07/17 0252  WBC 5.3 5.6  NEUTROABS 3.2  --   HGB 14.9 14.8  HCT 45.1 44.5  MCV 91.5 90.6  PLT 175 528   Basic Metabolic Panel:  Recent Labs Lab 03/06/17 2242 03/07/17 0252  NA 137 141  K 4.2 3.7  CL 104 111  CO2 23 23  GLUCOSE 146* 118*  BUN 16 10  CREATININE 0.68 0.57  CALCIUM 9.4 8.9  MG 1.9  --    GFR: Estimated Creatinine Clearance: 38.9 mL/min (by C-G formula based on SCr of  0.57 mg/dL). Liver Function Tests:  Recent Labs Lab 03/06/17 2242  AST 29  ALT 16  ALKPHOS 63  BILITOT 0.7  PROT 7.1  ALBUMIN 4.2   No results for input(s): LIPASE, AMYLASE in the last 168 hours. No results for input(s): AMMONIA in the last 168 hours. Coagulation Profile:  Recent Labs Lab 03/06/17 2242  INR 1.09   Cardiac Enzymes:  Recent Labs Lab 03/07/17 0252  TROPONINI 0.10*   BNP (last 3 results) No results for input(s): PROBNP in the last 8760 hours. HbA1C: No results for input(s): HGBA1C in the last 72 hours. CBG: No results for input(s): GLUCAP in the last 168 hours. Lipid Profile:  Recent Labs  03/07/17 0252  CHOL 175  HDL 76  LDLCALC 91  TRIG 41  CHOLHDL 2.3   Thyroid Function Tests:  Recent Labs  03/07/17 0252  TSH 3.043   Anemia Panel: No results for input(s): VITAMINB12, FOLATE, FERRITIN, TIBC, IRON, RETICCTPCT in the last 72 hours. Sepsis Labs: No results for input(s): PROCALCITON, LATICACIDVEN in the last 168 hours.  No results found for this or any previous visit (from the past 240 hour(s)).       Radiology Studies: Dg Chest 2 View  Result Date: 03/07/2017 CLINICAL DATA:  Acute onset of generalized weakness and dizziness. Near syncope. Initial encounter. EXAM: CHEST  2 VIEW COMPARISON:  Chest radiograph performed 05/14/2016 FINDINGS: The lungs are well-aerated and clear. There is no evidence of focal opacification, pleural effusion or pneumothorax. The heart is borderline enlarged. No acute osseous abnormalities are seen. IMPRESSION: Borderline cardiomegaly.  Lungs remain grossly clear. Electronically Signed   By: Garald Balding M.D.   On: 03/07/2017 00:11   Ct Head Wo Contrast  Result Date: 03/07/2017 CLINICAL DATA:  Acute onset of generalized weakness and dizziness. Near syncope. Initial encounter. EXAM: CT HEAD WITHOUT CONTRAST TECHNIQUE: Contiguous axial images were obtained from the base of the skull through the vertex without  intravenous contrast. COMPARISON:  CT of the head performed 09/26/2008, and MRI of the brain performed 12/15/2015 FINDINGS: Brain: No evidence of acute infarction, hemorrhage, hydrocephalus, extra-axial collection or mass lesion/mass effect. Prominence of the ventricles and sulci reflects mild cortical volume loss. Mild cerebellar atrophy is noted. Mild subcortical and periventricular matter change likely reflects small vessel ischemic microangiopathy. The brainstem and fourth ventricle are within normal limits. The basal ganglia are unremarkable in appearance. The cerebral hemispheres demonstrate grossly normal gray-white differentiation. No mass effect or midline shift is seen. Vascular: No hyperdense vessel or unexpected calcification. Skull: There is no evidence of fracture; visualized osseous structures are unremarkable in appearance. Sinuses/Orbits: The orbits are within normal limits. The paranasal sinuses and mastoid air cells are well-aerated. Other: No significant soft tissue abnormalities are seen. IMPRESSION: 1. No acute intracranial pathology seen on CT. 2. Mild cortical volume loss and scattered small vessel  ischemic microangiopathy. Electronically Signed   By: Garald Balding M.D.   On: 03/07/2017 00:13   Mr Brain Wo Contrast  Result Date: 03/07/2017 CLINICAL DATA:  83 year old female with episodes of dizziness, blurred vision, nausea and transient left extremity weakness for 2 days. EXAM: MRI HEAD WITHOUT CONTRAST TECHNIQUE: Multiplanar, multiecho pulse sequences of the brain and surrounding structures were obtained without intravenous contrast. COMPARISON:  Head CT without contrast 0004 hours today. Brain MRI 12/15/2015. FINDINGS: Brain: 3 subcentimeter foci of restricted diffusion are identified in the left posterior fossa. Small 3-4 mm foci of restricted diffusion are noted in the central left cerebellum and the central left pons, with a larger 7-8 mm restricted focus along the superior left  cerebellar folia (series 3, image 19). Mild associated T2 and FLAIR hyperintensity. No associated hemorrhage or mass effect. No other restricted diffusion. No midline shift, mass effect, evidence of mass lesion, ventriculomegaly, extra-axial collection or acute intracranial hemorrhage. Cervicomedullary junction and pituitary are within normal limits. No chronic cerebral blood products are identified. Minimal to mild for age scattered small cerebral white matter T2 and FLAIR hyperintense foci are stable since 2017. There is an unchanged subtle chronic lacunar infarct in the inferior right cerebellum (series 5, image 14). No cortical encephalomalacia identified. Vascular: Major intracranial vascular flow voids are stable since 2017 and appear normal. Skull and upper cervical spine: Negative. Visualized bone marrow signal is within normal limits. Sinuses/Orbits: Postoperative changes to the globes, otherwise normal orbit soft tissues. Visualized paranasal sinuses and mastoids are stable and well pneumatized. Other: Negative scalp soft tissues. IMPRESSION: 1. Several small acute lacunar type infarcts in the left cerebellum and pons. These are in the basilar perforator artery territory, the left SCA and AICA territories. 2. No associated hemorrhage or mass effect. 3. Otherwise stable since 2017 and largely unremarkable for age noncontrast MRI appearance of the brain. Electronically Signed   By: Genevie Ann M.D.   On: 03/07/2017 10:49        Scheduled Meds: . aspirin  300 mg Rectal Daily   Or  . aspirin  325 mg Oral Daily  . enoxaparin (LOVENOX) injection  40 mg Subcutaneous Daily  . LORazepam  1 mg Intravenous Once   Continuous Infusions: . sodium chloride 75 mL/hr at 03/07/17 0311     LOS: 0 days        Tawni Millers, MD [facility] Pager [phone]  If 7PM-7AM, please contact night-coverage [email or web address] Password [email or web address] 03/07/2017, 12:08 PM

## 2017-03-07 NOTE — Progress Notes (Signed)
SLP Cancellation Note  Patient Details Name: Kaitlin [name] MRN: [id number] DOB: 01-Jun-1927   Cancelled treatment:       Reason Eval/Treat Not Completed: Patient at procedure or test/unavailable; Pt off the unit for MRI, will follow up for swallow eval as pt is available.     [name], Selinda Orion 03/07/2017, 9:14 AM
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
## 2017-03-07 NOTE — ED Notes (Signed)
Pt daughter leaving for the night, phone number below to notify pt of important changes in plan of care. Gloria: [phone]
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
## 2017-03-07 NOTE — Evaluation (Signed)
Physical Therapy Evaluation/ Discharge Patient Details Name: Kaitlin [name] MRN: [id number] DOB: 1927/06/15 Today's Date: 03/07/2017   History of Present Illness  83 yo admitted with dizziness and left weakness. Pt with tachybrady syndrome and small Left pons and cerebellar infarcts. PMHx: tachy-brady, Afib, IBS  Clinical Impression  Pt very pleasant, standing in room on arrival. Pt lives alone, garden, travels to granddaughters ball games and stays active. Pt without current deficits in strength, sensation, balance or function and had returned to baseline functional level. Pt with HR 76-93 with activity and no difficulty. No further therapy needs at this time with pt aware and agreeable. Will sign off.     Follow Up Recommendations No PT follow up    Equipment Recommendations  None recommended by PT    Recommendations for Other Services       Precautions / Restrictions Precautions Precautions: Other (comment) Precaution Comments: watch HR      Mobility  Bed Mobility                  Transfers Overall transfer level: Independent                  Ambulation/Gait Ambulation/Gait assistance: Independent Ambulation Distance (Feet): 300 Feet Assistive device: None Gait Pattern/deviations: WFL(Within Functional Limits)   Gait velocity interpretation: at or above normal speed for age/gender    Stairs Stairs: Yes Stairs assistance: Modified independent (Device/Increase time) Stair Management: One rail Right;Alternating pattern;Forwards Number of Stairs: 4    Wheelchair Mobility    Modified Rankin (Stroke Patients Only)       Balance Overall balance assessment: No apparent balance deficits (not formally assessed)                                           Pertinent Vitals/Pain Pain Assessment: No/denies pain    Home Living Family/patient expects to be discharged to:: Private residence Living Arrangements: Alone Available Help  at Discharge: Family;Available PRN/intermittently Type of Home: House Home Access: Stairs to enter   Entrance Stairs-Number of Steps: 4 Home Layout: One level Home Equipment: None      Prior Function Level of Independence: Independent               Hand Dominance        Extremity/Trunk Assessment   Upper Extremity Assessment Upper Extremity Assessment: Overall WFL for tasks assessed    Lower Extremity Assessment Lower Extremity Assessment: Overall WFL for tasks assessed (bil hip flexion 4/5, knee flexion/extension and dorsiflexion 5/5, intact sensation)    Cervical / Trunk Assessment Cervical / Trunk Assessment: Normal  Communication   Communication: No difficulties  Cognition Arousal/Alertness: Awake/alert Behavior During Therapy: WFL for tasks assessed/performed Overall Cognitive Status: Within Functional Limits for tasks assessed                                        General Comments      Exercises     Assessment/Plan    PT Assessment Patent does not need any further PT services  PT Problem List         PT Treatment Interventions      PT Goals (Current goals can be found in the Care Plan section)  Acute Rehab PT Goals PT Goal Formulation: All assessment  and education complete, DC therapy    Frequency     Barriers to discharge        Co-evaluation               AM-PAC PT "6 Clicks" Daily Activity  Outcome Measure Difficulty turning over in bed (including adjusting bedclothes, sheets and blankets)?: None Difficulty moving from lying on back to sitting on the side of the bed? : None Difficulty sitting down on and standing up from a chair with arms (e.g., wheelchair, bedside commode, etc,.)?: None Help needed moving to and from a bed to chair (including a wheelchair)?: None Help needed walking in hospital room?: None Help needed climbing 3-5 steps with a railing? : None 6 Click Score: 24    End of Session    Activity Tolerance: Patient tolerated treatment well Patient left: in chair;with call bell/phone within reach;with family/visitor present Nurse Communication: Mobility status PT Visit Diagnosis: Dizziness and giddiness (R42)    Time: 1121-6244 PT Time Calculation (min) (ACUTE ONLY): 15 min   Charges:   PT Evaluation $PT Eval Low Complexity: 1 Procedure     PT G Codes:   PT G-Codes **NOT FOR INPATIENT CLASS** Functional Assessment Tool Used: AM-PAC 6 Clicks Basic Mobility Functional Limitation: Mobility: Walking and moving around Mobility: Walking and Moving Around Current Status (C9507): 0 percent impaired, limited or restricted Mobility: Walking and Moving Around Goal Status (K2575): 0 percent impaired, limited or restricted Mobility: Walking and Moving Around Discharge Status (Y5183): 0 percent impaired, limited or restricted    Elwyn Reach, PT [place] 03/07/2017, 1:47 PM

## 2017-03-07 NOTE — ED Notes (Signed)
Per MRI, pt currently being scanned. Pt to be transported to her inpatient room and brought to MRI at a later time, will relay information to nurse getting report.
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
## 2017-03-07 NOTE — Progress Notes (Signed)
OT Cancellation Note  Patient Details Name: Kaitlin [name] MRN: [id number] DOB: [birth year]   Cancelled Treatment:    Reason Eval/Treat Not Completed: OT screened, no needs identified, will sign off.  Malka So 03/07/2017, 2:04 PM

## 2017-03-08 ENCOUNTER — Inpatient Hospital Stay (HOSPITAL_COMMUNITY): Payer: Medicare Other

## 2017-03-08 DIAGNOSIS — R42 Dizziness and giddiness: Secondary | ICD-10-CM

## 2017-03-08 DIAGNOSIS — G459 Transient cerebral ischemic attack, unspecified: Secondary | ICD-10-CM

## 2017-03-08 DIAGNOSIS — I6319 Cerebral infarction due to embolism of other precerebral artery: Secondary | ICD-10-CM

## 2017-03-08 DIAGNOSIS — I340 Nonrheumatic mitral (valve) insufficiency: Secondary | ICD-10-CM

## 2017-03-08 DIAGNOSIS — I480 Paroxysmal atrial fibrillation: Principal | ICD-10-CM

## 2017-03-08 LAB — CBC
HCT: 41.4 % (ref 36.0–46.0)
HEMOGLOBIN: 13.5 g/dL (ref 12.0–15.0)
MCH: 29.9 pg (ref 26.0–34.0)
MCHC: 32.6 g/dL (ref 30.0–36.0)
MCV: 91.6 fL (ref 78.0–100.0)
PLATELETS: 166 10*3/uL (ref 150–400)
RBC: 4.52 MIL/uL (ref 3.87–5.11)
RDW: 13.7 % (ref 11.5–15.5)
WBC: 6 10*3/uL (ref 4.0–10.5)

## 2017-03-08 LAB — ECHOCARDIOGRAM COMPLETE
HEIGHTINCHES: 65 in
Weight: 1752 oz

## 2017-03-08 LAB — HEMOGLOBIN A1C
Hgb A1c MFr Bld: 5 % (ref 4.8–5.6)
Mean Plasma Glucose: 97 mg/dL

## 2017-03-08 MED ORDER — ATORVASTATIN CALCIUM 40 MG PO TABS
40.0000 mg | ORAL_TABLET | Freq: Every day | ORAL | Status: DC
  Filled 2017-03-08: qty 1

## 2017-03-08 MED ORDER — METOPROLOL TARTRATE 25 MG PO TABS
37.5000 mg | ORAL_TABLET | Freq: Two times a day (BID) | ORAL | Status: DC
  Administered 2017-03-08: 37.5 mg via ORAL
  Administered 2017-03-09: 25 mg via ORAL
  Filled 2017-03-08 (×3): qty 1

## 2017-03-08 NOTE — Progress Notes (Signed)
VASCULAR LAB PRELIMINARY  PRELIMINARY  PRELIMINARY  PRELIMINARY  Carotid duplex completed.    Preliminary report:  Bilateral:  1-39% ICA stenosis.  Vertebral artery flow is antegrade.     Kaitlin [name], [place], RVS 03/08/2017, 8:55 AM

## 2017-03-08 NOTE — Progress Notes (Signed)
PROGRESS NOTE    Kaitlin [name]  ZOX:[id number] DOB: [birth year] DOA: 03/06/2017 PCP: [name], MD     Brief Narrative:  83 yo female presents with dizziness. Patient known to have tachy-bradi syndrome, paroxysmal atrial fibrillation and MUGS. Symptoms present for the last 2 days, acute episodic dizziness, associated with headache, blurry vision and palpitations. Improved with rest. Occurred x2 times. Note exertional related. Patient reported transitory left leg weakness. On the initial physical examination, patient hemodynamically stable with blood pressure 143/88, HR 83, RR 24 with temperature 97. Oral mucosa moist, lungs clear to auscultation, rhythmic S1 and S2. Abdomen soft and no lower extremity edema. Non focal. Admitted to rule out decompensated arrhythmia.    Assessment & Plan:   Principal Problem:   Tachy-brady syndrome (HCC) Active Problems:   ANXIETY DEPRESSION   AF (paroxysmal atrial fibrillation) (HCC)   Essential hypertension, benign   Left leg weakness   Atrial fibrillation with RVR (HCC)   Atrial fibrillation (HCC)   Cerebrovascular accident (CVA) ([place])   1. Tachy-Brady syndrome/ paroxysmal atrial fibrillation with sinus pauses when on sinus rhythm. Patient has remained in atrial fibrillation, low risk for pauses, since pauses (more than 4 seconds) occurred while patient was on sinus rhythm. Will continue low dose metoprolol per cardiology recommendations, continue telemetry monitoring. Anticoagulation with apixban 3 days post ischemic CVA per neurology recommendations.  2. HTN. Systolic blood pressure 811 to 140. Patient off IV fluids.  3. Acute CVA, acute small lacunar infarcts in the left cerebellum and pons. Echocardiogram with no signs of embolic phenomena, US carotids report pending. Continue antiplatelet therapy with asa for now with plan to transition to apixaban in am. Follow with physical therapy recommendations. May need statin therapy, check lipid  profile.   4. Anxiety. Continue alprazolam, good toleration.   5. Chronic back pain. Will resume home oxycodone with good toleration.    DVT prophylaxis: enoxaparin  Code Status: Full  Family Communication:  Disposition Plan:    Consultants:   Cardiology -> EP  Procedures:    Antimicrobials:     Subjective: Patient feeling better, no chest pain or dyspnea, no palpitations or syncope. No nausea or vomiting. Has been ambulating. Patient has decided to proceed with anticoagulation.   Objective: Vitals:   03/08/17 0113 03/08/17 0356 03/08/17 0704 03/08/17 1251  BP: (!) 166/88 (!) 113/57 123/78 (!) 147/73  Pulse: 63 63 (!) 57 76  Resp:  20  (!) 21  Temp:  97.4 F (36.3 C)  97.3 F (36.3 C)  TempSrc:  Oral  Oral  SpO2: 93% 98%  97%  Weight:      Height:        Intake/Output Summary (Last 24 hours) at 03/08/17 1333 Last data filed at 03/07/17 1500  Gross per 24 hour  Intake              120 ml  Output                0 ml  Net              120 ml   Filed Weights   03/06/17 2126 03/07/17 0227  Weight: 49.9 kg (110 lb) 49.7 kg (109 lb 8 oz)    Examination:  General exam: not in pain or dyspnea E ENT. No pallor or icterus, oral mucosa moist.  Respiratory system: No wheezing or rhonchi, no rales. Respiratory effort normal. Cardiovascular system: S1 & S2 heard, RRR. No JVD, murmurs, rubs, gallops or  clicks. No pedal edema. Gastrointestinal system: Abdomen is nondistended, soft and nontender. No organomegaly or masses felt. Normal bowel sounds heard. Central nervous system: Alert and oriented. No focal neurological deficits. Extremities: Symmetric 5 x 5 power. Skin: No rashes, lesions or ulcers      Data Reviewed: I have personally reviewed following labs and imaging studies  CBC:  Recent Labs Lab 03/06/17 2242 03/07/17 0252 03/08/17 0233  WBC 5.3 5.6 6.0  NEUTROABS 3.2  --   --   HGB 14.9 14.8 13.5  HCT 45.1 44.5 41.4  MCV 91.5 90.6 91.6    PLT 175 161 283   Basic Metabolic Panel:  Recent Labs Lab 03/06/17 2242 03/07/17 0252  NA 137 141  K 4.2 3.7  CL 104 111  CO2 23 23  GLUCOSE 146* 118*  BUN 16 10  CREATININE 0.68 0.57  CALCIUM 9.4 8.9  MG 1.9  --    GFR: Estimated Creatinine Clearance: 38.9 mL/min (by C-G formula based on SCr of 0.57 mg/dL). Liver Function Tests:  Recent Labs Lab 03/06/17 2242  AST 29  ALT 16  ALKPHOS 63  BILITOT 0.7  PROT 7.1  ALBUMIN 4.2   No results for input(s): LIPASE, AMYLASE in the last 168 hours. No results for input(s): AMMONIA in the last 168 hours. Coagulation Profile:  Recent Labs Lab 03/06/17 2242  INR 1.09   Cardiac Enzymes:  Recent Labs Lab 03/07/17 0252  TROPONINI 0.10*   BNP (last 3 results) No results for input(s): PROBNP in the last 8760 hours. HbA1C:  Recent Labs  03/07/17 0252  HGBA1C 5.0   CBG: No results for input(s): GLUCAP in the last 168 hours. Lipid Profile:  Recent Labs  03/07/17 0252  CHOL 175  HDL 76  LDLCALC 91  TRIG 41  CHOLHDL 2.3   Thyroid Function Tests:  Recent Labs  03/07/17 0252  TSH 3.043   Anemia Panel: No results for input(s): VITAMINB12, FOLATE, FERRITIN, TIBC, IRON, RETICCTPCT in the last 72 hours. Sepsis Labs: No results for input(s): PROCALCITON, LATICACIDVEN in the last 168 hours.  No results found for this or any previous visit (from the past 240 hour(s)).       Radiology Studies: Dg Chest 2 View  Result Date: 03/07/2017 CLINICAL DATA:  Acute onset of generalized weakness and dizziness. Near syncope. Initial encounter. EXAM: CHEST  2 VIEW COMPARISON:  Chest radiograph performed 05/14/2016 FINDINGS: The lungs are well-aerated and clear. There is no evidence of focal opacification, pleural effusion or pneumothorax. The heart is borderline enlarged. No acute osseous abnormalities are seen. IMPRESSION: Borderline cardiomegaly.  Lungs remain grossly clear. Electronically Signed   By: Garald Balding  M.D.   On: 03/07/2017 00:11   Ct Head Wo Contrast  Result Date: 03/07/2017 CLINICAL DATA:  Acute onset of generalized weakness and dizziness. Near syncope. Initial encounter. EXAM: CT HEAD WITHOUT CONTRAST TECHNIQUE: Contiguous axial images were obtained from the base of the skull through the vertex without intravenous contrast. COMPARISON:  CT of the head performed 09/26/2008, and MRI of the brain performed 12/15/2015 FINDINGS: Brain: No evidence of acute infarction, hemorrhage, hydrocephalus, extra-axial collection or mass lesion/mass effect. Prominence of the ventricles and sulci reflects mild cortical volume loss. Mild cerebellar atrophy is noted. Mild subcortical and periventricular matter change likely reflects small vessel ischemic microangiopathy. The brainstem and fourth ventricle are within normal limits. The basal ganglia are unremarkable in appearance. The cerebral hemispheres demonstrate grossly normal gray-white differentiation. No mass effect or midline shift  is seen. Vascular: No hyperdense vessel or unexpected calcification. Skull: There is no evidence of fracture; visualized osseous structures are unremarkable in appearance. Sinuses/Orbits: The orbits are within normal limits. The paranasal sinuses and mastoid air cells are well-aerated. Other: No significant soft tissue abnormalities are seen. IMPRESSION: 1. No acute intracranial pathology seen on CT. 2. Mild cortical volume loss and scattered small vessel ischemic microangiopathy. Electronically Signed   By: Garald Balding M.D.   On: 03/07/2017 00:13   Mr Brain Wo Contrast  Result Date: 03/07/2017 CLINICAL DATA:  83 year old female with episodes of dizziness, blurred vision, nausea and transient left extremity weakness for 2 days. EXAM: MRI HEAD WITHOUT CONTRAST TECHNIQUE: Multiplanar, multiecho pulse sequences of the brain and surrounding structures were obtained without intravenous contrast. COMPARISON:  Head CT without contrast 0004  hours today. Brain MRI 12/15/2015. FINDINGS: Brain: 3 subcentimeter foci of restricted diffusion are identified in the left posterior fossa. Small 3-4 mm foci of restricted diffusion are noted in the central left cerebellum and the central left pons, with a larger 7-8 mm restricted focus along the superior left cerebellar folia (series 3, image 19). Mild associated T2 and FLAIR hyperintensity. No associated hemorrhage or mass effect. No other restricted diffusion. No midline shift, mass effect, evidence of mass lesion, ventriculomegaly, extra-axial collection or acute intracranial hemorrhage. Cervicomedullary junction and pituitary are within normal limits. No chronic cerebral blood products are identified. Minimal to mild for age scattered small cerebral white matter T2 and FLAIR hyperintense foci are stable since 2017. There is an unchanged subtle chronic lacunar infarct in the inferior right cerebellum (series 5, image 14). No cortical encephalomalacia identified. Vascular: Major intracranial vascular flow voids are stable since 2017 and appear normal. Skull and upper cervical spine: Negative. Visualized bone marrow signal is within normal limits. Sinuses/Orbits: Postoperative changes to the globes, otherwise normal orbit soft tissues. Visualized paranasal sinuses and mastoids are stable and well pneumatized. Other: Negative scalp soft tissues. IMPRESSION: 1. Several small acute lacunar type infarcts in the left cerebellum and pons. These are in the basilar perforator artery territory, the left SCA and AICA territories. 2. No associated hemorrhage or mass effect. 3. Otherwise stable since 2017 and largely unremarkable for age noncontrast MRI appearance of the brain. Electronically Signed   By: Genevie Ann M.D.   On: 03/07/2017 10:49        Scheduled Meds: . aspirin  300 mg Rectal Daily   Or  . aspirin  325 mg Oral Daily  . metoprolol tartrate  37.5 mg Oral BID   Continuous Infusions:   LOS: 1 day       Celvin Taney Gerome Apley, MD [facility] Pager [phone]  If 7PM-7AM, please contact night-coverage [email or web address] Password [email or web address] 03/08/2017, 1:33 PM

## 2017-03-08 NOTE — Progress Notes (Signed)
SUBJECTIVE: The patient is doing well today. She states "I feel better than Ive felt in a long time".  At this time, she denies chest pain, shortness of breath, or any new concerns.  [name] aspirin  300 mg Rectal Daily   Or  . aspirin  325 mg Oral Daily  . metoprolol tartrate  37.5 mg Oral BID     OBJECTIVE: Physical Exam: Vitals:   03/08/17 0113 03/08/17 0356 03/08/17 0704 03/08/17 1251  BP: (!) 166/88 (!) 113/57 123/78 (!) 147/73  Pulse: 63 63 (!) 57 76  Resp:  20  (!) 21  Temp:  97.4 F (36.3 C)  97.3 F (36.3 C)  TempSrc:  Oral  Oral  SpO2: 93% 98%  97%  Weight:      Height:        Intake/Output Summary (Last 24 hours) at 03/08/17 1302 Last data filed at 03/07/17 1500  Gross per 24 hour  Intake              120 ml  Output                0 ml  Net              120 ml    Telemetry reveals rate controlled afib  GEN- The patient is elderly appearing, alert and oriented x 3 today.   Head- normocephalic, atraumatic Eyes-  Sclera clear, conjunctiva pink Ears- hearing intact Oropharynx- clear Neck- supple,   Lungs- Clear to ausculation bilaterally, normal work of breathing Heart- irregular rate and rhythm,  GI- soft, NT, ND, + BS Extremities- no clubbing, cyanosis, or edema Skin- no rash or lesion Psych- euthymic mood, full affect  LABS: Basic Metabolic Panel:  Recent Labs  03/06/17 2242 03/07/17 0252  NA 137 141  K 4.2 3.7  CL 104 111  CO2 23 23  GLUCOSE 146* 118*  BUN 16 10  CREATININE 0.68 0.57  CALCIUM 9.4 8.9  MG 1.9  --    Liver Function Tests:  Recent Labs  03/06/17 2242  AST 29  ALT 16  ALKPHOS 63  BILITOT 0.7  PROT 7.1  ALBUMIN 4.2   No results for input(s): LIPASE, AMYLASE in the last 72 hours. CBC:  Recent Labs  03/06/17 2242 03/07/17 0252 03/08/17 0233  WBC 5.3 5.6 6.0  NEUTROABS 3.2  --   --   HGB 14.9 14.8 13.5  HCT 45.1 44.5 41.4  MCV 91.5 90.6 91.6  PLT 175 161 166   Cardiac Enzymes:  Recent Labs   03/07/17 0252  TROPONINI 0.10*   BNP: Invalid input(s): POCBNP D-Dimer: No results for input(s): DDIMER in the last 72 hours. Hemoglobin A1C:  Recent Labs  03/07/17 0252  HGBA1C 5.0   Fasting Lipid Panel:  Recent Labs  03/07/17 0252  CHOL 175  HDL 76  LDLCALC 91  TRIG 41  CHOLHDL 2.3   Thyroid Function Tests:  Recent Labs  03/07/17 0252  TSH 3.043    ASSESSMENT AND PLAN:   1. Tachy/ brady syndrome Doing well currently with metoprolol Will convert to metoprolol 37.5mg  BID today She is clear in her decision to avoid pacing.  Her rhythm is currently stable. As her pauses were during sinus rhythm, they may be unlikely to return if she remains in afib going forward.  Will therefore plan to not pursue sinus rhythm but rather continue gentle rate control as our strategy going forward. If V rates remain stable, may be able  to discharge tomorrow.  2. Persistent afib Awaiting initiation of eliquis She is now in agreement to try it.  Per neurology, would start eliquis 3 days after her stroke. Stop asa when starting on eliquis  3. CVA Per neurology  Could possibly discharge tomorrow from EP standpoint if rhythm remains stable.  Will defer to primary team.  Thompson Grayer MD, [facility] 03/08/2017 1:05 PM

## 2017-03-08 NOTE — Progress Notes (Signed)
STROKE TEAM PROGRESS NOTE   HISTORY OF PRESENT ILLNESS (per record) Kaitlin [name] is a very active 83 y.o. female with a history of subtherapeutic lacunar infarcts and atrial fibrillation not on anticoagulation presented with 2x30-45 minute transient episodes of left leg>>arm weakness (22May, 24May). The first of these episodes was accompanied by blurred vision. Kaitlin [name] stated that she was "shaky" during and after both episodes, and attributes this to nervousness. She has been admitted to [facility] due to a bump in troponin on arrival and for a TIA workup.  On my visit, Kaitlin [name] was sitting comfortably in the bedside chair. She endorses no current weakness, headaches, dizziness or loss of hearing or vision during either event. She notes peripheral neuropathy (numbness, tingling) to the knee bilaterally, worst in the feet. She does not seem to be concerned about the possibility of a TIA, but ruminates on the potential for a PPM.   SUBJECTIVE (INTERVAL HISTORY) Patient feels well today. She has afib and is not on ASA or anticoagulation. I discussed Eliquis with her today. She will discuss with her son.   OBJECTIVE Temp:  [97.4 F (36.3 C)-97.5 F (36.4 C)] 97.4 F (36.3 C) (05/26 0356) Pulse Rate:  [57-93] 57 (05/26 0704) Cardiac Rhythm: Atrial fibrillation (05/26 0704) Resp:  [18-20] 20 (05/26 0356) BP: (113-166)/(57-88) 123/78 (05/26 0704) SpO2:  [93 %-99 %] 98 % (05/26 0356)  CBC:  Recent Labs Lab 03/06/17 2242 03/07/17 0252 03/08/17 0233  WBC 5.3 5.6 6.0  NEUTROABS 3.2  --   --   HGB 14.9 14.8 13.5  HCT 45.1 44.5 41.4  MCV 91.5 90.6 91.6  PLT 175 161 419    Basic Metabolic Panel:  Recent Labs Lab 03/06/17 2242 03/07/17 0252  NA 137 141  K 4.2 3.7  CL 104 111  CO2 23 23  GLUCOSE 146* 118*  BUN 16 10  CREATININE 0.68 0.57  CALCIUM 9.4 8.9  MG 1.9  --     Lipid Panel:    Component Value Date/Time   CHOL 175 03/07/2017 0252   TRIG 41 03/07/2017 0252    HDL 76 03/07/2017 0252   CHOLHDL 2.3 03/07/2017 0252   VLDL 8 03/07/2017 0252   LDLCALC 91 03/07/2017 0252   HgbA1c:  Lab Results  Component Value Date   HGBA1C 5.0 03/07/2017   Urine Drug Screen:    Component Value Date/Time   LABOPIA NONE DETECTED 03/06/2017 2242   COCAINSCRNUR NONE DETECTED 03/06/2017 2242   LABBENZ NONE DETECTED 03/06/2017 2242   AMPHETMU NONE DETECTED 03/06/2017 2242   THCU NONE DETECTED 03/06/2017 2242   LABBARB NONE DETECTED 03/06/2017 2242    Alcohol Level     Component Value Date/Time   ETH <5 03/07/2017 0252    IMAGING  Dg Chest 2 View 03/07/2017 Borderline cardiomegaly.  Lungs remain grossly clear.   Ct Head Wo Contrast 03/07/2017 1. No acute intracranial pathology seen on CT.  2. Mild cortical volume loss and scattered small vessel ischemic microangiopathy.     Mr Brain Wo Contrast 03/07/2017 1. Several small acute lacunar type infarcts in the left cerebellum and pons. These are in the basilar perforator artery territory, the left SCA and AICA territories.  2. No associated hemorrhage or mass effect.  3. Otherwise stable since 2017 and largely unremarkable for age noncontrast MRI appearance of the brain.    Physical exam: Exam: Gen: NAD, conversant  CV: RRR, no MRG. No Carotid Bruits. No peripheral edema, warm, nontender Eyes: Conjunctivae clear without exudates or hemorrhage  Neuro: Detailed Neurologic Exam  Speech:    Speech is normal; fluent and spontaneous with normal comprehension.  Cognition:    The patient is oriented to person, place, and time; Follows commands, good historian    Cranial Nerves:    The pupils are equal, round, and reactive to light. The fundi are normal and spontaneous venous pulsations are present. Visual fields are full to finger confrontation. Extraocular movements are intact. Trigeminal sensation is intact and the muscles of mastication are normal. The face is symmetric. The palate elevates  in the midline. Hearing intact. Voice is normal. Shoulder shrug is normal. The tongue has normal motion without fasciculations.   Coordination:    Normal finger to nose and heel to shin.   Motor Observation:    no involuntary movements noted. Tone:    Normal muscle tone.    Posture:    Posture is normal. normal erect    Strength:    Strength is V/V in the upper and lower limbs.      Sensation: intact to LT     Reflex Exam:  DTR's:    Deep tendon reflexes in the upper and lower extremities are symmetrical bilaterally.   Toes:    The toes are downgoing bilaterally.   Clonus:    Clonus is absent.     ASSESSMENT/PLAN Kaitlin [name] is a 83 y.o. female with history of tachybradycardia syndrome, peripheral neuropathy, previous infarcts, atrial fibrillation not anticoagulated, coronary artery disease with previous MI, anxiety and depression presenting with transient episodes of left arm and leg weakness.  She did not receive IV t-PA due to late presentation.  Strokes:  acute lacunar type infarcts in the left cerebellum and pons - probably embolic from atrial fibrillation.  Resultant  symtoms resolved  CT head - no acute abnormality  MRI head - Several small acute lacunar type infarcts in the left cerebellum and pons.  MRA head - not performed  Carotid Doppler - Bilateral:  1-39% ICA stenosis.  Vertebral artery flow is antegrade.   2D Echo - EF 65-70%. No cardiac source of emboli identified.  LDL - 91  HgbA1c - 5.0  VTE prophylaxis - none  Diet regular Room service appropriate? Yes; Fluid consistency: Thin  No antithrombotic prior to admission, now on aspirin 325 mg daily   I recommend Eliquis for patient, discussed with her today, risk of bleeding but untreated afib puts her at increased risk of strokes.Her son will be at [facility] tomorrow and we can discuss further.  Patient counseled to be compliant with her antithrombotic medications  Ongoing aggressive  stroke risk factor management  Therapy recommendations: No follow-up PT recommended. OT evaluation is pending.  Disposition: Pending  Hypertension  Stable  Permissive hypertension (OK if < 220/120) but gradually normalize in 5-7 days  Long-term BP goal normotensive  Hyperlipidemia  Home meds: No lipid lowering medications prior to admission.  LDL 91, goal < 70  Now on Lipitor 40 mg daily.  Continue statin at discharge    Other Stroke Risk Factors  Advanced age  Family hx stroke (brother)  Coronary artery disease  Atrial fibrillation without anticoagulation  Other Active Problems  Tachybradycardia syndrome - permanent pacemaker being considered.  Hospital day # 1  Patient with untreated afib and now embolic strokes. Recommend Eliquis.  Personally examined patient and images, and have participated in and made  any corrections needed to history, physical, neuro exam,assessment and plan as stated above.  I have personally obtained the history, evaluated lab date, reviewed imaging studies and agree with radiology interpretations.    Sarina Ill, MD Stroke Neurology  To contact Stroke Continuity provider, please refer to [email or web address] After hours, contact General Neurology

## 2017-03-08 NOTE — Progress Notes (Signed)
  Echocardiogram 2D Echocardiogram has been performed.  Kaitlin [name] 03/08/2017, 12:19 PM

## 2017-03-09 LAB — VAS US CAROTID
LCCAPDIAS: 11 cm/s
LEFT ECA DIAS: -5 cm/s
LEFT VERTEBRAL DIAS: -10 cm/s
LICAPDIAS: -14 cm/s
Left CCA dist dias: -10 cm/s
Left CCA dist sys: -49 cm/s
Left CCA prox sys: 51 cm/s
Left ICA prox sys: -63 cm/s
RCCAPDIAS: 15 cm/s
RIGHT ECA DIAS: -6 cm/s
RIGHT VERTEBRAL DIAS: 6 cm/s
Right CCA prox sys: 64 cm/s
Right cca dist sys: -73 cm/s

## 2017-03-09 LAB — CBC
HEMATOCRIT: 42.4 % (ref 36.0–46.0)
Hemoglobin: 14.3 g/dL (ref 12.0–15.0)
MCH: 30.6 pg (ref 26.0–34.0)
MCHC: 33.7 g/dL (ref 30.0–36.0)
MCV: 90.8 fL (ref 78.0–100.0)
Platelets: 182 10*3/uL (ref 150–400)
RBC: 4.67 MIL/uL (ref 3.87–5.11)
RDW: 13.7 % (ref 11.5–15.5)
WBC: 7.9 10*3/uL (ref 4.0–10.5)

## 2017-03-09 MED ORDER — APIXABAN 2.5 MG PO TABS: 2.5000 mg | ORAL_TABLET | Freq: Two times a day (BID) | ORAL | Status: DC

## 2017-03-09 MED ORDER — METOPROLOL TARTRATE 25 MG PO TABS: 25.0000 mg | ORAL_TABLET | Freq: Two times a day (BID) | ORAL | 0 refills | Status: DC

## 2017-03-09 MED ORDER — METOPROLOL TARTRATE 25 MG PO TABS: 25.0000 mg | ORAL_TABLET | Freq: Two times a day (BID) | ORAL | Status: DC

## 2017-03-09 MED ORDER — APIXABAN 2.5 MG PO TABS: 2.5000 mg | ORAL_TABLET | Freq: Two times a day (BID) | ORAL | 0 refills | Status: DC

## 2017-03-09 NOTE — Progress Notes (Signed)
SUBJECTIVE: The patient is doing well today.  At this time, she denies chest pain, shortness of breath, or any new concerns.  [name] aspirin  300 mg Rectal Daily   Or  . aspirin  325 mg Oral Daily  . atorvastatin  40 mg Oral q1800  . metoprolol tartrate  37.5 mg Oral BID     OBJECTIVE: Physical Exam: Vitals:   03/08/17 1251 03/08/17 2202 03/08/17 2333 03/09/17 0325  BP: (!) 147/73 (!) 168/82 (!) 160/87 136/73  Pulse: 76 77 61 (!) 57  Resp: (!) 21 18  18   Temp: 97.3 F (36.3 C) 97.5 F (36.4 C)  97.6 F (36.4 C)  TempSrc: Oral Oral  Oral  SpO2: 97% 99%  98%  Weight:      Height:        Intake/Output Summary (Last 24 hours) at 03/09/17 0830 Last data filed at 03/08/17 1836  Gross per 24 hour  Intake              240 ml  Output                0 ml  Net              240 ml    Telemetry reveals afib (rates well controlled), no pauses  GEN- The patient is elderly appearing, alert and oriented x 3 today.   Head- normocephalic, atraumatic Eyes-  Sclera clear, conjunctiva pink Ears- hearing intact Oropharynx- clear Neck- supple,  Lungs- Clear to ausculation bilaterally, normal work of breathing Heart- irregular rate and rhythm   GI- soft, NT, ND, + BS Extremities- no clubbing, cyanosis, or edema Skin- no rash or lesion Psych- euthymic mood, full affect  LABS: Basic Metabolic Panel:  Recent Labs  03/06/17 2242 03/07/17 0252  NA 137 141  K 4.2 3.7  CL 104 111  CO2 23 23  GLUCOSE 146* 118*  BUN 16 10  CREATININE 0.68 0.57  CALCIUM 9.4 8.9  MG 1.9  --    Liver Function Tests:  Recent Labs  03/06/17 2242  AST 29  ALT 16  ALKPHOS 63  BILITOT 0.7  PROT 7.1  ALBUMIN 4.2   No results for input(s): LIPASE, AMYLASE in the last 72 hours. CBC:  Recent Labs  03/06/17 2242  03/08/17 0233 03/09/17 0248  WBC 5.3  < > 6.0 7.9  NEUTROABS 3.2  --   --   --   HGB 14.9  < > 13.5 14.3  HCT 45.1  < > 41.4 42.4  MCV 91.5  < > 91.6 90.8  PLT 175  < > 166 182    < > = values in this interval not displayed. Cardiac Enzymes:  Recent Labs  03/07/17 0252  TROPONINI 0.10*   BNP: Invalid input(s): POCBNP D-Dimer: No results for input(s): DDIMER in the last 72 hours. Hemoglobin A1C:  Recent Labs  03/07/17 0252  HGBA1C 5.0   Fasting Lipid Panel:  Recent Labs  03/07/17 0252  CHOL 175  HDL 76  LDLCALC 91  TRIG 41  CHOLHDL 2.3   Thyroid Function Tests:  Recent Labs  03/07/17 0252  TSH 3.043   Echo is reviewed- EF preserved, moderate to severe TR with moderate RA enlargement  ASSESSMENT AND PLAN:   1. Persistent afib Doing well with metoprolol.  I suspect that she will remain in afib going forward. Rates are well controlled.  Per patient's request will try metoprolol 25mg  BID Neurology to assist with timing  of initiation of eliquis.  Would stop ASA when eliquis is started.  2. Tachy/brady syndrome I suspect that she will remain in afib going forward and thus the possibility of sinus pauses are reduced.  V rates are currently controlled. She requests reducing metoprolol to 25mg  BID. She is clear in her decision to avoid pacing.  3. S/p stroke Per neurology Start eliquis and stop ASA when able She is reluctant to take lipitor Will defer to primary team/ neurology  No further inpatient EP workup planned.  She can go home when ok with neurology.  Electrophysiology team to see as needed while here. Please call with questions.   Thompson Grayer, MD 03/09/2017 8:30 AM

## 2017-03-09 NOTE — Discharge Summary (Signed)
Physician Discharge Summary  Kaitlin [name] ZYS:[id number] DOB: May 17, 1927 DOA: 03/06/2017  PCP: [name], MD  Admit date: 03/06/2017 Discharge date: 03/09/2017  Admitted From:  Home  Disposition:  Home   Recommendations for Outpatient Follow-up:  1. Follow up with PCP in 1- week. 2. Patient has been placed on metoprolol for rate control. 3. Started on anticoagulation with apixaban, reduced dose due to age and body weight. 4. Patient refused statin therapy.   Home Health: No Equipment/Devices: No   Discharge Condition: Stable CODE STATUS: Full  Diet recommendation: Heart Healthy  Brief/Interim Summary: 83 yo female presents with dizziness. Patient known to have tachy-bradi syndrome, paroxysmal atrial fibrillation and MUGS. Symptoms present for the last 2 days, acute episodic dizziness, associated with headache, blurry vision and palpitations. Improved with rest. Occurred x2 times. Note exertional related. Patient reported transitory left leg weakness. On the initial physical examination, patient hemodynamically stable with blood pressure 143/88, HR 83, RR 24 with temperature 97. Oral mucosa moist, lungs clear to auscultation, Irregularity irregular S1 and S2. Abdomen soft and no lower extremity edema. Non focal. Sodium 137, potassium 4.2, chloride 104, bicarbonate 23, glucose 146, BUN 16, creatinine 0.68, magnesium 1.9, AST 29, ALT 16, troponin 0.07, white count 5.3, hemoglobin 14.9, hematocrit 45.1, platelets 175 INR 1.0, TSH is 3.0, urinalysis with 6-30 white cells. Head CT with mild cortical volume loss and scattered small vessel ischemic microangiopathy. Chest x-ray with signs of hyperinflation, rotated to the right side, no infiltrates. EKG with atrial fibrillation, left axis deviation, poor R-wave progression.  Patient was admitted to rule out decompensated arrhythmia.   1. Tachy- Brady syndrome, paroxysmal atrial fibrillation, sinus pauses when on sinus rhythm. Patient was  admitted to the medical unit with a remote telemetry, she remained in atrial fibrillation, she was seen by cardiology and electrophysiology. Initial recommendation for pacemaker, suspecting tachyrhythmia being the cause of her symptoms. Patient refused pacemaker implantation, patient was placed on low-dose beta blockade with metoprolol with good toleration. Patient has been placed on apixaban for anticoagulation. Close follow-up as an outpatient. It is presumed that patient will remain in atrial fibrillation.  2. Acute CVA, acute small lacunar infarcts in the left cerebellum and pons. Further workup included an MRI which showed several small acute lacunar type infarcts in the left cerebellum and pons. Patient was placed initially on aspirin and then transition apixaban. Ultrasonography of the carotids showed bilateral 1% to 39% ICA stenosis. Echocardiography with ejection fraction 60-70% in the left ventricle, no signs of embolic phenomena. Lipid profile with an LDL of 91, HDL 76, cholesterol 175, patient refused to take statin therapy. No follow-up with PT or OT recommended.  3. Hypertension. Blood pressure remained well-controlled, patient off antihypertensive agents.  4. Anxiety. Patient continue to alprazolam with no major complications.  5. Chronic back pain. Patient was continued on oxycodone with no major complications.     Discharge Diagnoses:  Principal Problem:   Tachy-brady syndrome ([place]) Active Problems:   ANXIETY DEPRESSION   AF (paroxysmal atrial fibrillation) (HCC)   Essential hypertension, benign   Left leg weakness   Atrial fibrillation with RVR (HCC)   Atrial fibrillation (HCC)   Cerebrovascular accident (CVA) ([place])    Discharge Instructions   Allergies as of 03/09/2017      Reactions   Dilaudid [hydromorphone Hcl] Other (See Comments)   Dropped heart rate really low   Codeine Nausea And Vomiting, Rash   Morphine And Related Nausea And Vomiting, Rash   Nizatidine  Other (See Comments)   Unknown reaction       Medication List    TAKE these medications   ALPRAZolam 0.25 MG tablet Commonly known as:  XANAX Take 0.125 mg by mouth 3 (three) times daily as needed for anxiety. For sleep   apixaban 2.5 MG Tabs tablet Commonly known as:  ELIQUIS Take 1 tablet (2.5 mg total) by mouth 2 (two) times daily.   cholecalciferol 1000 units tablet Commonly known as:  VITAMIN D Take 1,000 Units by mouth daily.   Fish Oil 1000 MG Caps Take 1,000 mg by mouth daily.   HYDROcodone-acetaminophen 5-325 MG tablet Commonly known as:  NORCO/VICODIN Take 1 tablet by mouth every 6 (six) hours as needed for moderate pain.   metoprolol tartrate 25 MG tablet Commonly known as:  LOPRESSOR Take 1 tablet (25 mg total) by mouth 2 (two) times daily.   nitroGLYCERIN 0.4 MG SL tablet Commonly known as:  NITROSTAT Place 1 tablet (0.4 mg total) under the tongue every 5 (five) minutes x 3 doses as needed for chest pain.   polyethylene glycol packet Commonly known as:  MIRALAX / GLYCOLAX Take 17 g by mouth daily as needed for mild constipation.   PROBIOTIC DAILY PO Take 1 capsule by mouth daily.   SUPER B COMPLEX Tabs Take 1 tablet by mouth daily.      Follow-up Information    Deboraha Sprang, MD Follow up on 03/21/2017.   Specialty:  Cardiology Why:  at 2:45PM  Contact information: [address] [place] [address] [phone]          Allergies  Allergen Reactions  . Dilaudid [Hydromorphone Hcl] Other (See Comments)    Dropped heart rate really low  . Codeine Nausea And Vomiting and Rash  . Morphine And Related Nausea And Vomiting and Rash  . Nizatidine Other (See Comments)    Unknown reaction     Consultations:  Cardiology  Electrophysiology  Neurology   Procedures/Studies: Dg Chest 2 View  Result Date: 03/07/2017 CLINICAL DATA:  Acute onset of generalized weakness and dizziness. Near syncope. Initial encounter. EXAM:  CHEST  2 VIEW COMPARISON:  Chest radiograph performed 05/14/2016 FINDINGS: The lungs are well-aerated and clear. There is no evidence of focal opacification, pleural effusion or pneumothorax. The heart is borderline enlarged. No acute osseous abnormalities are seen. IMPRESSION: Borderline cardiomegaly.  Lungs remain grossly clear. Electronically Signed   By: Garald Balding M.D.   On: 03/07/2017 00:11   Ct Head Wo Contrast  Result Date: 03/07/2017 CLINICAL DATA:  Acute onset of generalized weakness and dizziness. Near syncope. Initial encounter. EXAM: CT HEAD WITHOUT CONTRAST TECHNIQUE: Contiguous axial images were obtained from the base of the skull through the vertex without intravenous contrast. COMPARISON:  CT of the head performed 09/26/2008, and MRI of the brain performed 12/15/2015 FINDINGS: Brain: No evidence of acute infarction, hemorrhage, hydrocephalus, extra-axial collection or mass lesion/mass effect. Prominence of the ventricles and sulci reflects mild cortical volume loss. Mild cerebellar atrophy is noted. Mild subcortical and periventricular matter change likely reflects small vessel ischemic microangiopathy. The brainstem and fourth ventricle are within normal limits. The basal ganglia are unremarkable in appearance. The cerebral hemispheres demonstrate grossly normal gray-white differentiation. No mass effect or midline shift is seen. Vascular: No hyperdense vessel or unexpected calcification. Skull: There is no evidence of fracture; visualized osseous structures are unremarkable in appearance. Sinuses/Orbits: The orbits are within normal limits. The paranasal sinuses and mastoid air cells are  well-aerated. Other: No significant soft tissue abnormalities are seen. IMPRESSION: 1. No acute intracranial pathology seen on CT. 2. Mild cortical volume loss and scattered small vessel ischemic microangiopathy. Electronically Signed   By: Garald Balding M.D.   On: 03/07/2017 00:13   Mr Brain Wo  Contrast  Result Date: 03/07/2017 CLINICAL DATA:  83 year old female with episodes of dizziness, blurred vision, nausea and transient left extremity weakness for 2 days. EXAM: MRI HEAD WITHOUT CONTRAST TECHNIQUE: Multiplanar, multiecho pulse sequences of the brain and surrounding structures were obtained without intravenous contrast. COMPARISON:  Head CT without contrast 0004 hours today. Brain MRI 12/15/2015. FINDINGS: Brain: 3 subcentimeter foci of restricted diffusion are identified in the left posterior fossa. Small 3-4 mm foci of restricted diffusion are noted in the central left cerebellum and the central left pons, with a larger 7-8 mm restricted focus along the superior left cerebellar folia (series 3, image 19). Mild associated T2 and FLAIR hyperintensity. No associated hemorrhage or mass effect. No other restricted diffusion. No midline shift, mass effect, evidence of mass lesion, ventriculomegaly, extra-axial collection or acute intracranial hemorrhage. Cervicomedullary junction and pituitary are within normal limits. No chronic cerebral blood products are identified. Minimal to mild for age scattered small cerebral white matter T2 and FLAIR hyperintense foci are stable since 2017. There is an unchanged subtle chronic lacunar infarct in the inferior right cerebellum (series 5, image 14). No cortical encephalomalacia identified. Vascular: Major intracranial vascular flow voids are stable since 2017 and appear normal. Skull and upper cervical spine: Negative. Visualized bone marrow signal is within normal limits. Sinuses/Orbits: Postoperative changes to the globes, otherwise normal orbit soft tissues. Visualized paranasal sinuses and mastoids are stable and well pneumatized. Other: Negative scalp soft tissues. IMPRESSION: 1. Several small acute lacunar type infarcts in the left cerebellum and pons. These are in the basilar perforator artery territory, the left SCA and AICA territories. 2. No associated  hemorrhage or mass effect. 3. Otherwise stable since 2017 and largely unremarkable for age noncontrast MRI appearance of the brain. Electronically Signed   By: Genevie Ann M.D.   On: 03/07/2017 10:49       Subjective: Patient feeling well, no nausea or vomiting, tolerating po well. No dyspnea or chest pain.   Discharge Exam: Vitals:   03/09/17 0325 03/09/17 1211  BP: 136/73 (!) 144/67  Pulse: (!) 57 60  Resp: 18 17  Temp: 97.6 F (36.4 C) 98.5 F (36.9 C)   Vitals:   03/08/17 2202 03/08/17 2333 03/09/17 0325 03/09/17 1211  BP: (!) 168/82 (!) 160/87 136/73 (!) 144/67  Pulse: 77 61 (!) 57 60  Resp: 18  18 17   Temp: 97.5 F (36.4 C)  97.6 F (36.4 C) 98.5 F (36.9 C)  TempSrc: Oral  Oral Oral  SpO2: 99%  98% 98%  Weight:      Height:        General: Pt is alert, awake, not in acute distress\ E ENT. No pallor or icterus.  Cardiovascular: RRR, S1/S2 +, no rubs, no gallops Respiratory: CTA bilaterally, no wheezing, no rhonchi Abdominal: Soft, NT, ND, bowel sounds + Extremities: no edema, no cyanosis    The results of significant diagnostics from this hospitalization (including imaging, microbiology, ancillary and laboratory) are listed below for reference.     Microbiology: No results found for this or any previous visit (from the past 240 hour(s)).   Labs: BNP (last 3 results) No results for input(s): BNP in the last 8760 hours. Basic  Metabolic Panel:  Recent Labs Lab 03/06/17 2242 03/07/17 0252  NA 137 141  K 4.2 3.7  CL 104 111  CO2 23 23  GLUCOSE 146* 118*  BUN 16 10  CREATININE 0.68 0.57  CALCIUM 9.4 8.9  MG 1.9  --    Liver Function Tests:  Recent Labs Lab 03/06/17 2242  AST 29  ALT 16  ALKPHOS 63  BILITOT 0.7  PROT 7.1  ALBUMIN 4.2   No results for input(s): LIPASE, AMYLASE in the last 168 hours. No results for input(s): AMMONIA in the last 168 hours. CBC:  Recent Labs Lab 03/06/17 2242 03/07/17 0252 03/08/17 0233 03/09/17 0248   WBC 5.3 5.6 6.0 7.9  NEUTROABS 3.2  --   --   --   HGB 14.9 14.8 13.5 14.3  HCT 45.1 44.5 41.4 42.4  MCV 91.5 90.6 91.6 90.8  PLT 175 161 166 182   Cardiac Enzymes:  Recent Labs Lab 03/07/17 0252  TROPONINI 0.10*   BNP: Invalid input(s): POCBNP CBG: No results for input(s): GLUCAP in the last 168 hours. D-Dimer No results for input(s): DDIMER in the last 72 hours. Hgb A1c  Recent Labs  03/07/17 0252  HGBA1C 5.0   Lipid Profile  Recent Labs  03/07/17 0252  CHOL 175  HDL 76  LDLCALC 91  TRIG 41  CHOLHDL 2.3   Thyroid function studies  Recent Labs  03/07/17 0252  TSH 3.043   Anemia work up No results for input(s): VITAMINB12, FOLATE, FERRITIN, TIBC, IRON, RETICCTPCT in the last 72 hours. Urinalysis    Component Value Date/Time   COLORURINE STRAW (A) 03/06/2017 2242   APPEARANCEUR CLEAR 03/06/2017 2242   LABSPEC 1.004 (L) 03/06/2017 2242   PHURINE 7.0 03/06/2017 2242   GLUCOSEU NEGATIVE 03/06/2017 2242   HGBUR MODERATE (A) 03/06/2017 2242   BILIRUBINUR NEGATIVE 03/06/2017 2242   KETONESUR NEGATIVE 03/06/2017 2242   PROTEINUR NEGATIVE 03/06/2017 2242   UROBILINOGEN 0.2 01/25/2014 1657   NITRITE NEGATIVE 03/06/2017 2242   LEUKOCYTESUR MODERATE (A) 03/06/2017 2242   Sepsis Labs Invalid input(s): PROCALCITONIN,  WBC,  LACTICIDVEN Microbiology No results found for this or any previous visit (from the past 240 hour(s)).   Time coordinating discharge: 45 minutes  SIGNED:   Tawni Millers, MD  [facility] 03/09/2017, 1:38 PM Pager   If 7PM-7AM, please contact night-coverage [email or web address] Password [email or web address]
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
## 2017-03-09 NOTE — Progress Notes (Signed)
   Recommend anticoagulation with Eliquis for this patient with afib.  Workup complete. The stroke team will sign off at this time. Please call if we can be of further service.  Mikey Bussing PA-C [facility] Pager [phone] 03/09/2017, 8:45 AM

## 2017-03-19 ENCOUNTER — Telehealth: Payer: Self-pay | Admitting: Internal Medicine

## 2017-03-19 DIAGNOSIS — Z8673 Personal history of transient ischemic attack (TIA), and cerebral infarction without residual deficits: Secondary | ICD-10-CM | POA: Diagnosis not present

## 2017-03-19 DIAGNOSIS — I481 Persistent atrial fibrillation: Secondary | ICD-10-CM | POA: Diagnosis not present

## 2017-03-19 NOTE — Telephone Encounter (Deleted)
error 

## 2017-03-21 ENCOUNTER — Ambulatory Visit (INDEPENDENT_AMBULATORY_CARE_PROVIDER_SITE_OTHER): Payer: Medicare Other | Admitting: Internal Medicine

## 2017-03-21 VITALS — BP 126/64 | HR 70 | Ht 66.0 in | Wt 112.0 lb

## 2017-03-21 DIAGNOSIS — I495 Sick sinus syndrome: Secondary | ICD-10-CM | POA: Diagnosis not present

## 2017-03-21 DIAGNOSIS — I10 Essential (primary) hypertension: Secondary | ICD-10-CM

## 2017-03-21 DIAGNOSIS — I480 Paroxysmal atrial fibrillation: Secondary | ICD-10-CM
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
## 2017-03-21 NOTE — Patient Instructions (Signed)
Medication Instructions: - Your physician recommends that you continue on your current medications as directed. Please refer to the Current Medication list given to you today.  Labwork: - none ordered  Procedures/Testing: - none ordered  Follow-Up: - Your physician wants you to follow-up in: 6 months with Dr. [name]. You will receive a reminder letter in the mail two months in advance. If you don't receive a letter, please call our office to schedule the follow-up appointment.   Any Additional Special Instructions Will Be Listed Below (If Applicable).     If you need a refill on your cardiac medications before your next appointment, please call your pharmacy.

## 2017-03-27 ENCOUNTER — Telehealth: Payer: Self-pay | Admitting: Internal Medicine

## 2017-03-27 NOTE — Telephone Encounter (Signed)
Lm to call back ./cy 

## 2017-03-27 NOTE — Telephone Encounter (Signed)
New message    Pt daughter is calling about pt. She said her mother's condition has changed since being in the hospital. She's had diherra but she's not sure if it's from starting new meds. She has also been having neck and shoulder pain.   Pt c/o BP issue: STAT if pt c/o blurred vision, one-sided weakness or slurred speech  1. What are your last 5 BP readings? 113/46, 115/56  2. Are you having any other symptoms (ex. Dizziness, headache, blurred vision, passed out)? lightheaded  3. What is your BP issue? Pt daughter states pt bp is low.

## 2017-03-28 NOTE — Telephone Encounter (Signed)
Spoke with daughter a couple of times.  Pt has several complaints diarrhea,increased sob over the last couple of days,left hand and arm numbness,neck and shoulder pain with weakness also had episode of choking on water. Per daughter had  Not  Had  F/u with  Neuro since discharge. Discussed with Dr [name]  Per  Dr [name] pt needs to f/u with  PMD these symptoms do not sound cardiac related maybe  Needs to be enrolled in some type of rehab as well . Pt 's daughter aware ./cy

## 2017-03-31 ENCOUNTER — Telehealth: Payer: Self-pay | Admitting: Neurology

## 2017-03-31 NOTE — Telephone Encounter (Signed)
Pt's daughter Peter Congo called back. She is not able to get transportation for the pt on Wed 6/20. Is there a work in [place] for 6/25,6/26 or 6/28??

## 2017-03-31 NOTE — Telephone Encounter (Addendum)
Pt's daughter said she was admitted to [facility] 03/06/17, pt was referred to PCP and cardiologist. An appt has been scheduled for Wednesday 6/20 with Dr Jaynee Eagles. Daughter said pt had 3 strokes and is in AFIB. Peter Congo is going out of town on 6/20, she would like to accompany her mother if there is any possible way to get her seen sooner. Please call

## 2017-04-02 ENCOUNTER — Telehealth: Payer: Self-pay | Admitting: Neurology

## 2017-04-02 ENCOUNTER — Ambulatory Visit (INDEPENDENT_AMBULATORY_CARE_PROVIDER_SITE_OTHER): Payer: Medicare Other | Admitting: Neurology

## 2017-04-02 ENCOUNTER — Encounter: Payer: Self-pay | Admitting: Neurology

## 2017-04-02 VITALS — BP 141/78 | HR 62 | Ht 66.0 in | Wt 111.0 lb

## 2017-04-02 DIAGNOSIS — G629 Polyneuropathy, unspecified: Secondary | ICD-10-CM | POA: Diagnosis not present

## 2017-04-02 DIAGNOSIS — R2689 Other abnormalities of gait and mobility: Secondary | ICD-10-CM | POA: Diagnosis not present

## 2017-04-02 DIAGNOSIS — R29898 Other symptoms and signs involving the musculoskeletal system: Secondary | ICD-10-CM | POA: Diagnosis not present

## 2017-04-02 DIAGNOSIS — R1310 Dysphagia, unspecified: Secondary | ICD-10-CM | POA: Diagnosis not present

## 2017-04-02 DIAGNOSIS — Z9181 History of falling: Secondary | ICD-10-CM | POA: Diagnosis not present
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
## 2017-04-02 NOTE — Telephone Encounter (Signed)
Returned call to pt's daughter, questions answered about today's neurology appt including orders for PT, speech and swallowing study. Reviewed stable VS from this afternoon's visit. She mentioned that pt was also experiencing fatigue, neck and shoulder pain. Discussed that these symptoms could be r/t a fib, med side effects, previous stroke and/or arthritis. Pt is on low dose metoprolol which needs to be managed by cardiologist. Dtr agreed to call 911 for any choking w/ breathing difficulty or s/s of stroke. She has a 3 mo f/u appt scheduled in Sept.

## 2017-04-02 NOTE — Patient Instructions (Signed)
Remember to drink plenty of fluid, eat healthy meals and do not skip any meals. Try to eat protein with a every meal and eat a healthy snack such as fruit or nuts in between meals. Try to keep a regular sleep-wake schedule and try to exercise daily, particularly in the form of walking, 20-30 minutes a day, if you can.   As far as your medications are concerned, I would like to suggest: Continue Eliquis  As far as diagnostic testing: Physical Therapy, Speech and Swallow  I would like to see you back in 3 months, sooner if we need to. Please call us with any interim questions, concerns, problems, updates or refill requests. .   Our phone number is [phone]. We also have an after hours call service for urgent matters and there is a physician on-call for urgent questions. For any emergencies you know to call 911 or go to the nearest emergency room  Stroke Prevention Some medical conditions and behaviors are associated with an increased chance of having a stroke. You may prevent a stroke by making healthy choices and managing medical conditions. How can I reduce my risk of having a stroke?  Stay physically active. Get at least 30 minutes of activity on most or all days.  Do not smoke. It may also be helpful to avoid exposure to secondhand smoke.  Limit alcohol use. Moderate alcohol use is considered to be: ? No more than 2 drinks per day for men. ? No more than 1 drink per day for nonpregnant women.  Eat healthy foods. This involves: ? Eating 5 or more servings of fruits and vegetables a day. ? Making dietary changes that address high blood pressure (hypertension), high cholesterol, diabetes, or obesity.  Manage your cholesterol levels. ? Making food choices that are high in fiber and low in saturated fat, trans fat, and cholesterol may control cholesterol levels. ? Take any prescribed medicines to control cholesterol as directed by your health care provider.  Manage your  diabetes. ? Controlling your carbohydrate and sugar intake is recommended to manage diabetes. ? Take any prescribed medicines to control diabetes as directed by your health care provider.  Control your hypertension. ? Making food choices that are low in salt (sodium), saturated fat, trans fat, and cholesterol is recommended to manage hypertension. ? Ask your health care provider if you need treatment to lower your blood pressure. Take any prescribed medicines to control hypertension as directed by your health care provider. ? If you are 30-17 years of age, have your blood pressure checked every 3-5 years. If you are 8 years of age or older, have your blood pressure checked every year.  Maintain a healthy weight. ? Reducing calorie intake and making food choices that are low in sodium, saturated fat, trans fat, and cholesterol are recommended to manage weight.  Stop drug abuse.  Avoid taking birth control pills. ? Talk to your health care provider about the risks of taking birth control pills if you are over 33 years old, smoke, get migraines, or have ever had a blood clot.  Get evaluated for sleep disorders (sleep apnea). ? Talk to your health care provider about getting a sleep evaluation if you snore a lot or have excessive sleepiness.  Take medicines only as directed by your health care provider. ? For some people, aspirin or blood thinners (anticoagulants) are helpful in reducing the risk of forming abnormal blood clots that can lead to stroke. If you have the irregular heart rhythm  of atrial fibrillation, you should be on a blood thinner unless there is a good reason you cannot take them. ? Understand all your medicine instructions.  Make sure that other conditions (such as anemia or atherosclerosis) are addressed. Get help right away if:  You have sudden weakness or numbness of the face, arm, or leg, especially on one side of the body.  Your face or eyelid droops to one  side.  You have sudden confusion.  You have trouble speaking (aphasia) or understanding.  You have sudden trouble seeing in one or both eyes.  You have sudden trouble walking.  You have dizziness.  You have a loss of balance or coordination.  You have a sudden, severe headache with no known cause.  You have new chest pain or an irregular heartbeat. Any of these symptoms may represent a serious problem that is an emergency. Do not wait to see if the symptoms will go away. Get medical help at once. Call your local emergency services (911 in [place] Do not drive yourself to the hospital. This information is not intended to replace advice given to you by your health care provider. Make sure you discuss any questions you have with your health care provider. Document Released: 11/07/2004 Document Revised: 03/07/2016 Document Reviewed: 04/02/2013 Elsevier Interactive Patient Education  2017 [employer].

## 2017-04-02 NOTE — Telephone Encounter (Signed)
Patients daughter Peter Congo called office in reference to her mothers appointment today.  Patient forgot to discuss Dr. Jaynee Eagles about how did the hospital determined patient was in A-Fib 27 if the patient didn't wear a heart monitor for patient to had placed back on Metoprolol tartrate.  Please call

## 2017-04-03 LAB — SJOGREN'S SYNDROME ANTIBODS(SSA + SSB)
ENA SSA (RO) Ab: <0.2 AI (ref 0.0–0.9)
ENA SSB (LA) Ab: <0.2 AI (ref 0.0–0.9)

## 2017-04-04 ENCOUNTER — Ambulatory Visit (HOSPITAL_COMMUNITY): Payer: Medicare Other

## 2017-04-07 ENCOUNTER — Telehealth: Payer: Self-pay | Admitting: Internal Medicine

## 2017-04-07 ENCOUNTER — Encounter (HOSPITAL_COMMUNITY): Payer: Self-pay

## 2017-04-07 DIAGNOSIS — R001 Bradycardia, unspecified: Secondary | ICD-10-CM

## 2017-04-07 NOTE — Telephone Encounter (Signed)
Hank  With VS reported I would be inclined to stop metoprolol What do you think

## 2017-04-07 NOTE — Telephone Encounter (Signed)
Spoke with daughter Kaitlin [name] (on [place]) who is concerned about pt and her increased c/o SOB, lightheadedness and irregular heart beat.  Pt HR has been running between 50 to 70 bpm (per her report), BP has been as low as 90/49.  Pt can tell she is going in and out of At Fib.  She is taking metoprolol tartrate 25 mg BID and is on Eliquis.  Daughter is questioning if pt needs to wear a heart monitor to see if he HR is dropping too low causing her s/s.  She is also asking if she should have a pacemaker because pt has decided she will if she needs to.    She was seen in the hospital by Dr Rayann Heman in 02/2017 however saw Dr Caryl Comes in office for f/u.  She is Dr Thompson Caul patient. Please see the following from recent office visit with Dr Caryl Comes on 03/21/17: We also discussed at some length the issue related to the reversion to atrial fibrillation permanently and the implications for sinus node dysfunction and posttermination pauses. As Dr. Rayann Heman had explained, the fact that she is now in atrial fibrillation eliminate the concern of sinus node dysfunction and as such there is no indication for pacing. Her heart rates are well controlled based on home measurements (59--83) on the metoprolol dose that she is currently taking.  Daughter is aware I will send this information to the MDs involved and contact her with any further recommendations.  If pt is going to continue on Metoprolol tartrate 25 mg BID she will need an RX send to [place].

## 2017-04-07 NOTE — Telephone Encounter (Signed)
New message    Pt daughter is calling about pt. She states that pt can feel her heart going in and out of afib. She is asking for a call back from RN. She said Dr. Rayann Heman saw pt in hospital. She has a question about pt metoprolol. Please call.

## 2017-04-08 ENCOUNTER — Other Ambulatory Visit: Payer: Self-pay | Admitting: Interventional Cardiology

## 2017-04-08 NOTE — Telephone Encounter (Signed)
Per pt call wants to know why this office wont fill her Metoprolo??   Dr [name] took her off Metoprolol but Dr Rayann Heman put her back on it this yr.   Pt saw Dr Lequita Halt 03/21/17.

## 2017-04-08 NOTE — Telephone Encounter (Signed)
OK to refill metoprolol. 

## 2017-04-08 NOTE — Telephone Encounter (Signed)
Okay to refill this? Just wanted to be sure as it was ordered by Arrien, Jimmy Picket, MD. Please advise. Thanks, MI

## 2017-04-09 NOTE — Telephone Encounter (Signed)
I agree and perhaps a 24-48 hour monitor thereafter to see where we are.

## 2017-04-10 NOTE — Telephone Encounter (Signed)
Can we had her stop metoprolol and get a 48-hour Holter monitor in one week Please

## 2017-04-10 NOTE — Telephone Encounter (Signed)
I called and spoke with the patient's daughter (DPR) Kaitlin [name]. I advised her of Dr. Caryl Comes / Dr. Thompson Caul recommendations to stop metoprolol and wear a 48 hour holter monitor in about a week. We discussed the reason for stopping the med and waiting to have her wear the monitor. The patient's daughter was concerned as to why patient wore a heart monitor 5 years ago and was told at that time that she could not take metoprolol again due to pauses she was having and then Dr. Rayann Heman put her back on it and said she was in a a-fib permanently.  Per Kaitlin [name], the patient can feel when she goes in/ out of a-fib.  I advised that when patient's have pauses, we have to take in to consideration when the patient is having these and if they are on a beta blocker, we have to have the patient come off of this prior to determining if a pacemaker is needed.  Kaitlin [name] verbalizes understanding and states the patient should be fine with the monitor- she will call to advise her to stop metoprolol and to wear the monitor. I offered to call the patient and speak with her- per Kaitlin [name], this would be fine, but the patient will not be home until after 4:00 pm today. I advised I would try her after 4 pm.

## 2017-04-10 NOTE — Telephone Encounter (Signed)
I attempted to call the patient. I left a message on her preferred # to call.

## 2017-04-11 ENCOUNTER — Other Ambulatory Visit: Payer: Self-pay | Admitting: *Deleted

## 2017-04-11 NOTE — Telephone Encounter (Signed)
Follow up    Pt is returning call to [facility].

## 2017-04-11 NOTE — Telephone Encounter (Signed)
I called and spoke with the patient. I advised her of Dr. Caryl Comes and Dr. Thompson Caul recommendations to stop metoprolol and wear a 48 hour holter in 1 week. The patient is agreeable and verbalizes understanding.  Order for holter placed.

## 2017-04-14 ENCOUNTER — Ambulatory Visit (HOSPITAL_COMMUNITY)
Admission: RE | Admit: 2017-04-14 | Discharge: 2017-04-14 | Disposition: A | Discharge status: Home / Self Care | Payer: Medicare Other | Source: Ambulatory Visit | Attending: Neurology | Admitting: Neurology

## 2017-04-14 DIAGNOSIS — K219 Gastro-esophageal reflux disease without esophagitis: Secondary | ICD-10-CM | POA: Diagnosis not present

## 2017-04-14 DIAGNOSIS — R1310 Dysphagia, unspecified: Secondary | ICD-10-CM | POA: Diagnosis not present

## 2017-04-14 DIAGNOSIS — T17308A Unspecified foreign body in larynx causing other injury, initial encounter: Secondary | ICD-10-CM | POA: Diagnosis not present

## 2017-04-17 ENCOUNTER — Other Ambulatory Visit: Payer: Self-pay | Admitting: Internal Medicine

## 2017-04-28 ENCOUNTER — Ambulatory Visit (INDEPENDENT_AMBULATORY_CARE_PROVIDER_SITE_OTHER): Payer: Medicare Other

## 2017-04-28 DIAGNOSIS — R001 Bradycardia, unspecified: Secondary | ICD-10-CM | POA: Diagnosis not present

## 2017-05-06 ENCOUNTER — Telehealth: Payer: Self-pay | Admitting: Internal Medicine

## 2017-05-06 NOTE — Telephone Encounter (Signed)
New message ° ° ° °Pt is calling about her monitor results. Please call. °

## 2017-05-06 NOTE — Telephone Encounter (Signed)
Holter report in Dr. Olin Pia in-basket to review- will forward to MD.

## 2017-05-08 NOTE — Telephone Encounter (Signed)
I have notified the patient of her holter results. She has been off metoprolol about 5 weeks- she still remains SOB with moving from 1 room to the next.  She can typically tell when her heart goes in and out of rhythm, so she is aware she is still having some a-fib. Her concern is that her SOB is coming from her eliqius- I advised her that eliquis should not really cause this, but she has not had her CBC checked since May when she started the medication. I advised her I will need to review further with Dr. Caryl Comes and call her back.  She is agreeable.

## 2017-05-08 NOTE — Telephone Encounter (Signed)
Follow Up: Kaitlin [name] is calling to find out the results of the monitor , She has been off of her Heart  Medication(Metoprolol)  for about 5wks and was told that she should be off for a week , she is having some shortness of breath , not sure if it is coming from the Eliquis . Please call

## 2017-05-08 NOTE — Telephone Encounter (Signed)
I spoke with Dr. Caryl Comes- per his report, the patient's a-fib is mostly permanent now, therefore this should not really be contributing to her SOB.  He does feel that the patient needs to come in to see Dr. [name] or a PA/NP on his team ASAP.  I have called the patient and notified her of Dr. Olin Pia recommendations. She states that she feels like her SOB is worse today than it has been and she is unsure if it is due to anxiety or not. She has alprazolam at home- I have advised her to take this this evening to see if it helps, but not to drive after taking it. She is concerned about a PE, but she has not associated chest pain- I have advised her I cannot be sure of this, but I feel this is fairly unlikely. She is currently on eliquis 2.5 mg BID.  I have advised her I will have someone from our office call her tomorrow to get her scheduled ASAP for follow up. She is agreeable.  Will forward to triage as I am unsure who is on Dr. Thompson Caul team and he is out of the office next week to help in getting an appointment for follow up with the PA/ NP arranged.

## 2017-05-09 NOTE — Telephone Encounter (Signed)
Per pt feels better today appt made for Monday 05-12-17 at 2:00 pm with Truitt Merle np .Adonis Housekeeper

## 2017-05-12 ENCOUNTER — Encounter: Payer: Self-pay | Admitting: Nurse Practitioner

## 2017-05-12 ENCOUNTER — Ambulatory Visit (INDEPENDENT_AMBULATORY_CARE_PROVIDER_SITE_OTHER): Payer: Medicare Other | Admitting: Nurse Practitioner

## 2017-05-12 ENCOUNTER — Encounter (INDEPENDENT_AMBULATORY_CARE_PROVIDER_SITE_OTHER): Payer: Self-pay

## 2017-05-12 VITALS — BP 110/70 | HR 76 | Ht 66.0 in | Wt 108.8 lb

## 2017-05-12 DIAGNOSIS — I482 Chronic atrial fibrillation, unspecified: Secondary | ICD-10-CM

## 2017-05-12 DIAGNOSIS — R0600 Dyspnea, unspecified: Secondary | ICD-10-CM

## 2017-05-12 DIAGNOSIS — Z79899 Other long term (current) drug therapy: Secondary | ICD-10-CM | POA: Diagnosis not present

## 2017-05-12 NOTE — Patient Instructions (Addendum)
We will be checking the following labs today - BMET, CBC, BNP   Medication Instructions:    Continue with your current medicines.     Testing/Procedures To Be Arranged:  Event monitor  Follow-Up:   See Dr. Caryl Comes back once the event monitor is complete    Other Special Instructions:   N/A    If you need a refill on your cardiac medications before your next appointment, please call your pharmacy.   Call the [place] office at [phone] if you have any questions, problems or concerns.
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
## 2017-05-13 LAB — BASIC METABOLIC PANEL
BUN/Creatinine Ratio: 19 (ref 12–28)
BUN: 16 mg/dL (ref 8–27)
CO2: 27 mmol/L (ref 20–29)
Calcium: 9.6 mg/dL (ref 8.7–10.3)
Chloride: 100 mmol/L (ref 96–106)
Creatinine, Ser: 0.83 mg/dL (ref 0.57–1.00)
GFR calc Af Amer: 73 mL/min/{1.73_m2} (ref >59)
GFR calc non Af Amer: 64 mL/min/{1.73_m2} (ref >59)
Glucose: 95 mg/dL (ref 65–99)
Potassium: 4.5 mmol/L (ref 3.5–5.2)
Sodium: 140 mmol/L (ref 134–144)

## 2017-05-13 LAB — PRO B NATRIURETIC PEPTIDE: NT-Pro BNP: 1503 pg/mL — ABNORMAL HIGH (ref 0–738)

## 2017-05-13 LAB — CBC
Hematocrit: 44.8 % (ref 34.0–46.6)
Hemoglobin: 15.3 g/dL (ref 11.1–15.9)
MCH: 30 pg (ref 26.6–33.0)
MCHC: 34.2 g/dL (ref 31.5–35.7)
MCV: 88 fL (ref 79–97)
Platelets: 206 10*3/uL (ref 150–379)
RBC: 5.1 x10E6/uL (ref 3.77–5.28)
RDW: 13.8 % (ref 12.3–15.4)
WBC: 6.9 10*3/uL (ref 3.4–10.8)

## 2017-05-19 ENCOUNTER — Ambulatory Visit (INDEPENDENT_AMBULATORY_CARE_PROVIDER_SITE_OTHER): Payer: Medicare Other | Admitting: Orthopedic Surgery

## 2017-05-19 ENCOUNTER — Encounter (INDEPENDENT_AMBULATORY_CARE_PROVIDER_SITE_OTHER): Payer: Self-pay | Admitting: Orthopedic Surgery

## 2017-05-19 VITALS — Ht 66.0 in | Wt 108.0 lb

## 2017-05-19 DIAGNOSIS — M6702 Short Achilles tendon (acquired), left ankle: Secondary | ICD-10-CM

## 2017-05-19 DIAGNOSIS — M79671 Pain in right foot: Secondary | ICD-10-CM | POA: Diagnosis not present

## 2017-05-19 DIAGNOSIS — M6701 Short Achilles tendon (acquired), right ankle: Secondary | ICD-10-CM

## 2017-05-19 DIAGNOSIS — M79672 Pain in left foot: Secondary | ICD-10-CM | POA: Diagnosis not present
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
## 2017-05-23 ENCOUNTER — Ambulatory Visit (INDEPENDENT_AMBULATORY_CARE_PROVIDER_SITE_OTHER): Payer: Medicare Other

## 2017-05-23 DIAGNOSIS — R0600 Dyspnea, unspecified: Secondary | ICD-10-CM

## 2017-05-23 DIAGNOSIS — I482 Chronic atrial fibrillation: Secondary | ICD-10-CM | POA: Diagnosis not present

## 2017-05-23 DIAGNOSIS — Z79899 Other long term (current) drug therapy: Secondary | ICD-10-CM

## 2017-05-26 ENCOUNTER — Telehealth: Payer: Self-pay | Admitting: *Deleted

## 2017-05-26 NOTE — Telephone Encounter (Addendum)
[facility] faxed a report after placed pt on a cardiac monitor on Thursday 05/23/17 at 4:16 CT. Atrial Fibrillation (1 min) with PVC's . Spoke with pt this morning she states she is feeling fine. Pt also states that she has this arrhythmias for years and no one knows what to do about it.  Dr [name] DOD aware of monitor notification of sustain A-Fib for 1 minute, with  no action taken no  Recommendations.

## 2017-06-13 DIAGNOSIS — I889 Nonspecific lymphadenitis, unspecified: Secondary | ICD-10-CM | POA: Diagnosis not present

## 2017-06-13 DIAGNOSIS — F419 Anxiety disorder, unspecified: Secondary | ICD-10-CM | POA: Diagnosis not present

## 2017-06-13 DIAGNOSIS — D472 Monoclonal gammopathy: Secondary | ICD-10-CM | POA: Diagnosis not present

## 2017-06-19 ENCOUNTER — Telehealth: Payer: Self-pay | Admitting: Neurology

## 2017-06-19 NOTE — Telephone Encounter (Signed)
I spoke to pt and she stated she has been treated with doxycycline for a golf ball size mass on her r side. Which she stated has gone down but is having some feeling of fullness/ lump in her throat,  She can eat and drink but feels after 2 min or so has some watery froth come up.  She has some SOB, which she states is normal for her. She feels her energy level is down. I told her that she needs to contact her pcp, or the last MD that saw her to evaluate what is going on or she can go to ED, or urgent care for evaluation.  She had this back in 03/2017 when saw Dr Jaynee Eagles, but this resolved.  She is able to communicate and sounds anxious.  She states she will go to be evaluated and I will check on her tomorrow.

## 2017-06-19 NOTE — Telephone Encounter (Signed)
Pt called she said about 1 week ago she went to her PCP due to a gland in her throat was swollen. She was given doxycycline 100mg  qd. Since that time she is having difficulty keeping water down, it comes back up as little bubbles. She had rice today, it went down ok but then felt like it came back up as foam. An appt was made with Hoyle Sauer for Monoday 9/10. Pt said she is willing to ED if necessary, she needs guidance.

## 2017-06-20 NOTE — Telephone Encounter (Signed)
I called pt back and she still has the fullness in throat, c/o of chest fullness.  I told her that she needs to be evaluated if having chest fullness at ED.  She is SOB normally.   Is wearing a cardiac monitor at this time.  She stated she will go to her pcp and see about the throat fullness since just finished doxycyline for a mass in neck.  I cancelled the appt for Monday and rescheduled her back on the 07-03-17 at 1515 with CM/NP for 3 mo f/u.

## 2017-06-23 ENCOUNTER — Ambulatory Visit: Payer: Medicare Other | Admitting: Nurse Practitioner

## 2017-06-23 DIAGNOSIS — D472 Monoclonal gammopathy: Secondary | ICD-10-CM | POA: Diagnosis not present

## 2017-06-23 DIAGNOSIS — R509 Fever, unspecified: Secondary | ICD-10-CM | POA: Diagnosis not present

## 2017-06-23 DIAGNOSIS — R634 Abnormal weight loss: Secondary | ICD-10-CM | POA: Diagnosis not present

## 2017-06-23 DIAGNOSIS — I889 Nonspecific lymphadenitis, unspecified: Secondary | ICD-10-CM | POA: Diagnosis not present

## 2017-06-23 DIAGNOSIS — F419 Anxiety disorder, unspecified: Secondary | ICD-10-CM | POA: Diagnosis not present

## 2017-06-25 ENCOUNTER — Telehealth: Payer: Self-pay | Admitting: Hematology and Oncology

## 2017-06-25 ENCOUNTER — Encounter: Payer: Self-pay | Admitting: Hematology and Oncology

## 2017-06-25 NOTE — Telephone Encounter (Signed)
Appt has been scheduled with Tammy from [facility] for the pt to see Dr. Lindi Adie on 10/1 at [place] will notify the pt. Letter mailed to the pt.

## 2017-07-01 NOTE — Telephone Encounter (Signed)
Pt called in she rec'd a reminder call for the appt on 9/20. Pt said she was to be contacted to schedule this appt but no one did, she was not aware of it and already has an appt on that day. I told her it was made by RN on 9/7, but she said no one contacted her to make the appt.  FYI

## 2017-07-01 NOTE — Telephone Encounter (Signed)
Has appt 08/13/17 with CM/NP.

## 2017-07-01 NOTE — Telephone Encounter (Signed)
LMVM for pt that will call her back re: to appt.

## 2017-07-01 NOTE — Telephone Encounter (Signed)
Attempted to call again, LMVM for her to return call.

## 2017-07-02 NOTE — Telephone Encounter (Signed)
Pt has returned the call to [facility], I made her aware of the 10-31 appointment, she is still asking for a call back

## 2017-07-02 NOTE — Telephone Encounter (Signed)
I spoke to pt and she has appt 08-13-17 with CM/NP.  I apologized for confusion of appts that were scheduled previously.  She has appt with hematology about the lymph gland that she has on her neck.  She verbalized understanding and is ok with this.

## 2017-07-03 ENCOUNTER — Ambulatory Visit: Payer: Medicare Other | Admitting: Nurse Practitioner

## 2017-07-03 ENCOUNTER — Ambulatory Visit: Payer: Self-pay | Admitting: Nurse Practitioner

## 2017-07-08 ENCOUNTER — Ambulatory Visit (INDEPENDENT_AMBULATORY_CARE_PROVIDER_SITE_OTHER): Payer: Medicare Other | Admitting: Internal Medicine

## 2017-07-08 VITALS — BP 122/70 | HR 89 | Ht 65.0 in | Wt 108.0 lb

## 2017-07-08 DIAGNOSIS — I482 Chronic atrial fibrillation: Secondary | ICD-10-CM | POA: Diagnosis not present

## 2017-07-08 DIAGNOSIS — I4821 Permanent atrial fibrillation: Secondary | ICD-10-CM
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
## 2017-07-08 NOTE — Patient Instructions (Signed)

## 2017-07-14 ENCOUNTER — Ambulatory Visit (HOSPITAL_BASED_OUTPATIENT_CLINIC_OR_DEPARTMENT_OTHER): Payer: Medicare Other | Admitting: Hematology and Oncology

## 2017-07-14 ENCOUNTER — Ambulatory Visit: Payer: Medicare Other

## 2017-07-14 ENCOUNTER — Telehealth: Payer: Self-pay | Admitting: Hematology and Oncology

## 2017-07-14 DIAGNOSIS — R221 Localized swelling, mass and lump, neck: Secondary | ICD-10-CM

## 2017-07-14 DIAGNOSIS — I891 Lymphangitis: Secondary | ICD-10-CM

## 2017-07-14 DIAGNOSIS — R5383 Other fatigue: Secondary | ICD-10-CM | POA: Insufficient documentation

## 2017-07-14 DIAGNOSIS — R5382 Chronic fatigue, unspecified: Secondary | ICD-10-CM | POA: Diagnosis not present

## 2017-07-14 DIAGNOSIS — R591 Generalized enlarged lymph nodes: Secondary | ICD-10-CM | POA: Insufficient documentation

## 2017-07-14 DIAGNOSIS — R509 Fever, unspecified: Secondary | ICD-10-CM | POA: Diagnosis not present

## 2017-07-14 DIAGNOSIS — D8989 Other specified disorders involving the immune mechanism, not elsewhere classified: Secondary | ICD-10-CM | POA: Diagnosis present

## 2017-07-14 LAB — COMPREHENSIVE METABOLIC PANEL
ALT: 15 U/L (ref 0–55)
ANION GAP: 10 meq/L (ref 3–11)
AST: 23 U/L (ref 5–34)
Albumin: 4.4 g/dL (ref 3.5–5.0)
Alkaline Phosphatase: 72 U/L (ref 40–150)
BUN: 20 mg/dL (ref 7.0–26.0)
CHLORIDE: 103 meq/L (ref 98–109)
CO2: 27 meq/L (ref 22–29)
Calcium: 10.1 mg/dL (ref 8.4–10.4)
Creatinine: 0.8 mg/dL (ref 0.6–1.1)
EGFR: 66 mL/min/{1.73_m2} — AB (ref >90)
GLUCOSE: 93 mg/dL (ref 70–140)
POTASSIUM: 4.5 meq/L (ref 3.5–5.1)
Sodium: 140 mEq/L (ref 136–145)
Total Bilirubin: 0.51 mg/dL (ref 0.20–1.20)
Total Protein: 8 g/dL (ref 6.4–8.3)

## 2017-07-14 LAB — CBC WITH DIFFERENTIAL/PLATELET
BASO%: 0.3 % (ref 0.0–2.0)
Basophils Absolute: 0 10*3/uL (ref 0.0–0.1)
EOS%: 0.3 % (ref 0.0–7.0)
Eosinophils Absolute: 0 10*3/uL (ref 0.0–0.5)
HEMATOCRIT: 45.8 % (ref 34.8–46.6)
HEMOGLOBIN: 15.3 g/dL (ref 11.6–15.9)
LYMPH#: 1.1 10*3/uL (ref 0.9–3.3)
LYMPH%: 16.4 % (ref 14.0–49.7)
MCH: 30.8 pg (ref 25.1–34.0)
MCHC: 33.4 g/dL (ref 31.5–36.0)
MCV: 92.3 fL (ref 79.5–101.0)
MONO#: 0.5 10*3/uL (ref 0.1–0.9)
MONO%: 7.3 % (ref 0.0–14.0)
NEUT#: 4.9 10*3/uL (ref 1.5–6.5)
NEUT%: 75.7 % (ref 38.4–76.8)
Platelets: 198 10*3/uL (ref 145–400)
RBC: 4.96 10*6/uL (ref 3.70–5.45)
RDW: 14.2 % (ref 11.2–14.5)
WBC: 6.5 10*3/uL (ref 3.9–10.3)

## 2017-07-14 LAB — TSH: TSH: 2.699 m[IU]/L (ref 0.308–3.960)

## 2017-07-14 LAB — LACTATE DEHYDROGENASE: LDH: 169 U/L (ref 125–245)

## 2017-07-14 NOTE — Telephone Encounter (Signed)
Gave patient avs and calendar with appts per 10/1 los.  °

## 2017-07-14 NOTE — Assessment & Plan Note (Signed)
Right cervical lymphadenopathy : Improved with recent antibiotic use. Patient also had inguinal lymphadenopathy which waxes and wanes.  along with her symptoms of fevers of unknown origin ,there is concern for lymphoma   plan: 1.  CT neck chest abdomen and pelvis 2.  Workup from autoimmune disorders 3.  Will get CBC CMP and LDH 4.  Flow cytometry   Return to clinic in 2 weeks to discuss the results
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
## 2017-07-15 LAB — SJOGREN'S SYNDROME ANTIBODS(SSA + SSB): ENA SSA (RO) Ab: <0.2 AI (ref 0.0–0.9)

## 2017-07-15 LAB — ANTINUCLEAR ANTIBODIES, IFA: ANA Titer 1: NEGATIVE

## 2017-07-21 DIAGNOSIS — F430 Acute stress reaction: Secondary | ICD-10-CM | POA: Diagnosis not present

## 2017-07-21 DIAGNOSIS — J029 Acute pharyngitis, unspecified: Secondary | ICD-10-CM | POA: Diagnosis not present

## 2017-07-21 DIAGNOSIS — Z681 Body mass index (BMI) 19 or less, adult: Secondary | ICD-10-CM | POA: Diagnosis not present

## 2017-07-21 DIAGNOSIS — R636 Underweight: Secondary | ICD-10-CM | POA: Diagnosis not present

## 2017-07-21 DIAGNOSIS — F410 Panic disorder [episodic paroxysmal anxiety] without agoraphobia: Secondary | ICD-10-CM | POA: Diagnosis not present

## 2017-07-24 ENCOUNTER — Encounter (HOSPITAL_COMMUNITY): Payer: Self-pay

## 2017-07-24 ENCOUNTER — Ambulatory Visit (HOSPITAL_COMMUNITY)
Admission: RE | Admit: 2017-07-24 | Discharge: 2017-07-24 | Disposition: A | Discharge status: Home / Self Care | Payer: Medicare Other | Source: Ambulatory Visit | Attending: Hematology and Oncology | Admitting: Hematology and Oncology

## 2017-07-24 ENCOUNTER — Telehealth: Payer: Self-pay | Admitting: *Deleted

## 2017-07-24 DIAGNOSIS — R109 Unspecified abdominal pain: Secondary | ICD-10-CM | POA: Diagnosis not present

## 2017-07-24 DIAGNOSIS — R509 Fever, unspecified: Secondary | ICD-10-CM | POA: Diagnosis not present

## 2017-07-24 DIAGNOSIS — J479 Bronchiectasis, uncomplicated: Secondary | ICD-10-CM | POA: Insufficient documentation

## 2017-07-24 DIAGNOSIS — I891 Lymphangitis: Secondary | ICD-10-CM | POA: Diagnosis not present

## 2017-07-24 DIAGNOSIS — R911 Solitary pulmonary nodule: Secondary | ICD-10-CM | POA: Insufficient documentation

## 2017-07-24 DIAGNOSIS — R221 Localized swelling, mass and lump, neck: Secondary | ICD-10-CM

## 2017-07-24 DIAGNOSIS — R599 Enlarged lymph nodes, unspecified: Secondary | ICD-10-CM | POA: Diagnosis not present

## 2017-07-24 MED ORDER — IOPAMIDOL (ISOVUE-300) INJECTION 61%
INTRAVENOUS | Status: AC
  Administered 2017-07-24: 100 mL via INTRAVENOUS
  Filled 2017-07-24: qty 100

## 2017-07-24 MED ORDER — IOPAMIDOL (ISOVUE-300) INJECTION 61%
100.0000 mL | Freq: Once | INTRAVENOUS | Status: AC | PRN
  Administered 2017-07-24: 100 mL via INTRAVENOUS

## 2017-07-24 NOTE — Telephone Encounter (Signed)
"  Kaitlin [name] calling in reference to my mom's CT scan appointment today.  With the weather, are you all closed?"  No   "When is the next available appointment?"   Offered Radiology Central Scheduling number to reschedule before next scheduled F/U.   "She's 30 eight years old.  I need to call her to drink the contrast.  I will bring her in as scheduled.  Results are needed before the scheduled appointment on Monday October 15th."

## 2017-07-28 ENCOUNTER — Ambulatory Visit (HOSPITAL_BASED_OUTPATIENT_CLINIC_OR_DEPARTMENT_OTHER): Payer: Medicare Other | Admitting: Hematology and Oncology

## 2017-07-28 ENCOUNTER — Other Ambulatory Visit (HOSPITAL_COMMUNITY)
Admission: RE | Admit: 2017-07-28 | Discharge: 2017-07-28 | Disposition: A | Discharge status: Home / Self Care | Payer: Medicare Other | Source: Ambulatory Visit | Attending: Hematology and Oncology | Admitting: Hematology and Oncology

## 2017-07-28 ENCOUNTER — Other Ambulatory Visit (HOSPITAL_BASED_OUTPATIENT_CLINIC_OR_DEPARTMENT_OTHER): Payer: Medicare Other

## 2017-07-28 ENCOUNTER — Other Ambulatory Visit: Payer: Self-pay

## 2017-07-28 DIAGNOSIS — D8989 Other specified disorders involving the immune mechanism, not elsewhere classified: Secondary | ICD-10-CM | POA: Diagnosis present

## 2017-07-28 DIAGNOSIS — R591 Generalized enlarged lymph nodes: Secondary | ICD-10-CM | POA: Insufficient documentation

## 2017-07-28 DIAGNOSIS — R509 Fever, unspecified: Secondary | ICD-10-CM

## 2017-07-28 LAB — CBC WITH DIFFERENTIAL/PLATELET
BASO%: 0.6 % (ref 0.0–2.0)
Basophils Absolute: 0 10*3/uL (ref 0.0–0.1)
EOS ABS: 0 10*3/uL (ref 0.0–0.5)
EOS%: 0.6 % (ref 0.0–7.0)
HCT: 46.8 % — ABNORMAL HIGH (ref 34.8–46.6)
HEMOGLOBIN: 15.4 g/dL (ref 11.6–15.9)
LYMPH%: 24.6 % (ref 14.0–49.7)
MCH: 30.4 pg (ref 25.1–34.0)
MCHC: 33 g/dL (ref 31.5–36.0)
MCV: 92.3 fL (ref 79.5–101.0)
MONO#: 0.5 10*3/uL (ref 0.1–0.9)
MONO%: 8.2 % (ref 0.0–14.0)
NEUT%: 66 % (ref 38.4–76.8)
NEUTROS ABS: 3.8 10*3/uL (ref 1.5–6.5)
Platelets: 186 10*3/uL (ref 145–400)
RBC: 5.07 10*6/uL (ref 3.70–5.45)
RDW: 14.7 % — AB (ref 11.2–14.5)
WBC: 5.7 10*3/uL (ref 3.9–10.3)
lymph#: 1.4 10*3/uL (ref 0.9–3.3)

## 2017-07-28 LAB — COMPREHENSIVE METABOLIC PANEL
ALK PHOS: 64 U/L (ref 40–150)
ALT: 22 U/L (ref 0–55)
AST: 26 U/L (ref 5–34)
Albumin: 4.2 g/dL (ref 3.5–5.0)
Anion Gap: 8 mEq/L (ref 3–11)
BUN: 18.5 mg/dL (ref 7.0–26.0)
CO2: 27 mEq/L (ref 22–29)
CREATININE: 0.8 mg/dL (ref 0.6–1.1)
Calcium: 9.5 mg/dL (ref 8.4–10.4)
Chloride: 104 mEq/L (ref 98–109)
GLUCOSE: 81 mg/dL (ref 70–140)
Potassium: 4 mEq/L (ref 3.5–5.1)
SODIUM: 139 meq/L (ref 136–145)
TOTAL PROTEIN: 7.3 g/dL (ref 6.4–8.3)
Total Bilirubin: 0.72 mg/dL (ref 0.20–1.20)

## 2017-07-28 NOTE — Progress Notes (Signed)
Patient Care Team: [name], MD as PCP - General (Internal Medicine)  DIAGNOSIS:  Encounter Diagnosis  Name Primary?  . Lymphadenopathy     CHIEF COMPLIANT: follow-up to review the blood work and CT scans  INTERVAL HISTORY: Kaitlin [name] is a 83 year old lady with above-mentioned history of cervical lymphadenopathy who was also having some chills and weight loss issues along with low back pain. She was sent was for evaluation for lymphadenopathy with a concern of lymphoma. We performed CT of the neck chest abdomen pelvis along with blood work and is here today to discuss the results.The CT scan did not show any significant lymphadenopathy.  REVIEW OF SYSTEMS:   Constitutional: Denies fevers; complains of fatigue and chills Eyes: Denies blurriness of vision Ears, nose, mouth, throat, and face: Denies mucositis or sore throat Respiratory: Denies cough, dyspnea or wheezes Cardiovascular: Denies palpitation, chest discomfort Gastrointestinal:  Denies nausea, heartburn or change in bowel habits Skin: Denies abnormal skin rashes Lymphatics: Denies new lymphadenopathy or easy bruising Neurological:Denies numbness, tingling or new weaknesses Behavioral/Psych: Mood is stable, no new changes  Extremities: No lower extremity edema Breast:  denies any pain or lumps or nodules in either breasts All other systems were reviewed with the patient and are negative.  I have reviewed the past medical history, past surgical history, social history and family history with the patient and they are unchanged from previous note.  ALLERGIES:  is allergic to dilaudid [hydromorphone hcl]; doxycycline; codeine; morphine and related; and nizatidine.  MEDICATIONS:  Current Outpatient Prescriptions  Medication Sig Dispense Refill  . ALPRAZolam (XANAX) 0.25 MG tablet Take 0.125 mg by mouth 3 (three) times daily as needed for anxiety. For sleep    . apixaban (ELIQUIS) 2.5 MG TABS tablet Take 1 tablet  (2.5 mg total) by mouth 2 (two) times daily. 180 tablet 1  . B Complex-C (SUPER B COMPLEX) TABS Take 1 tablet by mouth daily.      . cholecalciferol (VITAMIN D) 1000 units tablet Take 1,000 Units by mouth daily.    [name] HYDROcodone-acetaminophen (NORCO/VICODIN) 5-325 MG per tablet Take 1 tablet by mouth every 6 (six) hours as needed for moderate pain.    . nitroGLYCERIN (NITROSTAT) 0.4 MG SL tablet Place 1 tablet (0.4 mg total) under the tongue every 5 (five) minutes x 3 doses as needed for chest pain. 25 tablet 3  . Omega-3 Fatty Acids (FISH OIL) 1000 MG CAPS Take 1,000 mg by mouth daily.     [name] OVER THE COUNTER MEDICATION     . polyethylene glycol (MIRALAX / GLYCOLAX) packet Take 17 g by mouth daily as needed for mild constipation.     . Probiotic Product (PROBIOTIC DAILY PO) Take 1 capsule by mouth daily.     No current facility-administered medications for this visit.     PHYSICAL EXAMINATION: ECOG PERFORMANCE STATUS: 1 - Symptomatic but completely ambulatory  Vitals:   07/28/17 1436  BP: (!) 141/81  Pulse: 77  Resp: 20  Temp: 98.2 F (36.8 C)  SpO2: 99%   Filed Weights   07/28/17 1436  Weight: 103 lb 14.4 oz (47.1 kg)    GENERAL:alert, no distress and comfortable SKIN: skin color, texture, turgor are normal, no rashes or significant lesions EYES: normal, Conjunctiva are pink and non-injected, sclera clear OROPHARYNX:no exudate, no erythema and lips, buccal mucosa, and tongue normal  NECK: supple, thyroid normal size, non-tender, without nodularity LYMPH:  no palpable lymphadenopathy in the cervical, axillary or inguinal LUNGS: clear  to auscultation and percussion with normal breathing effort HEART: regular rate & rhythm and no murmurs and no lower extremity edema ABDOMEN:abdomen soft, non-tender and normal bowel sounds MUSCULOSKELETAL:no cyanosis of digits and no clubbing  NEURO: alert & oriented x 3 with fluent speech, no focal motor/sensory deficits EXTREMITIES: No lower  extremity edema  LABORATORY DATA:  I have reviewed the data as listed   Chemistry      Component Value Date/Time   NA 139 07/28/2017 1417   K 4.0 07/28/2017 1417   CL 100 05/12/2017 1448   CO2 27 07/28/2017 1417   BUN 18.5 07/28/2017 1417   CREATININE 0.8 07/28/2017 1417      Component Value Date/Time   CALCIUM 9.5 07/28/2017 1417   ALKPHOS 64 07/28/2017 1417   AST 26 07/28/2017 1417   ALT 22 07/28/2017 1417   BILITOT 0.72 07/28/2017 1417       Lab Results  Component Value Date   WBC 5.7 07/28/2017   HGB 15.4 07/28/2017   HCT 46.8 (H) 07/28/2017   MCV 92.3 07/28/2017   PLT 186 07/28/2017   NEUTROABS 3.8 07/28/2017    ASSESSMENT & PLAN:  Lymphadenopathy Right cervical lymphadenopathy: Resolved with antibiotics CT neck chest abdomen pelvis 07/25/2017: 9 mm right upper lobe nodule, no lymphadenopathy nonspecific upper lobe groundglass opacity in the lung  Lab work review: CBC, CMP, TSH, LDH, ANA, SSA, SSB: Negative I relieved the previously noted cervical lymphadenopathy was infectious in etiology. It got better with antibiotic use. There is no other evidence of lymphoma.Autoimmune workup also came back negative.  Return to clinic on an as-needed basis.   I spent 25 minutes talking to the patient of which more than half was spent in counseling and coordination of care.  No orders of the defined types were placed in this encounter.  The patient has a good understanding of the overall plan. she agrees with it. she will call with any problems that may develop before the next visit here.   Rulon Eisenmenger, MD 07/28/17

## 2017-07-28 NOTE — Assessment & Plan Note (Signed)
Right cervical lymphadenopathy: Resolved with antibiotics CT neck chest abdomen pelvis 07/25/2017: 9 mm right upper lobe nodule, no lymphadenopathy nonspecific upper lobe groundglass opacity in the lung  Lab work review: CBC, CMP, LDH, ANA, SSA, SSB: Negative I relieved the previously noted cervical lymphadenopathy was infectious in etiology. It got better with antibiotic use. There is no other evidence of lymphoma.Autoimmune workup also came back negative.  Return to clinic on an as-needed basis.

## 2017-07-30 LAB — FLOW CYTOMETRY

## 2017-08-06 ENCOUNTER — Telehealth: Payer: Self-pay

## 2017-08-06 NOTE — Telephone Encounter (Signed)
Daughter called for lab results. Pt had surgical path and flow cytometry on 10/15.

## 2017-08-07 NOTE — Telephone Encounter (Signed)
Will print report for Dr.Gudena to review and we will contact the pt. Thank you.

## 2017-08-11 ENCOUNTER — Ambulatory Visit (INDEPENDENT_AMBULATORY_CARE_PROVIDER_SITE_OTHER): Payer: Medicare Other | Admitting: Physician Assistant

## 2017-08-11 ENCOUNTER — Encounter: Payer: Self-pay | Admitting: Physician Assistant

## 2017-08-11 ENCOUNTER — Encounter (INDEPENDENT_AMBULATORY_CARE_PROVIDER_SITE_OTHER): Payer: Self-pay

## 2017-08-11 ENCOUNTER — Other Ambulatory Visit (INDEPENDENT_AMBULATORY_CARE_PROVIDER_SITE_OTHER): Payer: Medicare Other

## 2017-08-11 VITALS — BP 138/62 | HR 80 | Ht 65.0 in | Wt 106.0 lb

## 2017-08-11 DIAGNOSIS — R634 Abnormal weight loss: Secondary | ICD-10-CM | POA: Diagnosis not present

## 2017-08-11 DIAGNOSIS — R197 Diarrhea, unspecified: Secondary | ICD-10-CM

## 2017-08-11 LAB — SEDIMENTATION RATE: SED RATE: 13 mm/h (ref 0–30)

## 2017-08-11 LAB — HIGH SENSITIVITY CRP: CRP HIGH SENSITIVITY: 0.76 mg/L (ref 0.000–5.000)

## 2017-08-11 NOTE — Progress Notes (Addendum)
Subjective:    Patient ID: Kaitlin [name], female    DOB: 1927/09/22, 83 y.o.   MRN: [id number]  HPI Kaitlin [name] is a pleasant 83 year old white female, known to Dr. Carlean Purl who has history of GERD, diverticulosis, and IBS. She also has remote history of colon polyps. She is status post cholecystectomy. She comes in today with complaints of weight loss, abdominal cramping and diarrhea. By her report she has lost 8 or 9 pounds in the past year. Looking back at her records her weight was 115 in 2008. She is more concerned today about daily symptoms of abdominal cramping, urgency and diarrhea postprandially which seems to be most prevalent in the mornings. She says she'll usually have 4-5 loose stools every morning after eating breakfast within about 30 minutes. Says she sometimes has symptoms after lunch and dinner but not as bad as morning time. She denies any symptoms significant problems with bloating or distention. Her appetite is fair. She is asking today about a bacterial infection that she believes she had many years ago and was treated by Dr. Suella Grove with antibiotics. She says this helped her symptoms at that time and that she had also lost some weight due to that illness. I do not find any records of her being treated for H. pylori or small bowel bacterial overgrowth dating back at least 2008. Last colonoscopy was done in August 2011 showing severe sigmoid diverticulosis with tortuosity and angled sigmoid colon, no polyps. Because of the concerns for weight loss when expressed to her PCP there was also some cervical lymphadenopathy found and she underwent a recent CT scan of the neck chest abdomen and pelvis which were unremarkable for any significant pathology and no evidence of lymphadenopathy. She has  had chronically dilated common bile duct and intrahepatics and mild pancreatic duct dilation and pancreas divisum  anatomy . She had been referred to oncology and was seen by Dr. Sonny Dandy who  felt that the cervical lymphadenopathy may have been infectious as this seemed to resolve after treatment with an antibiotic.   Recent labs October 2018 with normal CBC and chemistries, negative ANA, TSH 2.69  Review of Systems Pertinent positive and negative review of systems were noted in the above HPI section.  All other review of systems was otherwise negative.  Outpatient Encounter Prescriptions as of 08/11/2017  Medication Sig  . ALPRAZolam (XANAX) 0.25 MG tablet Take 0.125 mg by mouth 3 (three) times daily as needed for anxiety. For sleep  . B Complex-C (SUPER B COMPLEX) TABS Take 1 tablet by mouth daily.    . cholecalciferol (VITAMIN D) 1000 units tablet Take 1,000 Units by mouth daily.  [name] HYDROcodone-acetaminophen (NORCO/VICODIN) 5-325 MG per tablet Take 1 tablet by mouth every 6 (six) hours as needed for moderate pain.  . nitroGLYCERIN (NITROSTAT) 0.4 MG SL tablet Place 1 tablet (0.4 mg total) under the tongue every 5 (five) minutes x 3 doses as needed for chest pain.  . Omega-3 Fatty Acids (FISH OIL) 1000 MG CAPS Take 1,000 mg by mouth daily.   . Probiotic Product (PROBIOTIC DAILY PO) Take 1 capsule by mouth daily.  . [DISCONTINUED] OVER THE COUNTER MEDICATION   . apixaban (ELIQUIS) 2.5 MG TABS tablet Take 1 tablet (2.5 mg total) by mouth 2 (two) times daily.  . [DISCONTINUED] polyethylene glycol (MIRALAX / GLYCOLAX) packet Take 17 g by mouth daily as needed for mild constipation.    No facility-administered encounter medications on file as of 08/11/2017.  Allergies  Allergen Reactions  . Dilaudid [Hydromorphone Hcl] Other (See Comments)    Dropped heart rate really low  . Doxycycline     Sever dizziness and nausea  . Codeine Nausea And Vomiting and Rash  . Morphine And Related Nausea And Vomiting and Rash  . Nizatidine Other (See Comments)    Unknown reaction    Patient Active Problem List   Diagnosis Date Noted  . Fatigue 07/14/2017  . Lymphadenopathy 07/14/2017  .  FUO (fever of unknown origin) 07/14/2017  . Left leg weakness 03/07/2017  . Monoclonal paraproteinemia 03/07/2017  . Atrial fibrillation ([place]) 03/07/2017  . Atrial fibrillation with RVR ([place])   . Cerebrovascular accident (CVA) ([place])   . Esophageal dysmotility 10/18/2014  . GERD (gastroesophageal reflux disease)   . Osteoarthritis   . Mitral valve prolapse   . Tachy-brady syndrome ([place])   . Chest pain   . NSTEMI (non-ST elevated myocardial infarction) ([place]) 01/25/2014  . Essential hypertension, benign 08/11/2013  . Sinus node dysfunction/post termination pauses   . Chronic right lower quadrant pain 11/12/2011  . COLONIC POLYPS, HX OF 04/12/2010  . ANXIETY DEPRESSION 02/07/2009  . PERIPHERAL NEUROPATHY 02/07/2009  . Mitral valve disorder 02/07/2009  . AF (paroxysmal atrial fibrillation) ([place]) 02/07/2009  . OSTEOARTHRITIS 02/07/2009  . BACK PAIN 02/07/2009  . INSOMNIA 02/07/2009  . GERD 06/23/2008  . Irritable bowel syndrome 06/23/2008  . DIVERTICULOSIS OF COLON 12/07/2007   Social History   Social History  . Marital status: Widowed    Spouse name: N/A  . Number of children: 6  . Years of education: N/A   Occupational History  . Retired     Social History Main Topics  . Smoking status: Never Smoker  . Smokeless tobacco: Never Used  . Alcohol use No  . Drug use: No  . Sexual activity: Not on file   Other Topics Concern  . Not on file   Social History Narrative  . No narrative on file    Ms. Lease's family history includes Arthritis in her brother; Bone cancer in her maternal grandfather; Diabetes in her father; Healthy in her brother, brother, and sister; Heart disease in her mother and sister; Other in her brother; Prostate cancer in her paternal grandfather; Stroke in her brother; Thyroid cancer in her sister.      Objective:    Vitals:   08/11/17 1436  BP: 138/62  Pulse: 80    Physical Exam; well-developed thin and frail appearing elderly white female  in no acute distress, accompanied by her daughter blood pressure 138/62, pulse 80, height 5 foot 5, weight 106, BMI 17.6. HEENT; nontraumatic normocephalic EOMI PERRLA sclera anicteric, Cardiovascular regular rate and rhythm with S1-S2, Pulmonary ;clear bilaterally, Abdomen; soft, there is no focal tenderness no guarding or rebound no palpable mass or hepatosplenomegaly bowel sounds are somewhat hyperactive, Rectal ;exam not done, Extremities ;no clubbing cyanosis or edema skin warm and dry, Neuropsych; mood and affect appropriate       Assessment & Plan:   #47 83 year old white female with history of diverticulosis, diverticulitis, IBS, remote colon polyps who comes in with complaints of a flareup of her chronic GI issues with postprandial abdominal cramping and urgency and diarrhea particularly after breakfast. She also relates possibly a 30 pound weight loss over the past 3 years, I believe her weight has fluctuated as even back in 2008 her weight was 115 and is now 106. Patient had CT of the neck chest abdomen and pelvis earlier  this month with no worrisome findings. I suspect her cramping urgency and diarrhea are related to IBS D, she may have a component of bacterial overgrowth. Cannot rule out microscopic colitis-however she has a very tortuous angulated colon, making colonoscopy very difficult, and would like to avoid  #2 coronary artery disease status post MI #3 atrial fibrillation #4 history of CVA-on eliquis #5 anxiety #6 status post cholecystectomy #7 history of monoclonal gammopathy  Plan; check H. pylori stool antigen, stool for lactoferrin stool for C. difficile PCR, Check sedimentation rate and CRP Will give patient and empiric course of Xifaxan 550 mg by mouth 3 times a day 14 days for small intestinal bacterial overgrowth. She is very sensitive to medications and hesitant to be placed on anything new. We discussed a trial of an anti-spasmodic. Will wait and see how she does  after Xifaxan.   Mathias Bogacki S Chloris Marcoux PA-C 08/11/2017   Cc: [name], MD Agree with Ms. Genia Harold assessment and plan. Gatha Mayer, MD, [facility]

## 2017-08-11 NOTE — Patient Instructions (Addendum)
Please go to the basement level to have your labs drawn and stool studies.   We have given you samples of Xifaxan 550 mg. Take 1 tab 3 times daily with food for 14 days.   Please call with a progress report when you have finished the medication.
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
## 2017-08-13 ENCOUNTER — Encounter: Payer: Self-pay | Admitting: Nurse Practitioner

## 2017-08-13 ENCOUNTER — Ambulatory Visit (INDEPENDENT_AMBULATORY_CARE_PROVIDER_SITE_OTHER): Payer: Medicare Other | Admitting: Nurse Practitioner

## 2017-08-13 VITALS — BP 131/80 | HR 83 | Ht 65.0 in | Wt 104.0 lb

## 2017-08-13 DIAGNOSIS — E785 Hyperlipidemia, unspecified: Secondary | ICD-10-CM | POA: Diagnosis not present

## 2017-08-13 DIAGNOSIS — I4821 Permanent atrial fibrillation: Secondary | ICD-10-CM

## 2017-08-13 DIAGNOSIS — I10 Essential (primary) hypertension: Secondary | ICD-10-CM | POA: Diagnosis not present

## 2017-08-13 DIAGNOSIS — I482 Chronic atrial fibrillation: Secondary | ICD-10-CM

## 2017-08-13 DIAGNOSIS — I6319 Cerebral infarction due to embolism of other precerebral artery: Secondary | ICD-10-CM

## 2017-08-13 NOTE — Progress Notes (Signed)
Personally  participated in, made any corrections needed, and agree with history, physical, neuro exam,assessment and plan as stated above.    Kimo Bancroft, MD [facility] 

## 2017-08-13 NOTE — Patient Instructions (Signed)
Stressed the importance of management of risk factors to prevent further stroke Continue Eliquis for secondary stroke prevention and atrial fibrillation  Maintain strict control of hypertension with blood pressure goal below 130/90, today's reading 131/80 Control of diabetes with hemoglobin A1c below 6.5 followed by primary care Cholesterol with LDL cholesterol less than 70, followed by primary care, will patient currently on fish oil has stopped Lipitor Physical therapy # [phone]  eat healthy diet with whole grains,  fresh fruits and vegetables Follow up 6 months

## 2017-08-18 ENCOUNTER — Other Ambulatory Visit: Payer: Medicare Other

## 2017-08-18 DIAGNOSIS — R509 Fever, unspecified: Secondary | ICD-10-CM | POA: Diagnosis not present

## 2017-08-18 DIAGNOSIS — R634 Abnormal weight loss: Secondary | ICD-10-CM

## 2017-08-18 DIAGNOSIS — R197 Diarrhea, unspecified: Secondary | ICD-10-CM

## 2017-08-18 DIAGNOSIS — D472 Monoclonal gammopathy: Secondary | ICD-10-CM | POA: Diagnosis not present

## 2017-08-18 DIAGNOSIS — E440 Moderate protein-calorie malnutrition: Secondary | ICD-10-CM | POA: Diagnosis not present

## 2017-08-18 DIAGNOSIS — I481 Persistent atrial fibrillation: Secondary | ICD-10-CM | POA: Diagnosis not present

## 2017-08-18 DIAGNOSIS — R1084 Generalized abdominal pain: Secondary | ICD-10-CM | POA: Diagnosis not present

## 2017-08-18 DIAGNOSIS — M5417 Radiculopathy, lumbosacral region: Secondary | ICD-10-CM | POA: Diagnosis not present

## 2017-08-18 DIAGNOSIS — K224 Dyskinesia of esophagus: Secondary | ICD-10-CM | POA: Diagnosis not present

## 2017-08-19 LAB — HELICOBACTER PYLORI  SPECIAL ANTIGEN
MICRO NUMBER: 81240971
SPECIMEN QUALITY: ADEQUATE

## 2017-08-21 DIAGNOSIS — H524 Presbyopia: Secondary | ICD-10-CM | POA: Diagnosis not present

## 2017-08-21 DIAGNOSIS — H04123 Dry eye syndrome of bilateral lacrimal glands: Secondary | ICD-10-CM | POA: Diagnosis not present

## 2017-08-21 DIAGNOSIS — H353131 Nonexudative age-related macular degeneration, bilateral, early dry stage: Secondary | ICD-10-CM | POA: Diagnosis not present

## 2017-08-21 DIAGNOSIS — H532 Diplopia: Secondary | ICD-10-CM | POA: Diagnosis not present

## 2017-08-21 DIAGNOSIS — H40013 Open angle with borderline findings, low risk, bilateral: Secondary | ICD-10-CM | POA: Diagnosis not present

## 2017-08-21 LAB — CLOSTRIDIUM DIFFICILE BY PCR: Toxigenic C. Difficile by PCR: NOT DETECTED

## 2017-08-22 LAB — FECAL LACTOFERRIN, QUANT
Fecal Lactoferrin: NEGATIVE
MICRO NUMBER:: 81252413
SPECIMEN QUALITY:: ADEQUATE

## 2017-08-29 DIAGNOSIS — H04123 Dry eye syndrome of bilateral lacrimal glands: Secondary | ICD-10-CM | POA: Diagnosis not present

## 2017-08-29 DIAGNOSIS — H524 Presbyopia: Secondary | ICD-10-CM | POA: Diagnosis not present

## 2017-08-29 DIAGNOSIS — H532 Diplopia: Secondary | ICD-10-CM | POA: Diagnosis not present

## 2017-08-29 DIAGNOSIS — H353131 Nonexudative age-related macular degeneration, bilateral, early dry stage: Secondary | ICD-10-CM | POA: Diagnosis not present

## 2017-08-29 DIAGNOSIS — H52223 Regular astigmatism, bilateral: Secondary | ICD-10-CM | POA: Diagnosis not present

## 2017-08-29 DIAGNOSIS — H5213 Myopia, bilateral: Secondary | ICD-10-CM | POA: Diagnosis not present

## 2017-08-29 DIAGNOSIS — H40023 Open angle with borderline findings, high risk, bilateral: Secondary | ICD-10-CM | POA: Diagnosis not present

## 2017-09-29 ENCOUNTER — Other Ambulatory Visit: Payer: Self-pay | Admitting: Internal Medicine

## 2017-09-29 NOTE — Telephone Encounter (Signed)
Eliquis 2.5mg  refill received; pt is 83 yrs old, wt-47.2kg, Crea-0.80 on 07/28/17, Dr. Caryl Comes on 07/08/17; will send in refill request to requested pharmacy.

## 2017-10-24 ENCOUNTER — Ambulatory Visit (INDEPENDENT_AMBULATORY_CARE_PROVIDER_SITE_OTHER): Payer: Medicare Other | Admitting: Interventional Cardiology

## 2017-10-24 ENCOUNTER — Encounter: Payer: Self-pay | Admitting: Interventional Cardiology

## 2017-10-24 VITALS — BP 124/62 | HR 70 | Ht 65.0 in | Wt 109.4 lb

## 2017-10-24 DIAGNOSIS — I495 Sick sinus syndrome: Secondary | ICD-10-CM | POA: Diagnosis not present

## 2017-10-24 DIAGNOSIS — Z7901 Long term (current) use of anticoagulants: Secondary | ICD-10-CM

## 2017-10-24 DIAGNOSIS — I6389 Other cerebral infarction: Secondary | ICD-10-CM

## 2017-10-24 DIAGNOSIS — I480 Paroxysmal atrial fibrillation: Secondary | ICD-10-CM
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
## 2017-10-24 NOTE — Patient Instructions (Signed)

## 2017-12-23 DIAGNOSIS — D472 Monoclonal gammopathy: Secondary | ICD-10-CM | POA: Diagnosis not present

## 2017-12-23 DIAGNOSIS — G609 Hereditary and idiopathic neuropathy, unspecified: Secondary | ICD-10-CM | POA: Diagnosis not present

## 2017-12-23 DIAGNOSIS — Z1389 Encounter for screening for other disorder: Secondary | ICD-10-CM | POA: Diagnosis not present

## 2017-12-23 DIAGNOSIS — I481 Persistent atrial fibrillation: Secondary | ICD-10-CM | POA: Diagnosis not present

## 2017-12-23 DIAGNOSIS — M5417 Radiculopathy, lumbosacral region: Secondary | ICD-10-CM | POA: Diagnosis not present

## 2017-12-23 DIAGNOSIS — R911 Solitary pulmonary nodule: Secondary | ICD-10-CM | POA: Diagnosis not present

## 2017-12-23 DIAGNOSIS — F410 Panic disorder [episodic paroxysmal anxiety] without agoraphobia: Secondary | ICD-10-CM | POA: Diagnosis not present

## 2017-12-23 DIAGNOSIS — R509 Fever, unspecified: Secondary | ICD-10-CM | POA: Diagnosis not present

## 2017-12-23 DIAGNOSIS — E440 Moderate protein-calorie malnutrition: Secondary | ICD-10-CM | POA: Diagnosis not present

## 2017-12-23 DIAGNOSIS — Z0000 Encounter for general adult medical examination without abnormal findings: Secondary | ICD-10-CM | POA: Diagnosis not present

## 2018-01-02 DIAGNOSIS — H532 Diplopia: Secondary | ICD-10-CM | POA: Diagnosis not present

## 2018-01-02 DIAGNOSIS — H353131 Nonexudative age-related macular degeneration, bilateral, early dry stage: Secondary | ICD-10-CM | POA: Diagnosis not present

## 2018-01-02 DIAGNOSIS — H401131 Primary open-angle glaucoma, bilateral, mild stage: Secondary | ICD-10-CM | POA: Diagnosis not present

## 2018-01-02 DIAGNOSIS — H04123 Dry eye syndrome of bilateral lacrimal glands: Secondary | ICD-10-CM | POA: Diagnosis not present

## 2018-01-07 ENCOUNTER — Encounter: Payer: Self-pay | Admitting: Internal Medicine

## 2018-01-07 ENCOUNTER — Ambulatory Visit (INDEPENDENT_AMBULATORY_CARE_PROVIDER_SITE_OTHER): Payer: Medicare Other | Admitting: Internal Medicine

## 2018-01-07 VITALS — BP 130/72 | HR 84 | Ht 65.0 in | Wt 109.0 lb

## 2018-01-07 DIAGNOSIS — I6389 Other cerebral infarction: Secondary | ICD-10-CM

## 2018-01-07 DIAGNOSIS — I482 Chronic atrial fibrillation: Secondary | ICD-10-CM

## 2018-01-07 DIAGNOSIS — Z7901 Long term (current) use of anticoagulants: Secondary | ICD-10-CM | POA: Diagnosis not present

## 2018-01-07 DIAGNOSIS — I10 Essential (primary) hypertension: Secondary | ICD-10-CM | POA: Diagnosis not present

## 2018-01-07 DIAGNOSIS — I4821 Permanent atrial fibrillation: Secondary | ICD-10-CM

## 2018-01-07 MED ORDER — PROPRANOLOL HCL 10 MG PO TABS: 10.0000 mg | ORAL_TABLET | ORAL | 3 refills | Status: DC | PRN

## 2018-01-07 NOTE — Patient Instructions (Addendum)
Medication Instructions:  Your physician has recommended you make the following change in your medication:   Begin Propranolol (Inderol), 10 mg tablet, As needed at bedtime for heart palpitations.  Labwork: CBC and BMP for tomorrow  Testing/Procedures: None ordered.  Follow-Up: Your physician recommends that you schedule a follow-up appointment in:   One Year with Dr Caryl Comes   Any Other Special Instructions Will Be Listed Below (If Applicable).       If you need a refill on your cardiac medications before your next appointment, please call your pharmacy.
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
## 2018-01-08 ENCOUNTER — Other Ambulatory Visit: Payer: Medicare Other

## 2018-01-08 DIAGNOSIS — I10 Essential (primary) hypertension: Secondary | ICD-10-CM | POA: Diagnosis not present

## 2018-01-08 DIAGNOSIS — I4821 Permanent atrial fibrillation: Secondary | ICD-10-CM

## 2018-01-08 DIAGNOSIS — Z7901 Long term (current) use of anticoagulants: Secondary | ICD-10-CM

## 2018-01-08 DIAGNOSIS — I482 Chronic atrial fibrillation: Secondary | ICD-10-CM | POA: Diagnosis not present

## 2018-01-08 NOTE — Addendum Note (Signed)
Addended by: Dollene Primrose on: 01/08/2018 12:42 PM   Modules accepted: Orders

## 2018-01-09 LAB — CBC WITH DIFFERENTIAL/PLATELET
Basophils Absolute: 0 10*3/uL (ref 0.0–0.2)
Basos: 0 %
EOS (ABSOLUTE): 0.1 10*3/uL (ref 0.0–0.4)
EOS: 1 %
HEMATOCRIT: 43.7 % (ref 34.0–46.6)
Hemoglobin: 14.4 g/dL (ref 11.1–15.9)
Immature Grans (Abs): 0 10*3/uL (ref 0.0–0.1)
Immature Granulocytes: 0 %
LYMPHS ABS: 1.3 10*3/uL (ref 0.7–3.1)
Lymphs: 23 %
MCH: 30.5 pg (ref 26.6–33.0)
MCHC: 33 g/dL (ref 31.5–35.7)
MCV: 93 fL (ref 79–97)
MONOS ABS: 0.5 10*3/uL (ref 0.1–0.9)
Monocytes: 9 %
Neutrophils Absolute: 3.6 10*3/uL (ref 1.4–7.0)
Neutrophils: 67 %
PLATELETS: 202 10*3/uL (ref 150–379)
RBC: 4.72 x10E6/uL (ref 3.77–5.28)
RDW: 14.3 % (ref 12.3–15.4)
WBC: 5.5 10*3/uL (ref 3.4–10.8)

## 2018-01-09 LAB — BASIC METABOLIC PANEL
BUN / CREAT RATIO: 30 — AB (ref 12–28)
BUN: 21 mg/dL (ref 8–27)
CHLORIDE: 103 mmol/L (ref 96–106)
CO2: 22 mmol/L (ref 20–29)
CREATININE: 0.69 mg/dL (ref 0.57–1.00)
Calcium: 9.1 mg/dL (ref 8.7–10.3)
GFR calc Af Amer: 90 mL/min/{1.73_m2} (ref >59)
GFR calc non Af Amer: 78 mL/min/{1.73_m2} (ref >59)
GLUCOSE: 85 mg/dL (ref 65–99)
POTASSIUM: 4.5 mmol/L (ref 3.5–5.2)
SODIUM: 140 mmol/L (ref 134–144)

## 2018-01-12 IMAGING — CT CT ABD-PELV W/ CM
2 of 5 series · 15 of 46 positions shown, 17 images · IV contrast (ISOVUE 300)
Comparison: Abdominal CT 12/12/2011

CLINICAL DATA: Intermittent abdominal pain. Postprandial pain for
2-3 months.

EXAM:
CT ABDOMEN AND PELVIS WITH CONTRAST
TECHNIQUE: Multidetector CT imaging of the abdomen and pelvis was performed
using the standard protocol following bolus administration of
intravenous contrast.
CONTRAST:  100mL [id number] IOPAMIDOL (R5ZH4K-QSS) INJECTION 61%

[Series 2: abd/ pelvis · axial · 0.65mm/px · z∈[-481,-106]mm · 12 of 85 slices shown, 14 images]
[im 5/85  soft-tissue]
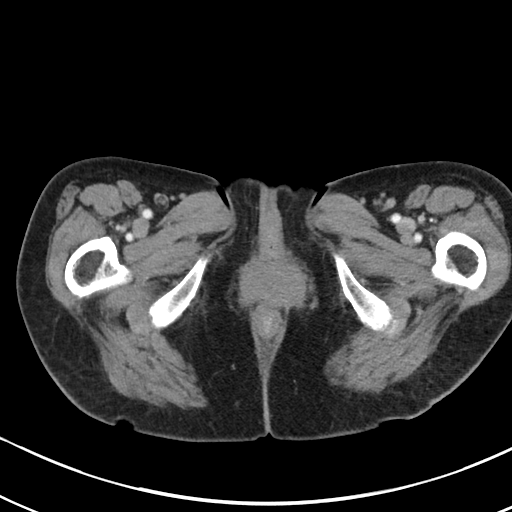
[im 5/85  bone]
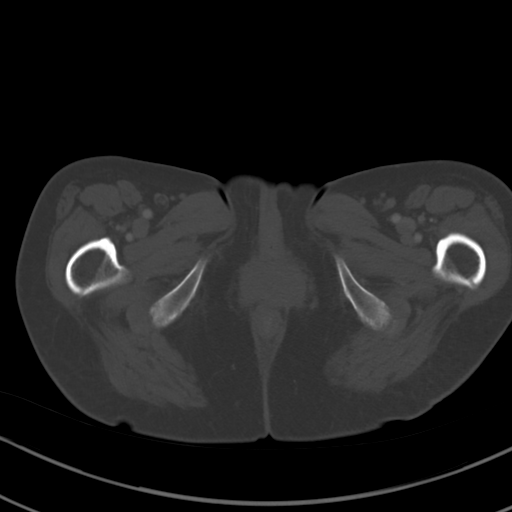
[im 14/85  soft-tissue]
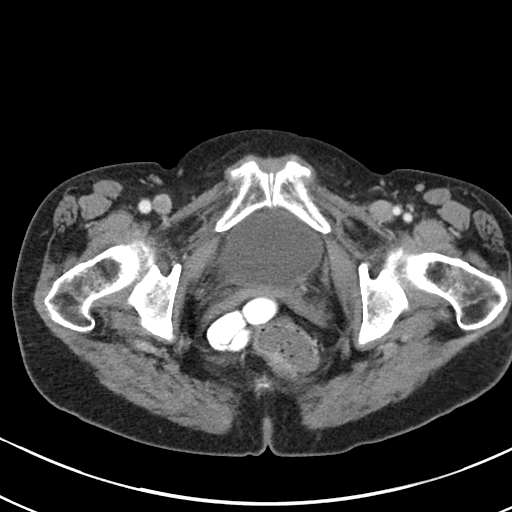
[im 18/85  soft-tissue]
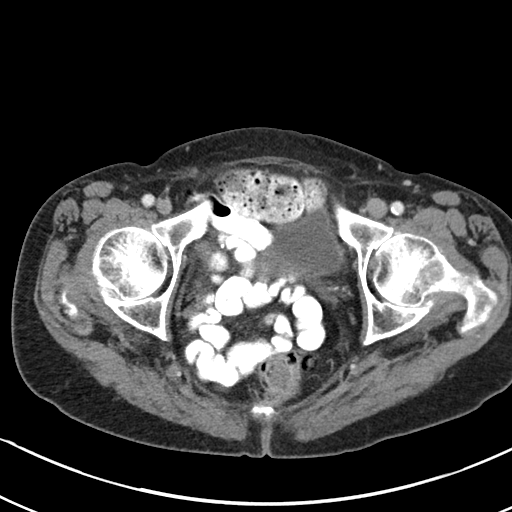
[im 27/85  soft-tissue]
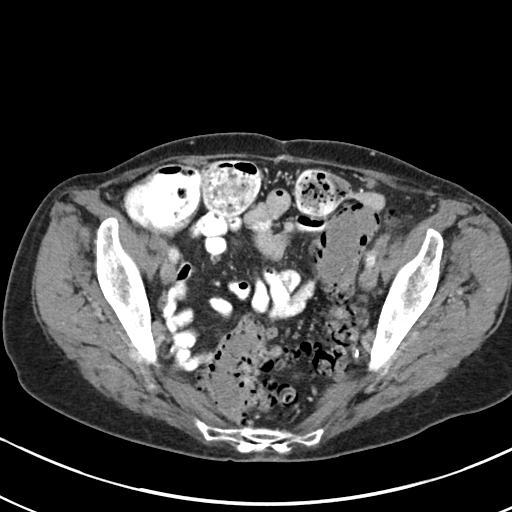
[im 31/85  soft-tissue]
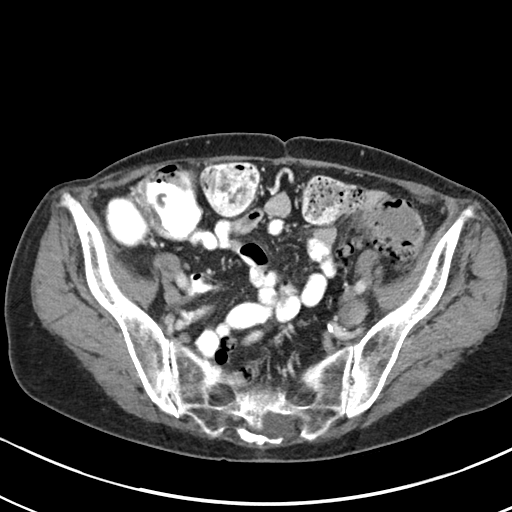
[im 40/85  soft-tissue]
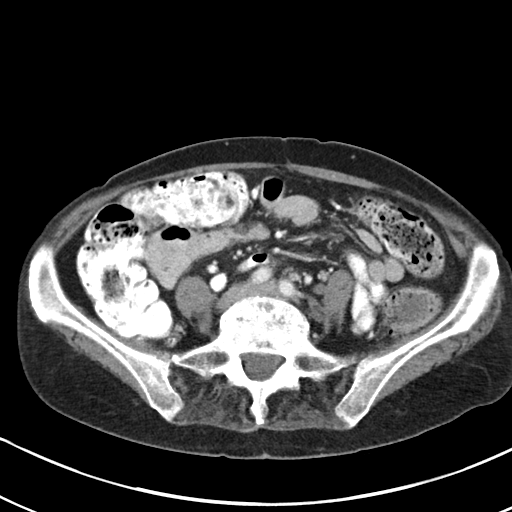
[im 45/85  soft-tissue]
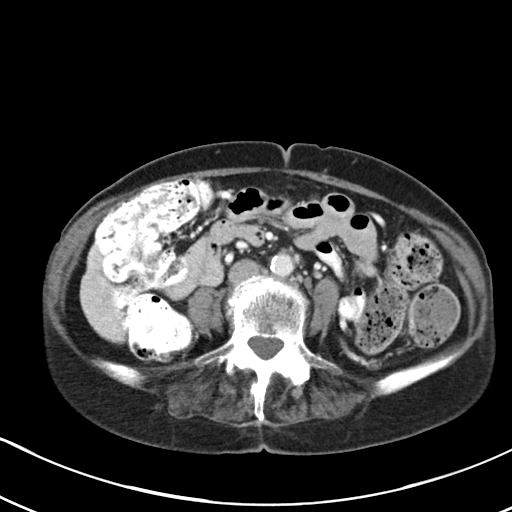
[im 54/85  soft-tissue]
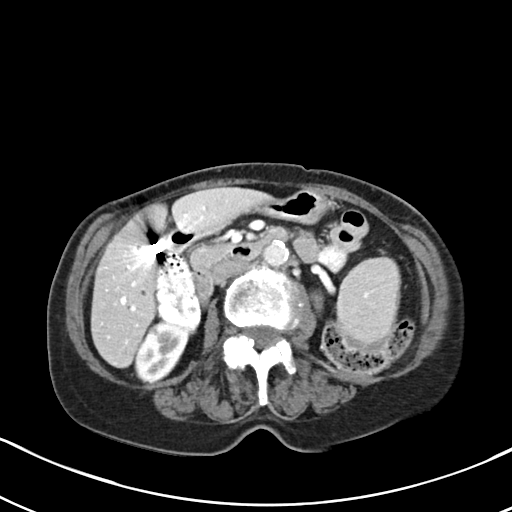
[im 58/85  soft-tissue]
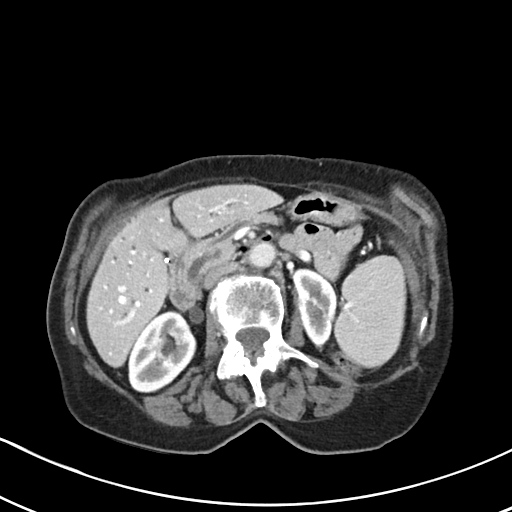
[im 58/85  bone]
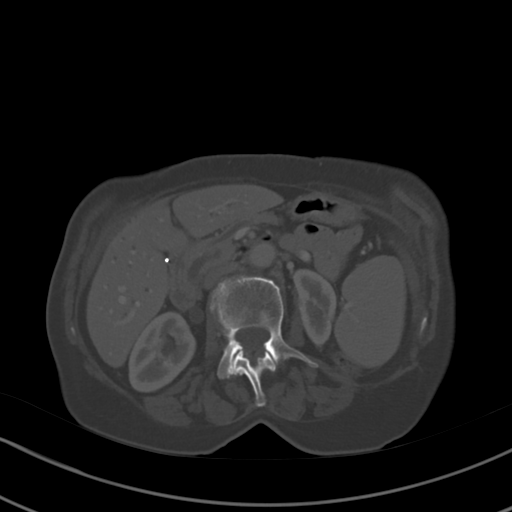
[im 67/85  soft-tissue]
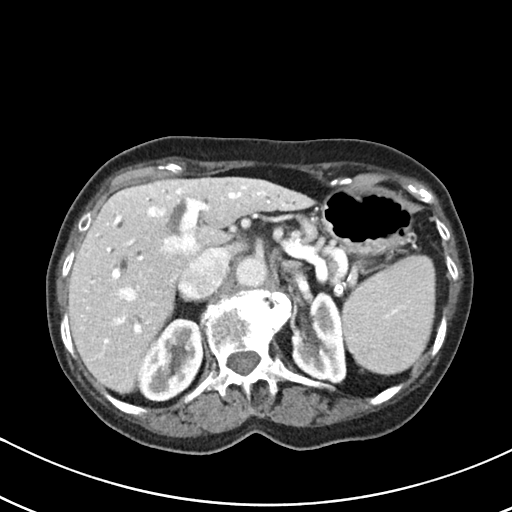
[im 71/85  soft-tissue]
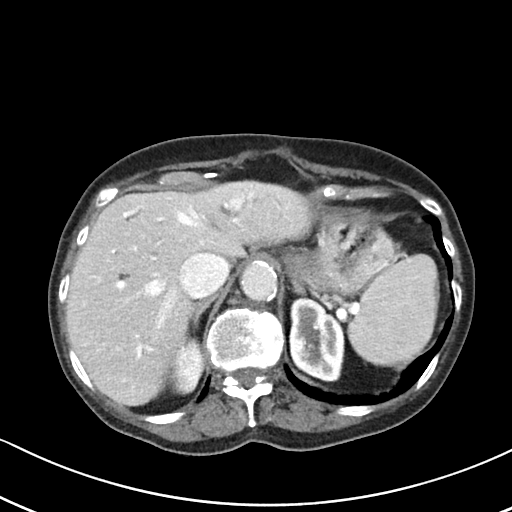
[im 80/85  soft-tissue]
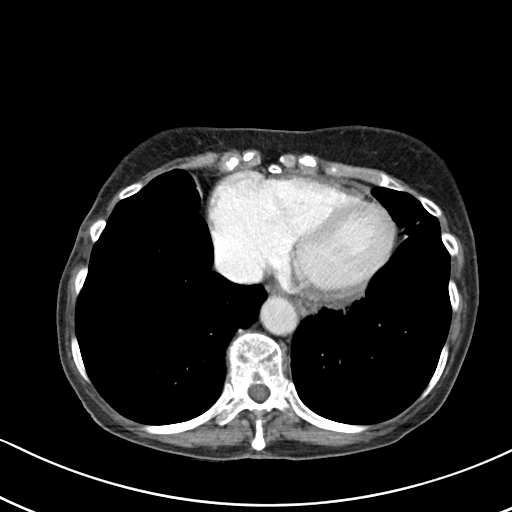

[Series 5: coronal soft tissue · coronal · 0.61mm/px · 3 of 59 slices shown]
[im 20/59  soft-tissue]
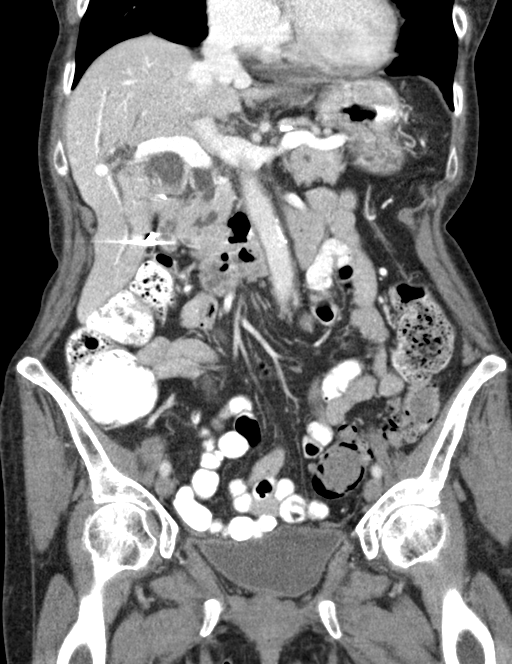
[im 26/59  soft-tissue]
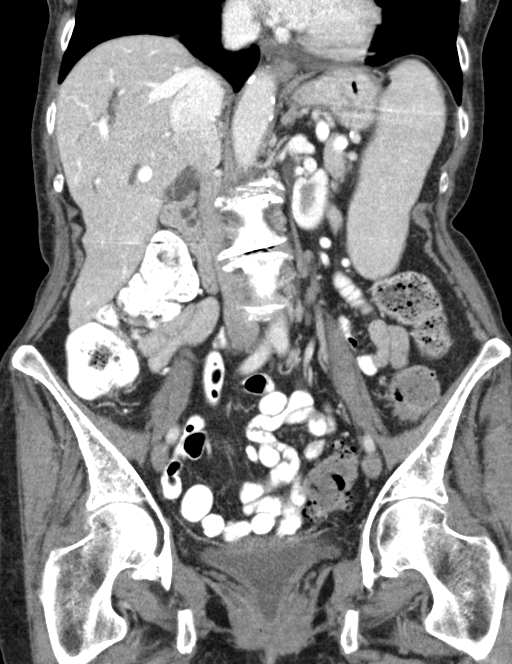
[im 33/59  soft-tissue]
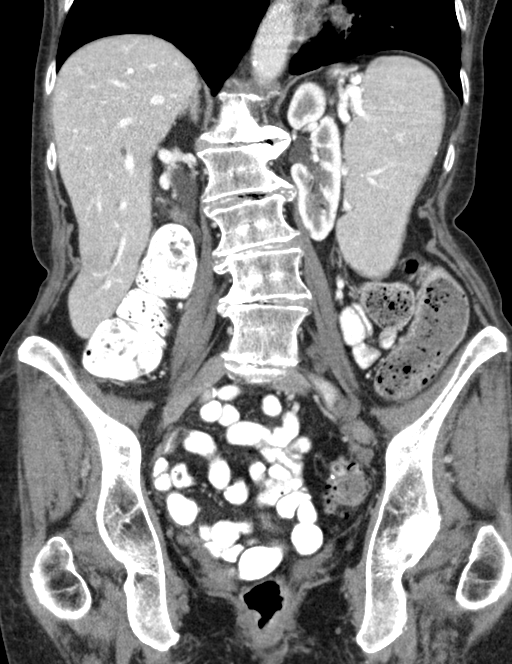

[15 of 46 positions shown; findings below may reference images not displayed]

FINDINGS: Lower chest: Stable areas of scarring at the lung bases. No pleural
effusions. Tiny subtle opacity in the right lower lobe on image 5 is
probably incidental.

Hepatobiliary: Gallbladder has been removed. Again noted is
intrahepatic and extrahepatic biliary dilatation. The degree of
dilatation biliary dilatation has progressed. Extrahepatic bile duct
now measures up to 1.5 cm and previously measured 1.2 cm. The main
right intrahepatic bile duct now measures up to 1.2 cm and
previously measured 0.9 cm. Portal venous system is patent. Hepatic
veins are patent. No suspicious liver lesion.

Pancreas: Again noted is diffuse dilatation of the main pancreatic
duct which is stable. No suspicious pancreatic lesion.

Spleen: Normal appearance of spleen without enlargement.

Adrenals/Urinary Tract: Normal adrenal glands. Stable fullness of
the renal pelvises these are suggestive for extrarenal pelvises.
Evidence for small renal cysts. Urinary bladder is unremarkable.

Stomach/Bowel: Stomach is unremarkable. Normal appearance of small
bowel. Extensive diverticulosis in the sigmoid colon without acute
inflammation. Large amount of stool throughout the colon.

Vascular/Lymphatic: Atherosclerotic disease in the abdominal aorta
without aneurysm. Although this is not a CTA examination, the SMA
and celiac trunk are patent. SMA appears to be widely patent. There
could be mild stenosis at the origin the celiac trunk from calcified
plaque. IMA is patent. No enlarged or suspicious lymph nodes in the
abdomen or pelvis.

Reproductive: Uterus has been removed. No suspicious adnexal
lesions.

Other: No free fluid.  No free air.

Musculoskeletal: Multilevel degenerative disc and facet disease in
the lumbar spine.
IMPRESSION: Chronic intrahepatic and extrahepatic biliary dilatation likely
secondary to the cholecystectomy. The degree of biliary dilatation
has progressed since 4461. Recommend correlation with liver function
tests.

Extensive diverticulosis without acute colonic inflammation.

Atherosclerotic disease in the abdominal aorta. No significant
stenosis involving the visceral arteries, although this is not a
dedicated CTA examination.

## 2018-01-20 ENCOUNTER — Telehealth: Payer: Self-pay | Admitting: Internal Medicine

## 2018-01-20 NOTE — Telephone Encounter (Signed)
New Message  Pt is calling to get the results and to send her a copy. Please call States she has a dental appt and ok to leave detailed message

## 2018-01-20 NOTE — Telephone Encounter (Signed)
Pt requesting a copy of her labs to be sent to her home in the mail. I told her I will do that after Dr Caryl Comes has had a chance to review them. All of her labs are WNL. Pt understands and had no additional questions.
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
## 2018-02-11 ENCOUNTER — Encounter: Payer: Self-pay | Admitting: Nurse Practitioner

## 2018-02-11 ENCOUNTER — Ambulatory Visit (INDEPENDENT_AMBULATORY_CARE_PROVIDER_SITE_OTHER): Payer: Medicare Other | Admitting: Nurse Practitioner

## 2018-02-11 VITALS — BP 131/64 | HR 66 | Ht 65.0 in | Wt 110.2 lb

## 2018-02-11 DIAGNOSIS — Z8673 Personal history of transient ischemic attack (TIA), and cerebral infarction without residual deficits: Secondary | ICD-10-CM | POA: Insufficient documentation

## 2018-02-11 DIAGNOSIS — I6389 Other cerebral infarction: Secondary | ICD-10-CM

## 2018-02-11 DIAGNOSIS — I480 Paroxysmal atrial fibrillation: Secondary | ICD-10-CM

## 2018-02-11 NOTE — Patient Instructions (Signed)
Stressed the importance of management of risk factors to prevent further stroke Continue Eliquis for secondary stroke prevention and atrial fibrillation  Maintain strict control of hypertension with blood pressure goal below 130/90, today's reading 131/64 Control of diabetes with hemoglobin A1c below 6.5 followed by primary care Cholesterol with LDL cholesterol less than 70, followed by primary care,  patient currently on fish oil has stopped Lipitor  eat healthy diet with whole grains,  fresh fruits and vegetables Continue bike  exercises tolerated Follow up 8 months

## 2018-02-20 NOTE — Progress Notes (Signed)
Personally  participated in, made any corrections needed, and agree with history, physical, neuro exam,assessment and plan as stated above.    Antonia Ahern, MD [facility] 

## 2018-02-26 ENCOUNTER — Telehealth: Payer: Self-pay | Admitting: Interventional Cardiology

## 2018-02-26 DIAGNOSIS — R002 Palpitations: Secondary | ICD-10-CM

## 2018-02-26 NOTE — Telephone Encounter (Signed)
Her atrial fib is thought to be permanent, so the question is what is she feeling that she is describing as A. Fib. A 48-hour Holter monitor may be of benefit.

## 2018-02-26 NOTE — Telephone Encounter (Signed)
I will defer to Dr. Olin Pia judgment concerning this.  And note that low-dose propranolol was recommended at bedtime.  Not sure if she is complying with that recommendation.

## 2018-02-26 NOTE — Telephone Encounter (Signed)
Pt is agreeable to 48hr monitor. Order placed and message sent to have it arranged.

## 2018-02-26 NOTE — Telephone Encounter (Signed)
Pt states for several days now she has felt like she is going in and out of Afib.  States the monitor she has at home says she is.  This AM vitals were 137/99, HR 90.  Denies CP but does c/o SOB when she feels like she's out of rhythm.  Pt has been taking Propanolol on a regular basis at night lately.  Did not take last night because she felt better and HR was 53.  Pt states she does feel better after taking Propanolol.  Scheduled pt to see Robbie Lis, PA-C tomorrow at 3pm.  Advised I would send message to Dr. [name] and Dr. Caryl Comes for review in the meantime.  Pt appreciative for call.

## 2018-02-26 NOTE — Telephone Encounter (Signed)
Pt c/o Shortness Of Breath: STAT if SOB developed within the last 24 hours or pt is noticeably SOB on the phone  1. Are you currently SOB (can you hear that pt is SOB on the phone)? Yes, yes  2. How long have you been experiencing SOB? days    3. Are you SOB when sitting or when up moving around? both  4. Are you currently experiencing any other symptoms? Pounding double beats in her chest 145/99   Hr 90

## 2018-02-26 NOTE — Telephone Encounter (Signed)
Thanks

## 2018-02-27 ENCOUNTER — Ambulatory Visit (INDEPENDENT_AMBULATORY_CARE_PROVIDER_SITE_OTHER): Payer: Medicare Other | Admitting: Physician Assistant

## 2018-02-27 ENCOUNTER — Encounter: Payer: Self-pay | Admitting: Physician Assistant

## 2018-02-27 VITALS — BP 112/64 | HR 64 | Ht 65.0 in | Wt 109.0 lb

## 2018-02-27 DIAGNOSIS — R0602 Shortness of breath: Secondary | ICD-10-CM | POA: Diagnosis not present

## 2018-02-27 DIAGNOSIS — I495 Sick sinus syndrome: Secondary | ICD-10-CM

## 2018-02-27 DIAGNOSIS — I482 Chronic atrial fibrillation: Secondary | ICD-10-CM | POA: Diagnosis not present

## 2018-02-27 DIAGNOSIS — I4821 Permanent atrial fibrillation: Secondary | ICD-10-CM

## 2018-02-27 DIAGNOSIS — I6389 Other cerebral infarction: Secondary | ICD-10-CM

## 2018-02-27 DIAGNOSIS — Z7901 Long term (current) use of anticoagulants: Secondary | ICD-10-CM | POA: Diagnosis not present
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
## 2018-02-27 NOTE — Patient Instructions (Signed)
Your physician recommends that you continue on your current medications as directed. Please refer to the Current Medication list given to you today.  Your physician has recommended that you wear a holter monitor. Holter monitors are medical devices that record the heart's electrical activity. Doctors most often use these monitors to diagnose arrhythmias. Arrhythmias are problems with the speed or rhythm of the heartbeat. The monitor is a small, portable device. You can wear one while you do your normal daily activities. This is usually used to diagnose what is causing palpitations/syncope (passing out).  [place] physician recommends that you schedule a follow-up appointment in: AS PLANNED

## 2018-03-02 NOTE — Addendum Note (Signed)
Addended by: Rose Phi on: 03/02/2018 05:33 PM   Modules accepted: Orders

## 2018-03-04 ENCOUNTER — Other Ambulatory Visit: Payer: Self-pay | Admitting: Physician Assistant

## 2018-03-04 ENCOUNTER — Ambulatory Visit (INDEPENDENT_AMBULATORY_CARE_PROVIDER_SITE_OTHER): Payer: Medicare Other

## 2018-03-04 DIAGNOSIS — R0602 Shortness of breath: Secondary | ICD-10-CM

## 2018-03-04 DIAGNOSIS — I4891 Unspecified atrial fibrillation: Secondary | ICD-10-CM

## 2018-03-10 ENCOUNTER — Telehealth: Payer: Self-pay | Admitting: Physician Assistant

## 2018-03-10 NOTE — Telephone Encounter (Signed)
Patient's daughter calling (DPR on file) and states that they can see the patient's monitor in MyChart but there is no notes on it. Made daughter aware that it looks like it still needs to be reviewed by Dr. Caryl Comes. Made her aware that I would forward the message Dr. Caryl Comes and his RN and Dr. Olin Pia RN will contact them with the results and any recommendations. She verbalized understanding and thanked me for the call.

## 2018-03-10 NOTE — Telephone Encounter (Signed)
New Message   Pt's daughter is calling to get the results for the monitor, states they seen it on my chart but would like it explained further. Please call

## 2018-03-12 NOTE — Telephone Encounter (Signed)
New Message   Pt's daughter calling to check on monitor results. Please call

## 2018-03-12 NOTE — Telephone Encounter (Signed)
Spoke with daughter regarding monitor results. Her mother has been feeling SOB and wondered if her symptoms had been recorded. I stated I would bring this to the attention of Dr Caryl Comes for review. She verbalized understanding and had no additional questions.

## 2018-03-18 ENCOUNTER — Telehealth: Payer: Self-pay | Admitting: Internal Medicine

## 2018-03-18 NOTE — Telephone Encounter (Signed)
New Message:       Pt is calling and asking for her results of her halter monitor

## 2018-03-18 NOTE — Telephone Encounter (Signed)
Pt is calling to follow-up on the final results of her 48 hour holter monitor results.  This is was ordered by pts EP Provider, Dr Caryl Comes (see telephone note 5/16).  Pt states you can see the monitor in mychart, but this has not been interpreted by a Cardiologist yet. Informed the pt that I do see this in Dr Olin Pia in-basket to review and advise on.  Informed the pt that I will route this message to both her General Cardiologist Dr [name] and Dr Caryl Comes, to help expedite the final result of her monitor.  Informed the pt that either Dr Thompson Caul Nurse or Dr Olin Pia RN, will follow-up with her shortly thereafter, once her study is complete and read. Pt verbalized understanding and agrees with this plan.

## 2018-03-26 NOTE — Telephone Encounter (Signed)
Pt aware of holter results; see result note.

## 2018-03-31 ENCOUNTER — Other Ambulatory Visit: Payer: Self-pay | Admitting: Internal Medicine

## 2018-05-11 DIAGNOSIS — H353131 Nonexudative age-related macular degeneration, bilateral, early dry stage: Secondary | ICD-10-CM | POA: Diagnosis not present

## 2018-05-11 DIAGNOSIS — H532 Diplopia: Secondary | ICD-10-CM | POA: Diagnosis not present

## 2018-05-11 DIAGNOSIS — H04123 Dry eye syndrome of bilateral lacrimal glands: Secondary | ICD-10-CM | POA: Diagnosis not present

## 2018-05-11 DIAGNOSIS — H401131 Primary open-angle glaucoma, bilateral, mild stage: Secondary | ICD-10-CM | POA: Diagnosis not present

## 2018-05-26 DIAGNOSIS — M5417 Radiculopathy, lumbosacral region: Secondary | ICD-10-CM | POA: Diagnosis not present

## 2018-05-26 DIAGNOSIS — R8290 Unspecified abnormal findings in urine: Secondary | ICD-10-CM | POA: Diagnosis not present

## 2018-05-26 DIAGNOSIS — M62830 Muscle spasm of back: Secondary | ICD-10-CM | POA: Diagnosis not present

## 2018-06-29 ENCOUNTER — Other Ambulatory Visit: Payer: Self-pay | Admitting: Internal Medicine

## 2018-06-29 ENCOUNTER — Ambulatory Visit
Admission: RE | Admit: 2018-06-29 | Discharge: 2018-06-29 | Disposition: A | Discharge status: Home / Self Care | Payer: Medicare Other | Source: Ambulatory Visit | Attending: Internal Medicine | Admitting: Internal Medicine

## 2018-06-29 DIAGNOSIS — I481 Persistent atrial fibrillation: Secondary | ICD-10-CM | POA: Diagnosis not present

## 2018-06-29 DIAGNOSIS — M25552 Pain in left hip: Secondary | ICD-10-CM | POA: Diagnosis not present

## 2018-06-29 DIAGNOSIS — R911 Solitary pulmonary nodule: Secondary | ICD-10-CM | POA: Diagnosis not present

## 2018-06-29 DIAGNOSIS — E440 Moderate protein-calorie malnutrition: Secondary | ICD-10-CM | POA: Diagnosis not present

## 2018-06-29 DIAGNOSIS — R509 Fever, unspecified: Secondary | ICD-10-CM | POA: Diagnosis not present

## 2018-06-29 DIAGNOSIS — M5417 Radiculopathy, lumbosacral region: Secondary | ICD-10-CM | POA: Diagnosis not present

## 2018-07-03 ENCOUNTER — Inpatient Hospital Stay
Admission: RE | Admit: 2018-07-03 | Discharge: 2018-07-03 | Disposition: A | Discharge status: Home / Self Care | Payer: Medicare Other | Source: Ambulatory Visit | Attending: Internal Medicine | Admitting: Internal Medicine

## 2018-07-09 ENCOUNTER — Ambulatory Visit
Admission: RE | Admit: 2018-07-09 | Discharge: 2018-07-09 | Disposition: A | Discharge status: Home / Self Care | Payer: Medicare Other | Source: Ambulatory Visit | Attending: Internal Medicine | Admitting: Internal Medicine

## 2018-07-09 DIAGNOSIS — R911 Solitary pulmonary nodule: Secondary | ICD-10-CM | POA: Diagnosis not present

## 2018-07-13 ENCOUNTER — Telehealth: Payer: Self-pay | Admitting: Interventional Cardiology

## 2018-07-13 NOTE — Telephone Encounter (Signed)
New Message:     Pt c/o medication issue:  1. Name of Medication: ELIQUIS 2.5 MG TABS tablet  2. How are you currently taking this medication (dosage and times per day)?  TAKE 1 TABLET (2.5 MG TOTAL) BY MOUTH 2 (TWO) TIMES DAILY.  3. Are you having a reaction (difficulty breathing--STAT)? No   4. What is your medication issue? Patient stated the medication is causing her to bleed from the nose

## 2018-07-13 NOTE — Telephone Encounter (Signed)
Patient stated she had a nose bleed yesterday that took about 45 minutes to stop. Patient stated she had traces of blood a couple of times after the nose bleed, but now she is back to normal. Patient is concerned. Informed patient that sometime during change of seasons the nose can get dry and might cause a bleed. 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 Patient is concerned about taking eliquis, but she knows she needs to stay on it to prevent another stroke. Informed patient that a message would be sent to Dr. [name] for advisement.

## 2018-07-14 NOTE — Telephone Encounter (Signed)
I agree with instructions.  Hopefully she can stay on the medication as it is providing stroke protection.

## 2018-07-14 NOTE — Telephone Encounter (Signed)
Called patient with Dr. Thompson Caul message. Patient verbalized understanding.

## 2018-07-29 ENCOUNTER — Other Ambulatory Visit: Payer: Medicare Other

## 2018-07-29 ENCOUNTER — Ambulatory Visit (INDEPENDENT_AMBULATORY_CARE_PROVIDER_SITE_OTHER): Payer: Medicare Other | Admitting: Pulmonary Disease

## 2018-07-29 ENCOUNTER — Encounter: Payer: Self-pay | Admitting: Pulmonary Disease

## 2018-07-29 DIAGNOSIS — R918 Other nonspecific abnormal finding of lung field: Secondary | ICD-10-CM

## 2018-07-29 NOTE — Patient Instructions (Addendum)
We will check some labs today including ANA, rheumatoid factor, CCP, IgE Return to clinic in 1 to 2 months with full pulmonary function test He will need a follow-up CT without contrast in 1 year for monitoring the lung nodule
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
## 2018-07-30 LAB — ANA, IFA COMPREHENSIVE PANEL
Anti Nuclear Antibody(ANA): NEGATIVE
DS DNA AB: 2 [IU]/mL
ENA SM AB SER-ACNC: NEGATIVE AI
SCLERODERMA (SCL-70) (ENA) ANTIBODY, IGG: NEGATIVE AI
SM/RNP: <1 AI
SSA (RO) (ENA) ANTIBODY, IGG: NEGATIVE AI
SSB (LA) (ENA) ANTIBODY, IGG: NEGATIVE AI

## 2018-07-30 LAB — RHEUMATOID FACTOR: Rhuematoid fact SerPl-aCnc: 19 IU/mL — ABNORMAL HIGH (ref <14)

## 2018-07-30 LAB — CYCLIC CITRUL PEPTIDE ANTIBODY, IGG: Cyclic Citrullin Peptide Ab: <16 UNITS

## 2018-07-30 LAB — IGE: IGE (IMMUNOGLOBULIN E), SERUM: 18 kU/L (ref <=114)

## 2018-08-03 ENCOUNTER — Other Ambulatory Visit: Payer: Self-pay | Admitting: Pharmacist

## 2018-08-03 MED ORDER — APIXABAN 2.5 MG PO TABS: 2.5000 mg | ORAL_TABLET | Freq: Two times a day (BID) | ORAL | 1 refills | Status: DC

## 2018-08-10 ENCOUNTER — Telehealth: Payer: Self-pay | Admitting: Interventional Cardiology

## 2018-08-10 NOTE — Telephone Encounter (Signed)
New Message:    Patient not feeling well would like a early appt.

## 2018-08-10 NOTE — Telephone Encounter (Signed)
Patient did not answer, left voicemail for the patient to call the office.

## 2018-08-14 NOTE — Telephone Encounter (Signed)
Spoke with pt and she states that she has been experiencing more SOB with exertion here lately.  Denies CP.  Has occasional episodes of dizziness.  Resolves with rest after about 30 mins.  States she checked BP yesterday and it was fine but couldn't remember the exact value while on the phone.  Denies any issues currently. Scheduled pt to see Dr. [name] on Monday, 11/4.  Advised to take it easy this weekend and to call the on call with any changes or head to ER for evaluation. Pt verbalized understanding and was in agreement with this plan.
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
## 2018-08-17 ENCOUNTER — Ambulatory Visit (INDEPENDENT_AMBULATORY_CARE_PROVIDER_SITE_OTHER): Payer: Medicare Other | Admitting: Interventional Cardiology

## 2018-08-17 ENCOUNTER — Encounter

## 2018-08-17 ENCOUNTER — Encounter: Payer: Self-pay | Admitting: Interventional Cardiology

## 2018-08-17 VITALS — BP 134/72 | HR 80 | Ht 65.0 in | Wt 109.8 lb

## 2018-08-17 DIAGNOSIS — I10 Essential (primary) hypertension: Secondary | ICD-10-CM | POA: Diagnosis not present

## 2018-08-17 DIAGNOSIS — Z7901 Long term (current) use of anticoagulants: Secondary | ICD-10-CM | POA: Diagnosis not present

## 2018-08-17 DIAGNOSIS — R0602 Shortness of breath: Secondary | ICD-10-CM

## 2018-08-17 DIAGNOSIS — I6389 Other cerebral infarction: Secondary | ICD-10-CM | POA: Diagnosis not present

## 2018-08-17 DIAGNOSIS — I495 Sick sinus syndrome: Secondary | ICD-10-CM

## 2018-08-17 DIAGNOSIS — I482 Chronic atrial fibrillation, unspecified: Secondary | ICD-10-CM

## 2018-08-17 MED ORDER — ISOSORBIDE MONONITRATE ER 30 MG PO TB24: 15.0000 mg | ORAL_TABLET | Freq: Every day | ORAL | 3 refills | Status: DC

## 2018-08-17 NOTE — Patient Instructions (Signed)
Medication Instructions:  Your physician recommends that you continue on your current medications as directed. Please refer to the Current Medication list given to you today.  If you need a refill on your cardiac medications before your next appointment, please call your pharmacy.   Lab work: TODAY: pro-BNP If you have labs (blood work) drawn today and your tests are completely normal, you will receive your results only by: [name] MyChart Message (if you have MyChart) OR . A paper copy in the mail If you have any lab test that is abnormal or we need to change your treatment, we will call you to review the results.  Testing/Procedures: None ordered  Follow-Up: At [facility], you and your health needs are our priority.  As part of our continuing mission to provide you with exceptional heart care, we have created designated Provider Care Teams.  These Care Teams include your primary Cardiologist (physician) and Advanced Practice Providers (APPs -  Physician Assistants and Nurse Practitioners) who all work together to provide you with the care you need, when you need it. . You will need a follow up appointment in 1 year. Please call our office 2 months in advance to schedule this appointment.  You may see [name], MD or one of the following Advanced Practice Providers on your designated Care Team:   . Truitt Merle, NP . Cecilie Kicks, NP . Kathyrn Drown, NP   Any Other Special Instructions Will Be Listed Below (If Applicable).

## 2018-08-18 ENCOUNTER — Other Ambulatory Visit: Payer: Self-pay | Admitting: Interventional Cardiology

## 2018-08-18 LAB — PRO B NATRIURETIC PEPTIDE: NT-Pro BNP: 1102 pg/mL — ABNORMAL HIGH (ref 0–738)

## 2018-08-21 ENCOUNTER — Telehealth: Payer: Self-pay | Admitting: *Deleted

## 2018-08-21 DIAGNOSIS — R0602 Shortness of breath: Secondary | ICD-10-CM

## 2018-08-21 DIAGNOSIS — R7989 Other specified abnormal findings of blood chemistry: Secondary | ICD-10-CM

## 2018-08-21 MED ORDER — SPIRONOLACTONE 25 MG PO TABS: 12.5000 mg | ORAL_TABLET | Freq: Every day | ORAL | 3 refills | Status: DC

## 2018-08-21 NOTE — Telephone Encounter (Signed)
Spoke with Dr. [name] and he said to add Aldactone 12.5mg  QD.  Spoke with pt and went over results and recommendations per Dr. [name]. Pt verbalized understanding and was in agreement with this plan.  Pt will come for repeat labs on 09/01/18.

## 2018-08-21 NOTE — Telephone Encounter (Signed)
-----   Message from Belva Crome, MD sent at 08/18/2018 10:31 PM EST ----- Let the patient know the test suggests fluid could be contributing to SOB. BMET 10 days with repeat BNP. A copy will be sent to [name], MD

## 2018-08-24 ENCOUNTER — Telehealth: Payer: Self-pay | Admitting: Interventional Cardiology

## 2018-08-24 NOTE — Telephone Encounter (Signed)
Spoke with patient who reports taking Spironolactone 12.5 mg Saturday AM as ordered.  In the afternoon she starting feeling very dizzy, lightheaded, out of breath and like she would pass out.  Her BP was 116/63 HR 67. Later her BP was 138/70 HR 78.  Today she did not take the medication (Spironolactone).  BP 149/74 HR 82. She is feeling some better but wonders if Dr [name] wants her to take anything else for the "fluid on her heart"  And also if she still needs to come for the lab next week as scheduled. (BMP and BNP)  Advised I will review with Dr [name] and c/b when any orders.

## 2018-08-24 NOTE — Telephone Encounter (Signed)
New Message   Pt c/o medication issue:  1. Name of Medication: spironolactone (ALDACTONE) 25 MG tablet  2. How are you currently taking this medication (dosage and times per day)? Take 0.5 tablets (12.5 mg total) by mouth daily.  3. Are you having a reaction (difficulty breathing--STAT)? dizziness  4. What is your medication issue? Patient states that she took this medication for the first time on Saturday morning. Since then she has been feeling real dizzy and like she is going to pass out. She has not taken any since that day. Please call to discuss.

## 2018-08-26 NOTE — Telephone Encounter (Signed)
Stop Spironolactone. No lab work next week. No new recommendations.  Current state is the new normal.

## 2018-08-31 NOTE — Telephone Encounter (Signed)
Spoke with pt and went over Dr. Thompson Caul recommendations.  Pt verbalized understanding and was in agreement with this plan.

## 2018-09-01 ENCOUNTER — Other Ambulatory Visit: Payer: Medicare Other

## 2018-09-16 ENCOUNTER — Ambulatory Visit (INDEPENDENT_AMBULATORY_CARE_PROVIDER_SITE_OTHER): Payer: Medicare Other | Admitting: Pulmonary Disease

## 2018-09-16 ENCOUNTER — Ambulatory Visit: Payer: Medicare Other | Admitting: Pulmonary Disease

## 2018-09-16 ENCOUNTER — Encounter: Payer: Self-pay | Admitting: Pulmonary Disease

## 2018-09-16 VITALS — BP 138/78 | HR 90 | Ht 64.0 in | Wt 104.0 lb

## 2018-09-16 DIAGNOSIS — R918 Other nonspecific abnormal finding of lung field: Secondary | ICD-10-CM

## 2018-09-16 DIAGNOSIS — I6389 Other cerebral infarction: Secondary | ICD-10-CM

## 2018-09-16 LAB — PULMONARY FUNCTION TEST
DL/VA % PRED: 73 %
DL/VA: 3.53 ml/min/mmHg/L
DLCO UNC: 14.52 ml/min/mmHg
DLCO unc % pred: 59 %
FEF 25-75 POST: 3.04 L/s
FEF 25-75 Pre: 2.97 L/sec
FEF2575-%Change-Post: 2 %
FEF2575-%PRED-POST: 325 %
FEF2575-%PRED-PRE: 318 %
FEV1-%Change-Post: 0 %
FEV1-%Pred-Post: 122 %
FEV1-%Pred-Pre: 123 %
FEV1-PRE: 1.99 L
FEV1-Post: 1.98 L
FEV1FVC-%CHANGE-POST: 0 %
FEV1FVC-%PRED-PRE: 130 %
FEV6-%CHANGE-POST: 0 %
FEV6-%PRED-PRE: 102 %
FEV6-%Pred-Post: 102 %
FEV6-Post: 2.09 L
FEV6-Pre: 2.1 L
FEV6FVC-%Pred-Post: 107 %
FEV6FVC-%Pred-Pre: 107 %
FVC-%Change-Post: -1 %
FVC-%Pred-Post: 95 %
FVC-%Pred-Pre: 96 %
FVC-Post: 2.09 L
FVC-Pre: 2.12 L
POST FEV6/FVC RATIO: 100 %
PRE FEV1/FVC RATIO: 94 %
Post FEV1/FVC ratio: 94 %
Pre FEV6/FVC Ratio: 100 %
RV % pred: 128 %
RV: 3.29 L
TLC % PRED: 106 %
TLC: 5.39 L

## 2018-09-16 NOTE — Patient Instructions (Signed)
I reviewed the PFTs which not show asthma or COPD. Continue current therapy Will need a follow-up CT in September 2020 for lung nodules. Follow-up in clinic after scan for review.

## 2018-09-16 NOTE — Progress Notes (Signed)
Kaitlin [name]    [id number]    [birth year]  Primary Care Physician:Griffin, Jenny Reichmann, MD  Referring Physician: [name], MD [place] [employer] [address], [place] [address]  Chief complaint: Follow-up for abnormal CT scan, lung nodule  HPI: 84 year old with history of persistent atrial fibrillation, stroke, osteoarthritis, peripheral neuropathy She is being followed by primary care for right upper lobe subcentimeter pulmonary nodule. Post recent CT scan shows persistent lung nodule and subtle right lower lobe groundglass opacities and she is been referred to pulmonary for further evaluation  She is complains of fevers and chills with intermittent symptoms for the past 20 years.  She is evaluated at [facility] with no clear etiology. Noted to have right cervical lymphadenopathy which improved with antibiotics.  Seen by Dr. Lindi Adie, oncology no evidence of lymphoma.  Autoimmune work-up in the past including ANA, SSA, SSB, LDH in 2018 was negative.  CT chest abdomen pelvis showed a 9 mm right upper lobe nodule and nonspecific groundglass opacities. Follow-up CT in September 2019 showed persistent right upper lobe nodule and subtle groundglass changes in the right lower lobe.  Complains of chronic dyspnea on exertion for many years.  Denies any cough, sputum production, chest congestion  Pets: No pets Occupation: Used to work at [employer].  Retired for over 20 years Exposures: No known exposures, no mold, hot tub, Jacuzzi Smoking history: Never smoker Travel history: No significant travel history Relevant family history: No significant family history of lung disease.  Interim history: Continues to have dyspnea on exertion, periods of chronic fatigue that is unchanged from baseline. She is here for review of pulmonary function test.  Outpatient Encounter Medications as of 09/16/2018  Medication Sig  . ALPRAZolam (XANAX) 0.25 MG tablet Take 0.125 mg by mouth 3 (three)  times daily as needed for anxiety. For sleep  . apixaban (ELIQUIS) 2.5 MG TABS tablet Take 1 tablet (2.5 mg total) by mouth 2 (two) times daily.  . B Complex-C (SUPER B COMPLEX) TABS Take 1 tablet by mouth daily.    . cholecalciferol (VITAMIN D) 1000 units tablet Take 1,000 Units by mouth daily.  [name] HYDROcodone-acetaminophen (NORCO/VICODIN) 5-325 MG per tablet Take 1 tablet by mouth every 6 (six) hours as needed for moderate pain.  . nitroGLYCERIN (NITROSTAT) 0.4 MG SL tablet PLACE 1 TABLET UNDER THE TONGUE EVERY 5 MIN FOR 3 DOSES AS NEEDED FOR CHEST PAIN  . Omega-3 Fatty Acids (FISH OIL) 1000 MG CAPS Take 1,000 mg by mouth daily.   [name] PRESCRIPTION MEDICATION Taking Biest 2 mg ML Cream 20/80,  Estriol Micronized 30 ml CREAM. (received from [facility]).  . Probiotic Product (PROBIOTIC DAILY PO) Take 1 capsule by mouth daily.  . [DISCONTINUED] progesterone (PROMETRIUM) 200 MG capsule TAKE 1 CAPSULE BY MOUTH BEFORE BED NIGHTLY   No facility-administered encounter medications on file as of 09/16/2018.    Physical Exam: Blood pressure 138/78, pulse 90, height _0  (1.626 m), weight 104 lb (47.2 kg), SpO2 99 %. Gen:      No acute distress HEENT:  EOMI, sclera anicteric Neck:     No masses; no thyromegaly Lungs:    Clear to auscultation bilaterally; normal respiratory effort CV:         Regular rate and rhythm; no murmurs Abd:      + bowel sounds; soft, non-tender; no palpable masses, no distension Ext:    No edema; adequate peripheral perfusion Skin:  Warm and dry; no rash Neuro: alert and oriented x 3 Psych: normal mood and affect  Data Reviewed: Imaging: CT chest abdomen pelvis 07/24/2017- apical scarring, 9 mm but solid right upper lobe nodule.  Mild bronchiectasis CT chest 07/09/2018- able apical scarring, 8 mm right upper lobe nodule.  Subtle groundglass nodules in the right lower lobe. I have reviewed the images personally.  PFTs: 09/16/2018-FVC 2.09 [95%), FEV1 1.98 [122%),  F/F 94, TLC 106%, DLCO 59%, DLCO/VA 73% Moderate diffusion defect that corrects for alveolar volume.  Labs: Labs from primary care 06/29/2018 WBC 6, eos 1.2%, absolute eosinophil count Complaints of metabolic panel-within normal limits, ESR 11 Urinalysis-normal.  07/14/2017-ANA, SSA, SSB-negative 07/29/2018- ANA IFA, CCP-negative, rheumatoid factor 19 IgE-18  Assessment:  Right upper lobe lung sub solid nodule Has remained stable from 2018 to 2019.  She is scheduled for follow-up CT in Sept 2020.  Abnormal CT CT scan reviewed with mild bronchiectasis and subtle groundglass opacities in the right lower lobe.  I think this is nonspecific.  No clinical evidence of chronic lung infection. Serologies for connective tissue disease are negative.  PFTs reviewed with moderate diffusion defect that corrects for alveolar volume.  There is no clear evidence of interstitial lung disease on imaging.  Health maintenance 10/15/2001-Pneumovax  Plan/Recommendations: - Follow-up CT without contrast in 1 year.  Marshell Garfinkel MD [facility] 09/16/2018, 2:17 PM  CC: [name], MD

## 2018-09-16 NOTE — Progress Notes (Signed)
PFT done today. 

## 2018-10-19 ENCOUNTER — Ambulatory Visit: Payer: Medicare Other | Admitting: Nurse Practitioner

## 2018-10-28 ENCOUNTER — Ambulatory Visit (INDEPENDENT_AMBULATORY_CARE_PROVIDER_SITE_OTHER): Payer: Medicare Other | Admitting: Neurology

## 2018-10-28 ENCOUNTER — Encounter: Payer: Self-pay | Admitting: Neurology

## 2018-10-28 VITALS — BP 162/74 | HR 75 | Ht 65.0 in | Wt 109.0 lb

## 2018-10-28 DIAGNOSIS — R5382 Chronic fatigue, unspecified: Secondary | ICD-10-CM | POA: Diagnosis not present

## 2018-10-28 DIAGNOSIS — G609 Hereditary and idiopathic neuropathy, unspecified: Secondary | ICD-10-CM | POA: Diagnosis not present

## 2018-10-28 DIAGNOSIS — E538 Deficiency of other specified B group vitamins: Secondary | ICD-10-CM

## 2018-10-28 DIAGNOSIS — R1310 Dysphagia, unspecified: Secondary | ICD-10-CM | POA: Diagnosis not present

## 2018-10-28 DIAGNOSIS — R29898 Other symptoms and signs involving the musculoskeletal system: Secondary | ICD-10-CM

## 2018-10-28 DIAGNOSIS — M48062 Spinal stenosis, lumbar region with neurogenic claudication: Secondary | ICD-10-CM | POA: Diagnosis not present

## 2018-10-28 DIAGNOSIS — M4807 Spinal stenosis, lumbosacral region: Secondary | ICD-10-CM | POA: Diagnosis not present

## 2018-10-28 DIAGNOSIS — W19XXXD Unspecified fall, subsequent encounter: Secondary | ICD-10-CM
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
## 2018-10-28 NOTE — Patient Instructions (Signed)
MRI Lumbar spine Repeat Swallow study Bloodwork   Spinal Stenosis  Spinal stenosis occurs when the open space (spinal canal) between the bones of your spine (vertebrae) narrows, putting pressure on the spinal cord or nerves. What are the causes? This condition is caused by areas of bone pushing into the central canals of your vertebrae. This condition may be present at birth (congenital), or it may be caused by:  Arthritic deterioration of your vertebrae (spinal degeneration). This usually starts around age 37.  Injury or trauma to the spine.  Tumors in the spine.  Calcium deposits in the spine. What are the signs or symptoms? Symptoms of this condition include:  Pain in the neck or back that is generally worse with activities, particularly when standing and walking.  Numbness, tingling, hot or cold sensations, weakness, or weariness in your legs.  Pain going up and down the leg (sciatica).  Frequent episodes of falling.  A foot-slapping gait that leads to muscle weakness. In more serious cases, you may develop:  Problemspassing stool or passing urine.  Difficulty having sex.  Loss of feeling in part or all of your leg. Symptoms may come on slowly and get worse over time. How is this diagnosed? This condition is diagnosed based on your medical history and a physical exam. Tests will also be done, such as:  MRI.  CT scan.  X-ray. How is this treated? Treatment for this condition often focuses on managing your pain and any other symptoms. Treatment may include:  Practicing good posture to lessen pressure on your nerves.  Exercising to strengthen muscles, build endurance, improve balance, and maintain good joint movement (range of motion).  Losing weight, if needed.  Taking medicines to reduce swelling, inflammation, or pain.  Assistive devices, such as a corset or brace. In some cases, surgery may be needed. The most common procedure is decompression  laminectomy. This is done to remove excess bone that puts pressure on your nerve roots. Follow these instructions at home: Managing pain, stiffness, and swelling  Do all exercises and stretches as told by your health care provider.  Practice good posture. If you were given a brace or a corset, wear it as told by your health care provider.  Do not do any activities that cause pain. Ask your health care provider what activities are safe for you.  Do not lift anything that is heavier than 10 lb (4.5 kg) or the limit that your health care provider tells you.  Maintain a healthy weight. Talk with your health care provider if you need help losing weight.  If directed, apply heat to the affected area as often as told by your health care provider. Use the heat source that your health care provider recommends, such as a moist heat pack or a heating pad. ? Place a towel between your skin and the heat source. ? Leave the heat on for 20-30 minutes. ? Remove the heat if your skin turns bright red. This is especially important if you are not able to feel pain, heat, or cold. You may have a greater risk of getting burned. General instructions  Take over-the-counter and prescription medicines only as told by your health care provider.  Do not use any products that contain nicotine or tobacco, such as cigarettes and e-cigarettes. If you need help quitting, ask your health care provider.  Eat a healthy diet. This includes plenty of fruits and vegetables, whole grains, and low-fat (lean) protein.  Keep all follow-up visits as told  by your health care provider. This is important. Contact a health care provider if:  Your symptoms do not get better or they get worse.  You have a fever. Get help right away if:  You have new or worse pain in your neck or upper back.  You have severe pain that cannot be controlled with medicines.  You are dizzy.  You have vision problems, blurred vision, or double  vision.  You have a severe headache that is worse when you stand.  You have nausea or you vomit.  You develop new or worse numbness or tingling in your back or legs.  You have pain, redness, swelling, or warmth in your arm or leg. Summary  Spinal stenosis occurs when the open space (spinal canal) between the bones of your spine (vertebrae) narrows. This narrowing puts pressure on the spinal cord or nerves.  Spinal stenosis can cause numbness, weakness, or pain in the neck, back, and legs.  This condition may be caused by a birth defect, arthritic deterioration of your vertebrae, injury, tumors, or calcium deposits.  This condition is usually diagnosed with MRIs, CT scans, and X-rays. This information is not intended to replace advice given to you by your health care provider. Make sure you discuss any questions you have with your health care provider. Document Released: 12/21/2003 Document Revised: 09/04/2016 Document Reviewed: 09/04/2016 Elsevier Interactive Patient Education  2019 [employer].

## 2018-10-29 ENCOUNTER — Encounter: Payer: Self-pay | Admitting: Neurology

## 2018-10-29 DIAGNOSIS — M48062 Spinal stenosis, lumbar region with neurogenic claudication: Secondary | ICD-10-CM | POA: Insufficient documentation

## 2018-10-30 ENCOUNTER — Other Ambulatory Visit (HOSPITAL_COMMUNITY): Payer: Self-pay

## 2018-10-30 DIAGNOSIS — R1310 Dysphagia, unspecified: Secondary | ICD-10-CM

## 2018-11-02 ENCOUNTER — Telehealth: Payer: Self-pay | Admitting: Neurology

## 2018-11-02 LAB — MULTIPLE MYELOMA PANEL, SERUM
ALBUMIN SERPL ELPH-MCNC: 4.4 g/dL (ref 2.9–4.4)
ALPHA 1: 0.2 g/dL (ref 0.0–0.4)
Albumin/Glob SerPl: 1.5 (ref 0.7–1.7)
Alpha2 Glob SerPl Elph-Mcnc: 0.7 g/dL (ref 0.4–1.0)
B-Globulin SerPl Elph-Mcnc: 1 g/dL (ref 0.7–1.3)
Gamma Glob SerPl Elph-Mcnc: 1.2 g/dL (ref 0.4–1.8)
Globulin, Total: 3.1 g/dL (ref 2.2–3.9)
IGA/IMMUNOGLOBULIN A, SERUM: 235 mg/dL (ref 64–422)
IGG (IMMUNOGLOBIN G), SERUM: 1319 mg/dL (ref 700–1600)
IGM (IMMUNOGLOBULIN M), SRM: 118 mg/dL (ref 26–217)
TOTAL PROTEIN: 7.5 g/dL (ref 6.0–8.5)

## 2018-11-02 LAB — B12 AND FOLATE PANEL
Folate: >20 ng/mL (ref >3.0)
Vitamin B-12: 1227 pg/mL (ref 232–1245)

## 2018-11-02 LAB — METHYLMALONIC ACID, SERUM: METHYLMALONIC ACID: 190 nmol/L (ref 0–378)

## 2018-11-02 LAB — TSH: TSH: 3.95 u[IU]/mL (ref 0.450–4.500)

## 2018-11-02 LAB — VITAMIN B6: VITAMIN B6: 41.6 ug/L — AB (ref 2.0–32.8)

## 2018-11-02 LAB — VITAMIN B1: Thiamine: 214.8 nmol/L — ABNORMAL HIGH (ref 66.5–200.0)

## 2018-11-02 NOTE — Telephone Encounter (Signed)
Medicare/BCBS no auth per [employer] order sent to GI spoke to [place] pt daughter on the dpr she is aware.

## 2018-11-09 ENCOUNTER — Ambulatory Visit: Payer: Medicare Other | Admitting: Interventional Cardiology

## 2018-11-10 ENCOUNTER — Ambulatory Visit
Admission: RE | Admit: 2018-11-10 | Discharge: 2018-11-10 | Disposition: A | Discharge status: Home / Self Care | Payer: Medicare Other | Source: Ambulatory Visit | Attending: Neurology | Admitting: Neurology

## 2018-11-10 DIAGNOSIS — M48061 Spinal stenosis, lumbar region without neurogenic claudication: Secondary | ICD-10-CM | POA: Diagnosis not present

## 2018-11-10 DIAGNOSIS — W19XXXD Unspecified fall, subsequent encounter: Secondary | ICD-10-CM

## 2018-11-10 DIAGNOSIS — R29898 Other symptoms and signs involving the musculoskeletal system: Secondary | ICD-10-CM

## 2018-11-10 DIAGNOSIS — M4807 Spinal stenosis, lumbosacral region: Secondary | ICD-10-CM | POA: Diagnosis not present

## 2018-11-10 DIAGNOSIS — M48062 Spinal stenosis, lumbar region with neurogenic claudication: Secondary | ICD-10-CM

## 2018-11-16 ENCOUNTER — Ambulatory Visit: Payer: Medicare Other | Admitting: Nurse Practitioner

## 2018-11-17 ENCOUNTER — Encounter (HOSPITAL_COMMUNITY): Payer: Medicare Other

## 2018-11-25 ENCOUNTER — Encounter: Payer: Self-pay | Admitting: *Deleted

## 2018-11-25 ENCOUNTER — Telehealth: Payer: Self-pay | Admitting: Neurology

## 2018-11-25 DIAGNOSIS — H401131 Primary open-angle glaucoma, bilateral, mild stage: Secondary | ICD-10-CM | POA: Diagnosis not present

## 2018-11-25 DIAGNOSIS — H04123 Dry eye syndrome of bilateral lacrimal glands: Secondary | ICD-10-CM | POA: Diagnosis not present

## 2018-11-25 DIAGNOSIS — H43393 Other vitreous opacities, bilateral: Secondary | ICD-10-CM | POA: Diagnosis not present

## 2018-11-25 DIAGNOSIS — H532 Diplopia: Secondary | ICD-10-CM | POA: Diagnosis not present

## 2018-11-25 DIAGNOSIS — H353131 Nonexudative age-related macular degeneration, bilateral, early dry stage: Secondary | ICD-10-CM | POA: Diagnosis not present

## 2018-11-25 NOTE — Telephone Encounter (Signed)
Pt is calling for her Lab and MRI results. She is also wanting a copy of them. Please advise.

## 2018-11-25 NOTE — Telephone Encounter (Signed)
Messaged pt through [employer]. The labs & MRI were both sent to pt's mychart by Dr. Jaynee Eagles.

## 2019-02-05 ENCOUNTER — Other Ambulatory Visit: Payer: Self-pay | Admitting: Pharmacist

## 2019-02-05 MED ORDER — APIXABAN 2.5 MG PO TABS: 2.5000 mg | ORAL_TABLET | Freq: Two times a day (BID) | ORAL | 1 refills | Status: DC

## 2019-03-22 DIAGNOSIS — M5442 Lumbago with sciatica, left side: Secondary | ICD-10-CM | POA: Diagnosis not present

## 2019-03-22 DIAGNOSIS — E440 Moderate protein-calorie malnutrition: Secondary | ICD-10-CM | POA: Diagnosis not present

## 2019-03-22 DIAGNOSIS — G8929 Other chronic pain: Secondary | ICD-10-CM | POA: Diagnosis not present

## 2019-03-22 DIAGNOSIS — Z1389 Encounter for screening for other disorder: Secondary | ICD-10-CM | POA: Diagnosis not present

## 2019-03-22 DIAGNOSIS — I4819 Other persistent atrial fibrillation: Secondary | ICD-10-CM | POA: Diagnosis not present

## 2019-03-22 DIAGNOSIS — F419 Anxiety disorder, unspecified: Secondary | ICD-10-CM | POA: Diagnosis not present

## 2019-03-22 DIAGNOSIS — Z23 Encounter for immunization: Secondary | ICD-10-CM | POA: Diagnosis not present

## 2019-03-22 DIAGNOSIS — R509 Fever, unspecified: Secondary | ICD-10-CM | POA: Diagnosis not present

## 2019-03-22 DIAGNOSIS — G609 Hereditary and idiopathic neuropathy, unspecified: Secondary | ICD-10-CM | POA: Diagnosis not present

## 2019-03-22 DIAGNOSIS — Z0000 Encounter for general adult medical examination without abnormal findings: Secondary | ICD-10-CM | POA: Diagnosis not present

## 2019-05-11 ENCOUNTER — Telehealth: Payer: Self-pay | Admitting: Internal Medicine

## 2019-05-11 NOTE — Telephone Encounter (Signed)
New Message         COVID-19 Pre-Screening Questions:   In the past 7 to 10 days have you had a cough,  shortness of breath, headache, congestion, fever (100 or greater) body aches, chills, sore throat, or sudden loss of taste or sense of smell? Pt has a history of fevers and chills  But nothing new going on   Have you been around anyone with known Covid 19. NO  Have you been around anyone who is awaiting Covid 19 test results in the past 7 to 10 days?  NO  Have you been around anyone who has been exposed to Covid 19, or has mentioned symptoms of Covid 19 within the past 7 to 10 days? NO Pts daughter answered NO to all screening questions, she says she  Is the one assisting her to the appt   If you have any concerns/questions about symptoms patients report during screening (either on the phone or at threshold). Contact the provider seeing the patient or DOD for further guidance.  If neither are available contact a member of the leadership team.

## 2019-05-12 ENCOUNTER — Other Ambulatory Visit: Payer: Self-pay

## 2019-05-12 ENCOUNTER — Ambulatory Visit (INDEPENDENT_AMBULATORY_CARE_PROVIDER_SITE_OTHER): Payer: Medicare Other | Admitting: Internal Medicine

## 2019-05-12 ENCOUNTER — Encounter: Payer: Self-pay | Admitting: Internal Medicine

## 2019-05-12 VITALS — BP 130/74 | HR 89 | Ht 65.0 in | Wt 104.6 lb

## 2019-05-12 DIAGNOSIS — R002 Palpitations: Secondary | ICD-10-CM | POA: Diagnosis not present

## 2019-05-12 DIAGNOSIS — I482 Chronic atrial fibrillation, unspecified: Secondary | ICD-10-CM

## 2019-05-12 DIAGNOSIS — I495 Sick sinus syndrome: Secondary | ICD-10-CM
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
## 2019-05-12 NOTE — Patient Instructions (Signed)
Medication Instructions:  Your physician recommends that you continue on your current medications as directed. Please refer to the Current Medication list given to you today.  Labwork: You will have labs drawn today: CBC and BMP  Testing/Procedures: None ordered.  Follow-Up: Your physician recommends that you schedule a follow-up appointment in:   12 months with Dr. Caryl Comes   Any Other Special Instructions Will Be Listed Below (If Applicable).     If you need a refill on your cardiac medications before your next appointment, please call your pharmacy.

## 2019-05-17 ENCOUNTER — Other Ambulatory Visit: Payer: Self-pay

## 2019-05-17 ENCOUNTER — Other Ambulatory Visit: Payer: Medicare Other | Admitting: *Deleted

## 2019-05-17 DIAGNOSIS — R002 Palpitations: Secondary | ICD-10-CM

## 2019-05-17 DIAGNOSIS — I495 Sick sinus syndrome: Secondary | ICD-10-CM

## 2019-05-17 DIAGNOSIS — I482 Chronic atrial fibrillation, unspecified: Secondary | ICD-10-CM

## 2019-05-18 LAB — CBC
Hematocrit: 43.5 % (ref 34.0–46.6)
Hemoglobin: 15.1 g/dL (ref 11.1–15.9)
MCH: 31.1 pg (ref 26.6–33.0)
MCHC: 34.7 g/dL (ref 31.5–35.7)
MCV: 90 fL (ref 79–97)
Platelets: 176 10*3/uL (ref 150–450)
RBC: 4.86 x10E6/uL (ref 3.77–5.28)
RDW: 12.8 % (ref 11.7–15.4)
WBC: 5.1 10*3/uL (ref 3.4–10.8)

## 2019-05-18 LAB — BASIC METABOLIC PANEL
BUN/Creatinine Ratio: 17 (ref 12–28)
BUN: 15 mg/dL (ref 8–27)
CO2: 25 mmol/L (ref 20–29)
Calcium: 9.4 mg/dL (ref 8.7–10.3)
Chloride: 102 mmol/L (ref 96–106)
Creatinine, Ser: 0.88 mg/dL (ref 0.57–1.00)
GFR calc Af Amer: 67 mL/min/{1.73_m2} (ref >59)
GFR calc non Af Amer: 58 mL/min/{1.73_m2} — ABNORMAL LOW (ref >59)
Glucose: 75 mg/dL (ref 65–99)
Potassium: 4.4 mmol/L (ref 3.5–5.2)
Sodium: 143 mmol/L (ref 134–144)

## 2019-06-14 ENCOUNTER — Encounter: Payer: Self-pay | Admitting: Internal Medicine

## 2019-06-14 ENCOUNTER — Ambulatory Visit (INDEPENDENT_AMBULATORY_CARE_PROVIDER_SITE_OTHER): Payer: BLUE CROSS/BLUE SHIELD | Admitting: Internal Medicine

## 2019-06-14 VITALS — BP 132/66 | HR 70 | Temp 97.4°F | Ht 65.0 in | Wt 104.0 lb

## 2019-06-14 DIAGNOSIS — Q453 Other congenital malformations of pancreas and pancreatic duct: Secondary | ICD-10-CM | POA: Diagnosis not present

## 2019-06-14 DIAGNOSIS — R1011 Right upper quadrant pain: Secondary | ICD-10-CM | POA: Diagnosis not present

## 2019-06-14 DIAGNOSIS — R1012 Left upper quadrant pain: Secondary | ICD-10-CM

## 2019-06-14 DIAGNOSIS — G8929 Other chronic pain: Secondary | ICD-10-CM

## 2019-06-14 DIAGNOSIS — R634 Abnormal weight loss: Secondary | ICD-10-CM

## 2019-06-14 DIAGNOSIS — R509 Fever, unspecified: Secondary | ICD-10-CM | POA: Diagnosis not present

## 2019-06-14 NOTE — Patient Instructions (Signed)
You have been scheduled for a CT scan of the abdomen and pelvis at [facility] ([address]---this is in the same building as Press [employer]).   You are scheduled on 07/06/2019 at 3:45pm. You should arrive 15 minutes prior to your appointment time for registration. Please follow the written instructions below on the day of your exam:  WARNING: IF YOU ARE ALLERGIC TO IODINE/X-RAY DYE, PLEASE NOTIFY RADIOLOGY IMMEDIATELY AT [phone]! YOU WILL BE GIVEN A 13 HOUR PREMEDICATION PREP.  1) Do not eat or drink anything after 11:45AM (4 hours prior to your test) 2) You have been given 2 bottles of oral contrast to drink. The solution may taste better if refrigerated, but do NOT add ice or any other liquid to this solution. Shake well before drinking.    Drink 1 bottle of contrast @ 1:45pm (2 hours prior to your exam)  Drink 1 bottle of contrast @ 2:45pm (1 hour prior to your exam)  You may take any medications as prescribed with a small amount of water, if necessary. If you take any of the following medications: METFORMIN, GLUCOPHAGE, GLUCOVANCE, AVANDAMET, RIOMET, FORTAMET, [place] MET, JANUMET, GLUMETZA or METAGLIP, you MAY be asked to HOLD this medication 48 hours AFTER the exam.  The purpose of you drinking the oral contrast is to aid in the visualization of your intestinal tract. The contrast solution may cause some diarrhea. Depending on your individual set of symptoms, you may also receive an intravenous injection of x-ray contrast/dye. Plan on being at [facility] for 30 minutes or longer, depending on the type of exam you are having performed.  This test typically takes 30-45 minutes to complete.  If you have any questions regarding your exam or if you need to reschedule, you may call the CT department at [phone] between the hours of 8:00 am and 5:00 pm, Monday-Friday.  ________________________________________________________________________   Your provider  has requested that you go to the basement level for lab work before leaving today. Press "B" on the elevator. The lab is located at the first door on the left as you exit the elevator.  I appreciate the opportunity to care for you. Silvano Rusk, MD, [facility]
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
## 2019-06-25 ENCOUNTER — Other Ambulatory Visit (INDEPENDENT_AMBULATORY_CARE_PROVIDER_SITE_OTHER): Payer: BLUE CROSS/BLUE SHIELD

## 2019-06-25 ENCOUNTER — Telehealth: Payer: Self-pay | Admitting: Pulmonary Disease

## 2019-06-25 DIAGNOSIS — R1012 Left upper quadrant pain: Secondary | ICD-10-CM

## 2019-06-25 DIAGNOSIS — R1011 Right upper quadrant pain: Secondary | ICD-10-CM

## 2019-06-25 DIAGNOSIS — R509 Fever, unspecified: Secondary | ICD-10-CM

## 2019-06-25 DIAGNOSIS — G8929 Other chronic pain: Secondary | ICD-10-CM

## 2019-06-25 DIAGNOSIS — R634 Abnormal weight loss: Secondary | ICD-10-CM | POA: Diagnosis not present

## 2019-06-25 DIAGNOSIS — Q453 Other congenital malformations of pancreas and pancreatic duct: Secondary | ICD-10-CM | POA: Diagnosis not present

## 2019-06-25 LAB — BASIC METABOLIC PANEL
BUN: 21 mg/dL (ref 6–23)
CO2: 31 mEq/L (ref 19–32)
Calcium: 9.8 mg/dL (ref 8.4–10.5)
Chloride: 101 mEq/L (ref 96–112)
Creatinine, Ser: 1 mg/dL (ref 0.40–1.20)
GFR: 52.11 mL/min — ABNORMAL LOW (ref >60.00)
Glucose, Bld: 147 mg/dL — ABNORMAL HIGH (ref 70–99)
Potassium: 3.8 mEq/L (ref 3.5–5.1)
Sodium: 141 mEq/L (ref 135–145)

## 2019-06-25 NOTE — Telephone Encounter (Signed)
Spoke with pt, she wanted to know who ordered the CT chest and I advised her that Dr. Vaughan Browner ordered this to evaluate her lung nodules. Pt understood and nothing further is needed.    Patient Instructions by Marshell Garfinkel, MD at 09/16/2018 2:00 PM Author: Marshell Garfinkel, MD Author Type: Physician Filed: 09/16/2018 2:39 PM  Note Status: Signed Cosign: Cosign Not Required Encounter Date: 09/16/2018  Editor: Marshell Garfinkel, MD (Physician)    I reviewed the PFTs which not show asthma or COPD. Continue current therapy Will need a follow-up CT in September 2020 for lung nodules. Follow-up in clinic after scan for review.    Instructions  I reviewed the PFTs which not show asthma or COPD. Continue current therapy Will need a follow-up CT in September 2020 for lung nodules. Follow-up in clinic after scan for review.       After Visit Summary (Printed 09/16/2018)

## 2019-07-05 ENCOUNTER — Telehealth: Payer: Self-pay

## 2019-07-05 NOTE — Telephone Encounter (Signed)
Dr Carlean Purl, this patient is scheduled for a CT tomorrow. She is wanting to check with you about proceeding. She indicates to me that she has had chills and a low grade fever, but feels better today. She is however concerned about her AFIB and having to have contrast, oral and IV. Please advise

## 2019-07-05 NOTE — Telephone Encounter (Signed)
Contrast is not a problem regarding those issues and I recommend she have the CT

## 2019-07-05 NOTE — Telephone Encounter (Signed)
Pt. was advised to proceed with the CT scan. Her daughter Peter Congo was with her when I called. She is worried about her mom and thinks she may have had a stroke. I advised Peter Congo to take her mom to the ER if she truly thinks that something is wrong for evaluation.

## 2019-07-06 ENCOUNTER — Other Ambulatory Visit: Payer: Self-pay

## 2019-07-06 ENCOUNTER — Ambulatory Visit (INDEPENDENT_AMBULATORY_CARE_PROVIDER_SITE_OTHER)
Admission: RE | Admit: 2019-07-06 | Discharge: 2019-07-06 | Disposition: A | Discharge status: Home / Self Care | Payer: BLUE CROSS/BLUE SHIELD | Source: Ambulatory Visit | Attending: Internal Medicine | Admitting: Internal Medicine

## 2019-07-06 DIAGNOSIS — R1012 Left upper quadrant pain: Secondary | ICD-10-CM

## 2019-07-06 DIAGNOSIS — R1011 Right upper quadrant pain: Secondary | ICD-10-CM | POA: Diagnosis not present

## 2019-07-06 DIAGNOSIS — K8689 Other specified diseases of pancreas: Secondary | ICD-10-CM | POA: Diagnosis not present

## 2019-07-06 DIAGNOSIS — Q453 Other congenital malformations of pancreas and pancreatic duct: Secondary | ICD-10-CM

## 2019-07-06 DIAGNOSIS — R634 Abnormal weight loss: Secondary | ICD-10-CM

## 2019-07-06 DIAGNOSIS — R509 Fever, unspecified: Secondary | ICD-10-CM

## 2019-07-06 DIAGNOSIS — G8929 Other chronic pain: Secondary | ICD-10-CM

## 2019-07-06 DIAGNOSIS — K838 Other specified diseases of biliary tract: Secondary | ICD-10-CM | POA: Diagnosis not present

## 2019-07-06 MED ORDER — IOHEXOL 300 MG/ML  SOLN
80.0000 mL | Freq: Once | INTRAMUSCULAR | Status: AC | PRN
  Administered 2019-07-06: 16:00:00 80 mL via INTRAVENOUS

## 2019-07-07 NOTE — Progress Notes (Signed)
Please explain to the patient that the CT scan shows the dilated bile duct as in the past. No liver, bile duct or intestinal or pancreas tumors. Kidneys ok also.  She has the lung nodules - this exam shows the lower lungs  It will be important to do the chest CT Dr. Vaughan Browner has her scheduled for in October.   I have a couple of ideas about what we might do - I will call her but I might not be able to until next week.

## 2019-07-13 ENCOUNTER — Telehealth: Payer: Self-pay | Admitting: Internal Medicine

## 2019-07-13 NOTE — Progress Notes (Signed)
Reviewed results with patient Still having fevers and abd pain as over past 2 decades Rheumatologic studies, imaging, infectious w/u all negative multiple times ? If she has some sort of seronegative autoimmune disease - very strange but nothing bad ever seen on testing   I think a trial of prednisone may be useful but she was told by cardiology not to take aspirin or prednisone - I will double check that

## 2019-07-13 NOTE — Telephone Encounter (Signed)
Please tell her I will call late this PM after office over

## 2019-07-13 NOTE — Telephone Encounter (Signed)
Spoke with pt regarding CT result, see result note. Pt states she has a lot of questions she would like to ask Dr. Carlean Purl when he calls.

## 2019-07-13 NOTE — Telephone Encounter (Signed)
Pt aware.

## 2019-07-23 ENCOUNTER — Other Ambulatory Visit: Payer: Self-pay | Admitting: Internal Medicine

## 2019-07-23 MED ORDER — PREDNISONE 5 MG PO TABS: 10.0000 mg | ORAL_TABLET | Freq: Every day | ORAL | 1 refills | Status: DC

## 2019-07-23 NOTE — Progress Notes (Signed)
I called her and spoke to her with her daughter Peter Congo  Will try empiric prednisone 10 mg qd - ? If there is some chronic inflammatory condition we have not been able to codify  I sent in rx but I want her scheduled for an appointment in about 1 month please so we need to arrange that

## 2019-07-26 DIAGNOSIS — H43393 Other vitreous opacities, bilateral: Secondary | ICD-10-CM | POA: Diagnosis not present

## 2019-07-26 DIAGNOSIS — H401131 Primary open-angle glaucoma, bilateral, mild stage: Secondary | ICD-10-CM | POA: Diagnosis not present

## 2019-07-26 DIAGNOSIS — H532 Diplopia: Secondary | ICD-10-CM | POA: Diagnosis not present

## 2019-07-26 DIAGNOSIS — H04123 Dry eye syndrome of bilateral lacrimal glands: Secondary | ICD-10-CM | POA: Diagnosis not present

## 2019-07-26 DIAGNOSIS — H353131 Nonexudative age-related macular degeneration, bilateral, early dry stage: Secondary | ICD-10-CM | POA: Diagnosis not present

## 2019-08-02 ENCOUNTER — Other Ambulatory Visit: Payer: Self-pay

## 2019-08-02 ENCOUNTER — Ambulatory Visit
Admission: RE | Admit: 2019-08-02 | Discharge: 2019-08-02 | Disposition: A | Discharge status: Home / Self Care | Payer: Medicare Other | Source: Ambulatory Visit | Attending: Pulmonary Disease | Admitting: Pulmonary Disease

## 2019-08-02 DIAGNOSIS — R918 Other nonspecific abnormal finding of lung field: Secondary | ICD-10-CM | POA: Diagnosis not present

## 2019-08-03 ENCOUNTER — Telehealth: Payer: Self-pay | Admitting: Interventional Cardiology

## 2019-08-03 NOTE — Telephone Encounter (Signed)
Daughter calling because they received letter that it was time for pt's appt but no availability until January but she felt pt needed to be seen sooner.  Pt has had increased palps that daughter says she describes as "my heart is double clutching".  SOB whether active or sitting.  Weight is fluctuating 108-102-98-104.  Pt has no appetite.  Weight loss and appetite have been mentioned to PCP.  BP usually ok but has episodes where BP will get really high but this normally only lasts a day and then goes back to normal.  Offered appt with PA tomorrow but daughter declined stating that pt would want to see Dr. [name].  Scheduled pt 11/6.  Will route to Dr. [name] to see if any testing needs to be done prior to appt?

## 2019-08-03 NOTE — Telephone Encounter (Signed)
New message   Pt c/o Shortness Of Breath: STAT if SOB developed within the last 24 hours or pt is noticeably SOB on the phone  1. Are you currently SOB (can you hear that pt is SOB on the phone)? No   2. How long have you been experiencing SOB? Per patient's  has had sob for a while   3. Are you SOB when sitting or when up moving around?both   4. Are you currently experiencing any other symptoms?palpitations, lost weight, no appetite

## 2019-08-04 ENCOUNTER — Ambulatory Visit (INDEPENDENT_AMBULATORY_CARE_PROVIDER_SITE_OTHER): Payer: BLUE CROSS/BLUE SHIELD | Admitting: Pulmonary Disease

## 2019-08-04 ENCOUNTER — Other Ambulatory Visit: Payer: Self-pay

## 2019-08-04 ENCOUNTER — Encounter: Payer: Self-pay | Admitting: Pulmonary Disease

## 2019-08-04 DIAGNOSIS — R918 Other nonspecific abnormal finding of lung field: Secondary | ICD-10-CM | POA: Diagnosis not present

## 2019-08-04 MED ORDER — APIXABAN 2.5 MG PO TABS: 2.5000 mg | ORAL_TABLET | Freq: Two times a day (BID) | ORAL | 1 refills | Status: DC

## 2019-08-04 NOTE — Telephone Encounter (Signed)
Pt last saw Dr Caryl Comes 05/12/19, last labs 06/25/19 Creat 1.00, age 85, weight 47.2kg, based on specified criteria pt is on appropriate dosage of Eliquis 2.5mg  BID.  Will refill rx.

## 2019-08-04 NOTE — Progress Notes (Signed)
Virtual Visit via Telephone Note  I connected with Kaitlin [name] on 08/04/19 at 10:30 AM EDT by telephone and verified that I am speaking with the correct person using two identifiers.  Location: Patient: Home Provider: [facility], [place], [place]   I discussed the limitations, risks, security and privacy concerns of performing an evaluation and management service by telephone and the availability of in person appointments. I also discussed with the patient that there may be a patient responsible charge related to this service. The patient expressed understanding and agreed to proceed.   History of Present Illness: Chief complaint: Follow-up for abnormal CT scan, lung nodule  HPI: 85 year old with history of persistent atrial fibrillation, stroke, osteoarthritis, peripheral neuropathy She is being followed by primary care for right upper lobe subcentimeter pulmonary nodule. Post recent CT scan shows persistent lung nodule and subtle right lower lobe groundglass opacities and she is been referred to pulmonary for further evaluation  She is complains of fevers and chills with intermittent symptoms for the past 20 years.  She is evaluated at [facility] with no clear etiology. Noted to have right cervical lymphadenopathy which improved with antibiotics.  Seen by Dr. Lindi Adie, oncology no evidence of lymphoma.  Autoimmune work-up in the past including ANA, SSA, SSB, LDH in 2018 was negative.  CT chest abdomen pelvis showed a 9 mm right upper lobe nodule and nonspecific groundglass opacities. Follow-up CT in September 2019 showed persistent right upper lobe nodule and subtle groundglass changes in the right lower lobe.  Pets: No pets Occupation: Used to work at [employer].  Retired for over 20 years Exposures: No known exposures, no mold, hot tub, Jacuzzi Smoking history: Never smoker Travel history: No significant travel history Relevant family history: No significant  family history of lung disease.  Interim history: Has ongoing issues with fevers, chills.  Recent work-up including evaluation by Dr. Carlean Purl.  Started on prednisone 10 mg daily in early oct for possible unidentified rheumatologic condition.  She recently had a CT chest and is here for review   Observations/Objective: Imaging: CT chest abdomen pelvis 07/24/2017- apical scarring, 9 mm but solid right upper lobe nodule.  Mild bronchiectasis  CT chest 07/09/2018- sable apical scarring, 8 mm right upper lobe nodule.  Subtle groundglass nodules in the right lower lobe.  CT chest 08/02/2019-stable subcentimeter pulmonary nodule with mild bronchiectatic changes.  Cardiomegaly with pulmonary vascular congestion.  CT abdomen pelvis 07/06/2019-status post cholecystectomy with unchanged intrahepatic and common bile duct dilatation. I have reviewed the images personally.  PFTs: 09/16/2018-FVC 2.09 [95%), FEV1 1.98 [122%), F/F 94, TLC 106%, DLCO 59%, DLCO/VA 73% Moderate diffusion defect that corrects for alveolar volume.  Labs: Labs from primary care 06/29/2018 WBC 6, eos 1.2%, absolute eosinophil count Comprehensive metabolic panel-within normal limits, ESR 11 Urinalysis-normal.  07/14/2017-ANA, SSA, SSB-negative 07/29/2018- ANA IFA, CCP-negative, rheumatoid factor 19 IgE-18  Assessment and Plan: Pulmonary nodules Has remained stable on follow-up CT imaging She does have mild bronchiectasis which is nonspecific.  No pulmonary findings to explain her ongoing issues with recurrent fevers with no evidence of lung infection. Serologies for connective tissue disease are negative.  PFTs reviewed with moderate diffusion defect that corrects for alveolar volume.  There is no clear evidence of interstitial lung disease on imaging.  The most recent CT does show cardiomegaly with vascular congestion.  She has follow-up with Dr. [name], cardiology soon and will discuss this with her.  Reviewed  imaging and plan with patient's son  and daughter over telephone  Follow Up Instructions: - Follow-up CT without contrast in 1 year.   I discussed the assessment and treatment plan with the patient. The patient was provided an opportunity to ask questions and all were answered. The patient agreed with the plan and demonstrated an understanding of the instructions.   The patient was advised to call back or seek an in-person evaluation if the symptoms worsen or if the condition fails to improve as anticipated.  I provided 25 minutes of non-face-to-face time during this encounter.   Marshell Garfinkel MD [facility] 08/04/2019, 10:40 AM

## 2019-08-04 NOTE — Addendum Note (Signed)
Addended by: Brynda Peon on: 08/04/2019 08:13 AM   Modules accepted: Orders

## 2019-08-04 NOTE — Telephone Encounter (Signed)
No tests

## 2019-08-10 ENCOUNTER — Telehealth: Payer: Self-pay | Admitting: Internal Medicine

## 2019-08-10 NOTE — Telephone Encounter (Signed)
Message was routed to our office [facility]) incorrectly. Can you help with this?

## 2019-08-10 NOTE — Telephone Encounter (Signed)
Patient has been on predniSONE (DELTASONE) 5 MG tablet  For some time and she doesn't think its working. She been having some dizzy spells and wants to know if she should still be taking it or can she prescribed another medication .Please advise    Call back [phone]

## 2019-08-10 NOTE — Telephone Encounter (Signed)
She is not helped by the prednisone.  She will taper it take 5 mg daily starting tomorrow until Saturday and then quit.  She may not need a taper but we are going to do that just to be on the safe side.  She continues to complain of abdominal pain but she also has the situation where she wakes up at night and her legs are on fire and it radiates up all the way into her abdominal area and she gets a chill and then she says she gets a fever.  She asks questions about what it might be and I explained that I really do not know but I will think about it and I will also try to speak to her son the pharmacist and see what else we might do.  Perhaps an endoscopic ultrasound makes sense.  However I do not think that has anything to do with i.e. I mean a biliary tract problem would not have anything to do with leg pain and I told her that.

## 2019-08-10 NOTE — Telephone Encounter (Signed)
Patient reports that her symptoms have not improved with the prednisone.  She feels she is getting worse on it.  She is jittery on it as well, stomach discomfort.  Her son advised her to hold it for now.  She would really like to speak with you directly.  She says you know about it and would really like you to call her directly.

## 2019-08-10 NOTE — Telephone Encounter (Signed)
gessner pt.

## 2019-08-19 NOTE — Telephone Encounter (Signed)
I spoke to the son after speaking to her about 5 days apart.  I had a good conversation with both.  Her problems remain a mystery.  I will look over things again and consider whether or not an endoscopic ultrasound would be worthwhile and possible.  Obtaining a unifying diagnosis has been hard for many physicians.  Son mentioned the possibility of Creon because she has some tan-colored loose stools.  Will consider that.
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
## 2019-08-20 ENCOUNTER — Ambulatory Visit: Payer: BLUE CROSS/BLUE SHIELD | Admitting: Interventional Cardiology
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
## 2019-08-26 ENCOUNTER — Ambulatory Visit (INDEPENDENT_AMBULATORY_CARE_PROVIDER_SITE_OTHER): Payer: Medicare Other | Admitting: Interventional Cardiology

## 2019-08-26 ENCOUNTER — Encounter: Payer: Self-pay | Admitting: Interventional Cardiology

## 2019-08-26 ENCOUNTER — Other Ambulatory Visit: Payer: Self-pay

## 2019-08-26 VITALS — BP 144/82 | HR 89 | Ht 65.0 in | Wt 104.8 lb

## 2019-08-26 DIAGNOSIS — R0602 Shortness of breath: Secondary | ICD-10-CM | POA: Diagnosis not present

## 2019-08-26 DIAGNOSIS — Z7189 Other specified counseling: Secondary | ICD-10-CM

## 2019-08-26 DIAGNOSIS — I482 Chronic atrial fibrillation, unspecified: Secondary | ICD-10-CM | POA: Diagnosis not present

## 2019-08-26 DIAGNOSIS — Z7901 Long term (current) use of anticoagulants: Secondary | ICD-10-CM | POA: Diagnosis not present

## 2019-08-26 DIAGNOSIS — I495 Sick sinus syndrome: Secondary | ICD-10-CM

## 2019-08-26 DIAGNOSIS — I6389 Other cerebral infarction: Secondary | ICD-10-CM | POA: Diagnosis not present

## 2019-08-26 MED ORDER — PANCRELIPASE (LIP-PROT-AMYL) 36000-114000 UNITS PO CPEP: ORAL_CAPSULE | ORAL | 0 refills | Status: DC

## 2019-08-26 NOTE — Telephone Encounter (Signed)
We will call patient and ask her to pick up samples of Creon 36 and take 2 with meals and 1 with snacks  Explain to her that after talking with her son I agree that this is worth a try to see if this helps her feel better and that I will discuss more with her when she comes for a follow-up visit

## 2019-08-26 NOTE — Patient Instructions (Signed)

## 2019-08-26 NOTE — Telephone Encounter (Signed)
I spoke with patient's daughter Peter Congo and they will just wait until Mom's visit next week to pick up and discuss the creon samples.

## 2019-08-31 ENCOUNTER — Ambulatory Visit: Payer: BLUE CROSS/BLUE SHIELD | Admitting: Internal Medicine

## 2019-10-18 ENCOUNTER — Other Ambulatory Visit: Payer: Self-pay | Admitting: Interventional Cardiology

## 2019-11-08 ENCOUNTER — Other Ambulatory Visit: Payer: Self-pay | Admitting: Internal Medicine

## 2019-11-08 ENCOUNTER — Ambulatory Visit
Admission: RE | Admit: 2019-11-08 | Discharge: 2019-11-08 | Disposition: A | Discharge status: Home / Self Care | Payer: Medicare Other | Source: Ambulatory Visit | Attending: Internal Medicine | Admitting: Internal Medicine

## 2019-11-08 DIAGNOSIS — R0781 Pleurodynia: Secondary | ICD-10-CM | POA: Diagnosis not present

## 2019-11-08 DIAGNOSIS — R634 Abnormal weight loss: Secondary | ICD-10-CM | POA: Diagnosis not present

## 2019-11-08 DIAGNOSIS — R8290 Unspecified abnormal findings in urine: Secondary | ICD-10-CM | POA: Diagnosis not present

## 2019-11-08 DIAGNOSIS — M2550 Pain in unspecified joint: Secondary | ICD-10-CM | POA: Diagnosis not present

## 2019-11-22 ENCOUNTER — Telehealth: Payer: Self-pay | Admitting: Internal Medicine

## 2019-11-22 NOTE — Telephone Encounter (Signed)
Pt inquired if Creon samples are still available. There were some left for her in November and pt had not picked them up.

## 2019-11-23 MED ORDER — PANCRELIPASE (LIP-PROT-AMYL) 36000-114000 UNITS PO CPEP: ORAL_CAPSULE | ORAL | 0 refills | Status: DC

## 2019-11-23 NOTE — Telephone Encounter (Signed)
Patient never came to get these samples and they got put back up. Do you want [place] to re-issue. She was unable to come to her November office visit.

## 2019-11-23 NOTE — Telephone Encounter (Signed)
I spoke with Kaitlin [name] and she will have her daughter come tomorrow and pick up the Creon samples.

## 2019-11-23 NOTE — Telephone Encounter (Signed)
Sure She can try the samples as directed before

## 2019-12-03 DIAGNOSIS — E440 Moderate protein-calorie malnutrition: Secondary | ICD-10-CM | POA: Diagnosis not present

## 2019-12-03 DIAGNOSIS — M5417 Radiculopathy, lumbosacral region: Secondary | ICD-10-CM | POA: Diagnosis not present

## 2019-12-03 DIAGNOSIS — G894 Chronic pain syndrome: Secondary | ICD-10-CM | POA: Diagnosis not present

## 2019-12-03 DIAGNOSIS — R1313 Dysphagia, pharyngeal phase: Secondary | ICD-10-CM | POA: Diagnosis not present

## 2019-12-03 DIAGNOSIS — G609 Hereditary and idiopathic neuropathy, unspecified: Secondary | ICD-10-CM | POA: Diagnosis not present

## 2019-12-14 DIAGNOSIS — K5903 Drug induced constipation: Secondary | ICD-10-CM | POA: Diagnosis not present

## 2019-12-14 DIAGNOSIS — G609 Hereditary and idiopathic neuropathy, unspecified: Secondary | ICD-10-CM | POA: Diagnosis not present

## 2019-12-29 ENCOUNTER — Telehealth: Payer: Self-pay | Admitting: Internal Medicine

## 2019-12-29 NOTE — Telephone Encounter (Signed)
She needs to go to the emergency department for evaluation and treatment

## 2019-12-29 NOTE — Telephone Encounter (Signed)
Spoke to the patients son and reported Dr. Celesta Aver instructions to go the the ED for treatment and evaluation. While speaking with the son, he said the patient could hear me and was "shaking her head" indicating she did not want to go to the ED for evaluation. He stated he would attempt to convince her to go. FYI-

## 2019-12-29 NOTE — Telephone Encounter (Signed)
Pt's son reported that pt is experiencing bright red rectal bleeding.  Please call back to discuss.

## 2019-12-29 NOTE — Telephone Encounter (Signed)
Spoke to the patients son who report today around 2:30 pm, his mother started passing only bright red blood from her rectum. He visualized one and stated it was only blood, no stool. 6 episodes since 2:30 pm. Endorses weakness, dizziness, and lightheadedness but has only eaten 1 egg today. Patient on Eliquis, no recent surgeries or procedures. Please advise?  Note to nurse: sons number [phone]

## 2019-12-30 ENCOUNTER — Telehealth: Payer: Self-pay | Admitting: Interventional Cardiology

## 2019-12-30 ENCOUNTER — Other Ambulatory Visit: Payer: Self-pay | Admitting: *Deleted

## 2019-12-30 DIAGNOSIS — K921 Melena: Secondary | ICD-10-CM

## 2019-12-30 NOTE — Telephone Encounter (Signed)
No sign she went to ED   Please check on her and see what is going on  Thanks

## 2019-12-30 NOTE — Telephone Encounter (Signed)
I spoke with the son who reported over the next few hours, the amount and frequency of blood passed from the rectum lessen and has subsided as of this morning. He stated his mother told him yesterday, prior to the start of the episodes, she was "rushing to get to the beauty salon" and was straining to hasten having a BM and shortly thereafter, started passing BRBPR. The son also mentioned his mother has been having more frequent nose bleeds so they were going to schedule an appointment with cardiology to discuss her Eliquis dosing. I asked about her symptoms of weakness, lightheadedness and dizziness and if this has resolved and the son attributed these symptoms to age and "anxiety" and stated his mother could not differentiate between mental/emotional effects of the bleeding or the physiological effects of the bleeding. As of now, his mother is doing well and has no symptoms other than weakness that he says is her baseline.

## 2019-12-30 NOTE — Telephone Encounter (Signed)
Orders for CBC placed in Epic.  Her son advised her to stop her Eliquis yesterday, she did not take last nights dose or her dose today. She will continue under the instruction/recommendations of her cardiologist.

## 2019-12-30 NOTE — Telephone Encounter (Signed)
I recommend that she stop the Eliquis for now and contact cardiology about the bleeding as planned and make a determination about whether to continue it.  She should have a CBC done today/tomorrow. Dx hematochezia  If she does this please look out for the result as I will not be regularly monitoring the chart this PM and tomorrow.

## 2019-12-30 NOTE — Telephone Encounter (Signed)
Dr. Caryl Comes follows Eliquis.  Will route to his nurse.

## 2019-12-30 NOTE — Telephone Encounter (Signed)
Pt c/o medication issue:  1. Name of Medication:   apixaban (ELIQUIS) 2.5 MG TABS tablet    2. How are you currently taking this medication (dosage and times per day)?  1 tablet (5 mg total) by mouth 2 (two) times daily.  3. Are you having a reaction (difficulty breathing--STAT)? No  4. What is your medication issue? Patient states she is concerned that she may need to modify her medication intake. She states on yesterday, 12/29/19 she had blood in her stool and she assumes it is due to her medication intake. Please advise.

## 2019-12-31 DIAGNOSIS — Z1211 Encounter for screening for malignant neoplasm of colon: Secondary | ICD-10-CM | POA: Diagnosis not present

## 2019-12-31 DIAGNOSIS — I4819 Other persistent atrial fibrillation: Secondary | ICD-10-CM | POA: Diagnosis not present

## 2019-12-31 DIAGNOSIS — K625 Hemorrhage of anus and rectum: Secondary | ICD-10-CM | POA: Diagnosis not present

## 2019-12-31 NOTE — Telephone Encounter (Signed)
Spoke with pt who states she had 4-5 bight red and dark red episodes of bloody diarrhea with pain or cramping on 12/29/2019.  Pt states denies CP, new SOB or new weakness.  Pt states she had what appeared to be a normal bowel movement yesterday.  Pt reports she call her GI provider and was advised to contact cardiology.  Pt states she did not take Eliquis last night and has not taken this morning.    RN spoke with Dr [name] as Dr Caryl Comes is out of office today.  Dr [name] advises pt contact her PCP today for labs and further direction about  finding out where blood is coming from.  Pt will need to hold her Eliquis until this can be established.    Pt advised of Dr [name] recommendation's as above and verbalizes understanding.  Pt agrees with current plan and will contact her PCP.

## 2019-12-31 NOTE — Telephone Encounter (Signed)
This patient has not come in for her blood requested 3/18 as of 4:37 pm.

## 2020-01-03 NOTE — Telephone Encounter (Signed)
Agree that she should follow up with her PCP Thanks SK

## 2020-01-26 ENCOUNTER — Other Ambulatory Visit: Payer: Self-pay | Admitting: Internal Medicine

## 2020-01-26 NOTE — Telephone Encounter (Signed)
Pt last saw Dr [name] 08/26/19, last labs 06/25/19 Creat 1.0, age 86, weight 47.5kg, CrCl 28.04, based on CrCl pt is on appropriate dosage of Eliquis 2.5mg  BID.  Will refill rx.

## 2020-03-06 DIAGNOSIS — M545 Low back pain: Secondary | ICD-10-CM | POA: Diagnosis not present

## 2020-03-06 DIAGNOSIS — M9903 Segmental and somatic dysfunction of lumbar region: Secondary | ICD-10-CM | POA: Diagnosis not present

## 2020-03-06 DIAGNOSIS — M9904 Segmental and somatic dysfunction of sacral region: Secondary | ICD-10-CM | POA: Diagnosis not present

## 2020-03-06 DIAGNOSIS — M9905 Segmental and somatic dysfunction of pelvic region: Secondary | ICD-10-CM | POA: Diagnosis not present

## 2020-03-08 DIAGNOSIS — H401131 Primary open-angle glaucoma, bilateral, mild stage: Secondary | ICD-10-CM | POA: Diagnosis not present

## 2020-03-15 DIAGNOSIS — M9904 Segmental and somatic dysfunction of sacral region: Secondary | ICD-10-CM | POA: Diagnosis not present

## 2020-03-15 DIAGNOSIS — M9903 Segmental and somatic dysfunction of lumbar region: Secondary | ICD-10-CM | POA: Diagnosis not present

## 2020-03-15 DIAGNOSIS — M9905 Segmental and somatic dysfunction of pelvic region: Secondary | ICD-10-CM | POA: Diagnosis not present

## 2020-03-15 DIAGNOSIS — M545 Low back pain: Secondary | ICD-10-CM | POA: Diagnosis not present

## 2020-03-22 DIAGNOSIS — M545 Low back pain: Secondary | ICD-10-CM | POA: Diagnosis not present

## 2020-03-22 DIAGNOSIS — M9904 Segmental and somatic dysfunction of sacral region: Secondary | ICD-10-CM | POA: Diagnosis not present

## 2020-03-22 DIAGNOSIS — M9903 Segmental and somatic dysfunction of lumbar region: Secondary | ICD-10-CM | POA: Diagnosis not present

## 2020-03-22 DIAGNOSIS — M9905 Segmental and somatic dysfunction of pelvic region: Secondary | ICD-10-CM | POA: Diagnosis not present

## 2020-03-27 DIAGNOSIS — M8580 Other specified disorders of bone density and structure, unspecified site: Secondary | ICD-10-CM | POA: Diagnosis not present

## 2020-03-27 DIAGNOSIS — M5442 Lumbago with sciatica, left side: Secondary | ICD-10-CM | POA: Diagnosis not present

## 2020-03-27 DIAGNOSIS — I4819 Other persistent atrial fibrillation: Secondary | ICD-10-CM | POA: Diagnosis not present

## 2020-03-27 DIAGNOSIS — Z0000 Encounter for general adult medical examination without abnormal findings: Secondary | ICD-10-CM | POA: Diagnosis not present

## 2020-03-27 DIAGNOSIS — E440 Moderate protein-calorie malnutrition: Secondary | ICD-10-CM | POA: Diagnosis not present

## 2020-03-27 DIAGNOSIS — G894 Chronic pain syndrome: Secondary | ICD-10-CM | POA: Diagnosis not present

## 2020-03-27 DIAGNOSIS — Z1389 Encounter for screening for other disorder: Secondary | ICD-10-CM | POA: Diagnosis not present

## 2020-03-27 DIAGNOSIS — G609 Hereditary and idiopathic neuropathy, unspecified: Secondary | ICD-10-CM | POA: Diagnosis not present

## 2020-03-31 ENCOUNTER — Other Ambulatory Visit: Payer: Self-pay | Admitting: Internal Medicine

## 2020-03-31 DIAGNOSIS — M8580 Other specified disorders of bone density and structure, unspecified site: Secondary | ICD-10-CM

## 2020-04-04 ENCOUNTER — Other Ambulatory Visit: Payer: Self-pay | Admitting: Internal Medicine

## 2020-04-04 DIAGNOSIS — M8580 Other specified disorders of bone density and structure, unspecified site: Secondary | ICD-10-CM

## 2020-05-09 DIAGNOSIS — L308 Other specified dermatitis: Secondary | ICD-10-CM | POA: Diagnosis not present

## 2020-05-09 DIAGNOSIS — B078 Other viral warts: Secondary | ICD-10-CM | POA: Diagnosis not present

## 2020-05-09 DIAGNOSIS — L820 Inflamed seborrheic keratosis: Secondary | ICD-10-CM | POA: Diagnosis not present

## 2020-06-01 ENCOUNTER — Ambulatory Visit: Payer: Medicare Other | Admitting: Internal Medicine

## 2020-06-23 ENCOUNTER — Other Ambulatory Visit: Payer: Self-pay | Admitting: *Deleted

## 2020-06-23 ENCOUNTER — Telehealth: Payer: Self-pay | Admitting: Interventional Cardiology

## 2020-06-23 NOTE — Telephone Encounter (Signed)
New Message   Pt c/o Shortness Of Breath: STAT if SOB developed within the last 24 hours or pt is noticeably SOB on the phone  1. Are you currently SOB (can you hear that pt is SOB on the phone)? The patient sounded a bit short of breath on the phone   2. How long have you been experiencing SOB? For awhile but it has become worse the past week  3. Are you SOB when sitting or when up moving around? Moving around  4. Are you currently experiencing any other symptoms? Very fatigue   Patient is scheduled for 9/15 Kaitlin [name] is aware

## 2020-06-23 NOTE — Telephone Encounter (Signed)
Spoke with pt and she states her SOB has worsened over the last month.  Denies swelling.  Didn't have vitals nearby but states they are always good.  States she is permanently in AF.  Advised to keep appt with [place] next week and to call if any changes.  Pt agreeable.

## 2020-06-27 ENCOUNTER — Other Ambulatory Visit: Payer: Medicare Other
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
## 2020-06-28 ENCOUNTER — Other Ambulatory Visit: Payer: Self-pay

## 2020-06-28 ENCOUNTER — Ambulatory Visit (INDEPENDENT_AMBULATORY_CARE_PROVIDER_SITE_OTHER): Payer: Medicare Other | Admitting: Interventional Cardiology

## 2020-06-28 ENCOUNTER — Encounter: Payer: Self-pay | Admitting: Interventional Cardiology

## 2020-06-28 VITALS — BP 142/74 | HR 81 | Ht 65.0 in | Wt 104.0 lb

## 2020-06-28 DIAGNOSIS — I6389 Other cerebral infarction: Secondary | ICD-10-CM

## 2020-06-28 DIAGNOSIS — Z7901 Long term (current) use of anticoagulants: Secondary | ICD-10-CM

## 2020-06-28 DIAGNOSIS — I495 Sick sinus syndrome: Secondary | ICD-10-CM

## 2020-06-28 DIAGNOSIS — R0602 Shortness of breath: Secondary | ICD-10-CM | POA: Diagnosis not present

## 2020-06-28 DIAGNOSIS — Z7189 Other specified counseling: Secondary | ICD-10-CM

## 2020-06-28 DIAGNOSIS — I482 Chronic atrial fibrillation, unspecified: Secondary | ICD-10-CM | POA: Diagnosis not present

## 2020-06-28 NOTE — Patient Instructions (Signed)
Medication Instructions:  Your physician recommends that you continue on your current medications as directed. Please refer to the Current Medication list given to you today.  *If you need a refill on your cardiac medications before your next appointment, please call your pharmacy*   Lab Work: Pro BNP today  If you have labs (blood work) drawn today and your tests are completely normal, you will receive your results only by: [name] MyChart Message (if you have MyChart) OR . A paper copy in the mail If you have any lab test that is abnormal or we need to change your treatment, we will call you to review the results.   Testing/Procedures: None   Follow-Up: At [facility], you and your health needs are our priority.  As part of our continuing mission to provide you with exceptional heart care, we have created designated Provider Care Teams.  These Care Teams include your primary Cardiologist (physician) and Advanced Practice Providers (APPs -  Physician Assistants and Nurse Practitioners) who all work together to provide you with the care you need, when you need it.  We recommend signing up for the patient portal called "MyChart".  Sign up information is provided on this After Visit Summary.  MyChart is used to connect with patients for Virtual Visits (Telemedicine).  Patients are able to view lab/test results, encounter notes, upcoming appointments, etc.  Non-urgent messages can be sent to your provider as well.   To learn more about what you can do with MyChart, go to [email or web address].    Your next appointment:   12 month(s)  The format for your next appointment:   In Person  Provider:   You may see [name], MD or one of the following Advanced Practice Providers on your designated Care Team:    Truitt Merle, NP  Cecilie Kicks, NP  Kathyrn Drown, NP    Other Instructions

## 2020-06-29 ENCOUNTER — Telehealth: Payer: Self-pay | Admitting: *Deleted

## 2020-06-29 LAB — PRO B NATRIURETIC PEPTIDE: NT-Pro BNP: 2078 pg/mL — ABNORMAL HIGH (ref 0–738)

## 2020-06-29 LAB — CBC
Hematocrit: 43.2 % (ref 34.0–46.6)
Hemoglobin: 14.6 g/dL (ref 11.1–15.9)
MCH: 30.5 pg (ref 26.6–33.0)
MCHC: 33.8 g/dL (ref 31.5–35.7)
MCV: 90 fL (ref 79–97)
Platelets: 192 10*3/uL (ref 150–450)
RBC: 4.78 x10E6/uL (ref 3.77–5.28)
RDW: 12.5 % (ref 11.7–15.4)
WBC: 6.2 10*3/uL (ref 3.4–10.8)

## 2020-06-29 MED ORDER — SPIRONOLACTONE 25 MG PO TABS: 12.5000 mg | ORAL_TABLET | ORAL | 3 refills | Status: DC

## 2020-06-29 NOTE — Telephone Encounter (Signed)
Spoke with pt and went over results and recommendations.  Pt verbalized understanding and was in agreement with plan.

## 2020-06-29 NOTE — Telephone Encounter (Signed)
-----   Message from Belva Crome, MD sent at 06/29/2020  3:52 PM EDT ----- Let the patient know the BNP is high suggesting fluid contributes to shortness of breath. Recommend Spironolactone 12.5 mg M, W, and F. A copy will be sent to [name], MD

## 2020-07-11 ENCOUNTER — Telehealth: Payer: Self-pay | Admitting: Interventional Cardiology

## 2020-07-11 NOTE — Telephone Encounter (Signed)
Pt was calling to talk with Dr. [name] nurse regarding medication reaction to spironolactone (ALDACTONE) 25 MG tablet after visit with Dr. [name]. Pt had diarrhea, sharpe stomach pains, SOB, and had a bad headache

## 2020-07-11 NOTE — Telephone Encounter (Signed)
I spoke with patient. She started spironolactone last Monday (9/20).  On Friday she had diarrhea, stomach pain and a headache. She stopped spironolactone and symptoms improved.  She reports she still has shortness of breath with exertion but it is better than it was when she saw Dr [name].  She has back pain and neuropathy and had cut back on her pain medication.  She has since resumed her previous dose and she thinks this has helped shortness of breath.  She is asking if there is another medication she could take in place of spironolactone.

## 2020-07-13 NOTE — Telephone Encounter (Signed)
Furosemide, 20 mg Monday, Wednesday, and Friday.  K. Dur 10 mEq on Monday, Wednesday, and Friday.

## 2020-07-14 NOTE — Telephone Encounter (Signed)
Spoke with pt and went over recommendations.  Pt states that she was speaking with her son a little bit ago, who is a Software engineer, and he told her that he didn't think her symptoms came from the Spironolactone since it was such a low dose.  Pt states she would like to try the Spironolactone again before changing medications.  If she has the same reaction, she will stop the medicine and call the office so we can send in the Furosemide and KDUR.

## 2020-07-20 ENCOUNTER — Other Ambulatory Visit (HOSPITAL_COMMUNITY): Payer: Self-pay

## 2020-07-20 ENCOUNTER — Encounter: Payer: Self-pay | Admitting: Internal Medicine

## 2020-07-20 ENCOUNTER — Ambulatory Visit (INDEPENDENT_AMBULATORY_CARE_PROVIDER_SITE_OTHER): Payer: Medicare Other | Admitting: Internal Medicine

## 2020-07-20 VITALS — BP 102/66 | HR 88 | Ht 65.0 in | Wt 100.0 lb

## 2020-07-20 DIAGNOSIS — G8929 Other chronic pain: Secondary | ICD-10-CM | POA: Diagnosis not present

## 2020-07-20 DIAGNOSIS — I6389 Other cerebral infarction: Secondary | ICD-10-CM

## 2020-07-20 DIAGNOSIS — K921 Melena: Secondary | ICD-10-CM

## 2020-07-20 DIAGNOSIS — R1310 Dysphagia, unspecified: Secondary | ICD-10-CM

## 2020-07-20 DIAGNOSIS — R059 Cough, unspecified: Secondary | ICD-10-CM

## 2020-07-20 DIAGNOSIS — R1031 Right lower quadrant pain: Secondary | ICD-10-CM | POA: Diagnosis not present

## 2020-07-20 DIAGNOSIS — R1314 Dysphagia, pharyngoesophageal phase: Secondary | ICD-10-CM | POA: Diagnosis not present

## 2020-07-20 DIAGNOSIS — K224 Dyskinesia of esophagus: Secondary | ICD-10-CM
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
## 2020-07-20 NOTE — Patient Instructions (Signed)
You have been scheduled for a modified barium swallow on 09/19/2020 at 1:00pm. Please arrive 15 minutes prior to your test for registration. You will go to [facility] (1st Floor) for your appointment. Should you need to cancel or reschedule your appointment, please contact [phone] [place]) or [phone] [place]).  You may eat and drink the day of your test, no restrictions. Use the Valet parking and they will help you get to where you need to be. _____________________________________________________________________ A Modified Barium Swallow Study, or MBS, is a special x-ray that is taken to check swallowing skills. It is carried out by a Stage manager and a Psychologist, clinical (SLP). During this test, yourmouth, throat, and esophagus, a muscular tube which connects your mouth to your stomach, is checked. The test will help you, your doctor, and the SLP plan what types of foods and liquids are easier for you to swallow. The SLP will also identify positions and ways to help you swallow more easily and safely. What will happen during an MBS? You will be taken to an x-ray room and seated comfortably. You will be asked to swallow small amounts of food and liquid mixed with barium. Barium is a liquid or paste that allows images of your mouth, throat and esophagus to be seen on x-ray. The x-ray captures moving images of the food you are swallowing as it travels from your mouth through your throat and into your esophagus. This test helps identify whether food or liquid is entering your lungs (aspiration). The test also shows which part of your mouth or throat lacks strength or coordination to move the food or liquid in the right direction. This test typically takes 30 minutes to 1 hour to complete. _______________________________________________________________________   I appreciate the opportunity to care for you. Silvano Rusk, MD, [facility]

## 2020-08-02 ENCOUNTER — Other Ambulatory Visit: Payer: Self-pay | Admitting: Interventional Cardiology

## 2020-08-02 NOTE — Telephone Encounter (Signed)
Eliquis 2.5mg  refill request received. Patient is 86 years old, weight-45.4kg, Crea-0.70 on 12/31/2019 via [facility] from [place], [place], and last seen by Dr. [name] on 06/28/2020. Dose is appropriate based on dosing criteria. Will send in refill to requested pharmacy.

## 2020-08-14 ENCOUNTER — Telehealth: Payer: Self-pay | Admitting: Interventional Cardiology

## 2020-08-14 DIAGNOSIS — R0602 Shortness of breath: Secondary | ICD-10-CM

## 2020-08-14 NOTE — Telephone Encounter (Signed)
Pt states since starting Spironolactone she has felt weak and has no energy.  Denies swelling and states she has urinated quite a bit more since starting the Spironolactone.  Vitals usually 130s-140s/70s-80s, HR 70-80.  States it also feels like her heart is pounding at times.  Last just a short bit.  Currently takes Spironolactone 12.5mg  once daily on MWF.  Pt would like to have BNP drawn again if Dr. [name] is in agreement.

## 2020-08-14 NOTE — Telephone Encounter (Signed)
    Pt would like to speak with Anderson Malta

## 2020-08-15 NOTE — Telephone Encounter (Signed)
Has the medication helped her breathing or decreased any swelling? Okay to repeat BNP,

## 2020-08-15 NOTE — Telephone Encounter (Signed)
See below

## 2020-08-16 NOTE — Telephone Encounter (Signed)
Asked Dr. [name] if it would be ok to get a BMET as well and he agreed.  Spoke with pt and she denies swelling.  States breathing is better but worsens at times.  Scheduled labs for tomorrow.  Pt appreciative for call.

## 2020-08-17 ENCOUNTER — Other Ambulatory Visit: Payer: Self-pay

## 2020-08-17 ENCOUNTER — Other Ambulatory Visit: Payer: Medicare Other

## 2020-08-17 DIAGNOSIS — R0602 Shortness of breath: Secondary | ICD-10-CM | POA: Diagnosis not present

## 2020-08-18 LAB — BASIC METABOLIC PANEL
BUN/Creatinine Ratio: 30 — ABNORMAL HIGH (ref 12–28)
BUN: 22 mg/dL (ref 10–36)
CO2: 24 mmol/L (ref 20–29)
Calcium: 9.7 mg/dL (ref 8.7–10.3)
Chloride: 98 mmol/L (ref 96–106)
Creatinine, Ser: 0.74 mg/dL (ref 0.57–1.00)
GFR calc Af Amer: 82 mL/min/{1.73_m2} (ref >59)
GFR calc non Af Amer: 72 mL/min/{1.73_m2} (ref >59)
Glucose: 103 mg/dL — ABNORMAL HIGH (ref 65–99)
Potassium: 4.7 mmol/L (ref 3.5–5.2)
Sodium: 138 mmol/L (ref 134–144)

## 2020-08-18 LAB — PRO B NATRIURETIC PEPTIDE: NT-Pro BNP: 1472 pg/mL — ABNORMAL HIGH (ref 0–738)

## 2020-08-28 DIAGNOSIS — H401131 Primary open-angle glaucoma, bilateral, mild stage: Secondary | ICD-10-CM | POA: Diagnosis not present

## 2020-08-28 DIAGNOSIS — H0102B Squamous blepharitis left eye, upper and lower eyelids: Secondary | ICD-10-CM | POA: Diagnosis not present

## 2020-08-28 DIAGNOSIS — H04123 Dry eye syndrome of bilateral lacrimal glands: Secondary | ICD-10-CM | POA: Diagnosis not present

## 2020-09-15 ENCOUNTER — Telehealth: Payer: Self-pay | Admitting: Interventional Cardiology

## 2020-09-15 NOTE — Telephone Encounter (Signed)
Spoke with pt and she states since starting Spironolactone she has felt anxious and jittery.  Has lost weight down to 95lbs.  States she has never gotten that low.  At times feels like her heart is beating out of her chest.  HR and BP are always fine though.  Occasionally BP may be a little elevated.  Pt would like to come off of this medication if Dr. [name] is agreeable.  Advised I will send to him to review.  Currently takes Spironolactone 12.5mg  MWF.

## 2020-09-15 NOTE — Telephone Encounter (Signed)
Pt c/o medication issue:  1. Name of Medication:  spironolactone (ALDACTONE) 25 MG tablet  2. How are you currently taking this medication (dosage and times per day)?  As directed   3. Are you having a reaction (difficulty breathing--STAT)?  No   4. What is your medication issue?  She wants to know when she can come off of this medication. She knows she can't stop all together but she wants to know if it is safe to stop or when she can stop.    Please call/advise. Thank you !

## 2020-09-19 ENCOUNTER — Ambulatory Visit (HOSPITAL_COMMUNITY)
Admission: RE | Admit: 2020-09-19 | Discharge: 2020-09-19 | Disposition: A | Discharge status: Home / Self Care | Payer: Medicare Other | Source: Ambulatory Visit | Attending: Internal Medicine | Admitting: Internal Medicine

## 2020-09-19 ENCOUNTER — Other Ambulatory Visit: Payer: Self-pay

## 2020-09-19 DIAGNOSIS — K224 Dyskinesia of esophagus: Secondary | ICD-10-CM

## 2020-09-19 DIAGNOSIS — R059 Cough, unspecified: Secondary | ICD-10-CM | POA: Insufficient documentation

## 2020-09-19 DIAGNOSIS — R1310 Dysphagia, unspecified: Secondary | ICD-10-CM | POA: Diagnosis not present

## 2020-09-19 DIAGNOSIS — R1314 Dysphagia, pharyngoesophageal phase: Secondary | ICD-10-CM | POA: Insufficient documentation

## 2020-09-21 ENCOUNTER — Ambulatory Visit: Payer: Medicare Other | Admitting: Internal Medicine

## 2020-09-22 NOTE — Telephone Encounter (Signed)
Spoke with pt and made her aware of recommendations.  Pt verbalized understanding.  

## 2020-09-22 NOTE — Telephone Encounter (Signed)
Tell her that it is okay to discontinue spironolactone

## 2020-09-26 ENCOUNTER — Other Ambulatory Visit: Payer: Self-pay | Admitting: Internal Medicine

## 2020-09-26 ENCOUNTER — Ambulatory Visit
Admission: RE | Admit: 2020-09-26 | Discharge: 2020-09-26 | Disposition: A | Discharge status: Home / Self Care | Payer: Medicare Other | Source: Ambulatory Visit | Attending: Internal Medicine | Admitting: Internal Medicine

## 2020-09-26 DIAGNOSIS — R0600 Dyspnea, unspecified: Secondary | ICD-10-CM

## 2020-09-26 DIAGNOSIS — G609 Hereditary and idiopathic neuropathy, unspecified: Secondary | ICD-10-CM | POA: Diagnosis not present

## 2020-09-26 DIAGNOSIS — R300 Dysuria: Secondary | ICD-10-CM | POA: Diagnosis not present

## 2020-09-26 DIAGNOSIS — I4819 Other persistent atrial fibrillation: Secondary | ICD-10-CM | POA: Diagnosis not present

## 2020-09-26 DIAGNOSIS — F419 Anxiety disorder, unspecified: Secondary | ICD-10-CM | POA: Diagnosis not present

## 2020-09-26 DIAGNOSIS — R0602 Shortness of breath: Secondary | ICD-10-CM | POA: Diagnosis not present

## 2020-12-04 DIAGNOSIS — H353132 Nonexudative age-related macular degeneration, bilateral, intermediate dry stage: Secondary | ICD-10-CM | POA: Diagnosis not present

## 2020-12-04 DIAGNOSIS — H532 Diplopia: Secondary | ICD-10-CM | POA: Diagnosis not present

## 2020-12-04 DIAGNOSIS — H04123 Dry eye syndrome of bilateral lacrimal glands: Secondary | ICD-10-CM | POA: Diagnosis not present

## 2020-12-04 DIAGNOSIS — H401131 Primary open-angle glaucoma, bilateral, mild stage: Secondary | ICD-10-CM | POA: Diagnosis not present

## 2021-02-01 ENCOUNTER — Other Ambulatory Visit: Payer: Self-pay | Admitting: Interventional Cardiology

## 2021-02-01 NOTE — Telephone Encounter (Signed)
Prescription refill request for Eliquis received. Indication: PAF Last office visit: 06/28/20 Scr:  0.74 on 08/17/20 Age: 88  Weight: 47.2kg  Based on above findings Eliquis 2.5mg  twice daily is the appropriate dose.  Refills approved.

## 2021-02-20 DIAGNOSIS — R1313 Dysphagia, pharyngeal phase: Secondary | ICD-10-CM | POA: Diagnosis not present

## 2021-02-20 DIAGNOSIS — R0600 Dyspnea, unspecified: Secondary | ICD-10-CM | POA: Diagnosis not present

## 2021-02-20 DIAGNOSIS — M5441 Lumbago with sciatica, right side: Secondary | ICD-10-CM | POA: Diagnosis not present

## 2021-02-20 DIAGNOSIS — R5383 Other fatigue: Secondary | ICD-10-CM | POA: Diagnosis not present

## 2021-02-20 DIAGNOSIS — R946 Abnormal results of thyroid function studies: Secondary | ICD-10-CM | POA: Diagnosis not present

## 2021-02-20 DIAGNOSIS — M5136 Other intervertebral disc degeneration, lumbar region: Secondary | ICD-10-CM | POA: Diagnosis not present

## 2021-02-20 DIAGNOSIS — M47816 Spondylosis without myelopathy or radiculopathy, lumbar region: Secondary | ICD-10-CM | POA: Diagnosis not present

## 2021-03-29 DIAGNOSIS — M5417 Radiculopathy, lumbosacral region: Secondary | ICD-10-CM | POA: Diagnosis not present

## 2021-03-29 DIAGNOSIS — M4727 Other spondylosis with radiculopathy, lumbosacral region: Secondary | ICD-10-CM | POA: Diagnosis not present

## 2021-03-29 DIAGNOSIS — M5136 Other intervertebral disc degeneration, lumbar region: Secondary | ICD-10-CM | POA: Diagnosis not present

## 2021-03-29 DIAGNOSIS — M47816 Spondylosis without myelopathy or radiculopathy, lumbar region: Secondary | ICD-10-CM | POA: Diagnosis not present

## 2021-03-29 DIAGNOSIS — Z1389 Encounter for screening for other disorder: Secondary | ICD-10-CM | POA: Diagnosis not present

## 2021-03-29 DIAGNOSIS — E440 Moderate protein-calorie malnutrition: Secondary | ICD-10-CM | POA: Diagnosis not present

## 2021-03-29 DIAGNOSIS — Z0000 Encounter for general adult medical examination without abnormal findings: Secondary | ICD-10-CM | POA: Diagnosis not present

## 2021-03-29 DIAGNOSIS — I4819 Other persistent atrial fibrillation: Secondary | ICD-10-CM | POA: Diagnosis not present

## 2021-03-29 DIAGNOSIS — F329 Major depressive disorder, single episode, unspecified: Secondary | ICD-10-CM | POA: Diagnosis not present

## 2021-03-29 DIAGNOSIS — G609 Hereditary and idiopathic neuropathy, unspecified: Secondary | ICD-10-CM | POA: Diagnosis not present

## 2021-05-04 ENCOUNTER — Other Ambulatory Visit: Payer: Self-pay | Admitting: Interventional Cardiology

## 2021-05-04 NOTE — Telephone Encounter (Signed)
Pt last saw Dr [name] 06/28/20, last labs 08/17/20 Creat 0.74, age 87, weight 45.4kg, based on specified criteria pt is on appropriate dosage of Eliquis 2.'5mg'$  BID.  Will refill rx.

## 2021-05-08 DIAGNOSIS — G894 Chronic pain syndrome: Secondary | ICD-10-CM | POA: Diagnosis not present

## 2021-05-08 DIAGNOSIS — G629 Polyneuropathy, unspecified: Secondary | ICD-10-CM | POA: Diagnosis not present

## 2021-06-06 DIAGNOSIS — H04123 Dry eye syndrome of bilateral lacrimal glands: Secondary | ICD-10-CM | POA: Diagnosis not present

## 2021-06-06 DIAGNOSIS — H40003 Preglaucoma, unspecified, bilateral: Secondary | ICD-10-CM | POA: Diagnosis not present

## 2021-06-22 ENCOUNTER — Telehealth: Payer: Self-pay | Admitting: Internal Medicine

## 2021-06-22 MED ORDER — PANCRELIPASE (LIP-PROT-AMYL) 36000-114000 UNITS PO CPEP: ORAL_CAPSULE | ORAL | 5 refills | Status: DC

## 2021-06-22 NOTE — Telephone Encounter (Signed)
Rx sent The patient's daughter was notified

## 2021-06-22 NOTE — Telephone Encounter (Signed)
Cren 36K  2 w/ meals 1 w/ snacks 1 month supple = 240 I think 5 RF

## 2021-06-22 NOTE — Telephone Encounter (Signed)
Pt's son Kaitlin [name] called requesting prescription for Creon. He stated tha pt has been loosing weight and now she is down to 91 Lbs. Pt stated that she had tried that before and helped. Her pharmacy is [facility] in [place]. Any questions, pls call pt directly.

## 2021-06-22 NOTE — Telephone Encounter (Signed)
Do you want to prescribe again?

## 2021-06-26 ENCOUNTER — Ambulatory Visit: Payer: Medicare Other | Admitting: Physician Assistant

## 2021-06-26 ENCOUNTER — Telehealth: Payer: Self-pay | Admitting: *Deleted

## 2021-06-26 NOTE — Telephone Encounter (Signed)
S/w pt's daughter per [facility]) ot move pts appt. Per daughter pt is not doing well.  Losing weight and feels like doctor's are pushing her care to the side due to age. R/S appt with SW on Sept 21.
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
## 2021-07-04 ENCOUNTER — Other Ambulatory Visit: Payer: Self-pay

## 2021-07-04 ENCOUNTER — Ambulatory Visit (INDEPENDENT_AMBULATORY_CARE_PROVIDER_SITE_OTHER): Payer: Medicare Other | Admitting: Physician Assistant

## 2021-07-04 ENCOUNTER — Encounter: Payer: Self-pay | Admitting: Physician Assistant

## 2021-07-04 VITALS — BP 142/76 | HR 95 | Ht 64.0 in | Wt 96.2 lb

## 2021-07-04 DIAGNOSIS — I4821 Permanent atrial fibrillation: Secondary | ICD-10-CM

## 2021-07-04 DIAGNOSIS — R0602 Shortness of breath: Secondary | ICD-10-CM | POA: Diagnosis not present

## 2021-07-04 DIAGNOSIS — R251 Tremor, unspecified: Secondary | ICD-10-CM

## 2021-07-04 DIAGNOSIS — I5032 Chronic diastolic (congestive) heart failure: Secondary | ICD-10-CM | POA: Diagnosis not present

## 2021-07-04 DIAGNOSIS — Z7901 Long term (current) use of anticoagulants: Secondary | ICD-10-CM

## 2021-07-04 NOTE — Patient Instructions (Signed)
Medication Instructions:  Your physician recommends that you continue on your current medications as directed. Please refer to the Current Medication list given to you today.  *If you need a refill on your cardiac medications before your next appointment, please call your pharmacy*   Lab Work: BMET, BNP, and CBC today If you have labs (blood work) drawn today and your tests are completely normal, you will receive your results only by: [place] (if you have MyChart) OR A paper copy in the mail If you have any lab test that is abnormal or we need to change your treatment, we will call you to review the results.   Testing/Procedures: Your physician has requested that you have an echocardiogram. Echocardiography is a painless test that uses sound waves to create images of your heart. It provides your doctor with information about the size and shape of your heart and how well your heart's chambers and valves are working. This procedure takes approximately one hour. There are no restrictions for this procedure.    Follow-Up: At [facility], you and your health needs are our priority.  As part of our continuing mission to provide you with exceptional heart care, we have created designated Provider Care Teams.  These Care Teams include your primary Cardiologist (physician) and Advanced Practice Providers (APPs -  Physician Assistants and Nurse Practitioners) who all work together to provide you with the care you need, when you need it.   Your next appointment:   2-3 month(s)  The format for your next appointment:   In Person  Provider:   Daneen Schick, MD

## 2021-07-05 LAB — CBC
Hematocrit: 44.6 % (ref 34.0–46.6)
Hemoglobin: 15.4 g/dL (ref 11.1–15.9)
MCH: 30.9 pg (ref 26.6–33.0)
MCHC: 34.5 g/dL (ref 31.5–35.7)
MCV: 89 fL (ref 79–97)
Platelets: 206 10*3/uL (ref 150–450)
RBC: 4.99 x10E6/uL (ref 3.77–5.28)
RDW: 12.8 % (ref 11.7–15.4)
WBC: 5.8 10*3/uL (ref 3.4–10.8)

## 2021-07-05 LAB — PRO B NATRIURETIC PEPTIDE: NT-Pro BNP: 1488 pg/mL — ABNORMAL HIGH (ref 0–738)

## 2021-07-05 LAB — BASIC METABOLIC PANEL
BUN/Creatinine Ratio: 28 (ref 12–28)
BUN: 17 mg/dL (ref 10–36)
CO2: 25 mmol/L (ref 20–29)
Calcium: 9.5 mg/dL (ref 8.7–10.3)
Chloride: 100 mmol/L (ref 96–106)
Creatinine, Ser: 0.61 mg/dL (ref 0.57–1.00)
Glucose: 88 mg/dL (ref 65–99)
Potassium: 5 mmol/L (ref 3.5–5.2)
Sodium: 141 mmol/L (ref 134–144)
eGFR: 84 mL/min/{1.73_m2} (ref >59)

## 2021-07-06 NOTE — Addendum Note (Signed)
Addended by: Briant Cedar on: 07/06/2021 08:32 AM   Modules accepted: Orders

## 2021-07-18 ENCOUNTER — Ambulatory Visit (HOSPITAL_COMMUNITY): Payer: Medicare Other | Attending: Cardiovascular Disease

## 2021-07-18 ENCOUNTER — Other Ambulatory Visit: Payer: Self-pay

## 2021-07-18 DIAGNOSIS — I5032 Chronic diastolic (congestive) heart failure: Secondary | ICD-10-CM | POA: Diagnosis not present

## 2021-07-18 DIAGNOSIS — R0602 Shortness of breath: Secondary | ICD-10-CM | POA: Diagnosis not present

## 2021-07-18 DIAGNOSIS — I4821 Permanent atrial fibrillation: Secondary | ICD-10-CM | POA: Diagnosis not present

## 2021-07-18 LAB — ECHOCARDIOGRAM COMPLETE
Area-P 1/2: 4.37 cm2
S' Lateral: 1.8 cm

## 2021-07-20 ENCOUNTER — Telehealth: Payer: Self-pay

## 2021-07-20 NOTE — Telephone Encounter (Signed)
-----   Message from Liliane Shi, [place] sent at 07/20/2021  1:59 PM EDT ----- Reviewed echocardiogram with Dr. [name]. Her echocardiogram findings are most consistent with diastolic dysfunction or diastolic heart failure.  This can cause shortness of breath.   One medication that may improve breathing/symptoms is [place] or [place]. If she is willing to try this, start Farxiga 5 mg once daily and get a BMET 2 weeks later.  If she would prefer to not start anything new, keep f/u with Dr. [name] as planned.  Richardson Dopp, PA-C    07/20/2021 1:58 PM

## 2021-07-20 NOTE — Telephone Encounter (Signed)
Patient and her daughter  (DPR) is aware of results. Patient would like to think about taking Wilder Glade and discuss it with her daughter and son. Patient stated she would call next week to let us know.

## 2021-07-23 NOTE — Telephone Encounter (Signed)
Patient called back to let us know she does not want to start any new medication right now, but if she changes her mind she will give our office a call.

## 2021-07-23 NOTE — Telephone Encounter (Signed)
[place], [place]    07/23/2021 3:49 PM

## 2021-09-20 DIAGNOSIS — G609 Hereditary and idiopathic neuropathy, unspecified: Secondary | ICD-10-CM | POA: Diagnosis not present

## 2021-09-20 DIAGNOSIS — M79672 Pain in left foot: Secondary | ICD-10-CM | POA: Diagnosis not present

## 2021-09-20 DIAGNOSIS — R300 Dysuria: Secondary | ICD-10-CM | POA: Diagnosis not present

## 2021-09-20 DIAGNOSIS — K224 Dyskinesia of esophagus: Secondary | ICD-10-CM | POA: Diagnosis not present

## 2021-09-20 DIAGNOSIS — M5136 Other intervertebral disc degeneration, lumbar region: Secondary | ICD-10-CM | POA: Diagnosis not present

## 2021-09-20 DIAGNOSIS — R269 Unspecified abnormalities of gait and mobility: Secondary | ICD-10-CM | POA: Diagnosis not present

## 2021-09-20 DIAGNOSIS — R918 Other nonspecific abnormal finding of lung field: Secondary | ICD-10-CM | POA: Diagnosis not present

## 2021-09-20 DIAGNOSIS — R1313 Dysphagia, pharyngeal phase: Secondary | ICD-10-CM | POA: Diagnosis not present

## 2021-09-20 DIAGNOSIS — M79671 Pain in right foot: Secondary | ICD-10-CM | POA: Diagnosis not present

## 2021-10-10 ENCOUNTER — Telehealth: Payer: Self-pay | Admitting: Internal Medicine

## 2021-10-10 NOTE — Telephone Encounter (Signed)
Pt daughter Peter Congo states that Her mom is having difficulty swallowing. Peter Congo states that pt used to have her esophagus stretched but was told that she was to old to continue with dilations. Pt was set up for an OV with Alonza Bogus PA 10/18/2021 at 1:30 Peter Congo made aware Peter Congo Verbalized understanding with all questions answered.

## 2021-10-10 NOTE — Telephone Encounter (Signed)
Patient called states she is having issues swallowing seeking advise.

## 2021-10-16 ENCOUNTER — Ambulatory Visit (INDEPENDENT_AMBULATORY_CARE_PROVIDER_SITE_OTHER): Payer: Medicare Other | Admitting: Podiatry

## 2021-10-16 ENCOUNTER — Ambulatory Visit: Payer: Medicare Other

## 2021-10-16 ENCOUNTER — Other Ambulatory Visit: Payer: Self-pay

## 2021-10-16 DIAGNOSIS — M79674 Pain in right toe(s): Secondary | ICD-10-CM | POA: Diagnosis not present

## 2021-10-16 DIAGNOSIS — Z7901 Long term (current) use of anticoagulants: Secondary | ICD-10-CM | POA: Diagnosis not present

## 2021-10-16 DIAGNOSIS — R2681 Unsteadiness on feet: Secondary | ICD-10-CM

## 2021-10-16 DIAGNOSIS — B351 Tinea unguium: Secondary | ICD-10-CM | POA: Diagnosis not present

## 2021-10-16 DIAGNOSIS — M79675 Pain in left toe(s): Secondary | ICD-10-CM

## 2021-10-16 DIAGNOSIS — L84 Corns and callosities: Secondary | ICD-10-CM | POA: Diagnosis not present

## 2021-10-16 DIAGNOSIS — M79671 Pain in right foot: Secondary | ICD-10-CM

## 2021-10-18 ENCOUNTER — Encounter: Payer: Self-pay | Admitting: Gastroenterology

## 2021-10-18 ENCOUNTER — Ambulatory Visit (INDEPENDENT_AMBULATORY_CARE_PROVIDER_SITE_OTHER): Payer: Medicare Other | Admitting: Gastroenterology

## 2021-10-18 VITALS — BP 140/70 | HR 88 | Ht 63.0 in | Wt 98.2 lb

## 2021-10-18 DIAGNOSIS — R1310 Dysphagia, unspecified: Secondary | ICD-10-CM | POA: Diagnosis not present
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
## 2021-10-18 NOTE — Patient Instructions (Signed)
You have been scheduled for a Barium Esophogram at [facility] (1st floor of the hospital) on Monday 10/22/21 at 10 am. Please arrive 15 minutes prior to your appointment for registration. Make certain not to have anything to eat or drink 3 hours prior to your test. If you need to reschedule for any reason, please contact radiology at [phone] to do so. __________________________________________________________________ A barium swallow is an examination that concentrates on views of the esophagus. This tends to be a double contrast exam (barium and two liquids which, when combined, create a gas to distend the wall of the oesophagus) or single contrast (non-ionic iodine based). The study is usually tailored to your symptoms so a good history is essential. Attention is paid during the study to the form, structure and configuration of the esophagus, looking for functional disorders (such as aspiration, dysphagia, achalasia, motility and reflux) EXAMINATION You may be asked to change into a gown, depending on the type of swallow being performed. A radiologist and radiographer will perform the procedure. The radiologist will advise you of the type of contrast selected for your procedure and direct you during the exam. You will be asked to stand, sit or lie in several different positions and to hold a small amount of fluid in your mouth before being asked to swallow while the imaging is performed .In some instances you may be asked to swallow barium coated marshmallows to assess the motility of a solid food bolus. The exam can be recorded as a digital or video fluoroscopy procedure. POST PROCEDURE It will take 1-2 days for the barium to pass through your system. To facilitate this, it is important, unless otherwise directed, to increase your fluids for the next 24-48hrs and to resume your normal diet.  This test typically takes about 30 minutes to  perform. __________________________________________________________________________
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
## 2021-10-22 ENCOUNTER — Ambulatory Visit (HOSPITAL_COMMUNITY)
Admission: RE | Admit: 2021-10-22 | Discharge: 2021-10-22 | Disposition: A | Discharge status: Home / Self Care | Payer: Medicare Other | Source: Ambulatory Visit | Attending: Gastroenterology | Admitting: Gastroenterology

## 2021-10-22 ENCOUNTER — Other Ambulatory Visit: Payer: Self-pay

## 2021-10-22 ENCOUNTER — Telehealth: Payer: Self-pay | Admitting: Gastroenterology

## 2021-10-22 DIAGNOSIS — R1310 Dysphagia, unspecified: Secondary | ICD-10-CM | POA: Insufficient documentation

## 2021-10-22 DIAGNOSIS — K224 Dyskinesia of esophagus: Secondary | ICD-10-CM | POA: Diagnosis not present

## 2021-10-22 NOTE — Telephone Encounter (Signed)
Patients daughter calling for her mother who had a barium esophagram today and was expecting a cal back. Worsening dysphagia even with water. Review esophagram result and they want to know the next step to help her which will likely be EGD with dilation. Advised to remain on liquids only for now. They would like a call back in the morning with the plan.

## 2021-10-22 NOTE — Telephone Encounter (Signed)
Patients daughter called and stated that patient had a barium swallow today, patient is not able to eat or drink. Daughter seeking advice as to what they need to do. Please advise.  [phone]

## 2021-10-23 ENCOUNTER — Ambulatory Visit (INDEPENDENT_AMBULATORY_CARE_PROVIDER_SITE_OTHER): Payer: Medicare Other | Admitting: Interventional Cardiology

## 2021-10-23 ENCOUNTER — Encounter: Payer: Self-pay | Admitting: Interventional Cardiology

## 2021-10-23 ENCOUNTER — Telehealth: Payer: Self-pay

## 2021-10-23 ENCOUNTER — Other Ambulatory Visit: Payer: Self-pay

## 2021-10-23 VITALS — BP 122/90 | HR 74 | Ht 63.0 in | Wt 95.8 lb

## 2021-10-23 DIAGNOSIS — I6389 Other cerebral infarction: Secondary | ICD-10-CM | POA: Diagnosis not present

## 2021-10-23 DIAGNOSIS — I5032 Chronic diastolic (congestive) heart failure: Secondary | ICD-10-CM

## 2021-10-23 DIAGNOSIS — Z7901 Long term (current) use of anticoagulants: Secondary | ICD-10-CM | POA: Diagnosis not present

## 2021-10-23 DIAGNOSIS — I4821 Permanent atrial fibrillation: Secondary | ICD-10-CM

## 2021-10-23 DIAGNOSIS — I495 Sick sinus syndrome: Secondary | ICD-10-CM

## 2021-10-23 DIAGNOSIS — K222 Esophageal obstruction: Secondary | ICD-10-CM

## 2021-10-23 NOTE — Telephone Encounter (Signed)
I called her and reviewed risks, benefits and indications. She is still thinking about it - let us proceed as if she will do it and adjust as necessary

## 2021-10-23 NOTE — Patient Instructions (Signed)
Medication Instructions:  Your physician recommends that you continue on your current medications as directed. Please refer to the Current Medication list given to you today.  *If you need a refill on your cardiac medications before your next appointment, please call your pharmacy*   Lab Work: None today If you have labs (blood work) drawn today and your tests are completely normal, you will receive your results only by: [place] (if you have MyChart) OR A paper copy in the mail If you have any lab test that is abnormal or we need to change your treatment, we will call you to review the results.   Testing/Procedures: None today   Follow-Up: At [facility], you and your health needs are our priority.  As part of our continuing mission to provide you with exceptional heart care, we have created designated Provider Care Teams.  These Care Teams include your primary Cardiologist (physician) and Advanced Practice Providers (APPs -  Physician Assistants and Nurse Practitioners) who all work together to provide you with the care you need, when you need it.  We recommend signing up for the patient portal called "MyChart".  Sign up information is provided on this After Visit Summary.  MyChart is used to connect with patients for Virtual Visits (Telemedicine).  Patients are able to view lab/test results, encounter notes, upcoming appointments, etc.  Non-urgent messages can be sent to your provider as well.   To learn more about what you can do with MyChart, go to [email or web address].    Your next appointment:   1 year(s)  The format for your next appointment:   In Person  Provider:   [name], MD

## 2021-10-23 NOTE — Telephone Encounter (Signed)
Returned call to daughter Peter Congo) and she has already been contacted by Dr. Fuller Plan (last night) and Remo Lipps RN today. Patient is scheduled for an EGD with dilation on 10/26/21. She has to Hold her Eliquis starting 10/25/21

## 2021-10-23 NOTE — Telephone Encounter (Signed)
Patient with diagnosis of A fib on Eliquis for anticoagulation.    Procedure:  EGD with Dilation  Date of procedure: 10/26/21   CHA2DS2-VASc Score = 7  This indicates a 11.2% annual risk of stroke. The patient's score is based upon: CHF History: 0 HTN History: 1 Diabetes History: 0 Stroke History: 2 Vascular Disease History: 1 Age Score: 2 Gender Score: 1    CrCl 41 mL/min Platelet count 206K  Patient will be high risk off anticoagulation. Recommend 1 day Eliquis hold.

## 2021-10-23 NOTE — Telephone Encounter (Signed)
Pt daughter Kaitlin [name] states that Kaitlin [name] is in agreement with the Upper Endoscopy and Possible dilation. On 10/26/2021  Pt scheduled for 10/26/2021 at 10:00 AM. Pt to arrive at 9:00 AM [place] aware: Address Provided: Prep instructions sent to pt via My Chart. Kaitlin [name] made aware Ambulatory referral to GI Placed Clearance for the Eliquis sent to CV DIV PREOP and pt cardiologist.Dr. [name] Pt cardiologist: Kaitlin [name] was notified for the pt to HOLD the Eliquis the day before and the day of the procedure: Kaitlin [name] notified that we will verify this with pt cardiologist as well. Kaitlin [name] stated that pt has a Radio producer  today Kaitlin [name] Verbalized understanding with all questions answered.

## 2021-10-23 NOTE — Telephone Encounter (Signed)
EGD scheduled for 10/26/2021 at 10:00AM  Notified pt daughter Peter Congo . Pt daughter stated that she wants to call and  confirm with pt if she is willing to do this. Peter Congo notified that our Office will call her back around 10:00 AM this morning to verify.  Peter Congo Verbalized understanding with all questions answered.

## 2021-10-23 NOTE — Telephone Encounter (Signed)
Ba swallow suggests esophageal stricture causing swallowing problems  I have an opening 10 AM Friday this week Please schedule EGD and possible dilation for that spot  She needs to hold Eliquis day before and day of procedure so tell her to do that and we can confirm with cardiology but I do not think there is any other choice in her situation

## 2021-10-23 NOTE — Telephone Encounter (Signed)
[facility] Pre-operative Risk Assessment     Kaitlin [name] 1927-05-18 [id number]  Procedure: EGD with Dilation  Anesthesia type:  MAC Procedure Date: 10/26/2021 Provider: Dr. Carlean Purl  Type of Clearance needed: Pharmacy  Medication(s) needing held: Eliquis   Length of time for medication to be held: 2 days  Please review request and advise by either responding to this message or by sending your response to the fax # provided below.  Thank you,  [facility]  Phone: [phone] Fax: [phone] ATTENTION: AMMIE EVERSOLE, LPN

## 2021-10-23 NOTE — Telephone Encounter (Signed)
Patient has cardiology appointment with Dr. [name] today (10/23/2021).  I will wait till after appointment to review office note for cardiac clearance.  Jossie Ng. Broughton Eppinger NP-C    10/23/2021, 1:12 PM [facility] [address] Office [phone] Fax [phone]

## 2021-10-24 NOTE — Telephone Encounter (Signed)
Please see notes Below

## 2021-10-24 NOTE — Telephone Encounter (Signed)
Pt daughter Peter Congo Made aware that [place] has received Cardiac Clearance to hold Eliquis the day before and the day of the procedure: Peter Congo verbalized understanding and all questions answered

## 2021-10-24 NOTE — Telephone Encounter (Signed)
Pt daughter Peter Congo Made aware that [place] has received Cardiac Clearance to hold Eliquis the day before and the day of the procedure: Peter Congo verbalized understanding and all questions answered

## 2021-10-25 ENCOUNTER — Encounter: Payer: Self-pay | Admitting: Certified Registered Nurse Anesthetist

## 2021-10-25 NOTE — Telephone Encounter (Signed)
° ° °  Patient Name: Kaitlin [name]  DOB: [birth year] MRN: [id number]  Primary Cardiologist: [name], MD  Chart reviewed as part of pre-operative protocol coverage. Given past medical history and time since last visit, based on ACC/AHA guidelines, Shamirah Ivan would be at acceptable risk for the planned procedure without further cardiovascular testing.   See below at recommendation by clinical pharmacist regarding Eliquis. Patient was also cleared by Dr. [name] during office visit on 10/23/2021.   The patient was advised that if she develops new symptoms prior to surgery to contact our office to arrange for a follow-up visit, and she verbalized understanding.  I will route this recommendation to the requesting party via Epic fax function and remove from pre-op pool.  Please call with questions.  [place], [place] 10/25/2021, 10:49 AM

## 2021-10-26 ENCOUNTER — Encounter: Payer: Self-pay | Admitting: Internal Medicine

## 2021-10-26 ENCOUNTER — Ambulatory Visit (AMBULATORY_SURGERY_CENTER): Payer: Medicare Other | Admitting: Internal Medicine

## 2021-10-26 VITALS — BP 160/83 | HR 77 | Temp 98.3°F | Resp 18 | Ht 63.0 in | Wt 98.0 lb

## 2021-10-26 DIAGNOSIS — R1310 Dysphagia, unspecified: Secondary | ICD-10-CM

## 2021-10-26 DIAGNOSIS — K222 Esophageal obstruction: Secondary | ICD-10-CM

## 2021-10-26 DIAGNOSIS — K2970 Gastritis, unspecified, without bleeding: Secondary | ICD-10-CM

## 2021-10-26 DIAGNOSIS — K317 Polyp of stomach and duodenum: Secondary | ICD-10-CM | POA: Diagnosis not present

## 2021-10-26 DIAGNOSIS — K2950 Unspecified chronic gastritis without bleeding: Secondary | ICD-10-CM | POA: Diagnosis not present

## 2021-10-26 MED ORDER — SODIUM CHLORIDE 0.9 % IV SOLN: 500.0000 mL | Freq: Once | INTRAVENOUS | Status: DC

## 2021-10-26 NOTE — Progress Notes (Signed)
Report given to PACU, vss 

## 2021-10-26 NOTE — Progress Notes (Signed)
History and Physical Interval Note:  10/26/2021 10:30 AM  Kaitlin [name]  has presented today for endoscopic procedure(s), with the diagnosis of  Encounter Diagnosis  Name Primary?   Dysphagia, unspecified type Yes  .  The various methods of evaluation and treatment have been discussed with the patient and/or family. After consideration of risks, benefits and other options for treatment, the patient has consented to  the endoscopic procedure(s).   Ba swallow has suggested a distal esophageal stricture.   The patient's history has been reviewed, patient examined, no change in status, stable for endoscopic procedure(s).  I have reviewed the patient's chart and labs.  Questions were answered to the patient's satisfaction.     Gatha Mayer, MD, [facility]

## 2021-10-26 NOTE — Patient Instructions (Addendum)
There was a narrow area where esophagus and stomach join. Called a stricture. Food has been stopping here. I dilated it so you can swallow better.  The esophagus is also spastic so you may not be 100% better - we will see.  There was inflammation in the stomach (gastritis) - took biopsies. Also had a tiny polyp in the duodenum or upper intestine - removed.  Restart Eliquis tomorrow.  I will contact you with results and recommendations - I will most likely recommend acid-blocking medication to prevent stricture problems again as this is almost always from acid reflux.  I appreciate the opportunity to care for you. Gatha Mayer, MD, [facility]  Follow dilatation diet given to you today  Await pathology results on biopsies done   Resume Eliquis at prior dosage tomorrow    YOU HAD AN ENDOSCOPIC PROCEDURE TODAY AT [place]:   Refer to the procedure report that was given to you for any specific questions about what was found during the examination.  If the procedure report does not answer your questions, please call your gastroenterologist to clarify.  If you requested that your care partner not be given the details of your procedure findings, then the procedure report has been included in a sealed envelope for you to review at your convenience later.  YOU SHOULD EXPECT: Some feelings of bloating in the abdomen. Passage of more gas than usual.  Walking can help get rid of the air that was put into your GI tract during the procedure and reduce the bloating. If you had a lower endoscopy (such as a colonoscopy or flexible sigmoidoscopy) you may notice spotting of blood in your stool or on the toilet paper. If you underwent a bowel prep for your procedure, you may not have a normal bowel movement for a few days.  Please Note:  You might notice some irritation and congestion in your nose or some drainage.  This is from the oxygen used during your procedure.  There is no need for concern  and it should clear up in a day or so.  SYMPTOMS TO REPORT IMMEDIATELY:  Following upper endoscopy (EGD)  Vomiting of blood or coffee ground material  New chest pain or pain under the shoulder blades  Painful or persistently difficult swallowing  New shortness of breath  Fever of 100F or higher  Black, tarry-looking stools  For urgent or emergent issues, a gastroenterologist can be reached at any hour by calling [phone]. Do not use MyChart messaging for urgent concerns.    DIET:  [place]ow dilatation diet given to you today.  Drink plenty of fluids but you should avoid alcoholic beverages for 24 hours.  ACTIVITY:  You should plan to take it easy for the rest of today and you should NOT DRIVE or use heavy machinery until tomorrow (because of the sedation medicines used during the test).    FOLLOW UP: Our staff will call the number listed on your records 48-72 hours following your procedure to check on you and address any questions or concerns that you may have regarding the information given to you following your procedure. If we do not reach you, we will leave a message.  We will attempt to reach you two times.  During this call, we will ask if you have developed any symptoms of COVID 19. If you develop any symptoms (ie: fever, flu-like symptoms, shortness of breath, cough etc.) before then, please call [phone].  If you test positive for  Covid 19 in the 2 weeks post procedure, please call and report this information to [place].    If any biopsies were taken you will be contacted by phone or by letter within the next 1-3 weeks.  Please call us at [phone] if you have not heard about the biopsies in 3 weeks.    SIGNATURES/CONFIDENTIALITY: You and/or your care partner have signed paperwork which will be entered into your electronic medical record.  These signatures attest to the fact that that the information above on your After Visit Summary has been reviewed and is understood.   Full responsibility of the confidentiality of this discharge information lies with you and/or your care-partner.

## 2021-10-26 NOTE — Progress Notes (Signed)
VS by CW  Pt's states no medical or surgical changes since previsit or office visit.  

## 2021-10-26 NOTE — Op Note (Addendum)
[place] Patient Name: Kaitlin [name] Procedure Date: 10/26/2021 10:23 AM MRN: [id number] Endoscopist: Gatha Mayer , MD Age: 88 Referring MD:  Date of Birth: Feb 24, 1927 Gender: Female Account #: [id number] Procedure:                Upper GI endoscopy Indications:              Dysphagia, Suspected stricture of the esophagus,                            Abnormal cine-esophagram Medicines:                Propofol per Anesthesia, Monitored Anesthesia Care Procedure:                Pre-Anesthesia Assessment:                           - Prior to the procedure, a History and Physical                            was performed, and patient medications and                            allergies were reviewed. The patient's tolerance of                            previous anesthesia was also reviewed. The risks                            and benefits of the procedure and the sedation                            options and risks were discussed with the patient.                            All questions were answered, and informed consent                            was obtained. Prior Anticoagulants: The patient                            last took Eliquis (apixaban) 2 days prior to the                            procedure. ASA Grade Assessment: III - A patient                            with severe systemic disease. After reviewing the                            risks and benefits, the patient was deemed in                            satisfactory condition to undergo the procedure.  After obtaining informed consent, the endoscope was                            passed under direct vision. Throughout the                            procedure, the patient's blood pressure, pulse, and                            oxygen saturations were monitored continuously. The                            GIF [id number] #[id number] was introduced through the                             mouth, and advanced to the second part of duodenum.                            The upper GI endoscopy was accomplished without                            difficulty. The patient tolerated the procedure                            well. Scope In: Scope Out: Findings:                 The examined esophagus was moderately tortuous.                           One benign-appearing, intrinsic moderate                            (circumferential scarring or stenosis; an endoscope                            may pass) stenosis was found at the                            gastroesophageal junction. The stenosis was                            traversed. A TTS dilator was passed through the                            scope. Dilation with a 16-17-18 mm balloon dilator                            was performed to 18 mm. The dilation site was                            examined and showed mild mucosal disruption.                            Estimated blood loss was minimal.  The gastroesophageal flap valve was visualized                            endoscopically and classified as Hill Grade I                            (prominent fold, tight to endoscope).                           Diffuse moderate inflammation characterized by                            erythema, friability, granularity and mucus was                            found in the prepyloric region of the stomach.                            Biopsies were taken with a cold forceps for                            histology. Verification of patient identification                            for the specimen was done. Estimated blood loss was                            minimal.                           A single 3 mm sessile polyp was found in the                            duodenal bulb. The polyp was removed with a cold                            biopsy forceps. Resection and retrieval were                            complete.  Verification of patient identification                            for the specimen was done. Estimated blood loss was                            minimal.                           The exam was otherwise without abnormality.                           The cardia and gastric fundus were normal on                            retroflexion. Complications:  No immediate complications. Estimated Blood Loss:     Estimated blood loss was minimal. Impression:               - Tortuous esophagus.                           - Benign-appearing esophageal stenosis. Dilated.                           - Gastroesophageal flap valve classified as Hill                            Grade I (prominent fold, tight to endoscope).                           - Gastritis. Biopsied.                           - A single duodenal polyp. Resected and retrieved.                           - The examination was otherwise normal. Recommendation:           - Patient has a contact number available for                            emergencies. The signs and symptoms of potential                            delayed complications were discussed with the                            patient. Return to normal activities tomorrow.                            Written discharge instructions were provided to the                            patient.                           - Continue present medications.                           - Await pathology results.                           - Resume Eliquis (apixaban) at prior dose tomorrow.                           - Await pathology results. Suspect will at least                            recommend a daily PPI but want to see path first                           - Clear  liquids x 1 hour then soft foods rest of                            day. Start prior diet tomorrow. Gatha Mayer, MD 10/26/2021 10:57:46 AM This report has been signed electronically.

## 2021-10-26 NOTE — Progress Notes (Signed)
Called to room to assist during endoscopic procedure.  Patient ID and intended procedure confirmed with present staff. Received instructions for my participation in the procedure from the performing physician.  

## 2021-10-30 ENCOUNTER — Telehealth: Payer: Self-pay

## 2021-10-30 NOTE — Telephone Encounter (Signed)
°  Follow up Call-  Call back number 10/26/2021  Post procedure Call Back phone  # [phone]  Permission to leave phone message Yes  Some recent data might be hidden     Patient questions:  Do you have a fever, pain , or abdominal swelling? No. Pain Score  0 *  Have you tolerated food without any problems? Yes.    Have you been able to return to your normal activities? Yes.    Do you have any questions about your discharge instructions: Diet   No. Medications  No. Follow up visit  No.  Do you have questions or concerns about your Care? No.  Actions: * If pain score is 4 or above: No action needed, pain <4.  Pt said she was having constipation and asked if she could take senacot  I advised her that would be fine to take.  Pt asked if normal to have a sore throat.. I advised her it was common.  To try a warm salt water gargle, cepcol loz or cough drops to melt and coat her throat.  If sx do not improve to call us back.  She said she is swallowing food much better.   Have you developed a fever since your procedure? no  2.   Have you had an respiratory symptoms (SOB or cough) since your procedure? no  3.   Have you tested positive for COVID 19 since your procedure no  4.   Have you had any family members/close contacts diagnosed with the COVID 19 since your procedure?  no   If yes to any of these questions please route to Joylene John, RN and Joella Prince, RN

## 2021-11-02 ENCOUNTER — Other Ambulatory Visit: Payer: Self-pay

## 2021-11-02 DIAGNOSIS — K2970 Gastritis, unspecified, without bleeding: Secondary | ICD-10-CM

## 2021-11-02 MED ORDER — OMEPRAZOLE 20 MG PO CPDR: 20.0000 mg | DELAYED_RELEASE_CAPSULE | Freq: Every day | ORAL | 3 refills | Status: DC

## 2021-11-06 ENCOUNTER — Other Ambulatory Visit: Payer: Self-pay | Admitting: Interventional Cardiology

## 2021-11-06 NOTE — Telephone Encounter (Signed)
Pt last saw Dr [name] 10/23/21, last labs 07/04/21 Creat 0.61, age 88, weight 44.5kg, based on specified criteria pt is on appropriate dosage of Eliquis 2.5mg  BID for afib.  Will refill rx.

## 2021-12-10 DIAGNOSIS — H40023 Open angle with borderline findings, high risk, bilateral: Secondary | ICD-10-CM | POA: Diagnosis not present

## 2021-12-10 DIAGNOSIS — H353132 Nonexudative age-related macular degeneration, bilateral, intermediate dry stage: Secondary | ICD-10-CM | POA: Diagnosis not present

## 2021-12-10 DIAGNOSIS — H04123 Dry eye syndrome of bilateral lacrimal glands: Secondary | ICD-10-CM | POA: Diagnosis not present

## 2021-12-13 DIAGNOSIS — M25551 Pain in right hip: Secondary | ICD-10-CM | POA: Diagnosis not present

## 2021-12-13 DIAGNOSIS — M5459 Other low back pain: Secondary | ICD-10-CM | POA: Diagnosis not present

## 2021-12-13 DIAGNOSIS — G8929 Other chronic pain: Secondary | ICD-10-CM | POA: Diagnosis not present

## 2021-12-13 DIAGNOSIS — R2689 Other abnormalities of gait and mobility: Secondary | ICD-10-CM | POA: Diagnosis not present

## 2021-12-17 ENCOUNTER — Ambulatory Visit (INDEPENDENT_AMBULATORY_CARE_PROVIDER_SITE_OTHER): Payer: Medicare Other | Admitting: Podiatry

## 2021-12-17 ENCOUNTER — Other Ambulatory Visit: Payer: Self-pay

## 2021-12-17 DIAGNOSIS — M79674 Pain in right toe(s): Secondary | ICD-10-CM

## 2021-12-17 DIAGNOSIS — B351 Tinea unguium: Secondary | ICD-10-CM | POA: Diagnosis not present

## 2021-12-17 DIAGNOSIS — M79675 Pain in left toe(s): Secondary | ICD-10-CM

## 2021-12-17 DIAGNOSIS — Z7901 Long term (current) use of anticoagulants: Secondary | ICD-10-CM | POA: Diagnosis not present

## 2021-12-17 DIAGNOSIS — L84 Corns and callosities: Secondary | ICD-10-CM

## 2021-12-20 NOTE — Progress Notes (Signed)
Subjective: ?88 year old female presents the office today for concerns of thick, elongated toenails that she cannot trim her self as well as for calluses submetatarsal 5 bilaterally.  She denies any open lesions.  No swelling or redness or any drainage.  She has no other concerns today. ? ?Of note she is going to physical therapy for other issues.  Due to neuropathy she has some gait instability. ? ?Objective: ?AAO x3, NAD ?DP/PT pulses palpable bilaterally, CRT less than 3 seconds ?Nails are hypertrophic, dystrophic, brittle, discolored, elongated ?10. No surrounding redness or drainage. Tenderness nails 1-5 bilaterally. ?Hyperkeratotic lesions noted bilateral submetatarsal 5 without any underlying ulceration drainage or any signs of infection today. ?No pain with calf compression, swelling, warmth, erythema ? ?Assessment: ?Symptomatic onychomycosis, hyperkeratotic lesion ? ?Plan: ?-All treatment options discussed with the patient including all alternatives, risks, complications.  ?-Sharp debrided nails x10 without any complications or bleeding ?-Sharply debrided the hyperkeratotic lesions x2 without any complications or bleeding ?-She is already going to physical therapy and hope this to be helpful for her gait stability. ?-Daily foot inspection. ?-Patient encouraged to call the office with any questions, concerns, change in symptoms.  ? ?Trula Slade DPM ? ?

## 2021-12-21 DIAGNOSIS — R2689 Other abnormalities of gait and mobility: Secondary | ICD-10-CM | POA: Diagnosis not present

## 2021-12-21 DIAGNOSIS — G8929 Other chronic pain: Secondary | ICD-10-CM | POA: Diagnosis not present

## 2021-12-21 DIAGNOSIS — M5459 Other low back pain: Secondary | ICD-10-CM | POA: Diagnosis not present

## 2021-12-21 DIAGNOSIS — M25551 Pain in right hip: Secondary | ICD-10-CM | POA: Diagnosis not present

## 2021-12-25 DIAGNOSIS — M5459 Other low back pain: Secondary | ICD-10-CM | POA: Diagnosis not present

## 2021-12-25 DIAGNOSIS — M25551 Pain in right hip: Secondary | ICD-10-CM | POA: Diagnosis not present

## 2021-12-25 DIAGNOSIS — R2689 Other abnormalities of gait and mobility: Secondary | ICD-10-CM | POA: Diagnosis not present

## 2021-12-25 DIAGNOSIS — G8929 Other chronic pain: Secondary | ICD-10-CM | POA: Diagnosis not present

## 2021-12-26 ENCOUNTER — Ambulatory Visit: Payer: Medicare Other | Admitting: Internal Medicine

## 2021-12-26 ENCOUNTER — Encounter: Payer: Self-pay | Admitting: Internal Medicine

## 2021-12-26 ENCOUNTER — Ambulatory Visit (INDEPENDENT_AMBULATORY_CARE_PROVIDER_SITE_OTHER): Payer: Medicare Other | Admitting: Internal Medicine

## 2021-12-26 VITALS — BP 110/70 | HR 72 | Ht 65.0 in | Wt 96.6 lb

## 2021-12-26 DIAGNOSIS — I6389 Other cerebral infarction: Secondary | ICD-10-CM | POA: Diagnosis not present

## 2021-12-26 DIAGNOSIS — K222 Esophageal obstruction: Secondary | ICD-10-CM | POA: Diagnosis not present

## 2021-12-26 DIAGNOSIS — K219 Gastro-esophageal reflux disease without esophagitis: Secondary | ICD-10-CM

## 2021-12-26 NOTE — Patient Instructions (Addendum)
Start Omeprazole 20 mg daily 30-60 minutes before breakfast, continue even when feeling well.  ? ?Follow up as needed. ? ?If you are age 86 or older, your body mass index should be between 23-30. Your Body mass index is 16.08 kg/m?[name] If this is out of the aforementioned range listed, please consider follow up with your Primary Care Provider. ? ?If you are age 74 or younger, your body mass index should be between 19-25. Your Body mass index is 16.08 kg/m?[name] If this is out of the aformentioned range listed, please consider follow up with your Primary Care Provider.  ? ?________________________________________________________ ? ?The [facility] providers would like to encourage you to use [facility] to communicate with providers for non-urgent requests or questions.  Due to long hold times on the telephone, sending your provider a message by [facility] may be a faster and more efficient way to get a response.  Please allow 48 business hours for a response.  Please remember that this is for non-urgent requests.  ?_______________________________________________________ ? ? ?I appreciate the opportunity to care for you. ?Silvano Rusk, MD, [facility] ?

## 2021-12-26 NOTE — Progress Notes (Signed)
? ?Kaitlin [name] y.o. 1927/08/09 [id number] ? ?Assessment & Plan:  ? ?Encounter Diagnosis  ?Name Primary?  ? GERD with stricture Yes  ? ? ? ?We reviewed the indications for chronic PPI and how it can reduce the need for repeat esophageal dilation which we would like to avoid in somebody in their 90s. ? ?She says she will take the omeprazole and she may follow-up as needed. ? ? ?CC: [name], MD ? ? ? ?Subjective:  ? ?Chief Complaint: GERD with stricture ? ?HPI ?The patient is here with her daughter, she had an EGD with dilation of esophageal stricture to 18 mm on October 26, 2021, some chronic gastritis but no H. pylori, and some foveolar hyperplasia of the duodenum and the bulb.  I recommended she take omeprazole 20 mg daily.  She got the medication but did not fill it as she was concerned because she "reacts strangely to a lot of medications".  She says she is willing to take it.  She has been taking Tums twice a day and still has some sore throat issues.  Dysphagia has resolved. ?Allergies  ?Allergen Reactions  ? Dilaudid [Hydromorphone Hcl] Other (See Comments)  ?  Dropped heart rate really low  ? Ciprofloxacin Other (See Comments)  ?  Dizziness  ? Doxycycline   ?  Sever dizziness and nausea  ? Gabapentin   ?  Loss of balance, "weird feeling"  ? Codeine Nausea And Vomiting and Rash  ? Morphine And Related Nausea And Vomiting and Rash  ? Nizatidine Other (See Comments)  ?  Unknown reaction   ? ?Current Meds  ?Medication Sig  ? ALPRAZolam (XANAX) 0.25 MG tablet Take 0.125 mg by mouth 3 (three) times daily as needed for anxiety. For sleep  ? amoxicillin (AMOXIL) 500 MG tablet Take 500 mg by mouth as needed (before dental appointments).  ? B Complex-C (SUPER B COMPLEX) TABS Take 1 tablet by mouth daily.    ? calcium carbonate (TUMS - DOSED IN MG ELEMENTAL CALCIUM) 500 MG chewable tablet Chew 1 tablet by mouth 2 (two) times daily.  ? cholecalciferol (VITAMIN D) 1000 units tablet Take 1,000 Units  by mouth daily.  ? ELIQUIS 2.5 MG TABS tablet TAKE 1 TABLET BY MOUTH TWICE A DAY  ? HYDROcodone-acetaminophen (NORCO/VICODIN) 5-325 MG per tablet Take 1 tablet by mouth every 6 (six) hours as needed for moderate pain.  ? nitroGLYCERIN (NITROSTAT) 0.4 MG SL tablet Place 0.4 mg under the tongue every 5 (five) minutes as needed for chest pain.  ? Omega-3 Fatty Acids (FISH OIL) 1000 MG CAPS Take 1,000 mg by mouth daily.  ? polyethylene glycol (MIRALAX / GLYCOLAX) 17 g packet Take 17 g by mouth daily.  ? ?Past Medical History:  ?Diagnosis Date  ? Anxiety and depression   ? Atrial fibrillation ([place])   ? Chest pain   ? a. 03/2005 MV: Ef 79%, no ischemia.  ? CVA (cerebral vascular accident) [facility])   ? small lacunar infarcts in the left cerebellum and pons in 02/2017  ? Diverticulosis   ? Fever, recurrent   ? GERD (gastroesophageal reflux disease)   ? Heart attack ([place])   ? History of colon polyps   ? IBS (irritable bowel syndrome)   ? Insomnia   ? Laryngeal trauma   ? penetration  ? Lumbar back pain   ? Mitral valve prolapse   ? Osteoarthritis   ? Peripheral neuropathy   ? Tachy-brady syndrome ([place])   ?  a. Post termination of 4 seconds - refused PPM.  ? ?Past Surgical History:  ?Procedure Laterality Date  ? ABDOMINAL HYSTERECTOMY    ? BACK SURGERY    ? CHOLECYSTECTOMY    ? COLONOSCOPY  05/17/2010  ? diverticulosis  ? LAPAROTOMY    ? LEFT HEART CATHETERIZATION WITH CORONARY ANGIOGRAM N/A 01/26/2014  ? Procedure: LEFT HEART CATHETERIZATION WITH CORONARY ANGIOGRAM;  Surgeon: [name], MD;  [facility] LAB;  Service: Cardiovascular;  Laterality: N/A;  ? NOSE SURGERY    ? UPPER GASTROINTESTINAL ENDOSCOPY  02/03/2007  ? normal  ? ?Social History  ? ?Social History Narrative  ? Lives alone   ? Right handed  ? Caffeine: 2 cups coffee in the mornings, sometimes drinks a coke.  ? ?family history includes Arthritis in her brother; Bone cancer in her maternal grandfather; Diabetes in her father; Healthy in her brother, brother,  and sister; Heart disease in her mother and sister; Other in her brother; Prostate cancer in her paternal grandfather; Stroke in her brother; Thyroid cancer in her sister. ? ? ?Review of Systems ?As above ? ?Objective:  ? Physical Exam ?BP 110/70   Pulse 72   Ht '5\' 5"'$  (1.651 m)   Wt 96 lb 9.6 oz (43.8 kg)   BMI 16.08 kg/m?  ? ? ?

## 2022-01-01 DIAGNOSIS — M25551 Pain in right hip: Secondary | ICD-10-CM | POA: Diagnosis not present

## 2022-01-01 DIAGNOSIS — R2689 Other abnormalities of gait and mobility: Secondary | ICD-10-CM | POA: Diagnosis not present

## 2022-01-01 DIAGNOSIS — M5459 Other low back pain: Secondary | ICD-10-CM | POA: Diagnosis not present

## 2022-01-01 DIAGNOSIS — G8929 Other chronic pain: Secondary | ICD-10-CM | POA: Diagnosis not present

## 2022-01-29 DIAGNOSIS — M546 Pain in thoracic spine: Secondary | ICD-10-CM | POA: Diagnosis not present

## 2022-01-29 DIAGNOSIS — R0609 Other forms of dyspnea: Secondary | ICD-10-CM | POA: Diagnosis not present

## 2022-01-29 DIAGNOSIS — R1319 Other dysphagia: Secondary | ICD-10-CM | POA: Diagnosis not present

## 2022-01-29 DIAGNOSIS — R911 Solitary pulmonary nodule: Secondary | ICD-10-CM | POA: Diagnosis not present

## 2022-01-29 DIAGNOSIS — R4589 Other symptoms and signs involving emotional state: Secondary | ICD-10-CM | POA: Diagnosis not present

## 2022-02-19 ENCOUNTER — Ambulatory Visit (HOSPITAL_COMMUNITY)
Admission: RE | Admit: 2022-02-19 | Discharge: 2022-02-19 | Disposition: A | Discharge status: Home / Self Care | Payer: Medicare Other | Source: Ambulatory Visit | Attending: Podiatry | Admitting: Podiatry

## 2022-02-19 ENCOUNTER — Ambulatory Visit (INDEPENDENT_AMBULATORY_CARE_PROVIDER_SITE_OTHER): Payer: Medicare Other | Admitting: Podiatry

## 2022-02-19 DIAGNOSIS — M79675 Pain in left toe(s): Secondary | ICD-10-CM | POA: Diagnosis not present

## 2022-02-19 DIAGNOSIS — M79674 Pain in right toe(s): Secondary | ICD-10-CM | POA: Diagnosis not present

## 2022-02-19 DIAGNOSIS — B351 Tinea unguium: Secondary | ICD-10-CM

## 2022-02-19 DIAGNOSIS — I6389 Other cerebral infarction: Secondary | ICD-10-CM

## 2022-02-19 DIAGNOSIS — L84 Corns and callosities: Secondary | ICD-10-CM

## 2022-02-19 DIAGNOSIS — M7989 Other specified soft tissue disorders: Secondary | ICD-10-CM

## 2022-02-19 DIAGNOSIS — Z7901 Long term (current) use of anticoagulants: Secondary | ICD-10-CM | POA: Diagnosis not present

## 2022-02-23 NOTE — Progress Notes (Signed)
Subjective: ?88 year old female presents the office today for concerns of thick, elongated toenails that she cannot trim her self as well as for calluses submetatarsal 5 bilaterally.  She denies any open lesions.  No swelling or redness or any drainage.  Also she states that her right leg became swollen yesterday and was hard.  Today it is better.  She is on Eliquis.  She has no chest pain or shortness of breath.  She has no other concerns today. ? ?[name], MD ?Last seen December 24, 2021 ? ?Objective: ?AAO x3, NAD ?DP/PT pulses palpable bilaterally, CRT less than 3 seconds ?Right calf is slightly swollen compared to the left side.  Appears to be supple today.  There is no erythema or warmth or any drainage or signs of infection. ?Nails are hypertrophic, dystrophic, brittle, discolored, elongated ?10. No surrounding redness or drainage. Tenderness nails 1-5 bilaterally. ?Hyperkeratotic lesions noted bilateral submetatarsal 5 without any underlying ulceration drainage or any signs of infection today. ? ?Assessment: ?Symptomatic onychomycosis, hyperkeratotic lesion; leg swelling ? ?Plan: ?-All treatment options discussed with the patient including all alternatives, risks, complications.  ?-Sharp debrided nails x10 without any complications or bleeding ?-Sharply debrided the hyperkeratotic lesions x2 without any complications or bleeding ?-Although her symptoms are improved today I ordered a venous duplex to rule out DVT.  If symptoms recur she needs to let us, her primary care doctor know or go to the emergency room. ?-Daily foot inspection. ?-Patient encouraged to call the office with any questions, concerns, change in symptoms.  ? ?Trula Slade DPM ? ?

## 2022-03-18 ENCOUNTER — Ambulatory Visit
Admission: RE | Admit: 2022-03-18 | Discharge: 2022-03-18 | Disposition: A | Discharge status: Home / Self Care | Payer: Medicare Other | Source: Ambulatory Visit | Attending: Internal Medicine | Admitting: Internal Medicine

## 2022-03-18 ENCOUNTER — Other Ambulatory Visit: Payer: Self-pay | Admitting: Internal Medicine

## 2022-03-18 DIAGNOSIS — M4727 Other spondylosis with radiculopathy, lumbosacral region: Secondary | ICD-10-CM | POA: Diagnosis not present

## 2022-03-18 DIAGNOSIS — E440 Moderate protein-calorie malnutrition: Secondary | ICD-10-CM | POA: Diagnosis not present

## 2022-03-18 DIAGNOSIS — R0781 Pleurodynia: Secondary | ICD-10-CM

## 2022-03-18 DIAGNOSIS — R269 Unspecified abnormalities of gait and mobility: Secondary | ICD-10-CM | POA: Diagnosis not present

## 2022-03-18 DIAGNOSIS — D472 Monoclonal gammopathy: Secondary | ICD-10-CM | POA: Diagnosis not present

## 2022-03-18 DIAGNOSIS — G8929 Other chronic pain: Secondary | ICD-10-CM | POA: Diagnosis not present

## 2022-03-18 DIAGNOSIS — I071 Rheumatic tricuspid insufficiency: Secondary | ICD-10-CM | POA: Diagnosis not present

## 2022-03-18 DIAGNOSIS — I509 Heart failure, unspecified: Secondary | ICD-10-CM | POA: Diagnosis not present

## 2022-03-18 DIAGNOSIS — R918 Other nonspecific abnormal finding of lung field: Secondary | ICD-10-CM | POA: Diagnosis not present

## 2022-03-18 DIAGNOSIS — I482 Chronic atrial fibrillation, unspecified: Secondary | ICD-10-CM | POA: Diagnosis not present

## 2022-03-18 DIAGNOSIS — M5417 Radiculopathy, lumbosacral region: Secondary | ICD-10-CM | POA: Diagnosis not present

## 2022-05-07 ENCOUNTER — Other Ambulatory Visit: Payer: Self-pay | Admitting: Interventional Cardiology

## 2022-05-07 DIAGNOSIS — I480 Paroxysmal atrial fibrillation: Secondary | ICD-10-CM

## 2022-05-07 NOTE — Telephone Encounter (Signed)
Prescription refill request for Eliquis received. Indication: Afib  Last office visit: 10/23/21 Kaitlin [name])  Scr:0.61 (07/04/21)  Age: 88 Weight: 43.8kg  Appropriate dose and refill sent to requested pharmacy.

## 2022-05-23 ENCOUNTER — Ambulatory Visit (INDEPENDENT_AMBULATORY_CARE_PROVIDER_SITE_OTHER): Payer: Medicare Other | Admitting: Podiatry

## 2022-05-23 NOTE — Progress Notes (Signed)
Patient had to leave today before her appointment without being seen.  Message sent to schedulers to reschedule.

## 2022-05-29 DIAGNOSIS — R5382 Chronic fatigue, unspecified: Secondary | ICD-10-CM | POA: Diagnosis not present

## 2022-05-29 DIAGNOSIS — G8929 Other chronic pain: Secondary | ICD-10-CM | POA: Diagnosis not present

## 2022-05-29 DIAGNOSIS — R0601 Orthopnea: Secondary | ICD-10-CM | POA: Diagnosis not present

## 2022-05-29 DIAGNOSIS — E440 Moderate protein-calorie malnutrition: Secondary | ICD-10-CM | POA: Diagnosis not present

## 2022-05-29 DIAGNOSIS — R911 Solitary pulmonary nodule: Secondary | ICD-10-CM | POA: Diagnosis not present

## 2022-06-10 DIAGNOSIS — H04123 Dry eye syndrome of bilateral lacrimal glands: Secondary | ICD-10-CM | POA: Diagnosis not present

## 2022-06-10 DIAGNOSIS — H353132 Nonexudative age-related macular degeneration, bilateral, intermediate dry stage: Secondary | ICD-10-CM | POA: Diagnosis not present

## 2022-06-10 DIAGNOSIS — H40023 Open angle with borderline findings, high risk, bilateral: Secondary | ICD-10-CM | POA: Diagnosis not present

## 2022-06-14 ENCOUNTER — Ambulatory Visit (INDEPENDENT_AMBULATORY_CARE_PROVIDER_SITE_OTHER): Payer: Medicare Other | Admitting: Podiatry

## 2022-06-14 DIAGNOSIS — G609 Hereditary and idiopathic neuropathy, unspecified: Secondary | ICD-10-CM

## 2022-06-14 DIAGNOSIS — I872 Venous insufficiency (chronic) (peripheral): Secondary | ICD-10-CM

## 2022-06-14 DIAGNOSIS — L603 Nail dystrophy: Secondary | ICD-10-CM | POA: Diagnosis not present

## 2022-06-14 DIAGNOSIS — B351 Tinea unguium: Secondary | ICD-10-CM | POA: Diagnosis not present

## 2022-06-15 NOTE — Progress Notes (Signed)
  Subjective:  Patient ID: Nataliyah Packham, female    DOB: 02/21/1927,  MRN: [id number]  Chief Complaint  Patient presents with   Peripheral Neuropathy       pt having burning in between toes and pins and needles feeling on bottom/ rfc   Nail Problem    Thick painful toenails, 3 month follow up   Callouses    Painful callus lesions fifth submet       88 y.o. female presents with the above complaint. History confirmed with patient. presents the office today for concerns of thick, elongated toenails that she cannot trim her self as well as for calluses submetatarsal 5 bilaterally.  She denies any open lesions.  She also relates a history of burning pain as well as aching pain in both feet. Does not take any medication for her idiopathic neuropathy.   Objective:  Physical Exam: warm, good capillary refill, nail exam onychomycosis of the toenails and dystrophic nailsDP pulses palpable, PT pulses palpable, and protective sensation intact Left Foot: tenderness of the 5th metatarsal head  Right Foot: tenderness of the 5th metatarsal head   No images are attached to the encounter.  Assessment:   1. Idiopathic neuropathy   2. Venous (peripheral) insufficiency   3. Tinea unguium   4. Nail dystrophy      Plan:  Patient was evaluated and treated and all questions answered.  Painful porokeratosis/ pressure callus present sub 5th met head bilaterally, Arthritis, Metatarsalgia, Onychomycosis, Onychodystrophy, and bilateral -Nails palliatively debrided secondary to pain debrided nails 1-5 bilaterally with nail trimmer without issue. Continue monitoring for any issues with either foot and call with any concerns.   Return in about 3 months (around 09/13/2022).

## 2022-06-18 ENCOUNTER — Ambulatory Visit: Payer: Medicare Other | Admitting: Podiatry

## 2022-08-28 DIAGNOSIS — R682 Dry mouth, unspecified: Secondary | ICD-10-CM | POA: Diagnosis not present

## 2022-08-28 DIAGNOSIS — I4819 Other persistent atrial fibrillation: Secondary | ICD-10-CM | POA: Diagnosis not present

## 2022-08-28 DIAGNOSIS — H538 Other visual disturbances: Secondary | ICD-10-CM | POA: Diagnosis not present

## 2022-08-28 DIAGNOSIS — G8929 Other chronic pain: Secondary | ICD-10-CM | POA: Diagnosis not present

## 2022-08-28 DIAGNOSIS — G629 Polyneuropathy, unspecified: Secondary | ICD-10-CM | POA: Diagnosis not present

## 2022-08-28 DIAGNOSIS — H04123 Dry eye syndrome of bilateral lacrimal glands: Secondary | ICD-10-CM | POA: Diagnosis not present

## 2022-08-28 DIAGNOSIS — R0602 Shortness of breath: Secondary | ICD-10-CM | POA: Diagnosis not present

## 2022-08-28 DIAGNOSIS — E440 Moderate protein-calorie malnutrition: Secondary | ICD-10-CM | POA: Diagnosis not present

## 2022-09-11 DIAGNOSIS — M79641 Pain in right hand: Secondary | ICD-10-CM | POA: Diagnosis not present

## 2022-09-11 DIAGNOSIS — G8929 Other chronic pain: Secondary | ICD-10-CM | POA: Diagnosis not present

## 2022-09-11 DIAGNOSIS — G629 Polyneuropathy, unspecified: Secondary | ICD-10-CM | POA: Diagnosis not present

## 2022-09-11 DIAGNOSIS — M549 Dorsalgia, unspecified: Secondary | ICD-10-CM | POA: Diagnosis not present

## 2022-09-11 DIAGNOSIS — M79642 Pain in left hand: Secondary | ICD-10-CM | POA: Diagnosis not present

## 2022-09-11 DIAGNOSIS — M79672 Pain in left foot: Secondary | ICD-10-CM | POA: Diagnosis not present

## 2022-09-11 DIAGNOSIS — M79671 Pain in right foot: Secondary | ICD-10-CM | POA: Diagnosis not present

## 2022-09-11 DIAGNOSIS — M1990 Unspecified osteoarthritis, unspecified site: Secondary | ICD-10-CM | POA: Diagnosis not present

## 2022-09-11 DIAGNOSIS — M2550 Pain in unspecified joint: Secondary | ICD-10-CM | POA: Diagnosis not present

## 2022-09-11 DIAGNOSIS — R768 Other specified abnormal immunological findings in serum: Secondary | ICD-10-CM | POA: Diagnosis not present

## 2022-09-18 ENCOUNTER — Inpatient Hospital Stay (HOSPITAL_COMMUNITY)
Admission: EM | Admit: 2022-09-18 | Discharge: 2022-09-30 | DRG: 377 | Disposition: A | Discharge status: Home / Self Care | Payer: Medicare Other | Attending: Internal Medicine | Admitting: Internal Medicine

## 2022-09-18 ENCOUNTER — Emergency Department (HOSPITAL_COMMUNITY): Payer: Medicare Other

## 2022-09-18 ENCOUNTER — Encounter (HOSPITAL_COMMUNITY): Payer: Self-pay

## 2022-09-18 DIAGNOSIS — R188 Other ascites: Secondary | ICD-10-CM | POA: Diagnosis not present

## 2022-09-18 DIAGNOSIS — K589 Irritable bowel syndrome without diarrhea: Secondary | ICD-10-CM | POA: Diagnosis present

## 2022-09-18 DIAGNOSIS — Z7189 Other specified counseling: Secondary | ICD-10-CM

## 2022-09-18 DIAGNOSIS — M5459 Other low back pain: Secondary | ICD-10-CM | POA: Insufficient documentation

## 2022-09-18 DIAGNOSIS — Q399 Congenital malformation of esophagus, unspecified: Secondary | ICD-10-CM | POA: Diagnosis not present

## 2022-09-18 DIAGNOSIS — I4819 Other persistent atrial fibrillation: Secondary | ICD-10-CM | POA: Diagnosis not present

## 2022-09-18 DIAGNOSIS — R531 Weakness: Secondary | ICD-10-CM | POA: Diagnosis not present

## 2022-09-18 DIAGNOSIS — F341 Dysthymic disorder: Secondary | ICD-10-CM | POA: Diagnosis not present

## 2022-09-18 DIAGNOSIS — N136 Pyonephrosis: Secondary | ICD-10-CM | POA: Diagnosis present

## 2022-09-18 DIAGNOSIS — N151 Renal and perinephric abscess: Secondary | ICD-10-CM | POA: Diagnosis present

## 2022-09-18 DIAGNOSIS — R911 Solitary pulmonary nodule: Secondary | ICD-10-CM | POA: Diagnosis present

## 2022-09-18 DIAGNOSIS — Z86010 Personal history of colonic polyps: Secondary | ICD-10-CM

## 2022-09-18 DIAGNOSIS — R54 Age-related physical debility: Secondary | ICD-10-CM | POA: Diagnosis present

## 2022-09-18 DIAGNOSIS — K922 Gastrointestinal hemorrhage, unspecified: Secondary | ICD-10-CM | POA: Diagnosis not present

## 2022-09-18 DIAGNOSIS — Z681 Body mass index (BMI) 19 or less, adult: Secondary | ICD-10-CM | POA: Diagnosis not present

## 2022-09-18 DIAGNOSIS — Z8673 Personal history of transient ischemic attack (TIA), and cerebral infarction without residual deficits: Secondary | ICD-10-CM | POA: Diagnosis not present

## 2022-09-18 DIAGNOSIS — Z823 Family history of stroke: Secondary | ICD-10-CM

## 2022-09-18 DIAGNOSIS — K5731 Diverticulosis of large intestine without perforation or abscess with bleeding: Secondary | ICD-10-CM | POA: Diagnosis not present

## 2022-09-18 DIAGNOSIS — Z8249 Family history of ischemic heart disease and other diseases of the circulatory system: Secondary | ICD-10-CM

## 2022-09-18 DIAGNOSIS — R636 Underweight: Secondary | ICD-10-CM | POA: Diagnosis present

## 2022-09-18 DIAGNOSIS — K5900 Constipation, unspecified: Secondary | ICD-10-CM | POA: Diagnosis not present

## 2022-09-18 DIAGNOSIS — D62 Acute posthemorrhagic anemia: Secondary | ICD-10-CM | POA: Diagnosis not present

## 2022-09-18 DIAGNOSIS — K219 Gastro-esophageal reflux disease without esophagitis: Secondary | ICD-10-CM | POA: Diagnosis present

## 2022-09-18 DIAGNOSIS — R918 Other nonspecific abnormal finding of lung field: Secondary | ICD-10-CM | POA: Diagnosis not present

## 2022-09-18 DIAGNOSIS — Z9049 Acquired absence of other specified parts of digestive tract: Secondary | ICD-10-CM

## 2022-09-18 DIAGNOSIS — Z885 Allergy status to narcotic agent status: Secondary | ICD-10-CM

## 2022-09-18 DIAGNOSIS — K921 Melena: Secondary | ICD-10-CM | POA: Diagnosis present

## 2022-09-18 DIAGNOSIS — R509 Fever, unspecified: Secondary | ICD-10-CM | POA: Diagnosis not present

## 2022-09-18 DIAGNOSIS — B9620 Unspecified Escherichia coli [E. coli] as the cause of diseases classified elsewhere: Secondary | ICD-10-CM | POA: Diagnosis present

## 2022-09-18 DIAGNOSIS — Z79899 Other long term (current) drug therapy: Secondary | ICD-10-CM

## 2022-09-18 DIAGNOSIS — R300 Dysuria: Secondary | ICD-10-CM | POA: Diagnosis not present

## 2022-09-18 DIAGNOSIS — Z90710 Acquired absence of both cervix and uterus: Secondary | ICD-10-CM

## 2022-09-18 DIAGNOSIS — R1084 Generalized abdominal pain: Secondary | ICD-10-CM | POA: Diagnosis not present

## 2022-09-18 DIAGNOSIS — G8929 Other chronic pain: Secondary | ICD-10-CM | POA: Diagnosis present

## 2022-09-18 DIAGNOSIS — R000 Tachycardia, unspecified: Secondary | ICD-10-CM | POA: Diagnosis not present

## 2022-09-18 DIAGNOSIS — M1990 Unspecified osteoarthritis, unspecified site: Secondary | ICD-10-CM | POA: Diagnosis present

## 2022-09-18 DIAGNOSIS — K8689 Other specified diseases of pancreas: Secondary | ICD-10-CM | POA: Diagnosis not present

## 2022-09-18 DIAGNOSIS — I495 Sick sinus syndrome: Secondary | ICD-10-CM | POA: Diagnosis present

## 2022-09-18 DIAGNOSIS — J9811 Atelectasis: Secondary | ICD-10-CM | POA: Diagnosis not present

## 2022-09-18 DIAGNOSIS — J984 Other disorders of lung: Secondary | ICD-10-CM | POA: Diagnosis not present

## 2022-09-18 DIAGNOSIS — I447 Left bundle-branch block, unspecified: Secondary | ICD-10-CM | POA: Diagnosis present

## 2022-09-18 DIAGNOSIS — M545 Low back pain, unspecified: Secondary | ICD-10-CM | POA: Diagnosis not present

## 2022-09-18 DIAGNOSIS — Z87440 Personal history of urinary (tract) infections: Secondary | ICD-10-CM

## 2022-09-18 DIAGNOSIS — I252 Old myocardial infarction: Secondary | ICD-10-CM | POA: Diagnosis not present

## 2022-09-18 DIAGNOSIS — I701 Atherosclerosis of renal artery: Secondary | ICD-10-CM | POA: Diagnosis not present

## 2022-09-18 DIAGNOSIS — Z66 Do not resuscitate: Secondary | ICD-10-CM | POA: Diagnosis not present

## 2022-09-18 DIAGNOSIS — J90 Pleural effusion, not elsewhere classified: Secondary | ICD-10-CM | POA: Diagnosis not present

## 2022-09-18 DIAGNOSIS — K625 Hemorrhage of anus and rectum: Secondary | ICD-10-CM | POA: Diagnosis not present

## 2022-09-18 DIAGNOSIS — G4700 Insomnia, unspecified: Secondary | ICD-10-CM | POA: Diagnosis present

## 2022-09-18 DIAGNOSIS — Z881 Allergy status to other antibiotic agents status: Secondary | ICD-10-CM

## 2022-09-18 DIAGNOSIS — K5730 Diverticulosis of large intestine without perforation or abscess without bleeding: Secondary | ICD-10-CM | POA: Diagnosis not present

## 2022-09-18 DIAGNOSIS — I700 Atherosclerosis of aorta: Secondary | ICD-10-CM | POA: Diagnosis not present

## 2022-09-18 DIAGNOSIS — R338 Other retention of urine: Secondary | ICD-10-CM | POA: Diagnosis not present

## 2022-09-18 DIAGNOSIS — F419 Anxiety disorder, unspecified: Secondary | ICD-10-CM | POA: Diagnosis present

## 2022-09-18 DIAGNOSIS — N3000 Acute cystitis without hematuria: Secondary | ICD-10-CM | POA: Diagnosis not present

## 2022-09-18 DIAGNOSIS — R634 Abnormal weight loss: Secondary | ICD-10-CM | POA: Diagnosis not present

## 2022-09-18 DIAGNOSIS — Z7901 Long term (current) use of anticoagulants: Secondary | ICD-10-CM

## 2022-09-18 DIAGNOSIS — F32A Depression, unspecified: Secondary | ICD-10-CM | POA: Diagnosis present

## 2022-09-18 DIAGNOSIS — Z8719 Personal history of other diseases of the digestive system: Secondary | ICD-10-CM

## 2022-09-18 DIAGNOSIS — Z888 Allergy status to other drugs, medicaments and biological substances status: Secondary | ICD-10-CM

## 2022-09-18 DIAGNOSIS — R109 Unspecified abdominal pain: Secondary | ICD-10-CM | POA: Diagnosis not present

## 2022-09-18 DIAGNOSIS — R58 Hemorrhage, not elsewhere classified: Secondary | ICD-10-CM | POA: Diagnosis not present

## 2022-09-18 LAB — CBC WITH DIFFERENTIAL/PLATELET
Abs Immature Granulocytes: 0.03 10*3/uL (ref 0.00–0.07)
Basophils Absolute: 0 10*3/uL (ref 0.0–0.1)
Basophils Relative: 0 %
Eosinophils Absolute: 0 10*3/uL (ref 0.0–0.5)
Eosinophils Relative: 0 %
HCT: 33.8 % — ABNORMAL LOW (ref 36.0–46.0)
Hemoglobin: 10.6 g/dL — ABNORMAL LOW (ref 12.0–15.0)
Immature Granulocytes: 0 %
Lymphocytes Relative: 13 %
Lymphs Abs: 0.9 10*3/uL (ref 0.7–4.0)
MCH: 30.2 pg (ref 26.0–34.0)
MCHC: 31.4 g/dL (ref 30.0–36.0)
MCV: 96.3 fL (ref 80.0–100.0)
Monocytes Absolute: 0.6 10*3/uL (ref 0.1–1.0)
Monocytes Relative: 8 %
Neutro Abs: 5.3 10*3/uL (ref 1.7–7.7)
Neutrophils Relative %: 79 %
Platelets: 172 10*3/uL (ref 150–400)
RBC: 3.51 MIL/uL — ABNORMAL LOW (ref 3.87–5.11)
RDW: 13.2 % (ref 11.5–15.5)
WBC: 6.8 10*3/uL (ref 4.0–10.5)
nRBC: 0 % (ref 0.0–0.2)

## 2022-09-18 LAB — I-STAT CHEM 8, ED
BUN: 20 mg/dL (ref 8–23)
Calcium, Ion: 0.89 mmol/L — CL (ref 1.15–1.40)
Chloride: 106 mmol/L (ref 98–111)
Creatinine, Ser: 0.3 mg/dL — ABNORMAL LOW (ref 0.44–1.00)
Glucose, Bld: 95 mg/dL (ref 70–99)
HCT: 21 % — ABNORMAL LOW (ref 36.0–46.0)
Hemoglobin: 7.1 g/dL — ABNORMAL LOW (ref 12.0–15.0)
Potassium: 3.5 mmol/L (ref 3.5–5.1)
Sodium: 138 mmol/L (ref 135–145)
TCO2: 22 mmol/L (ref 22–32)

## 2022-09-18 LAB — COMPREHENSIVE METABOLIC PANEL
ALT: 8 U/L (ref 0–44)
AST: 24 U/L (ref 15–41)
Albumin: 2.8 g/dL — ABNORMAL LOW (ref 3.5–5.0)
Alkaline Phosphatase: 39 U/L (ref 38–126)
Anion gap: 6 (ref 5–15)
BUN: 20 mg/dL (ref 8–23)
CO2: 22 mmol/L (ref 22–32)
Calcium: 7.6 mg/dL — ABNORMAL LOW (ref 8.9–10.3)
Chloride: 109 mmol/L (ref 98–111)
Creatinine, Ser: 0.38 mg/dL — ABNORMAL LOW (ref 0.44–1.00)
GFR, Estimated: >60 mL/min (ref >60)
Glucose, Bld: 111 mg/dL — ABNORMAL HIGH (ref 70–99)
Potassium: 4.1 mmol/L (ref 3.5–5.1)
Sodium: 137 mmol/L (ref 135–145)
Total Bilirubin: 0.7 mg/dL (ref 0.3–1.2)
Total Protein: 4.9 g/dL — ABNORMAL LOW (ref 6.5–8.1)

## 2022-09-18 LAB — LACTIC ACID, PLASMA: Lactic Acid, Venous: 1.2 mmol/L (ref 0.5–1.9)

## 2022-09-18 LAB — PREPARE RBC (CROSSMATCH)

## 2022-09-18 LAB — ABO/RH: ABO/RH(D): O POS

## 2022-09-18 LAB — PROTIME-INR
INR: 1.4 — ABNORMAL HIGH (ref 0.8–1.2)
Prothrombin Time: 16.6 seconds — ABNORMAL HIGH (ref 11.4–15.2)

## 2022-09-18 MED ORDER — ONDANSETRON HCL 4 MG/2ML IJ SOLN
4.0000 mg | Freq: Once | INTRAMUSCULAR | Status: AC
  Administered 2022-09-18: 4 mg via INTRAVENOUS
  Filled 2022-09-18: qty 2

## 2022-09-18 MED ORDER — IOHEXOL 350 MG/ML SOLN
90.0000 mL | Freq: Once | INTRAVENOUS | Status: AC | PRN
  Administered 2022-09-18: 90 mL via INTRAVENOUS

## 2022-09-18 MED ORDER — SODIUM CHLORIDE 0.9 % IV BOLUS
1000.0000 mL | Freq: Once | INTRAVENOUS | Status: AC
  Administered 2022-09-18: 1000 mL via INTRAVENOUS

## 2022-09-18 MED ORDER — PANTOPRAZOLE 80MG IVPB - SIMPLE MED
80.0000 mg | Freq: Once | INTRAVENOUS | Status: AC
  Administered 2022-09-18: 80 mg via INTRAVENOUS
  Filled 2022-09-18: qty 80

## 2022-09-18 MED ORDER — SODIUM CHLORIDE 0.9% IV SOLUTION: Freq: Once | INTRAVENOUS | Status: DC

## 2022-09-18 MED ORDER — SODIUM CHLORIDE 0.9 % IV SOLN: INTRAVENOUS | Status: DC

## 2022-09-18 MED ORDER — SODIUM CHLORIDE 0.9 % IV SOLN
10.0000 mL/h | Freq: Once | INTRAVENOUS | Status: AC
  Administered 2022-09-18: 10 mL/h via INTRAVENOUS

## 2022-09-18 MED ORDER — PROTHROMBIN COMPLEX CONC HUMAN 500 UNITS IV KIT
2150.0000 [IU] | PACK | Status: AC
  Administered 2022-09-18: 2150 [IU] via INTRAVENOUS
  Filled 2022-09-18: qty 2150

## 2022-09-18 MED ORDER — PANTOPRAZOLE INFUSION (NEW) - SIMPLE MED
8.0000 mg/h | INTRAVENOUS | Status: DC
  Administered 2022-09-18 – 2022-09-19 (×3): 8 mg/h via INTRAVENOUS
  Filled 2022-09-18 (×2): qty 80
  Filled 2022-09-18: qty 100
  Filled 2022-09-18: qty 80

## 2022-09-18 NOTE — ED Triage Notes (Signed)
Pt coming from home alone. Complaining of rectal bleeding. Pt is on elequis. Pt has hx of heart attack and stroke. Pt had hx of afib.    Bp 140/90 Hr 70 afib- hx of afib Spo2 99 ra

## 2022-09-18 NOTE — ED Provider Notes (Signed)
[name] DEPT Provider Note   CSN: [id number] Arrival date & time: 09/18/22  2023     History {Add pertinent medical, surgical, social history, OB history to HPI:1} Chief Complaint  Patient presents with   Rectal Bleeding    Kaitlin [name] is a 88 y.o. female.  Patient from home with right bleeding for the past 8 hours.  She reports bright red blood per rectum associated with clots and no stool.  She does take Eliquis for history of atrial fibrillation.  Has generalized weakness and fatigue but denies any chest pain or shortness of breath worse than her baseline.  Denies any abdominal pain, dizziness or lightheadedness.  She does feel fatigued and some lightheadedness. Does have a history of polyps in the past and possibly diverticulosis.  Does have acid reflux.  IBS as well. No vomiting.  No fever.  No runny nose or sore throat  The history is provided by the patient and the EMS personnel.  Rectal Bleeding Associated symptoms: light-headedness        Home Medications Prior to Admission medications   Medication Sig Start Date End Date Taking? Authorizing Provider  ALPRAZolam (XANAX) 0.25 MG tablet Take 0.125 mg by mouth 3 (three) times daily as needed for anxiety. For sleep    [provider]  amoxicillin (AMOXIL) 500 MG tablet Take 500 mg by mouth as needed (before dental appointments). 02/16/20   [provider]  B Complex-C (SUPER B COMPLEX) TABS Take 1 tablet by mouth daily.      [provider]  calcium carbonate (TUMS - DOSED IN MG ELEMENTAL CALCIUM) 500 MG chewable tablet Chew 1 tablet by mouth 2 (two) times daily.    [provider]  cholecalciferol (VITAMIN D) 1000 units tablet Take 1,000 Units by mouth daily.    [provider]  ELIQUIS 2.5 MG TABS tablet TAKE 1 TABLET BY MOUTH TWICE A DAY 05/07/22   Belva Crome, MD  HYDROcodone-acetaminophen (NORCO/VICODIN) 5-325 MG per tablet Take 1  tablet by mouth every 6 (six) hours as needed for moderate pain.    [provider]  nitroGLYCERIN (NITROSTAT) 0.4 MG SL tablet Place 0.4 mg under the tongue every 5 (five) minutes as needed for chest pain.    [provider]  Omega-3 Fatty Acids (FISH OIL) 1000 MG CAPS Take 1,000 mg by mouth daily.    [provider]  omeprazole (PRILOSEC) 20 MG capsule Take 1 capsule (20 mg total) by mouth daily. Patient not taking: Reported on 12/26/2021 11/02/21   Gatha Mayer, MD  polyethylene glycol (MIRALAX / GLYCOLAX) 17 g packet Take 17 g by mouth daily.    [provider]      Allergies    Dilaudid [hydromorphone hcl], Ciprofloxacin, Doxycycline, Gabapentin, Codeine, Morphine and related, and Nizatidine    Review of Systems   Review of Systems  Constitutional:  Positive for fatigue. Negative for activity change and appetite change.  HENT:  Negative for congestion.   Respiratory:  Negative for cough, chest tightness and shortness of breath.   Gastrointestinal:  Positive for blood in stool and hematochezia.  Genitourinary:  Negative for dysuria and hematuria.  Musculoskeletal:  Negative for arthralgias and myalgias.  Skin:  Negative for rash.  Neurological:  Positive for weakness and light-headedness.   all other systems are negative except as noted in the HPI and PMH.    Physical Exam Updated Vital Signs BP 112/80   Pulse 64  Resp 17   Ht '5\' 5"'$  (1.651 m)   Wt 39.1 kg   SpO2 100%   BMI 14.33 kg/m  Physical Exam Vitals and nursing note reviewed.  Constitutional:      General: She is not in acute distress.    Appearance: She is well-developed. She is not ill-appearing.  HENT:     Head: Normocephalic and atraumatic.     Mouth/Throat:     Pharynx: No oropharyngeal exudate.  Eyes:     Conjunctiva/sclera: Conjunctivae normal.     Pupils: Pupils are equal, round, and reactive to light.  Neck:     Comments: No meningismus. Cardiovascular:     Rate  and Rhythm: Normal rate and regular rhythm.     Heart sounds: Normal heart sounds. No murmur heard. Pulmonary:     Effort: Pulmonary effort is normal. No respiratory distress.     Breath sounds: Normal breath sounds.  Abdominal:     Palpations: Abdomen is soft.     Tenderness: There is no abdominal tenderness. There is no guarding or rebound.     Comments: Equal femoral pulses bilaterally  Genitourinary:    Comments: Bright red blood in toilet bowl without stool.  Musculoskeletal:        General: No tenderness. Normal range of motion.     Cervical back: Normal range of motion and neck supple.  Skin:    General: Skin is warm.  Neurological:     Mental Status: She is alert and oriented to person, place, and time.     Cranial Nerves: No cranial nerve deficit.     Motor: No abnormal muscle tone.     Coordination: Coordination normal.     Comments: No ataxia on finger to nose bilaterally. No pronator drift. 5/5 strength throughout. CN 2-12 intact.Equal grip strength. Sensation intact.   Psychiatric:        Behavior: Behavior normal.     ED Results / Procedures / Treatments   Labs (all labs ordered are listed, but only abnormal results are displayed) Labs Reviewed  CBC WITH DIFFERENTIAL/PLATELET - Abnormal; Notable for the following components:      Result Value   RBC 3.51 (*)    Hemoglobin 10.6 (*)    HCT 33.8 (*)    All other components within normal limits  PROTIME-INR - Abnormal; Notable for the following components:   Prothrombin Time 16.6 (*)    INR 1.4 (*)    All other components within normal limits  I-STAT CHEM 8, ED - Abnormal; Notable for the following components:   Creatinine, Ser 0.30 (*)    Calcium, Ion 0.89 (*)    Hemoglobin 7.1 (*)    HCT 21.0 (*)    All other components within normal limits  LACTIC ACID, PLASMA  COMPREHENSIVE METABOLIC PANEL  POC OCCULT BLOOD, ED  TYPE AND SCREEN  PREPARE RBC (CROSSMATCH)    EKG None  Radiology No results  found.  Procedures .Critical Care  Performed by: Ezequiel Essex, MD Authorized by: Ezequiel Essex, MD   Critical care provider statement:    Critical care time (minutes):  60   Critical care time was exclusive of:  Separately billable procedures and treating other patients   Critical care was necessary to treat or prevent imminent or life-threatening deterioration of the following conditions: GI bleeding.   Critical care was time spent personally by me on the following activities:  Development of treatment plan with patient or surrogate, discussions with consultants, evaluation of patient's  response to treatment, examination of patient, ordering and review of laboratory studies, ordering and review of radiographic studies, ordering and performing treatments and interventions, pulse oximetry, re-evaluation of patient's condition, review of old charts and blood draw for specimens   I assumed direction of critical care for this patient from another provider in my specialty: no     Care discussed with: admitting provider     {Document cardiac monitor, telemetry assessment procedure when appropriate:1}  Medications Ordered in ED Medications  sodium chloride 0.9 % bolus 1,000 mL (has no administration in time range)    And  0.9 %  sodium chloride infusion (has no administration in time range)  pantoprazole (PROTONIX) 80 mg /NS 100 mL IVPB (has no administration in time range)  pantoprozole (PROTONIX) 80 mg /NS 100 mL infusion (has no administration in time range)  ondansetron (ZOFRAN) injection 4 mg (has no administration in time range)    ED Course/ Medical Decision Making/ A&P                           Bright red blood per rectum for the past 8 hours.  Vital stable for EMS.  Blood pressure 093 systolic and heart rate 81W.   Likely diverticular bleeding.  Hemoglobin 10 down from 15.  Patient and family agreeable to blood transfusion.  Blood pressure and heart rate remained stable.   Will attempt to obtain CTA scan to identify source of bleeding and see if anything can be intervened upon.  Initiate blood transfusion, reversal of Eliquis after discussion of risks and benefits.  Continue IV Protonix.  Discussed with Dr. Lorenso Courier of gastroenterology.  She agrees with plans, transfusion, CTA to assess for intervenable lesion.  No role for emergent colonoscopy unless IR was unable to embolize a bleeding vessel.  Patient agreeable to blood transfusion and Eliquis reversal.  Patient continues to have rectal bleeding, hemoglobin dropped from 10 to 7 over 1.5 hours. Emergent release blood ordered while typed blood still pending.  BP 299B systolic {Document critical care time when appropriate:1} {Document review of labs and clinical decision tools ie heart score, Chads2Vasc2 etc:1}  {Document your independent review of radiology images, and any outside records:1} {Document your discussion with family members, caretakers, and with consultants:1} {Document social determinants of health affecting pt's care:1} {Document your decision making why or why not admission, treatments were needed:1} Final Clinical Impression(s) / ED Diagnoses Final diagnoses:  None    Rx / DC Orders ED Discharge Orders     None

## 2022-09-19 ENCOUNTER — Encounter (HOSPITAL_COMMUNITY)
Admission: EM | Disposition: A | Discharge status: Home / Self Care | Payer: Self-pay | Source: Home / Self Care | Attending: Student

## 2022-09-19 ENCOUNTER — Other Ambulatory Visit: Payer: Self-pay

## 2022-09-19 ENCOUNTER — Ambulatory Visit: Payer: BLUE CROSS/BLUE SHIELD | Admitting: Podiatry

## 2022-09-19 DIAGNOSIS — R636 Underweight: Secondary | ICD-10-CM | POA: Insufficient documentation

## 2022-09-19 DIAGNOSIS — R911 Solitary pulmonary nodule: Secondary | ICD-10-CM | POA: Diagnosis present

## 2022-09-19 DIAGNOSIS — Z681 Body mass index (BMI) 19 or less, adult: Secondary | ICD-10-CM | POA: Diagnosis not present

## 2022-09-19 DIAGNOSIS — N136 Pyonephrosis: Secondary | ICD-10-CM | POA: Diagnosis present

## 2022-09-19 DIAGNOSIS — J90 Pleural effusion, not elsewhere classified: Secondary | ICD-10-CM | POA: Diagnosis not present

## 2022-09-19 DIAGNOSIS — Z8673 Personal history of transient ischemic attack (TIA), and cerebral infarction without residual deficits: Secondary | ICD-10-CM | POA: Diagnosis not present

## 2022-09-19 DIAGNOSIS — N3000 Acute cystitis without hematuria: Secondary | ICD-10-CM | POA: Diagnosis not present

## 2022-09-19 DIAGNOSIS — Q399 Congenital malformation of esophagus, unspecified: Secondary | ICD-10-CM

## 2022-09-19 DIAGNOSIS — R918 Other nonspecific abnormal finding of lung field: Secondary | ICD-10-CM | POA: Diagnosis not present

## 2022-09-19 DIAGNOSIS — G8929 Other chronic pain: Secondary | ICD-10-CM | POA: Insufficient documentation

## 2022-09-19 DIAGNOSIS — K219 Gastro-esophageal reflux disease without esophagitis: Secondary | ICD-10-CM | POA: Diagnosis present

## 2022-09-19 DIAGNOSIS — K5900 Constipation, unspecified: Secondary | ICD-10-CM | POA: Diagnosis not present

## 2022-09-19 DIAGNOSIS — B9620 Unspecified Escherichia coli [E. coli] as the cause of diseases classified elsewhere: Secondary | ICD-10-CM | POA: Diagnosis present

## 2022-09-19 DIAGNOSIS — I4819 Other persistent atrial fibrillation: Secondary | ICD-10-CM

## 2022-09-19 DIAGNOSIS — K8689 Other specified diseases of pancreas: Secondary | ICD-10-CM | POA: Diagnosis present

## 2022-09-19 DIAGNOSIS — D62 Acute posthemorrhagic anemia: Secondary | ICD-10-CM | POA: Insufficient documentation

## 2022-09-19 DIAGNOSIS — R338 Other retention of urine: Secondary | ICD-10-CM | POA: Diagnosis not present

## 2022-09-19 DIAGNOSIS — M545 Low back pain, unspecified: Secondary | ICD-10-CM | POA: Diagnosis present

## 2022-09-19 DIAGNOSIS — F419 Anxiety disorder, unspecified: Secondary | ICD-10-CM | POA: Diagnosis present

## 2022-09-19 DIAGNOSIS — K922 Gastrointestinal hemorrhage, unspecified: Secondary | ICD-10-CM | POA: Diagnosis present

## 2022-09-19 DIAGNOSIS — F32A Depression, unspecified: Secondary | ICD-10-CM | POA: Diagnosis present

## 2022-09-19 DIAGNOSIS — F341 Dysthymic disorder: Secondary | ICD-10-CM

## 2022-09-19 DIAGNOSIS — J9811 Atelectasis: Secondary | ICD-10-CM | POA: Diagnosis not present

## 2022-09-19 DIAGNOSIS — K589 Irritable bowel syndrome without diarrhea: Secondary | ICD-10-CM | POA: Diagnosis present

## 2022-09-19 DIAGNOSIS — N151 Renal and perinephric abscess: Secondary | ICD-10-CM | POA: Diagnosis present

## 2022-09-19 DIAGNOSIS — I252 Old myocardial infarction: Secondary | ICD-10-CM | POA: Diagnosis not present

## 2022-09-19 DIAGNOSIS — K5730 Diverticulosis of large intestine without perforation or abscess without bleeding: Secondary | ICD-10-CM | POA: Diagnosis not present

## 2022-09-19 DIAGNOSIS — R634 Abnormal weight loss: Secondary | ICD-10-CM

## 2022-09-19 DIAGNOSIS — Z7189 Other specified counseling: Secondary | ICD-10-CM | POA: Diagnosis not present

## 2022-09-19 DIAGNOSIS — I447 Left bundle-branch block, unspecified: Secondary | ICD-10-CM | POA: Diagnosis present

## 2022-09-19 DIAGNOSIS — R300 Dysuria: Secondary | ICD-10-CM | POA: Diagnosis not present

## 2022-09-19 DIAGNOSIS — G4700 Insomnia, unspecified: Secondary | ICD-10-CM | POA: Diagnosis present

## 2022-09-19 DIAGNOSIS — Z66 Do not resuscitate: Secondary | ICD-10-CM | POA: Diagnosis present

## 2022-09-19 DIAGNOSIS — R1084 Generalized abdominal pain: Secondary | ICD-10-CM | POA: Diagnosis not present

## 2022-09-19 DIAGNOSIS — R188 Other ascites: Secondary | ICD-10-CM | POA: Diagnosis not present

## 2022-09-19 DIAGNOSIS — K921 Melena: Secondary | ICD-10-CM | POA: Diagnosis present

## 2022-09-19 DIAGNOSIS — R54 Age-related physical debility: Secondary | ICD-10-CM | POA: Diagnosis present

## 2022-09-19 DIAGNOSIS — I495 Sick sinus syndrome: Secondary | ICD-10-CM | POA: Diagnosis present

## 2022-09-19 DIAGNOSIS — R509 Fever, unspecified: Secondary | ICD-10-CM | POA: Diagnosis not present

## 2022-09-19 DIAGNOSIS — K5731 Diverticulosis of large intestine without perforation or abscess with bleeding: Secondary | ICD-10-CM | POA: Diagnosis present

## 2022-09-19 DIAGNOSIS — R109 Unspecified abdominal pain: Secondary | ICD-10-CM | POA: Diagnosis not present

## 2022-09-19 DIAGNOSIS — J984 Other disorders of lung: Secondary | ICD-10-CM | POA: Diagnosis not present

## 2022-09-19 DIAGNOSIS — I700 Atherosclerosis of aorta: Secondary | ICD-10-CM | POA: Diagnosis not present

## 2022-09-19 HISTORY — PX: FLEXIBLE SIGMOIDOSCOPY: SHX5431

## 2022-09-19 HISTORY — PX: ESOPHAGOGASTRODUODENOSCOPY: SHX5428

## 2022-09-19 LAB — COMPREHENSIVE METABOLIC PANEL
ALT: 12 U/L (ref 0–44)
AST: 17 U/L (ref 15–41)
Albumin: 3 g/dL — ABNORMAL LOW (ref 3.5–5.0)
Alkaline Phosphatase: 37 U/L — ABNORMAL LOW (ref 38–126)
Anion gap: 4 — ABNORMAL LOW (ref 5–15)
BUN: 16 mg/dL (ref 8–23)
CO2: 25 mmol/L (ref 22–32)
Calcium: 7.8 mg/dL — ABNORMAL LOW (ref 8.9–10.3)
Chloride: 109 mmol/L (ref 98–111)
Creatinine, Ser: 0.56 mg/dL (ref 0.44–1.00)
GFR, Estimated: >60 mL/min (ref >60)
Glucose, Bld: 110 mg/dL — ABNORMAL HIGH (ref 70–99)
Potassium: 3.9 mmol/L (ref 3.5–5.1)
Sodium: 138 mmol/L (ref 135–145)
Total Bilirubin: 1.3 mg/dL — ABNORMAL HIGH (ref 0.3–1.2)
Total Protein: 5 g/dL — ABNORMAL LOW (ref 6.5–8.1)

## 2022-09-19 LAB — PROTIME-INR
INR: 1.4 — ABNORMAL HIGH (ref 0.8–1.2)
Prothrombin Time: 16.5 seconds — ABNORMAL HIGH (ref 11.4–15.2)

## 2022-09-19 LAB — CBC
HCT: 35.2 % — ABNORMAL LOW (ref 36.0–46.0)
Hemoglobin: 11.7 g/dL — ABNORMAL LOW (ref 12.0–15.0)
MCH: 30.7 pg (ref 26.0–34.0)
MCHC: 33.2 g/dL (ref 30.0–36.0)
MCV: 92.4 fL (ref 80.0–100.0)
Platelets: 145 10*3/uL — ABNORMAL LOW (ref 150–400)
RBC: 3.81 MIL/uL — ABNORMAL LOW (ref 3.87–5.11)
RDW: 14 % (ref 11.5–15.5)
WBC: 6 10*3/uL (ref 4.0–10.5)
nRBC: 0 % (ref 0.0–0.2)

## 2022-09-19 LAB — HEMOGLOBIN AND HEMATOCRIT, BLOOD
HCT: 30.8 % — ABNORMAL LOW (ref 36.0–46.0)
HCT: 26.4 % — ABNORMAL LOW (ref 36.0–46.0)
Hemoglobin: 10.1 g/dL — ABNORMAL LOW (ref 12.0–15.0)
Hemoglobin: 8.7 g/dL — ABNORMAL LOW (ref 12.0–15.0)

## 2022-09-19 LAB — MRSA NEXT GEN BY PCR, NASAL: MRSA by PCR Next Gen: NOT DETECTED

## 2022-09-19 LAB — PREPARE RBC (CROSSMATCH)

## 2022-09-19 SURGERY — EGD (ESOPHAGOGASTRODUODENOSCOPY)
Anesthesia: Moderate Sedation

## 2022-09-19 MED ORDER — ONDANSETRON HCL 4 MG PO TABS: 4.0000 mg | ORAL_TABLET | Freq: Four times a day (QID) | ORAL | Status: DC | PRN

## 2022-09-19 MED ORDER — SODIUM CHLORIDE 0.9 % IV SOLN: INTRAVENOUS | Status: DC

## 2022-09-19 MED ORDER — ACETAMINOPHEN 650 MG RE SUPP: 650.0000 mg | Freq: Four times a day (QID) | RECTAL | Status: DC | PRN

## 2022-09-19 MED ORDER — DEXTROSE IN LACTATED RINGERS 5 % IV SOLN: INTRAVENOUS | Status: DC

## 2022-09-19 MED ORDER — BUTAMBEN-TETRACAINE-BENZOCAINE 2-2-14 % EX AERO
INHALATION_SPRAY | CUTANEOUS | Status: DC | PRN
  Administered 2022-09-19: 1 via TOPICAL

## 2022-09-19 MED ORDER — SODIUM CHLORIDE 0.9% IV SOLUTION: Freq: Once | INTRAVENOUS | Status: AC

## 2022-09-19 MED ORDER — ONDANSETRON HCL 4 MG/2ML IJ SOLN
4.0000 mg | Freq: Four times a day (QID) | INTRAMUSCULAR | Status: DC | PRN
  Administered 2022-09-24 (×2): 4 mg via INTRAVENOUS
  Filled 2022-09-19 (×2): qty 2

## 2022-09-19 MED ORDER — ORAL CARE MOUTH RINSE: 15.0000 mL | OROMUCOSAL | Status: DC | PRN

## 2022-09-19 MED ORDER — SODIUM CHLORIDE 0.9 % IV BOLUS
500.0000 mL | Freq: Once | INTRAVENOUS | Status: AC
  Administered 2022-09-19: 500 mL via INTRAVENOUS

## 2022-09-19 MED ORDER — FENTANYL CITRATE PF 50 MCG/ML IJ SOSY
12.5000 ug | PREFILLED_SYRINGE | INTRAMUSCULAR | Status: DC | PRN
  Administered 2022-09-19 – 2022-09-20 (×5): 12.5 ug via INTRAVENOUS
  Filled 2022-09-19 (×5): qty 1

## 2022-09-19 MED ORDER — FENTANYL CITRATE (PF) 100 MCG/2ML IJ SOLN
INTRAMUSCULAR | Status: AC
  Filled 2022-09-19: qty 4

## 2022-09-19 MED ORDER — METOPROLOL TARTRATE 5 MG/5ML IV SOLN: 2.5000 mg | INTRAVENOUS | Status: DC | PRN

## 2022-09-19 MED ORDER — CHLORHEXIDINE GLUCONATE CLOTH 2 % EX PADS
6.0000 | MEDICATED_PAD | Freq: Every day | CUTANEOUS | Status: DC
  Administered 2022-09-19 – 2022-09-27 (×7): 6 via TOPICAL

## 2022-09-19 MED ORDER — FENTANYL CITRATE (PF) 100 MCG/2ML IJ SOLN
INTRAMUSCULAR | Status: DC | PRN
  Administered 2022-09-19: 25 ug via INTRAVENOUS

## 2022-09-19 MED ORDER — MIDAZOLAM HCL (PF) 5 MG/ML IJ SOLN
INTRAMUSCULAR | Status: DC | PRN
  Administered 2022-09-19 (×3): 1 mg via INTRAVENOUS

## 2022-09-19 MED ORDER — MIDAZOLAM HCL (PF) 5 MG/ML IJ SOLN
INTRAMUSCULAR | Status: AC
  Filled 2022-09-19: qty 2

## 2022-09-19 MED ORDER — LORAZEPAM 2 MG/ML IJ SOLN
0.2500 mg | Freq: Three times a day (TID) | INTRAMUSCULAR | Status: DC | PRN
  Administered 2022-09-19 – 2022-09-21 (×3): 0.25 mg via INTRAVENOUS
  Filled 2022-09-19 (×3): qty 1

## 2022-09-19 MED ORDER — ACETAMINOPHEN 325 MG PO TABS
650.0000 mg | ORAL_TABLET | Freq: Four times a day (QID) | ORAL | Status: DC | PRN
  Filled 2022-09-19: qty 2

## 2022-09-19 MED ORDER — SODIUM CHLORIDE 0.9% FLUSH
3.0000 mL | Freq: Two times a day (BID) | INTRAVENOUS | Status: DC
  Administered 2022-09-19 – 2022-09-30 (×13): 3 mL via INTRAVENOUS

## 2022-09-19 NOTE — Assessment & Plan Note (Signed)
Home meds on hold for now.

## 2022-09-19 NOTE — Op Note (Signed)
[facility] Patient Name: Kaitlin [name] Procedure Date: 09/19/2022 MRN: [id number] Attending MD: Gatha Mayer , MD, [id number] Date of Birth: 1927-07-29 CSN: [id number] Age: 88 Admit Type: Inpatient Procedure:                Flexible Sigmoidoscopy Indications:              Hematochezia Providers:                Gatha Mayer, MD, Dulcy Fanny, Benetta Spar, Technician Referring MD:              Medicines:                Midazolam 1 mg IV Complications:            No immediate complications. Estimated Blood Loss:     Estimated blood loss: none. Procedure:                Pre-Anesthesia Assessment:                           - Prior to the procedure, a History and Physical                            was performed, and patient medications and                            allergies were reviewed. The patient's tolerance of                            previous anesthesia was also reviewed. The risks                            and benefits of the procedure and the sedation                            options and risks were discussed with the patient.                            All questions were answered, and informed consent                            was obtained. Prior Anticoagulants: The patient                            last took Eliquis (apixaban) 1 day prior to the                            procedure. ASA Grade Assessment: III - A patient                            with severe systemic disease. After reviewing the  risks and benefits, the patient was deemed in                            satisfactory condition to undergo the procedure.                           After obtaining informed consent, the scope was                            passed under direct vision. The [id number]-HQ190L                            ([id number]) Olympus colonoscope was introduced                            through the anus and advanced to  the the sigmoid                            colon. The flexible sigmoidoscopy was somewhat                            difficult due to poor endoscopic visualization and                            a tortuous colon. The quality of the bowel                            preparation was none. Scope In: Scope Out: Findings:      The perianal and digital rectal examinations were normal.      Red blood was found in the rectum and in the sigmoid colon.      Multiple diverticula were found in the sigmoid colon. There was       narrowing of the colon in association with the diverticular opening. Impression:               - Blood in the rectum and in the sigmoid colon.                            There was a transition from all blood to dark stool                            mixed w/ blood in proximal sigmoid but not any                            clear change from bloody to no blood.                           - Severe diverticulosis in the sigmoid colon. There                            was narrowing of the colon in association with the  diverticular opening.                           - No specimens collected. Moderate Sedation:      Moderate (conscious) sedation was administered by the nurse and       supervised by the endoscopist. The following parameters were monitored:       oxygen saturation, heart rate, blood pressure, respiratory rate, EKG,       adequacy of pulmonary ventilation, and response to care. Total physician       intraservice time was 8 minutes. Recommendation:           - remain in ICU                           stop PPI                           Support - transfuse to keep Hgb > 8                           At this point no further role for endoscopic                            evaluation - think most likely a sigmoid                            diverticular bleed though was not able to see a                            transition to no blood due to tortuous  colon and                            difficulty advancing scope further as she was                            uncomfortable                           Could consider repeat CT-A if clinical scenario                            indicates hoping to get lucky enough to see site of                            bleeding Procedure Code(s):        --- Professional ---                           [phone], Sigmoidoscopy, flexible; diagnostic,                            including collection of specimen(s) by brushing or                            washing, when performed (separate procedure) Diagnosis Code(s):        ---  Professional ---                           K62.5, Hemorrhage of anus and rectum                           K92.2, Gastrointestinal hemorrhage, unspecified                           K92.1, Melena (includes Hematochezia)                           K57.30, Diverticulosis of large intestine without                            perforation or abscess without bleeding CPT copyright 2022 American [employer] Association. All rights reserved. The codes documented in this report are preliminary and upon coder review may  be revised to meet current compliance requirements. Gatha Mayer, MD 09/19/2022 5:27:14 PM This report has been signed electronically. Number of Addenda: 0
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
## 2022-09-19 NOTE — Op Note (Signed)
[facility] Patient Name: Kaitlin [name] Procedure Date: 09/19/2022 MRN: [id number] Attending MD: Gatha Mayer , MD, [id number] Date of Birth: [birth year] CSN: [id number] Age: 88 Admit Type: Inpatient Procedure:                Upper GI endoscopy Indications:              Hematochezia Providers:                Gatha Mayer, MD, Dulcy Fanny, Benetta Spar, Technician Referring MD:              Medicines:                Meperidine 25 mg IV, Midazolam 2 mg IV, Cetacaine                            spray Complications:            No immediate complications. Estimated Blood Loss:     Estimated blood loss was minimal. Procedure:                Pre-Anesthesia Assessment:                           - Prior to the procedure, a History and Physical                            was performed, and patient medications and                            allergies were reviewed. The patient's tolerance of                            previous anesthesia was also reviewed. The risks                            and benefits of the procedure and the sedation                            options and risks were discussed with the patient.                            All questions were answered, and informed consent                            was obtained. Prior Anticoagulants: The patient                            last took Eliquis (apixaban) 1 day prior to the                            procedure. ASA Grade Assessment: III - A patient  with severe systemic disease. After reviewing the                            risks and benefits, the patient was deemed in                            satisfactory condition to undergo the procedure.                           After obtaining informed consent, the endoscope was                            passed under direct vision. Throughout the                            procedure, the patient's blood  pressure, pulse, and                            oxygen saturations were monitored continuously. The                            [id number]-H190 ([id number]) Olympus endoscope was introduced                            through the mouth, and advanced to the second part                            of duodenum. The upper GI endoscopy was                            accomplished without difficulty. The patient                            tolerated the procedure well. Scope In: Scope Out: Findings:      The examined esophagus was mildly tortuous.      The exam was otherwise without abnormality.      The cardia and gastric fundus were normal on retroflexion. Impression:               - Tortuous esophagus.                           - The examination was otherwise normal except for                            slight scope trauma in proximal stomach. Upper GI                            bleed excluded                           - No specimens collected. Moderate Sedation:      Moderate (conscious) sedation was administered by the nurse and       supervised by the endoscopist. The following parameters were monitored:       oxygen  saturation, heart rate, blood pressure, respiratory rate, EKG,       adequacy of pulmonary ventilation, and response to care. Total physician       intraservice time was 10 minutes. Recommendation:           - See the other procedure note for documentation of                            additional recommendations. sigmoidoscopy next Procedure Code(s):        --- Professional ---                           [phone], Esophagogastroduodenoscopy, flexible,                            transoral; diagnostic, including collection of                            specimen(s) by brushing or washing, when performed                            (separate procedure)                           G0500, Moderate sedation services provided by the                            same physician or other qualified health  care                            professional performing a gastrointestinal                            endoscopic service that sedation supports,                            requiring the presence of an independent trained                            observer to assist in the monitoring of the                            patient's level of consciousness and physiological                            status; initial 15 minutes of intra-service time;                            patient age 96 years or older (additional time may                            be reported with [phone], as appropriate) Diagnosis Code(s):        --- Professional ---                           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 All rights reserved. The codes documented in this report are preliminary and upon coder review may  be revised to meet current compliance requirements. Gatha Mayer, MD 09/19/2022 5:20:50 PM This report has been signed electronically. Number of Addenda: 0

## 2022-09-19 NOTE — Assessment & Plan Note (Signed)
Persistent bleeding per rectum with initial hemoglobin 10.6 down to 7.1 on repeat.  On Eliquis as an outpatient.  CT angio GI bleed study shows sigmoid diverticulosis without evidence of active hemorrhage. -[facility] to see in consultation -Transfusing 2 unit PRBCs -Repeat CBC in a.m. with serial H&H -Continue IV PPI infusion for now -Keep n.p.o. -Admit to stepdown

## 2022-09-19 NOTE — Progress Notes (Signed)
PROGRESS NOTE  Kaitlin [name] JHE:[id number] DOB: [birth year]   PCP: [name], MD  Patient is from: Home.  DOA: 09/18/2022 LOS: 0  Chief complaints Chief Complaint  Patient presents with   Rectal Bleeding     Brief Narrative / Interim history: 88 year old F with PMH of persistent A-fib on Eliquis, CVA, LBBB, anxiety, esophageal stricture s/p dilation and diverticulosis presenting with acute rectal bleed that started the day of presentation, and admitted for acute blood loss anemia due to rectal bleed.  Hgb 10.6 but dropped further to 7.1.  Received Kcentra in ED.  She was transfused 3 units.  CT angio without extravasation but diverticulosis.  [facility] consulted.   Subjective: Seen and examined earlier this morning.  Continues to endorse rectal bleeding with dark red stool.  Denies chest pain, shortness of breath or dizziness.  Denies abdominal pain.  In A-fib with my RVR.  Patient's daughter and 2 sons at bedside.  Objective: Vitals:   09/19/22 0700 09/19/22 0802 09/19/22 0850 09/19/22 0900  BP: 128/84 119/72 97/84 (!) 112/59  Pulse: 93 89 (!) 49 (!) 25  Resp: 17 17 (!) 23 18  Temp:  97.7 F (36.5 C)    TempSrc:  Oral    SpO2: 100% 100% 100% 96%  Weight:      Height:        Examination:  GENERAL: Appears frail.  No apparent distress. HEENT: MMM.  Vision and hearing grossly intact.  NECK: Supple.  No apparent JVD.  RESP:  No IWOB.  Fair aeration bilaterally. CVS: Irregular rhythm.  Rate ranges from 100-110's.  Heart sounds normal.  ABD/GI/GU: BS+. Abd soft, NTND.  MSK/EXT:  Moves extremities. No apparent deformity. No edema.  SKIN: no apparent skin lesion or wound NEURO: Awake, alert and oriented appropriately.  No apparent focal neuro deficit. PSYCH: Calm. Normal affect.   Procedures:  None  Microbiology summarized: MRSA PCR screen nonreactive.  Assessment and plan: Principal Problem:   Acute GI bleeding Active Problems:   Persistent atrial  fibrillation (HCC)   ANXIETY DEPRESSION   Multiple lung nodules on CT  Acute blood loss anemia due to rectal bleed: Suspect diverticular bleed given history of diverticulosis.  CT did not show extravasation.  Received Kcentra in ED.  Transfused 3 units. Recent Labs    09/18/22 2113 09/18/22 2235 09/19/22 0805  HGB 10.6* 7.1* 11.7*  -Continue holding Eliquis -Monitor H&H -Continue PPI -NPO.  D5 LR at 75 cc an hour. -Transfused for Hgb less than 7.0. -GI following.  Persistent A-fib with mild RVR: Not on rate or rhythm control.  On low-dose Eliquis POA. -Continue holding Eliquis -IV metoprolol 2.5 mg as needed for sustained HR > 120.   Multiple lung nodules on CT:  groundglass nodules in the right lower lobe, largest measuring 8 mm noted on CT imaging.  Seems chronic per his CT report from 2020. -Follow-up CT in 12 months recommended.   ANXIETY DEPRESSION: On low-dose Xanax as needed at home -IV Ativan 0.25 mg every 8 hours as needed  History of CVA: Stable. History of LBBB: Stable History of esophageal stricture s/p dilation: Stable  Underweight/unintentional weight loss: Reports about 9 pounds weight loss in the last 8 months. Body mass index is 14.33 kg/m. -Consult dietitian once she started taking p.o.          DVT prophylaxis:  SCDs Start: 09/19/22 0045  Code Status: Full code Family Communication: Updated patient's daughter and 2 sons at bedside Level  of care: Stepdown Status is: Inpatient Remains inpatient appropriate because: Ongoing rectal bleed   Final disposition: TBD Consultants:  GI  Sch Meds:  Scheduled Meds:  sodium chloride   Intravenous Once   Chlorhexidine Gluconate Cloth  6 each Topical Daily   sodium chloride flush  3 mL Intravenous Q12H   Continuous Infusions:  sodium chloride 125 mL/hr at 09/19/22 1104   pantoprazole 8 mg/hr (09/19/22 0600)   PRN Meds:.acetaminophen **OR** acetaminophen, fentaNYL (SUBLIMAZE) injection, LORazepam,  metoprolol tartrate, ondansetron **OR** ondansetron (ZOFRAN) IV, mouth rinse  Antimicrobials: Anti-infectives (From admission, onward)    None        I have personally reviewed the following labs and images: CBC: Recent Labs  Lab 09/18/22 2113 09/18/22 2235 09/19/22 0805  WBC 6.8  --  6.0  NEUTROABS 5.3  --   --   HGB 10.6* 7.1* 11.7*  HCT 33.8* 21.0* 35.2*  MCV 96.3  --  92.4  PLT 172  --  145*   BMP &GFR Recent Labs  Lab 09/18/22 2227 09/18/22 2235 09/19/22 0805  NA 137 138 138  K 4.1 3.5 3.9  CL 109 106 109  CO2 22  --  25  GLUCOSE 111* 95 110*  BUN '20 20 16  '$ CREATININE 0.38* 0.30* 0.56  CALCIUM 7.6*  --  7.8*   Estimated Creatinine Clearance: 27.1 mL/min (by C-G formula based on SCr of 0.56 mg/dL). Liver & Pancreas: Recent Labs  Lab 09/18/22 2227 09/19/22 0805  AST 24 17  ALT 8 12  ALKPHOS 39 37*  BILITOT 0.7 1.3*  PROT 4.9* 5.0*  ALBUMIN 2.8* 3.0*   No results for input(s): "LIPASE", "AMYLASE" in the last 168 hours. No results for input(s): "AMMONIA" in the last 168 hours. Diabetic: No results for input(s): "HGBA1C" in the last 72 hours. No results for input(s): "GLUCAP" in the last 168 hours. Cardiac Enzymes: No results for input(s): "CKTOTAL", "CKMB", "CKMBINDEX", "TROPONINI" in the last 168 hours. No results for input(s): "PROBNP" in the last 8760 hours. Coagulation Profile: Recent Labs  Lab 09/18/22 2113 09/19/22 0805  INR 1.4* 1.4*   Thyroid Function Tests: No results for input(s): "TSH", "T4TOTAL", "FREET4", "T3FREE", "THYROIDAB" in the last 72 hours. Lipid Profile: No results for input(s): "CHOL", "HDL", "LDLCALC", "TRIG", "CHOLHDL", "LDLDIRECT" in the last 72 hours. Anemia Panel: No results for input(s): "VITAMINB12", "FOLATE", "FERRITIN", "TIBC", "IRON", "RETICCTPCT" in the last 72 hours. Urine analysis:    Component Value Date/Time   COLORURINE STRAW (A) 03/06/2017 2242   APPEARANCEUR CLEAR 03/06/2017 2242   LABSPEC 1.004  (L) 03/06/2017 2242   PHURINE 7.0 03/06/2017 2242   GLUCOSEU NEGATIVE 03/06/2017 2242   HGBUR MODERATE (A) 03/06/2017 2242   BILIRUBINUR NEGATIVE 03/06/2017 2242   [place] 03/06/2017 2242   PROTEINUR NEGATIVE 03/06/2017 2242   UROBILINOGEN 0.2 01/25/2014 1657   NITRITE NEGATIVE 03/06/2017 2242   LEUKOCYTESUR MODERATE (A) 03/06/2017 2242   Sepsis Labs: Invalid input(s): "PROCALCITONIN", "LACTICIDVEN"  Microbiology: Recent Results (from the past 240 hour(s))  MRSA Next Gen by PCR, Nasal     Status: None   Collection Time: 09/19/22  3:45 AM   Specimen: Nasal Mucosa; Nasal Swab  Result Value Ref Range Status   MRSA by PCR Next Gen NOT DETECTED NOT DETECTED Final    Comment: (NOTE) The GeneXpert MRSA Assay (FDA approved for NASAL specimens only), is one component of a comprehensive MRSA colonization surveillance program. It is not intended to diagnose MRSA infection nor to guide or  monitor treatment for MRSA infections. Test performance is not FDA approved in patients less than 21 years old. Performed at [facility], [place] [address], [place], [place] [address]     Radiology Studies: CT ANGIO GI BLEED  Result Date: 09/18/2022 CLINICAL DATA:  Lower GI bleed, rectal bleeding. EXAM: CTA ABDOMEN AND PELVIS WITHOUT AND WITH CONTRAST TECHNIQUE: Multidetector CT imaging of the abdomen and pelvis was performed using the standard protocol during bolus administration of intravenous contrast. Multiplanar reconstructed images and MIPs were obtained and reviewed to evaluate the vascular anatomy. RADIATION DOSE REDUCTION: This exam was performed according to the departmental dose-optimization program which includes automated exposure control, adjustment of the mA and/or kV according to patient size and/or use of iterative reconstruction technique. CONTRAST:  46m OMNIPAQUE IOHEXOL 350 MG/ML SOLN COMPARISON:  08/02/2019. FINDINGS: VASCULAR Aorta: Aortic atherosclerosis.  Normal caliber aorta without aneurysm, dissection, vasculitis or significant stenosis. Celiac: Patent without evidence of aneurysm, dissection, vasculitis or significant stenosis. 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 SMA: Patent without evidence of aneurysm, dissection, vasculitis or significant stenosis. Renals: The right renal artery is within normal limits. An accessory renal artery is noted on the left. The left renal artery and accessory artery are diminutive. There is an area of decreased enhancement of the mid left renal artery IMA: Patent without evidence of aneurysm, dissection, vasculitis or significant stenosis. Inflow: Patent without evidence of aneurysm, dissection, vasculitis or significant stenosis. Proximal Outflow: Bilateral common femoral and visualized portions of the superficial and profunda femoral arteries are patent without evidence of aneurysm, dissection, vasculitis or significant stenosis. Veins: No obvious venous abnormality within the limitations of this arterial phase study. Review of the MIP images confirms the above findings. NON-VASCULAR Lower chest: The heart is enlarged and there is a small pericardial effusion. Coronary artery calcifications are noted. Scattered ground-glass nodular opacities are noted in the right lower lobe, the largest measuring 8 mm, axial image 8. Hepatobiliary: No focal abnormality in the liver. There is intrahepatic and extrahepatic biliary ductal dilatation. The gallbladder is surgically absent. The common bile duct measures 1.2 cm in diameter, not significantly changed from the prior exam. Pancreas: Pancreatic atrophy is noted. There is mild dilatation of the pancreatic duct measuring 5 mm, unchanged from the prior exam. No surrounding inflammatory changes. Spleen: Spleen is enlarged measuring 14.1 cm in length. Adrenals/Urinary Tract: The adrenal glands are within normal limits. A large region of hypoenhancement is in the  mid to upper left kidney which is new from the previous exam. A cyst is present in the right kidney. There is a subcentimeter hypodensity in the left kidney which is too small to further characterize. No renal calculus or hydronephrosis. The bladder is unremarkable. Stomach/Bowel: Stomach is within normal limits. Appendix is not seen. Evaluation of the bowel is limited due to paucity of intra-abdominal fat. No evidence of bowel wall thickening, distention, or inflammatory changes. No free air or pneumatosis. Scattered diverticula are present along the sigmoid colon without evidence of diverticulitis. No acute hemorrhage is seen. Lymphatic: No abdominal or pelvic lymphadenopathy. Reproductive: Status post hysterectomy. No adnexal masses. Other: No abdominopelvic ascites. Musculoskeletal: Degenerative changes in the thoracolumbar spine. No acute osseous abnormality. IMPRESSION: VASCULAR 1. No evidence of active hemorrhage. 2. Accessory left renal artery. The renal arteries on the left are diminutive with severe stenosis of the mid left renal artery. 3. Aortic atherosclerosis. NON-VASCULAR 1. Sigmoid diverticulosis without diverticulitis. No active hemorrhage is identified. 2.  Hypoenhancement involving the mid to upper left kidney with severe stenosis of the mid left renal artery, possible infarct versus infection. 3. Ground-glass nodules in the right lower lobe, the largest measuring 8 mm. Adenocarcinoma can not be excluded. Follow-up CT is recommended in 12 months, with continued annual surveillance for minimum of 3 years. These recommendations are taken from colon recommendations for the management of subsolid pulmonary nodules detected at CT: A statement from the [place]. Radiology 2013; 266:1, 989-211. 4. Status post cholecystectomy with stable dilatation of the biliary ducts and pancreatic duct. Electronically Signed   By: Brett Fairy M.D.   On: 09/18/2022 23:44      [employer] Ker[employer] T. McDowell  If 7PM-7AM, please contact night-coverage [email or web address] 09/19/2022, 11:28 AM
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
## 2022-09-19 NOTE — Hospital Course (Addendum)
88 year old F with PMH of persistent A-fib on Eliquis, CVA, LBBB, anxiety, esophageal stricture s/p dilation and diverticulosis presenting with acute rectal bleed that started the day of presentation, and admitted for acute blood loss anemia due to rectal bleed.  Hgb 10.6 but dropped further to 7.1.  Received Kcentra in ED.  She was transfused 3 units.  CT angio without extravasation but diverticulosis.  [facility] consulted.   EGD showed tortuous esophagus otherwise normal. Flexible sig blood in the rectum and in the sigmoid colon, severe diverticulosis in sigmoid colon and narrowing of colon in association with the diverticular opening.  Bleeding seems to have subsided.  Transfused additional 1 unit on 12/10.  H&H stable.  Tolerating soft diet.  GI signed off.   Patient has urinary tract infection.  Urine culture with pansensitive Klebsiella oxytoca and E. Coli.  She received IV ceftriaxone for 4 days followed by p.o. cefadroxil for 1 day.  However, she had acute on chronic back pain, right flank pain and mild temp. lumbar x-ray showed moderate to severe multilevel DDD.  CT chest/abdomen/pelvis with contrast showed possible right nephric abscesses versus infarction and slightly enlarged RUE nodule measuring 16 x 9 mm (from 7 x 8 mm in 2020).  Antibiotics broadened to IV Zosyn.  Infectious disease consulted.  Antibiotics de-escalated to IV ceftriaxone.

## 2022-09-19 NOTE — TOC CM/SW Note (Signed)
Transition of Care [facility]) Screening Note  Patient Details  Name: Kaitlin [name] Date of Birth: [birth year]  Transition of [facility]) CM/SW Contact:    Sherie Don, LCSW Phone Number: 09/19/2022, 11:17 AM  Transition of Care Department [facility]) has reviewed patient and no TOC needs have been identified at this time. We will continue to monitor patient advancement through interdisciplinary progression rounds. If new patient transition needs arise, please place a TOC consult.

## 2022-09-19 NOTE — Assessment & Plan Note (Signed)
Groundglass nodules in the right lower lobe, largest measuring 8 mm noted on CT imaging.  Follow-up CT in 12 months recommended.

## 2022-09-19 NOTE — Assessment & Plan Note (Signed)
Remains in atrial fibrillation, rate controlled.  Holding Eliquis due to active GI bleeding.  Not on rate/rhythm controlling medications as an outpatient.

## 2022-09-19 NOTE — Progress Notes (Signed)
I did this exam pre-procedure

## 2022-09-20 DIAGNOSIS — R918 Other nonspecific abnormal finding of lung field: Secondary | ICD-10-CM

## 2022-09-20 DIAGNOSIS — K5731 Diverticulosis of large intestine without perforation or abscess with bleeding: Secondary | ICD-10-CM | POA: Diagnosis not present

## 2022-09-20 DIAGNOSIS — K922 Gastrointestinal hemorrhage, unspecified: Secondary | ICD-10-CM | POA: Diagnosis not present

## 2022-09-20 DIAGNOSIS — I4819 Other persistent atrial fibrillation: Secondary | ICD-10-CM | POA: Diagnosis not present

## 2022-09-20 DIAGNOSIS — D62 Acute posthemorrhagic anemia: Secondary | ICD-10-CM | POA: Diagnosis not present

## 2022-09-20 DIAGNOSIS — R338 Other retention of urine: Secondary | ICD-10-CM

## 2022-09-20 DIAGNOSIS — I495 Sick sinus syndrome: Secondary | ICD-10-CM

## 2022-09-20 LAB — CBC
HCT: 24.9 % — ABNORMAL LOW (ref 36.0–46.0)
Hemoglobin: 8 g/dL — ABNORMAL LOW (ref 12.0–15.0)
MCH: 31 pg (ref 26.0–34.0)
MCHC: 32.1 g/dL (ref 30.0–36.0)
MCV: 96.5 fL (ref 80.0–100.0)
Platelets: 119 10*3/uL — ABNORMAL LOW (ref 150–400)
RBC: 2.58 MIL/uL — ABNORMAL LOW (ref 3.87–5.11)
RDW: 14.6 % (ref 11.5–15.5)
WBC: 7 10*3/uL (ref 4.0–10.5)
nRBC: 0 % (ref 0.0–0.2)

## 2022-09-20 LAB — RENAL FUNCTION PANEL
Albumin: 2.2 g/dL — ABNORMAL LOW (ref 3.5–5.0)
Anion gap: 3 — ABNORMAL LOW (ref 5–15)
BUN: 13 mg/dL (ref 8–23)
CO2: 25 mmol/L (ref 22–32)
Calcium: 7.5 mg/dL — ABNORMAL LOW (ref 8.9–10.3)
Chloride: 109 mmol/L (ref 98–111)
Creatinine, Ser: 0.6 mg/dL (ref 0.44–1.00)
GFR, Estimated: >60 mL/min (ref >60)
Glucose, Bld: 89 mg/dL (ref 70–99)
Phosphorus: 3.4 mg/dL (ref 2.5–4.6)
Potassium: 3.6 mmol/L (ref 3.5–5.1)
Sodium: 137 mmol/L (ref 135–145)

## 2022-09-20 LAB — MAGNESIUM: Magnesium: 1.8 mg/dL (ref 1.7–2.4)

## 2022-09-20 MED ORDER — FENTANYL CITRATE PF 50 MCG/ML IJ SOSY
25.0000 ug | PREFILLED_SYRINGE | INTRAMUSCULAR | Status: DC | PRN
  Administered 2022-09-20 – 2022-09-24 (×7): 25 ug via INTRAVENOUS
  Filled 2022-09-20 (×7): qty 1

## 2022-09-20 MED ORDER — LIDOCAINE 5 % EX PTCH
1.0000 | MEDICATED_PATCH | CUTANEOUS | Status: DC
  Administered 2022-09-20 – 2022-09-30 (×10): 1 via TRANSDERMAL
  Filled 2022-09-20 (×11): qty 1

## 2022-09-20 MED ORDER — HYDROCODONE-ACETAMINOPHEN 5-325 MG PO TABS
1.0000 | ORAL_TABLET | Freq: Four times a day (QID) | ORAL | Status: DC | PRN
  Administered 2022-09-20 – 2022-09-24 (×16): 1 via ORAL
  Filled 2022-09-20 (×16): qty 1

## 2022-09-20 NOTE — Care Plan (Signed)
Mrs. Perrault presents with an acute bleed in her rectum. She has a history of atrial fibrillation, CHADS2VASC=7, with 9.7% yearly stroke risk ( lacunar infarcts 2018). (Age, CHF, stroke, gender).  She has an active bleed with hgb drop to 7. She is a bleeding risk at this time and it is prudent to weight the risks and benefits of anticoagulation. Her stroke risk is high, however this is a yearly risk. Further her prior stroke was remote and lacunar, unlikely embolic. Considering this, would hold her eliquis until she can be evaluated as an outpatient. She has an appointment with Almyra Deforest [phone] to discuss this. Please relay this to her family and let us know if further assistance is needed tomorrow 12/9.

## 2022-09-20 NOTE — Progress Notes (Addendum)
Daily Progress Note  Hospital Day: 3  Chief Complaint: Gl bleeding  Assessment   Patient profile:  Kaitlin [name] is a 88 y.o. female with a pmh of GERD with esophageal stricture, non-H.pylori related chronic gastritis, diverticulosis, CVA, Afib on Eliquis. Admitted with gi bleeding.   # Painless hematochezia / acute blood loss anemia on Eliquis.  Suspect sigmoid diverticular bleed based on flexible sigmoidoscopy findings yesterday. EGD ruled out upper bleed.  Received KCentra Received 3 u PRBC. Hgb currently 8, down from 8.7 overnight She had a couple of brown BMs this am with some blood Sounds like volume of blood is much less compared to yesterday. Hopefully the blood was old.   She is starting clear liquids now.  Family /patient requesting Cardiology evaluate her inpatient. They want to know if Elquis shoul be reduced or even discontinued given the GI bleed.   # Stable intra / extra hepatic duct dilation on CT   # Pancreatic atrophy and mild dilation of PD ( unchanged) on CTA   # Unintentional weight loss. She is down ~ 9 pounds since March 2023.    # Ground-glass nodules in the right lower lobe,adenocarcinoma can not be excluded   Plan:    I will let [facility] know about family's request for Cardiology evaluation  Continue to monitor for bleeding.   ----------------------------------------------------------------------------------------------------------  GI attending:  Patient seen and evaluated as well.  Bleeding is resolving.  Patient and family very concerned about restarting Eliquis.  Cardiology has reviewed the chart and indicated to hold it until outpatient follow-up in February.  We will follow-up tomorrow.  I suspect she can go to the floor soon unless things deteriorate.   Gatha Mayer, MD, [facility] See Shea Evans on call - gastroenterology for best contact person 09/20/2022 5:59 PM  Subjective   Feels okay. Had two bms this am,  both contained some blood but much less than yesterday.   Objective   Endoscopic studies:  EGD Tortuous esophagus, EGD otherwise normal.   Flexible sigmoidoscopy  -Blood in the rectum and in the sigmoid colon. There was a transition from all blood to dark stool mixed w/ blood in proximal sigmoid but not any clear change from bloody to no blood. - Severe diverticulosis in the sigmoid colon. There was narrowing of the colon in association with the diverticular opening. - No specimens collected.   Imaging:  CT ANGIO GI BLEED  Result Date: 09/18/2022 CLINICAL DATA:  Lower GI bleed, rectal bleeding. EXAM: CTA ABDOMEN AND PELVIS WITHOUT AND WITH CONTRAST TECHNIQUE: Multidetector CT imaging of the abdomen and pelvis was performed using the standard protocol during bolus administration of intravenous contrast. Multiplanar reconstructed images and MIPs were obtained and reviewed to evaluate the vascular anatomy. RADIATION DOSE REDUCTION: This exam was performed according to the departmental dose-optimization program which includes automated exposure control, adjustment of the mA and/or kV according to patient size and/or use of iterative reconstruction technique. CONTRAST:  20m OMNIPAQUE IOHEXOL 350 MG/ML SOLN COMPARISON:  08/02/2019. FINDINGS: VASCULAR Aorta: Aortic atherosclerosis. Normal caliber aorta without aneurysm, dissection, vasculitis or significant stenosis. Celiac: Patent without evidence of aneurysm, dissection, vasculitis or significant stenosis. 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 SMA: Patent without evidence of aneurysm, dissection, vasculitis or significant stenosis. Renals: The right renal artery is within normal limits. An accessory renal artery is noted on the left. The left renal artery and accessory artery are diminutive. There is an  area of decreased enhancement of the mid left renal artery IMA: Patent without evidence of aneurysm, dissection,  vasculitis or significant stenosis. Inflow: Patent without evidence of aneurysm, dissection, vasculitis or significant stenosis. Proximal Outflow: Bilateral common femoral and visualized portions of the superficial and profunda femoral arteries are patent without evidence of aneurysm, dissection, vasculitis or significant stenosis. Veins: No obvious venous abnormality within the limitations of this arterial phase study. Review of the MIP images confirms the above findings. NON-VASCULAR Lower chest: The heart is enlarged and there is a small pericardial effusion. Coronary artery calcifications are noted. Scattered ground-glass nodular opacities are noted in the right lower lobe, the largest measuring 8 mm, axial image 8. Hepatobiliary: No focal abnormality in the liver. There is intrahepatic and extrahepatic biliary ductal dilatation. The gallbladder is surgically absent. The common bile duct measures 1.2 cm in diameter, not significantly changed from the prior exam. Pancreas: Pancreatic atrophy is noted. There is mild dilatation of the pancreatic duct measuring 5 mm, unchanged from the prior exam. No surrounding inflammatory changes. Spleen: Spleen is enlarged measuring 14.1 cm in length. Adrenals/Urinary Tract: The adrenal glands are within normal limits. A large region of hypoenhancement is in the mid to upper left kidney which is new from the previous exam. A cyst is present in the right kidney. There is a subcentimeter hypodensity in the left kidney which is too small to further characterize. No renal calculus or hydronephrosis. The bladder is unremarkable. Stomach/Bowel: Stomach is within normal limits. Appendix is not seen. Evaluation of the bowel is limited due to paucity of intra-abdominal fat. No evidence of bowel wall thickening, distention, or inflammatory changes. No free air or pneumatosis. Scattered diverticula are present along the sigmoid colon without evidence of diverticulitis. No acute hemorrhage  is seen. Lymphatic: No abdominal or pelvic lymphadenopathy. Reproductive: Status post hysterectomy. No adnexal masses. Other: No abdominopelvic ascites. Musculoskeletal: Degenerative changes in the thoracolumbar spine. No acute osseous abnormality. IMPRESSION: VASCULAR 1. No evidence of active hemorrhage. 2. Accessory left renal artery. The renal arteries on the left are diminutive with severe stenosis of the mid left renal artery. 3. Aortic atherosclerosis. NON-VASCULAR 1. Sigmoid diverticulosis without diverticulitis. No active hemorrhage is identified. 2. Hypoenhancement involving the mid to upper left kidney with severe stenosis of the mid left renal artery, possible infarct versus infection. 3. Ground-glass nodules in the right lower lobe, the largest measuring 8 mm. Adenocarcinoma can not be excluded. Follow-up CT is recommended in 12 months, with continued annual surveillance for minimum of 3 years. These recommendations are taken from colon recommendations for the management of subsolid pulmonary nodules detected at CT: A statement from the [place]. Radiology 2013; 266:1, [id number]. 4. Status post cholecystectomy with stable dilatation of the biliary ducts and pancreatic duct. Electronically Signed   By: Brett Fairy M.D.   On: 09/18/2022 23:44    Lab Results: Recent Labs    09/18/22 2113 09/18/22 2235 09/19/22 0805 09/19/22 1210 09/19/22 1715 09/20/22 0302  WBC 6.8  --  6.0  --   --  7.0  HGB 10.6*   < > 11.7* 10.1* 8.7* 8.0*  HCT 33.8*   < > 35.2* 30.8* 26.4* 24.9*  PLT 172  --  145*  --   --  119*   < > = values in this interval not displayed.   BMET Recent Labs    09/18/22 2227 09/18/22 2235 09/19/22 0805 09/20/22 0302  NA 137 138 138 137  K 4.1 3.5 3.9 3.6  CL 109 106 109 109  CO2 22  --  25 25  GLUCOSE 111* 95 110* 89  BUN '20 20 16 13  '$ CREATININE 0.38* 0.30* 0.56 0.60  CALCIUM 7.6*  --  7.8* 7.5*   LFT Recent Labs    09/19/22 0805 09/20/22 0302  PROT 5.0*   --   ALBUMIN 3.0* 2.2*  AST 17  --   ALT 12  --   ALKPHOS 37*  --   BILITOT 1.3*  --    PT/INR Recent Labs    09/18/22 2113 09/19/22 0805  LABPROT 16.6* 16.5*  INR 1.4* 1.4*     Scheduled inpatient medications:   Chlorhexidine Gluconate Cloth  6 each Topical Daily   lidocaine  1 patch Transdermal Q24H   sodium chloride flush  3 mL Intravenous Q12H   Continuous inpatient infusions:   sodium chloride 20 mL/hr at 09/20/22 0700   PRN inpatient medications: acetaminophen **OR** acetaminophen, fentaNYL (SUBLIMAZE) injection, HYDROcodone-acetaminophen, LORazepam, metoprolol tartrate, ondansetron **OR** ondansetron (ZOFRAN) IV, mouth rinse  Vital signs in last 24 hours: Temp:  [97.1 F (36.2 C)-98.2 F (36.8 C)] 98.2 F (36.8 C) (12/08 0834) Pulse Rate:  [63-162] 88 (12/08 1000) Resp:  [15-42] 18 (12/08 1000) BP: (65-145)/(20-126) 141/52 (12/08 1000) SpO2:  [97 %-100 %] 100 % (12/08 1000) Last BM Date : 09/20/22  Intake/Output Summary (Last 24 hours) at 09/20/2022 1107 Last data filed at 09/20/2022 1032 Gross per 24 hour  Intake 1370.57 ml  Output 650 ml  Net 720.57 ml    Intake/Output from previous day: 12/07 0701 - 12/08 0700 In: 1723.1 [I.V.:1370.6; Blood:352.5] Out: 900 [Urine:900] Intake/Output this shift: Total I/O In: -  Out: 100 [Urine:100]   Physical Exam:  General: Alert female in NAD Heart:  Regular rate   Pulmonary: Normal respiratory effort Abdomen: Soft, nondistended, nontender. Normal bowel sounds. Neurologic: Alert and oriented Psych: Pleasant. Cooperative.    Principal Problem:   Acute GI bleeding Active Problems:   ANXIETY DEPRESSION   Tachy-brady syndrome (HCC)   Persistent atrial fibrillation (HCC)   History of stroke   Multiple lung nodules on CT   Acute blood loss anemia   Underweight     LOS: 1 day   Tye Savoy ,NP 09/20/2022, 11:07 AM

## 2022-09-20 NOTE — Progress Notes (Signed)
PROGRESS NOTE  Kaitlin [name] BOF:[id number] DOB: Feb 16, 1927   PCP: [name], MD  Patient is from: Home.  DOA: 09/18/2022 LOS: 1  Chief complaints Chief Complaint  Patient presents with   Rectal Bleeding     Brief Narrative / Interim history: 88 year old F with PMH of persistent A-fib on Eliquis, CVA, LBBB, anxiety, esophageal stricture s/p dilation and diverticulosis presenting with acute rectal bleed that started the day of presentation, and admitted for acute blood loss anemia due to rectal bleed.  Hgb 10.6 but dropped further to 7.1.  Received Kcentra in ED.  She was transfused 3 units.  CT angio without extravasation but diverticulosis.  [facility] consulted.  EGD showed tortuous esophagus otherwise normal. Flexible sig blood in the rectum and in the sigmoid colon, severe diverticulosis in sigmoid colon and narrowing of colon in association with the diverticular opening.  Bleeding seems to have subsided.  Subjective: Seen and examined earlier this morning.  No major events overnight of this morning.  No further GI bleed overnight.  She had 2 bowel movement this morning.  The first 1 contained some blood but the second was less bloody.  Feels thirsty.  Likes to know if she can drink or eat.  Daughter and son at bedside.  Objective: Vitals:   09/20/22 0600 09/20/22 0800 09/20/22 0834 09/20/22 1000  BP: (!) 107/46 123/83  (!) 141/52  Pulse: 74 87  88  Resp: '18 20  18  '$ Temp:   98.2 F (36.8 C)   TempSrc:   Oral   SpO2: 100% 100%  100%  Weight:      Height:        Examination:  GENERAL: Appears frail.  Nontoxic. HEENT: MMM.  Vision and hearing grossly intact.  NECK: Supple.  No apparent JVD.  RESP:  No IWOB.  Fair aeration bilaterally. CVS: Irregular rhythm.  Normal rate.  Heart sounds normal.  ABD/GI/GU: BS+. Abd soft, NTND.  MSK/EXT:  Moves extremities.  Significant muscle mass and subcu fat loss. SKIN: no apparent skin lesion or wound NEURO: Awake and  alert. Oriented appropriately.  No apparent focal neuro deficit. PSYCH: Calm. Normal affect.   Procedures:  None  Microbiology summarized: MRSA PCR screen nonreactive.  Assessment and plan: Principal Problem:   Acute GI bleeding Active Problems:   Persistent atrial fibrillation (HCC)   ANXIETY DEPRESSION   Tachy-brady syndrome (HCC)   History of stroke   Multiple lung nodules on CT   Acute blood loss anemia   Underweight  Acute blood loss anemia due to rectal bleed: Suspect diverticular bleed given history of diverticulosis.  CT did not show extravasation.  Received Kcentra in ED.  Transfused 3 units.  EGD negative except for torturous esophagus.  Flex sig with blood in rectum and sigmoid colon and  severe sigmoid diverticulosis.  Bleeding seems to have subsided. Recent Labs    09/18/22 2113 09/18/22 2235 09/19/22 0805 09/19/22 1210 09/19/22 1715 09/20/22 0302  HGB 10.6* 7.1* 11.7* 10.1* 8.7* 8.0*  -Continue holding Eliquis.  -Monitor H&H -Started on clear liquid diet -Transfused for Hgb less than 7.0. -GI following.  Persistent A-fib with mild RVR: Not on rate or rhythm control.  On low-dose Eliquis POA. -Continue holding Eliquis -IV metoprolol 2.5 mg as needed for sustained HR > 120. -Cardiology consult requested   Multiple lung nodules on CT:  groundglass nodules in the right lower lobe, largest measuring 8 mm noted on CT imaging.  Seems chronic per his CT  report from 2020. -Follow-up CT in 12 months recommended.   ANXIETY DEPRESSION: On low-dose Xanax as needed at home -IV Ativan 0.25 mg every 8 hours as needed  Acute urinary retention: Had about 730 cc urine output after Foley insertion. -Voiding trial and intermittent bladder scan  History of CVA: Stable. History of LBBB: Stable History of esophageal stricture s/p dilation: Stable  Underweight/unintentional weight loss: Reports about 9 pounds weight loss in the last 8 months. Body mass index is 14.33  kg/m. -Consult dietitian once she started taking p.o.          DVT prophylaxis:  SCDs Start: 09/19/22 0045  Code Status: Full code Family Communication: Updated patient's daughter and 2 sons at bedside Level of care: Stepdown Status is: Inpatient Remains inpatient appropriate because: Ongoing rectal bleed   Final disposition: TBD Consultants:  GI Cardiology  Sch Meds:  Scheduled Meds:  Chlorhexidine Gluconate Cloth  6 each Topical Daily   lidocaine  1 patch Transdermal Q24H   sodium chloride flush  3 mL Intravenous Q12H   Continuous Infusions:  sodium chloride 20 mL/hr at 09/20/22 0700   PRN Meds:.acetaminophen **OR** acetaminophen, fentaNYL (SUBLIMAZE) injection, HYDROcodone-acetaminophen, LORazepam, metoprolol tartrate, ondansetron **OR** ondansetron (ZOFRAN) IV, mouth rinse  Antimicrobials: Anti-infectives (From admission, onward)    None        I have personally reviewed the following labs and images: CBC: Recent Labs  Lab 09/18/22 2113 09/18/22 2235 09/19/22 0805 09/19/22 1210 09/19/22 1715 09/20/22 0302  WBC 6.8  --  6.0  --   --  7.0  NEUTROABS 5.3  --   --   --   --   --   HGB 10.6* 7.1* 11.7* 10.1* 8.7* 8.0*  HCT 33.8* 21.0* 35.2* 30.8* 26.4* 24.9*  MCV 96.3  --  92.4  --   --  96.5  PLT 172  --  145*  --   --  119*   BMP &GFR Recent Labs  Lab 09/18/22 2227 09/18/22 2235 09/19/22 0805 09/20/22 0302  NA 137 138 138 137  K 4.1 3.5 3.9 3.6  CL 109 106 109 109  CO2 22  --  25 25  GLUCOSE 111* 95 110* 89  BUN '20 20 16 13  '$ CREATININE 0.38* 0.30* 0.56 0.60  CALCIUM 7.6*  --  7.8* 7.5*  MG  --   --   --  1.8  PHOS  --   --   --  3.4   Estimated Creatinine Clearance: 27.1 mL/min (by C-G formula based on SCr of 0.6 mg/dL). Liver & Pancreas: Recent Labs  Lab 09/18/22 2227 09/19/22 0805 09/20/22 0302  AST 24 17  --   ALT 8 12  --   ALKPHOS 39 37*  --   BILITOT 0.7 1.3*  --   PROT 4.9* 5.0*  --   ALBUMIN 2.8* 3.0* 2.2*   No  results for input(s): "LIPASE", "AMYLASE" in the last 168 hours. No results for input(s): "AMMONIA" in the last 168 hours. Diabetic: No results for input(s): "HGBA1C" in the last 72 hours. No results for input(s): "GLUCAP" in the last 168 hours. Cardiac Enzymes: No results for input(s): "CKTOTAL", "CKMB", "CKMBINDEX", "TROPONINI" in the last 168 hours. No results for input(s): "PROBNP" in the last 8760 hours. Coagulation Profile: Recent Labs  Lab 09/18/22 2113 09/19/22 0805  INR 1.4* 1.4*   Thyroid Function Tests: No results for input(s): "TSH", "T4TOTAL", "FREET4", "T3FREE", "THYROIDAB" in the last 72 hours. Lipid Profile: No results for  input(s): "CHOL", "HDL", "LDLCALC", "TRIG", "CHOLHDL", "LDLDIRECT" in the last 72 hours. Anemia Panel: No results for input(s): "VITAMINB12", "FOLATE", "FERRITIN", "TIBC", "IRON", "RETICCTPCT" in the last 72 hours. Urine analysis:    Component Value Date/Time   COLORURINE STRAW (A) 03/06/2017 2242   APPEARANCEUR CLEAR 03/06/2017 2242   LABSPEC 1.004 (L) 03/06/2017 2242   PHURINE 7.0 03/06/2017 2242   GLUCOSEU NEGATIVE 03/06/2017 2242   HGBUR MODERATE (A) 03/06/2017 2242   BILIRUBINUR NEGATIVE 03/06/2017 2242   [place] 03/06/2017 2242   PROTEINUR NEGATIVE 03/06/2017 2242   UROBILINOGEN 0.2 01/25/2014 1657   NITRITE NEGATIVE 03/06/2017 2242   LEUKOCYTESUR MODERATE (A) 03/06/2017 2242   Sepsis Labs: Invalid input(s): "PROCALCITONIN", "LACTICIDVEN"  Microbiology: Recent Results (from the past 240 hour(s))  MRSA Next Gen by PCR, Nasal     Status: None   Collection Time: 09/19/22  3:45 AM   Specimen: Nasal Mucosa; Nasal Swab  Result Value Ref Range Status   MRSA by PCR Next Gen NOT DETECTED NOT DETECTED Final    Comment: (NOTE) The GeneXpert MRSA Assay (FDA approved for NASAL specimens only), is one component of a comprehensive MRSA colonization surveillance program. It is not intended to diagnose MRSA infection nor to  guide or monitor treatment for MRSA infections. Test performance is not FDA approved in patients less than 36 years old. Performed at [facility], [place] [address], [place], [place] [address]     Radiology Studies: No results found.    Katelind Pytel T. [place]  If 7PM-7AM, please contact night-coverage [email or web address] 09/20/2022, 12:13 PM

## 2022-09-21 ENCOUNTER — Encounter (HOSPITAL_COMMUNITY): Payer: Self-pay | Admitting: Internal Medicine

## 2022-09-21 DIAGNOSIS — K5731 Diverticulosis of large intestine without perforation or abscess with bleeding: Secondary | ICD-10-CM | POA: Diagnosis not present

## 2022-09-21 DIAGNOSIS — K922 Gastrointestinal hemorrhage, unspecified: Secondary | ICD-10-CM | POA: Diagnosis not present

## 2022-09-21 DIAGNOSIS — M545 Low back pain, unspecified: Secondary | ICD-10-CM

## 2022-09-21 DIAGNOSIS — I4819 Other persistent atrial fibrillation: Secondary | ICD-10-CM | POA: Diagnosis not present

## 2022-09-21 DIAGNOSIS — D62 Acute posthemorrhagic anemia: Secondary | ICD-10-CM | POA: Diagnosis not present

## 2022-09-21 DIAGNOSIS — R300 Dysuria: Secondary | ICD-10-CM

## 2022-09-21 DIAGNOSIS — Z8673 Personal history of transient ischemic attack (TIA), and cerebral infarction without residual deficits: Secondary | ICD-10-CM | POA: Diagnosis not present

## 2022-09-21 DIAGNOSIS — G8929 Other chronic pain: Secondary | ICD-10-CM

## 2022-09-21 DIAGNOSIS — R918 Other nonspecific abnormal finding of lung field: Secondary | ICD-10-CM | POA: Diagnosis not present

## 2022-09-21 LAB — RENAL FUNCTION PANEL
Albumin: 2.5 g/dL — ABNORMAL LOW (ref 3.5–5.0)
Anion gap: 5 (ref 5–15)
BUN: 10 mg/dL (ref 8–23)
CO2: 25 mmol/L (ref 22–32)
Calcium: 7.8 mg/dL — ABNORMAL LOW (ref 8.9–10.3)
Chloride: 108 mmol/L (ref 98–111)
Creatinine, Ser: 0.59 mg/dL (ref 0.44–1.00)
GFR, Estimated: >60 mL/min (ref >60)
Glucose, Bld: 82 mg/dL (ref 70–99)
Phosphorus: 2.7 mg/dL (ref 2.5–4.6)
Potassium: 3.9 mmol/L (ref 3.5–5.1)
Sodium: 138 mmol/L (ref 135–145)

## 2022-09-21 LAB — URINALYSIS, ROUTINE W REFLEX MICROSCOPIC
Bilirubin Urine: NEGATIVE
Glucose, UA: NEGATIVE mg/dL
Ketones, ur: NEGATIVE mg/dL
Nitrite: POSITIVE — AB
Protein, ur: NEGATIVE mg/dL
Specific Gravity, Urine: 1.012 (ref 1.005–1.030)
WBC, UA: >50 WBC/hpf — ABNORMAL HIGH (ref 0–5)
pH: 5 (ref 5.0–8.0)

## 2022-09-21 LAB — CBC
HCT: 24.6 % — ABNORMAL LOW (ref 36.0–46.0)
Hemoglobin: 7.8 g/dL — ABNORMAL LOW (ref 12.0–15.0)
MCH: 30.7 pg (ref 26.0–34.0)
MCHC: 31.7 g/dL (ref 30.0–36.0)
MCV: 96.9 fL (ref 80.0–100.0)
Platelets: 126 10*3/uL — ABNORMAL LOW (ref 150–400)
RBC: 2.54 MIL/uL — ABNORMAL LOW (ref 3.87–5.11)
RDW: 14.4 % (ref 11.5–15.5)
WBC: 7.7 10*3/uL (ref 4.0–10.5)
nRBC: 0 % (ref 0.0–0.2)

## 2022-09-21 LAB — MAGNESIUM: Magnesium: 1.6 mg/dL — ABNORMAL LOW (ref 1.7–2.4)

## 2022-09-21 MED ORDER — MAGNESIUM SULFATE 2 GM/50ML IV SOLN
2.0000 g | Freq: Once | INTRAVENOUS | Status: AC
  Administered 2022-09-21: 2 g via INTRAVENOUS
  Filled 2022-09-21: qty 50

## 2022-09-21 MED ORDER — ALPRAZOLAM 0.25 MG PO TABS
0.2500 mg | ORAL_TABLET | Freq: Three times a day (TID) | ORAL | Status: DC | PRN
  Administered 2022-09-21 – 2022-09-30 (×13): 0.25 mg via ORAL
  Filled 2022-09-21 (×14): qty 1

## 2022-09-21 MED ORDER — SODIUM CHLORIDE 0.9 % IV SOLN
1.0000 g | Freq: Every day | INTRAVENOUS | Status: DC
  Administered 2022-09-21 – 2022-09-24 (×4): 1 g via INTRAVENOUS
  Filled 2022-09-21 (×5): qty 10

## 2022-09-21 NOTE — Progress Notes (Addendum)
[facility] ROUNDING NOTE   Subjective: Patient doing well this morning.  Tolerating clear liquid diet, wanting to eat something more substantial.  She had a bowel movement last night which had dark blood in it.  No bowel movements today.  No additional complaints.   Objective: Vital signs in last 24 hours: Temp:  [98 F (36.7 C)-99.5 F (37.5 C)] 98.9 F (37.2 C) (12/09 0700) Pulse Rate:  [68-284] 76 (12/09 0900) Resp:  [18-36] 25 (12/09 0900) BP: (101-145)/(43-106) 115/86 (12/09 0900) SpO2:  [91 %-100 %] 99 % (12/09 0900) Last BM Date : 09/21/22 General: NAD, frail elderly Caucasian female, anxious Lungs:  CTA b/l, no w/r/r Heart:  RRR, no m/r/g Abdomen:  Soft, NT, ND, +BS    Intake/Output from previous day: 12/08 0701 - 12/09 0700 In: 1373.9 [P.O.:960; I.V.:413.9] Out: 175 [Urine:175] Intake/Output this shift: Total I/O In: 422.8 [I.V.:416.1; IV Piggyback:6.7] Out: -    Lab Results: Recent Labs    09/19/22 0805 09/19/22 1210 09/19/22 1715 09/20/22 0302 09/21/22 0235  WBC 6.0  --   --  7.0 7.7  HGB 11.7*   < > 8.7* 8.0* 7.8*  PLT 145*  --   --  119* 126*  MCV 92.4  --   --  96.5 96.9   < > = values in this interval not displayed.   BMET Recent Labs    09/19/22 0805 09/20/22 0302 09/21/22 0235  NA 138 137 138  K 3.9 3.6 3.9  CL 109 109 108  CO2 '25 25 25  '$ GLUCOSE 110* 89 82  BUN '16 13 10  '$ CREATININE 0.56 0.60 0.59  CALCIUM 7.8* 7.5* 7.8*   LFT Recent Labs    09/18/22 2227 09/19/22 0805 09/20/22 0302 09/21/22 0235  PROT 4.9* 5.0*  --   --   ALBUMIN 2.8* 3.0* 2.2* 2.5*  AST 24 17  --   --   ALT 8 12  --   --   ALKPHOS 39 37*  --   --   BILITOT 0.7 1.3*  --   --    PT/INR Recent Labs    09/18/22 2113 09/19/22 0805  INR 1.4* 1.4*      Imaging/Other results: No results found.    Assessment and Plan:  88 year old female with history of CVA and A-fib on Eliquis admitted with brisk painless GI bleeding, most likely  diverticular in etiology.  She received Kcentra and 3 units PRBCs.  Her hemoglobin has been stable the past 24 hours.  She is passing what is most likely old blood. EGD negative for source of bleeding, flex sig with blood in rectum/sigmoid with extensive diverticulosis, but no active bleeding. Suspect her bleeding has significantly slowed if not stopped.  I am okay with advancing her diet to soft.  No plans for further endoscopic intervention unless patient has recurrent brisk bleeding. GI will sign off at this time.  We will arrange outpatient follow-up.  Please contact us if the patient has signs or symptoms of recurrent brisk bleeding. Cardiology has recommended holding Eliquis until outpatient follow-up in February  GI bleed, suspected diverticular hemorrhage - No further intervention planned - Okay to advance diet to soft - Recheck CBC in a.m. - Please contact GI if patient has evidence of recurrent active bleeding. - Will contact patient next week with outpatient follow-up appointment   Daryel November, MD  09/21/2022, 12:55 PM [facility]

## 2022-09-21 NOTE — Progress Notes (Signed)
PROGRESS NOTE  Kaitlin [name] FKC:[id number] DOB: [birth year]   PCP: [name], MD  Patient is from: Home.  DOA: 09/18/2022 LOS: 2  Chief complaints Chief Complaint  Patient presents with   Rectal Bleeding     Brief Narrative / Interim history: 88 year old F with PMH of persistent A-fib on Eliquis, CVA, LBBB, anxiety, esophageal stricture s/p dilation and diverticulosis presenting with acute rectal bleed that started the day of presentation, and admitted for acute blood loss anemia due to rectal bleed.  Hgb 10.6 but dropped further to 7.1.  Received Kcentra in ED.  She was transfused 3 units.  CT angio without extravasation but diverticulosis.  [facility] consulted.  EGD showed tortuous esophagus otherwise normal. Flexible sig blood in the rectum and in the sigmoid colon, severe diverticulosis in sigmoid colon and narrowing of colon in association with the diverticular opening.  Bleeding seems to have subsided.  Advancing diet.  Subjective: Seen and examined earlier this morning.  No major events overnight of this morning.  Complains of back pain.  He has chronic back pain.  Also reports dysuria and frequent urination.  She just had a Foley removed yesterday.  Last bowel movement yesterday afternoon.  Some dark blood.  Globin seems to be stable.  Daughter and son at bedside.  Objective: Vitals:   09/21/22 0700 09/21/22 0800 09/21/22 0900 09/21/22 1200  BP: (!) 121/106 (!) 145/103 115/86   Pulse: 92 90 76   Resp: (!) 25 (!) 25 (!) 25   Temp: 98.9 F (37.2 C)   98 F (36.7 C)  TempSrc: Axillary   Oral  SpO2: 95% 91% 99%   Weight:      Height:        Examination:  GENERAL: Appears frail.  Nontoxic. HEENT: MMM.  Vision and hearing grossly intact.  NECK: Supple.  No apparent JVD.  RESP:  No IWOB.  Fair aeration bilaterally. CVS: Regular rhythm.  Normal rate.  Heart sounds normal.  ABD/GI/GU: BS+. Abd soft, NTND.  MSK/EXT:  Moves extremities.  Significant muscle mass  and subcu fat loss. SKIN: no apparent skin lesion or wound NEURO: Awake and alert. Oriented appropriately.  No apparent focal neuro deficit. PSYCH: Calm. Normal affect.   Procedures:  None  Microbiology summarized: MRSA PCR screen nonreactive.  Assessment and plan: Principal Problem:   Acute GI bleeding Active Problems:   Persistent atrial fibrillation (HCC)   ANXIETY DEPRESSION   Diverticulosis of colon with hemorrhage   Tachy-brady syndrome (HCC)   History of stroke   Multiple lung nodules on CT   Acute blood loss anemia   Underweight  Acute blood loss anemia due to rectal bleed: Suspect diverticular bleed given history of diverticulosis.  CT did not show extravasation.  Received Kcentra in ED.  Transfused 3 units.  EGD negative except for torturous esophagus.  Flex sig with blood in rectum and sigmoid colon and  severe sigmoid diverticulosis.  Bleeding seems to have subsided. Recent Labs    09/18/22 2113 09/18/22 2235 09/19/22 0805 09/19/22 1210 09/19/22 1715 09/20/22 0302 09/21/22 0235  HGB 10.6* 7.1* 11.7* 10.1* 8.7* 8.0* 7.8*  -Cardiology recommended holding Eliquis until outpatient follow-up in February -Check CBC in the morning -Advance diet as tolerated from full liquid to soft. -Transfused for Hgb less than 7.0. -GI signed off.  Persistent A-fib with mild RVR: Not on rate or rhythm control.  On low-dose Eliquis POA.  CHA2DS2-VASc score 7 but she has significant risk for bleeding.  Cardiology recommended holding Eliquis until outpatient follow-up in February. -IV metoprolol 2.5 mg as needed for sustained HR > 120. -Discussed cardiology recommendation with patient and family at bedside.   Multiple lung nodules on CT:  groundglass nodules in the right lower lobe, largest measuring 8 mm noted on CT imaging.  Seems chronic per his CT report from 2020. -Follow-up CT in 12 months recommended.   ANXIETY DEPRESSION: On low-dose Xanax as needed at home -Home Xanax  0.25 mg every 8 hours as needed  Acute urinary retention: Had about 730 cc urine output after Foley insertion. -Passed voiding trial.  Dysuria/increased frequency of urination: Likely from Foley catheter trauma. -Check urinalysis  Chronic back pain: -Continue home Norco. -I agree with air mattress. -OOB/PT/OT   History of CVA: Stable. History of LBBB: Stable History of esophageal stricture s/p dilation: Stable  Underweight/unintentional weight loss: Reports about 9 pounds weight loss in the last 8 months. Body mass index is 14.33 kg/m. -Consult dietitian once she started taking p.o.          DVT prophylaxis:  SCDs Start: 09/19/22 0045  Code Status: Full code Family Communication: Updated patient's daughter and  son at bedside Level of care: Telemetry Status is: Inpatient Remains inpatient appropriate because: Rectal bleed.   Final disposition: TBD Consultants:  GI Cardiology  Sch Meds:  Scheduled Meds:  Chlorhexidine Gluconate Cloth  6 each Topical Daily   lidocaine  1 patch Transdermal Q24H   sodium chloride flush  3 mL Intravenous Q12H   Continuous Infusions:  sodium chloride Stopped (09/21/22 0851)   PRN Meds:.acetaminophen **OR** acetaminophen, ALPRAZolam, fentaNYL (SUBLIMAZE) injection, HYDROcodone-acetaminophen, metoprolol tartrate, ondansetron **OR** ondansetron (ZOFRAN) IV, mouth rinse  Antimicrobials: Anti-infectives (From admission, onward)    None        I have personally reviewed the following labs and images: CBC: Recent Labs  Lab 09/18/22 2113 09/18/22 2235 09/19/22 0805 09/19/22 1210 09/19/22 1715 09/20/22 0302 09/21/22 0235  WBC 6.8  --  6.0  --   --  7.0 7.7  NEUTROABS 5.3  --   --   --   --   --   --   HGB 10.6*   < > 11.7* 10.1* 8.7* 8.0* 7.8*  HCT 33.8*   < > 35.2* 30.8* 26.4* 24.9* 24.6*  MCV 96.3  --  92.4  --   --  96.5 96.9  PLT 172  --  145*  --   --  119* 126*   < > = values in this interval not displayed.    BMP &GFR Recent Labs  Lab 09/18/22 2227 09/18/22 2235 09/19/22 0805 09/20/22 0302 09/21/22 0235  NA 137 138 138 137 138  K 4.1 3.5 3.9 3.6 3.9  CL 109 106 109 109 108  CO2 22  --  '25 25 25  '$ GLUCOSE 111* 95 110* 89 82  BUN '20 20 16 13 10  '$ CREATININE 0.38* 0.30* 0.56 0.60 0.59  CALCIUM 7.6*  --  7.8* 7.5* 7.8*  MG  --   --   --  1.8 1.6*  PHOS  --   --   --  3.4 2.7   Estimated Creatinine Clearance: 27.1 mL/min (by C-G formula based on SCr of 0.59 mg/dL). Liver & Pancreas: Recent Labs  Lab 09/18/22 2227 09/19/22 0805 09/20/22 0302 09/21/22 0235  AST 24 17  --   --   ALT 8 12  --   --   ALKPHOS 39 37*  --   --  BILITOT 0.7 1.3*  --   --   PROT 4.9* 5.0*  --   --   ALBUMIN 2.8* 3.0* 2.2* 2.5*   No results for input(s): "LIPASE", "AMYLASE" in the last 168 hours. No results for input(s): "AMMONIA" in the last 168 hours. Diabetic: No results for input(s): "HGBA1C" in the last 72 hours. No results for input(s): "GLUCAP" in the last 168 hours. Cardiac Enzymes: No results for input(s): "CKTOTAL", "CKMB", "CKMBINDEX", "TROPONINI" in the last 168 hours. No results for input(s): "PROBNP" in the last 8760 hours. Coagulation Profile: Recent Labs  Lab 09/18/22 2113 09/19/22 0805  INR 1.4* 1.4*   Thyroid Function Tests: No results for input(s): "TSH", "T4TOTAL", "FREET4", "T3FREE", "THYROIDAB" in the last 72 hours. Lipid Profile: No results for input(s): "CHOL", "HDL", "LDLCALC", "TRIG", "CHOLHDL", "LDLDIRECT" in the last 72 hours. Anemia Panel: No results for input(s): "VITAMINB12", "FOLATE", "FERRITIN", "TIBC", "IRON", "RETICCTPCT" in the last 72 hours. Urine analysis:    Component Value Date/Time   COLORURINE YELLOW 09/21/2022 1156   APPEARANCEUR CLOUDY (A) 09/21/2022 1156   LABSPEC 1.012 09/21/2022 1156   PHURINE 5.0 09/21/2022 1156   GLUCOSEU NEGATIVE 09/21/2022 1156   HGBUR MODERATE (A) 09/21/2022 1156   BILIRUBINUR NEGATIVE 09/21/2022 1156   KETONESUR  NEGATIVE 09/21/2022 1156   PROTEINUR NEGATIVE 09/21/2022 1156   UROBILINOGEN 0.2 01/25/2014 1657   NITRITE POSITIVE (A) 09/21/2022 1156   LEUKOCYTESUR LARGE (A) 09/21/2022 1156   Sepsis Labs: Invalid input(s): "PROCALCITONIN", "LACTICIDVEN"  Microbiology: Recent Results (from the past 240 hour(s))  MRSA Next Gen by PCR, Nasal     Status: None   Collection Time: 09/19/22  3:45 AM   Specimen: Nasal Mucosa; Nasal Swab  Result Value Ref Range Status   MRSA by PCR Next Gen NOT DETECTED NOT DETECTED Final    Comment: (NOTE) The GeneXpert MRSA Assay (FDA approved for NASAL specimens only), is one component of a comprehensive MRSA colonization surveillance program. It is not intended to diagnose MRSA infection nor to guide or monitor treatment for MRSA infections. Test performance is not FDA approved in patients less than 2 years old. Performed at [facility], [place] [address], [place], [place] [address]     Radiology Studies: No results found.    Jalayne Ganesh T. [place]  If 7PM-7AM, please contact night-coverage [email or web address] 09/21/2022, 2:39 PM

## 2022-09-22 DIAGNOSIS — K922 Gastrointestinal hemorrhage, unspecified: Secondary | ICD-10-CM | POA: Diagnosis not present

## 2022-09-22 DIAGNOSIS — I4819 Other persistent atrial fibrillation: Secondary | ICD-10-CM | POA: Diagnosis not present

## 2022-09-22 DIAGNOSIS — R918 Other nonspecific abnormal finding of lung field: Secondary | ICD-10-CM | POA: Diagnosis not present

## 2022-09-22 DIAGNOSIS — N3000 Acute cystitis without hematuria: Secondary | ICD-10-CM

## 2022-09-22 DIAGNOSIS — D62 Acute posthemorrhagic anemia: Secondary | ICD-10-CM | POA: Diagnosis not present

## 2022-09-22 DIAGNOSIS — K5731 Diverticulosis of large intestine without perforation or abscess with bleeding: Secondary | ICD-10-CM

## 2022-09-22 LAB — BPAM RBC
Blood Product Expiration Date: 202401022359
Blood Product Expiration Date: 202401022359
Blood Product Expiration Date: 202401022359
Blood Product Expiration Date: 202401092359
Blood Product Expiration Date: 202401122359
ISSUE DATE / TIME: 202312062257
ISSUE DATE / TIME: 202312062257
ISSUE DATE / TIME: 202312070536
Unit Type and Rh: 5100
Unit Type and Rh: 5100
Unit Type and Rh: 5100
Unit Type and Rh: 9500
Unit Type and Rh: 9500

## 2022-09-22 LAB — TYPE AND SCREEN
ABO/RH(D): O POS
Antibody Screen: NEGATIVE
Unit division: 0
Unit division: 0
Unit division: 0
Unit division: 0
Unit division: 0

## 2022-09-22 LAB — RENAL FUNCTION PANEL
Albumin: 2.3 g/dL — ABNORMAL LOW (ref 3.5–5.0)
Anion gap: 4 — ABNORMAL LOW (ref 5–15)
BUN: 8 mg/dL (ref 8–23)
CO2: 28 mmol/L (ref 22–32)
Calcium: 7.8 mg/dL — ABNORMAL LOW (ref 8.9–10.3)
Chloride: 110 mmol/L (ref 98–111)
Creatinine, Ser: 0.52 mg/dL (ref 0.44–1.00)
GFR, Estimated: >60 mL/min (ref >60)
Glucose, Bld: 81 mg/dL (ref 70–99)
Phosphorus: 2.8 mg/dL (ref 2.5–4.6)
Potassium: 3.7 mmol/L (ref 3.5–5.1)
Sodium: 142 mmol/L (ref 135–145)

## 2022-09-22 LAB — RETICULOCYTES
Immature Retic Fract: 26.2 % — ABNORMAL HIGH (ref 2.3–15.9)
RBC.: 2.44 MIL/uL — ABNORMAL LOW (ref 3.87–5.11)
Retic Count, Absolute: 77.3 10*3/uL (ref 19.0–186.0)
Retic Ct Pct: 3.2 % — ABNORMAL HIGH (ref 0.4–3.1)

## 2022-09-22 LAB — CBC
HCT: 24 % — ABNORMAL LOW (ref 36.0–46.0)
Hemoglobin: 7.6 g/dL — ABNORMAL LOW (ref 12.0–15.0)
MCH: 30.8 pg (ref 26.0–34.0)
MCHC: 31.7 g/dL (ref 30.0–36.0)
MCV: 97.2 fL (ref 80.0–100.0)
Platelets: 126 10*3/uL — ABNORMAL LOW (ref 150–400)
RBC: 2.47 MIL/uL — ABNORMAL LOW (ref 3.87–5.11)
RDW: 14.4 % (ref 11.5–15.5)
WBC: 4.7 10*3/uL (ref 4.0–10.5)
nRBC: 0 % (ref 0.0–0.2)

## 2022-09-22 LAB — HEMOGLOBIN AND HEMATOCRIT, BLOOD
HCT: 29.3 % — ABNORMAL LOW (ref 36.0–46.0)
Hemoglobin: 9.7 g/dL — ABNORMAL LOW (ref 12.0–15.0)

## 2022-09-22 LAB — PREPARE RBC (CROSSMATCH)

## 2022-09-22 LAB — MAGNESIUM: Magnesium: 2 mg/dL (ref 1.7–2.4)

## 2022-09-22 MED ORDER — PHENAZOPYRIDINE HCL 100 MG PO TABS: 100.0000 mg | ORAL_TABLET | Freq: Three times a day (TID) | ORAL | Status: DC

## 2022-09-22 MED ORDER — SODIUM CHLORIDE 0.9% IV SOLUTION: Freq: Once | INTRAVENOUS | Status: AC

## 2022-09-22 NOTE — Evaluation (Signed)
Physical Therapy Evaluation Patient Details Name: Kaitlin [name] MRN: [id number] DOB: 04-12-1927 Today's Date: 09/22/2022  History of Present Illness  88 year old female admitted with acute rectal bleed, ABLA; CT angio without extravasation but diverticulosis. GI consulted  PMH: A-fib on Eliquis, CVA, LBBB, anxiety, esophageal stricture s/p dilation and diverticulosis, tachy-brady, IBS  Clinical Impression  Pt admitted with above diagnosis.  Pt independent at her baseline, overall min assist at time of PT eval, weaker than her baseline. Recommend HHPT and will continue to follow in acute setting  Pt currently with functional limitations due to the deficits listed below (see PT Problem List). Pt will benefit from skilled PT to increase their independence and safety with mobility to allow discharge to the venue listed below.          Recommendations for follow up therapy are one component of a multi-disciplinary discharge planning process, led by the attending physician.  Recommendations may be updated based on patient status, additional functional criteria and insurance authorization.  Follow Up Recommendations Home health PT      Assistance Recommended at Discharge Intermittent Supervision/Assistance  Patient can return home with the following  Assist for transportation;Assistance with cooking/housework;Help with stairs or ramp for entrance    Equipment Recommendations None recommended by PT  Recommendations for Other Services       Functional Status Assessment Patient has had a recent decline in their functional status and demonstrates the ability to make significant improvements in function in a reasonable and predictable amount of time.     Precautions / Restrictions Precautions Precautions: Fall Restrictions Weight Bearing Restrictions: No      Mobility  Bed Mobility               General bed mobility comments: to EOB with OT    Transfers Overall  transfer level: Needs assistance Equipment used: Rolling walker (2 wheels) Transfers: Sit to/from Stand, Bed to chair/wheelchair/BSC Sit to Stand: Min assist   Step pivot transfers: Min assist       General transfer comment: verbal cues for hand placement and to power up with LEs    Ambulation/Gait Ambulation/Gait assistance: Min guard, Min assist Gait Distance (Feet): 50 Feet Assistive device: Rolling walker (2 wheels) Gait Pattern/deviations: Step-through pattern       General Gait Details: assist for balance and RW management. cues for safety and RW position  Stairs            Wheelchair Mobility    Modified Rankin (Stroke Patients Only)       Balance Overall balance assessment: Needs assistance         Standing balance support: Reliant on assistive device for balance, Bilateral upper extremity supported, During functional activity, Single extremity supported Standing balance-Leahy Scale: Fair                               Pertinent Vitals/Pain      Home Living Family/patient expects to be discharged to:: Private residence Living Arrangements: Alone Available Help at Discharge: Family;Available PRN/intermittently Type of Home: House Home Access: Stairs to enter Entrance Stairs-Rails: Right Entrance Stairs-Number of Steps: 4-5   Home Layout: One level Home Equipment: Conservation officer, nature (2 wheels) Additional Comments: children check in on her daily    Prior Function Prior Level of Function : Independent/Modified Independent;Driving             Mobility Comments: no device, independent  Hand Dominance        Extremity/Trunk Assessment   Upper Extremity Assessment Upper Extremity Assessment: Defer to OT evaluation    Lower Extremity Assessment Lower Extremity Assessment: Generalized weakness    Cervical / Trunk Assessment Cervical / Trunk Assessment: Kyphotic  Communication   Communication: No difficulties   Cognition Arousal/Alertness: Awake/alert Behavior During Therapy: WFL for tasks assessed/performed Overall Cognitive Status: Within Functional Limits for tasks assessed                                          General Comments      Exercises     Assessment/Plan    PT Assessment Patient needs continued PT services  PT Problem List Decreased strength;Decreased range of motion;Decreased activity tolerance;Decreased balance;Decreased mobility;Decreased knowledge of precautions;Decreased knowledge of use of DME       PT Treatment Interventions DME instruction;Therapeutic exercise;Gait training;Functional mobility training;Therapeutic activities;Patient/family education;Stair training    PT Goals (Current goals can be found in the Care Plan section)  Acute Rehab PT Goals Patient Stated Goal: back to independent lifestyle PT Goal Formulation: With patient Time For Goal Achievement: 10/06/22 Potential to Achieve Goals: Good    Frequency Min 3X/week     Co-evaluation               AM-PAC PT "6 Clicks" Mobility  Outcome Measure Help needed turning from your back to your side while in a flat bed without using bedrails?: A Little Help needed moving from lying on your back to sitting on the side of a flat bed without using bedrails?: A Little Help needed moving to and from a bed to a chair (including a wheelchair)?: A Little Help needed standing up from a chair using your arms (e.g., wheelchair or bedside chair)?: A Little Help needed to walk in hospital room?: A Little Help needed climbing 3-5 steps with a railing? : A Little 6 Click Score: 18    End of Session Equipment Utilized During Treatment: Gait belt Activity Tolerance: Patient tolerated treatment well Patient left: in chair;with call bell/phone within reach;with family/visitor present   PT Visit Diagnosis: Other abnormalities of gait and mobility (R26.89);Difficulty in walking, not elsewhere  classified (R26.2)    Time: 1043-1100 PT Time Calculation (min) (ACUTE ONLY): 17 min   Charges:   PT Evaluation $PT Eval Low Complexity: [place], PT  Acute Rehab Dept [facility]) [phone]  [facility] Weekend Pager [facility] only)  [phone]  09/22/2022   [facility] 09/22/2022, 11:21 AM

## 2022-09-22 NOTE — Progress Notes (Signed)
[facility] ROUNDING NOTE   Subjective: No further bleeding.  No bowel movement since Friday night.  No other GI symptoms.  Tolerating soft diet.  Hgb stable   Objective: Vital signs in last 24 hours: Temp:  [97.7 F (36.5 C)-98.7 F (37.1 C)] 97.7 F (36.5 C) (12/10 0439) Pulse Rate:  [55-77] 55 (12/10 0439) Resp:  [16-18] 16 (12/10 0439) BP: (117-130)/(50-62) 126/50 (12/10 0439) SpO2:  [97 %-100 %] 98 % (12/10 0439) Last BM Date : 09/21/22 General: NAD, frail elderly Caucasian female, anxious  Lungs:  CTA b/l, no w/r/r Heart:  RRR, no m/r/g Abdomen:  Soft, NT, ND, +BS Ext:  No c/c/e    Intake/Output from previous day: 12/09 0701 - 12/10 0700 In: 628.7 [I.V.:522; IV Piggyback:106.7] Out: 0  Intake/Output this shift: Total I/O In: 120 [P.O.:120] Out: 0    Lab Results: Recent Labs    09/20/22 0302 09/21/22 0235 09/22/22 0428  WBC 7.0 7.7 4.7  HGB 8.0* 7.8* 7.6*  PLT 119* 126* 126*  MCV 96.5 96.9 97.2   BMET Recent Labs    09/20/22 0302 09/21/22 0235 09/22/22 0428  NA 137 138 142  K 3.6 3.9 3.7  CL 109 108 110  CO2 '25 25 28  '$ GLUCOSE 89 82 81  BUN '13 10 8  '$ CREATININE 0.60 0.59 0.52  CALCIUM 7.5* 7.8* 7.8*   LFT Recent Labs    09/20/22 0302 09/21/22 0235 09/22/22 0428  ALBUMIN 2.2* 2.5* 2.3*   PT/INR No results for input(s): "INR" in the last 72 hours.    Imaging/Other results: No results found.    Assessment and Plan:  88 year old female with history of CVA and A-fib on Eliquis admitted with brisk painless GI bleeding, most likely diverticular in etiology.  She received Kcentra and 3 units PRBCs.  Her hemoglobin has been stable the past 24 hours.  She is passing what is most likely old blood. EGD negative for source of bleeding, flex sig with blood in rectum/sigmoid with extensive diverticulosis, but no active bleeding. Suspect her bleeding has significantly slowed if not stopped.  I am okay with advancing her diet to soft.   No plans for further endoscopic intervention unless patient has recurrent brisk bleeding. GI will sign off at this time.  We will arrange outpatient follow-up.  Please contact us if the patient has signs or symptoms of recurrent brisk bleeding. Cardiology has recommended holding Eliquis until outpatient follow-up in February   GI bleed, suspected diverticular hemorrhage - No further intervention planned - Continue soft diet - Recheck CBC in a.m. - Please contact GI if patient has evidence of recurrent active bleeding. - Will contact patient next week with outpatient follow-up appointment -- Recommend daily fiber supplementation as outpatient to reduce risk of recurrent diverticular hemorrhage.    Daryel November, MD  09/22/2022, 10:19 AM [facility]

## 2022-09-22 NOTE — Progress Notes (Signed)
Pt's son shared that he would like for only a select group of people to come up and visit the pt due to concerns about transmitting things that may make her sick, at this time. Placed a sign outside of pt's door for no visitors, per request.

## 2022-09-22 NOTE — Evaluation (Signed)
Occupational Therapy Evaluation Patient Details Name: Kaitlin [name] MRN: [id number] DOB: 06/30/1927 Today's Date: 09/22/2022   History of Present Illness Patient is a 88 year old female who presented on 12/7 with acute rectal bleed. Patient was admitted with 3 units of blood transfused. Patient underwent flexible sigmoidoscopy and upper endoscopy on 12/7.bleed suspected from sigmoid diverticular. EGD ruled out upper GI bleed per chart. PMH: a fib, CVA, LBBB, anxiety, esophageal stricture s/p dilation.   Clinical Impression   Patient is a pleasant 88 year old female who was admitted for above. Patient was living at home with no AD prior level with family check ins daily.  Currently, Patient was in A for transfers with RW with decreased functional activity tolerance noted. Patient was noted to have decreased functional activity tolerance, decreased endurance, decreased standing balance, decreased safety awareness, and decreased knowledge of AD/AE impacting participation in ADLs. Patient would continue to benefit from skilled OT services at this time while admitted and after d/c to address noted deficits in order to improve overall safety and independence in ADLs.        Recommendations for follow up therapy are one component of a multi-disciplinary discharge planning process, led by the attending physician.  Recommendations may be updated based on patient status, additional functional criteria and insurance authorization.   Follow Up Recommendations  Home health OT     Assistance Recommended at Discharge Intermittent Supervision/Assistance  Patient can return home with the following Assistance with cooking/housework;Direct supervision/assist for medications management;Direct supervision/assist for financial management;Help with stairs or ramp for entrance;Assist for transportation    Functional Status Assessment  Patient has had a recent decline in their functional status and  demonstrates the ability to make significant improvements in function in a reasonable and predictable amount of time.  Equipment Recommendations  None recommended by OT       Precautions / Restrictions Precautions Precautions: Fall Restrictions Weight Bearing Restrictions: No      Mobility Bed Mobility Overal bed mobility: Needs Assistance Bed Mobility: Supine to Sit     Supine to sit: Min guard, HOB elevated     General bed mobility comments: with increased time and cues for encouragement    Transfers Overall transfer level: Needs assistance Equipment used: Rolling walker (2 wheels) Transfers: Sit to/from Stand, Bed to chair/wheelchair/BSC Sit to Stand: Min assist     Step pivot transfers: Min assist     General transfer comment: verbal cues for hand placement and to power up with LEs      Balance Overall balance assessment: Needs assistance Sitting-balance support: Bilateral upper extremity supported, Feet supported Sitting balance-Leahy Scale: Fair     Standing balance support: Reliant on assistive device for balance, Bilateral upper extremity supported, During functional activity, Single extremity supported Standing balance-Leahy Scale: Fair           ADL either performed or assessed with clinical judgement   ADL Overall ADL's : Needs assistance/impaired Eating/Feeding: Set up;Sitting   Grooming: Set up;Sitting;Brushing hair;Wash/dry face   Upper Body Bathing: Sitting;Set up   Lower Body Bathing: Minimal assistance;Sitting/lateral leans   Upper Body Dressing : Minimal assistance;Sitting   Lower Body Dressing: Minimal assistance;Sitting/lateral leans   Toilet Transfer: Minimal assistance;Ambulation;Rolling walker (2 wheels) Toilet Transfer Details (indicate cue type and reason): with increased time. 1st attempt standing without AD patient was noted to have weakness and anxiousness that was not precent with RW. Toileting- Water quality scientist and  Hygiene: Min guard;Sit to/from stand Electrical engineer  Details (indicate cue type and reason): to complete hygiene with moments of no UE support whle completing hygiene challanges.       General ADL Comments: patient was noted to have resting tremor in RUE with education on weights and how they would help to reduce tremor like movement. patients RUE was noted to respont to tactile touch to reduce ammount of movement patient verbalized understanding.      Pertinent Vitals/Pain Pain Assessment Pain Assessment: No/denies pain     Hand Dominance Right   Extremity/Trunk Assessment Upper Extremity Assessment Upper Extremity Assessment: Overall WFL for tasks assessed (noted decreased muscle mass in BUE with patietn reporting recent large weight loss.)   Lower Extremity Assessment Lower Extremity Assessment: Defer to PT evaluation   Cervical / Trunk Assessment Cervical / Trunk Assessment: Kyphotic   Communication Communication Communication: No difficulties   Cognition Arousal/Alertness: Awake/alert Behavior During Therapy: WFL for tasks assessed/performed Overall Cognitive Status: Within Functional Limits for tasks assessed                      Home Living Family/patient expects to be discharged to:: Private residence Living Arrangements: Alone Available Help at Discharge: Family;Available PRN/intermittently Type of Home: House Home Access: Stairs to enter [employer] of Steps: 4-5 Entrance Stairs-Rails: Right Home Layout: One level               Home Equipment: Conservation officer, nature (2 wheels)   Additional Comments: children check in on her daily      Prior Functioning/Environment Prior Level of Function : Independent/Modified Independent;Driving             Mobility Comments: no device, independent          OT Problem List: Decreased activity tolerance;Impaired balance (sitting and/or standing);Decreased safety  awareness;Decreased coordination;Decreased knowledge of use of DME or AE      OT Treatment/Interventions: Self-care/ADL training;Energy conservation;Therapeutic exercise;DME and/or AE instruction;Neuromuscular education;Therapeutic activities;Patient/family education;Balance training    OT Goals(Current goals can be found in the care plan section) Acute Rehab OT Goals Patient Stated Goal: to get better OT Goal Formulation: With patient/family Time For Goal Achievement: 10/06/22 Potential to Achieve Goals: Fair  OT Frequency: Min 2X/week    Co-evaluation PT/OT/SLP Co-Evaluation/Treatment: Yes Reason for Co-Treatment: For patient/therapist safety PT goals addressed during session: Mobility/safety with mobility OT goals addressed during session: ADL's and self-care      AM-PAC OT "6 Clicks" Daily Activity     Outcome Measure Help from another person eating meals?: A Little Help from another person taking care of personal grooming?: A Little Help from another person toileting, which includes using toliet, bedpan, or urinal?: A Little Help from another person bathing (including washing, rinsing, drying)?: A Lot Help from another person to put on and taking off regular upper body clothing?: A Little Help from another person to put on and taking off regular lower body clothing?: A Lot 6 Click Score: 16   End of Session Equipment Utilized During Treatment: Gait belt;Rolling walker (2 wheels) Nurse Communication: Other (comment) (ok to participate in session)  Activity Tolerance: Patient tolerated treatment well Patient left: in chair;with call bell/phone within reach;with family/visitor present  OT Visit Diagnosis: Unsteadiness on feet (R26.81);Other abnormalities of gait and mobility (R26.89);Muscle weakness (generalized) (M62.81)                Time: 0093-8182 OT Time Calculation (min): 24 min Charges:  OT General Charges $OT Visit: 1 Visit OT Evaluation $OT Eval  Moderate  Complexity: 1 Mod  Keerthi Hazell OTR/L, MS Acute Rehabilitation Department Office# [phone]   Willa Rough 09/22/2022, 12:36 PM

## 2022-09-22 NOTE — Progress Notes (Signed)
PROGRESS NOTE  Kaitlin [name] WUX:[id number] DOB: [birth year]   PCP: [name], MD  Patient is from: Home.  DOA: 09/18/2022 LOS: 3  Chief complaints Chief Complaint  Patient presents with   Rectal Bleeding     Brief Narrative / Interim history: 88 year old F with PMH of persistent A-fib on Eliquis, CVA, LBBB, anxiety, esophageal stricture s/p dilation and diverticulosis presenting with acute rectal bleed that started the day of presentation, and admitted for acute blood loss anemia due to rectal bleed.  Hgb 10.6 but dropped further to 7.1.  Received Kcentra in ED.  She was transfused 3 units.  CT angio without extravasation but diverticulosis.  [facility] consulted.  EGD showed tortuous esophagus otherwise normal. Flexible sig blood in the rectum and in the sigmoid colon, severe diverticulosis in sigmoid colon and narrowing of colon in association with the diverticular opening.  Bleeding seems to have subsided.  Diet advanced to soft.  Patient has urinary tract infection.  Started on IV ceftriaxone.  Subjective: Seen and examined earlier this morning.  No major events overnight of this morning.  She reports back pain from lying in bed.  Still with dysuria and frequency.  No chest pain or shortness of breath.  Patient's son at bedside.  Objective: Vitals:   09/21/22 1200 09/21/22 1605 09/21/22 2118 09/22/22 0439  BP:  (!) 117/53 130/62 (!) 126/50  Pulse:  71 77 (!) 55  Resp:  '18 16 16  '$ Temp: 98 F (36.7 C) 98.7 F (37.1 C) 98.4 F (36.9 C) 97.7 F (36.5 C)  TempSrc: Oral Oral Oral Oral  SpO2:  97% 100% 98%  Weight:      Height:        Examination:  GENERAL: Appears frail.  Nontoxic. HEENT: MMM.  Vision and hearing grossly intact.  NECK: Supple.  No apparent JVD.  RESP:  No IWOB.  Fair aeration bilaterally. CVS: Regular rhythm.  Normal rate.  Heart sounds normal.  ABD/GI/GU: BS+. Abd soft, NTND.  MSK/EXT:  Moves extremities.  Significant muscle mass and subcu  fat loss. SKIN: no apparent skin lesion or wound NEURO: Awake and alert. Oriented appropriately.  No apparent focal neuro deficit. PSYCH: Calm. Normal affect.   Procedures:  None  Microbiology summarized: MRSA PCR screen nonreactive.  Assessment and plan: Principal Problem:   Acute GI bleeding Active Problems:   Persistent atrial fibrillation (HCC)   ANXIETY DEPRESSION   Diverticulosis of colon with hemorrhage   Chronic back pain   Osteoarthritis   Tachy-brady syndrome (HCC)   History of stroke   Multiple lung nodules on CT   Acute blood loss anemia   Underweight  Acute blood loss anemia due to rectal bleed: Suspect diverticular bleed given history of diverticulosis.  CT did not show extravasation.  Received Kcentra in ED.  Transfused 3 units.  EGD negative except for torturous esophagus.  Flex sig with blood in rectum and sigmoid colon and  severe sigmoid diverticulosis.  Bleeding seems to have subsided. Recent Labs    09/18/22 2113 09/18/22 2235 09/19/22 0805 09/19/22 1210 09/19/22 1715 09/20/22 0302 09/21/22 0235 09/22/22 0428  HGB 10.6* 7.1* 11.7* 10.1* 8.7* 8.0* 7.8* 7.6*  -Cardiology recommended holding Eliquis until outpatient follow-up in February -Check anemia panel.  Transfuse 1 unit.  Discussed with patient and son. -Continue soft diet. -Recheck CBC in the morning -GI signed off.  Persistent A-fib with mild RVR: Not on rate or rhythm control.  On low-dose Eliquis POA.  CHA2DS2-VASc score  7 but she has significant risk for bleeding.  Cardiology recommended holding Eliquis until outpatient follow-up in February. -IV metoprolol 2.5 mg as needed for sustained HR > 120. -Discussed cardiology recommendation with patient and family at bedside.   Multiple lung nodules on CT:  groundglass nodules in the right lower lobe, largest measuring 8 mm noted on CT imaging.  Seems chronic per his CT report from 2020. -Follow-up CT in 12 months recommended.   Anxiety and  depression: On low-dose Xanax as needed at home -Home Xanax 0.25 mg every 8 hours as needed  Acute urinary retention: Had about 730 cc urine output after Foley insertion. -Passed voiding trial.  UTI: Has dysuria and frequency.  UA consistent with UTI.   -IV ceftriaxone -Follow urine culture  Chronic back pain: Stable. -Air mattress, Norco for moderate pain and fentanyl for severe pain -OOB/PT/OT  Possible dysphagia/history of esophageal stricture s/p dilation: Noted to have choking while eating soft diet. -SLP eval -Aspiration precaution  History of CVA: Stable. History of LBBB: Stable  Underweight/unintentional weight loss: Reports about 9 pounds weight loss in the last 8 months. Body mass index is 14.33 kg/m. -Consult dietitian once she started taking p.o.          DVT prophylaxis:  SCDs Start: 09/19/22 0045  Code Status: Full code Family Communication: Updated patient's son at bedside. Level of care: Telemetry Status is: Inpatient Remains inpatient appropriate because: Rectal bleed and urinary tract infection.   Final disposition: Home with home health in the next 24 to 48 hours. Consultants:  GI Cardiology  Sch Meds:  Scheduled Meds:  sodium chloride   Intravenous Once   Chlorhexidine Gluconate Cloth  6 each Topical Daily   lidocaine  1 patch Transdermal Q24H   sodium chloride flush  3 mL Intravenous Q12H   Continuous Infusions:  cefTRIAXone (ROCEPHIN)  IV 1 g (09/22/22 0938)   PRN Meds:.acetaminophen **OR** acetaminophen, ALPRAZolam, fentaNYL (SUBLIMAZE) injection, HYDROcodone-acetaminophen, metoprolol tartrate, ondansetron **OR** ondansetron (ZOFRAN) IV, mouth rinse  Antimicrobials: Anti-infectives (From admission, onward)    Start     Dose/Rate Route Frequency Ordered Stop   09/21/22 1600  cefTRIAXone (ROCEPHIN) 1 g in sodium chloride 0.9 % 100 mL IVPB        1 g 200 mL/hr over 30 Minutes Intravenous Daily 09/21/22 1531 09/26/22 0959         I have personally reviewed the following labs and images: CBC: Recent Labs  Lab 09/18/22 2113 09/18/22 2235 09/19/22 0805 09/19/22 1210 09/19/22 1715 09/20/22 0302 09/21/22 0235 09/22/22 0428  WBC 6.8  --  6.0  --   --  7.0 7.7 4.7  NEUTROABS 5.3  --   --   --   --   --   --   --   HGB 10.6*   < > 11.7* 10.1* 8.7* 8.0* 7.8* 7.6*  HCT 33.8*   < > 35.2* 30.8* 26.4* 24.9* 24.6* 24.0*  MCV 96.3  --  92.4  --   --  96.5 96.9 97.2  PLT 172  --  145*  --   --  119* 126* 126*   < > = values in this interval not displayed.   BMP &GFR Recent Labs  Lab 09/18/22 2227 09/18/22 2235 09/19/22 0805 09/20/22 0302 09/21/22 0235 09/22/22 0428  NA 137 138 138 137 138 142  K 4.1 3.5 3.9 3.6 3.9 3.7  CL 109 106 109 109 108 110  CO2 22  --  25 25 25  28  GLUCOSE 111* 95 110* 89 82 81  BUN '20 20 16 13 10 8  '$ CREATININE 0.38* 0.30* 0.56 0.60 0.59 0.52  CALCIUM 7.6*  --  7.8* 7.5* 7.8* 7.8*  MG  --   --   --  1.8 1.6* 2.0  PHOS  --   --   --  3.4 2.7 2.8   Estimated Creatinine Clearance: 27.1 mL/min (by C-G formula based on SCr of 0.52 mg/dL). Liver & Pancreas: Recent Labs  Lab 09/18/22 2227 09/19/22 0805 09/20/22 0302 09/21/22 0235 09/22/22 0428  AST 24 17  --   --   --   ALT 8 12  --   --   --   ALKPHOS 39 37*  --   --   --   BILITOT 0.7 1.3*  --   --   --   PROT 4.9* 5.0*  --   --   --   ALBUMIN 2.8* 3.0* 2.2* 2.5* 2.3*   No results for input(s): "LIPASE", "AMYLASE" in the last 168 hours. No results for input(s): "AMMONIA" in the last 168 hours. Diabetic: No results for input(s): "HGBA1C" in the last 72 hours. No results for input(s): "GLUCAP" in the last 168 hours. Cardiac Enzymes: No results for input(s): "CKTOTAL", "CKMB", "CKMBINDEX", "TROPONINI" in the last 168 hours. No results for input(s): "PROBNP" in the last 8760 hours. Coagulation Profile: Recent Labs  Lab 09/18/22 2113 09/19/22 0805  INR 1.4* 1.4*   Thyroid Function Tests: No results for input(s):  "TSH", "T4TOTAL", "FREET4", "T3FREE", "THYROIDAB" in the last 72 hours. Lipid Profile: No results for input(s): "CHOL", "HDL", "LDLCALC", "TRIG", "CHOLHDL", "LDLDIRECT" in the last 72 hours. Anemia Panel: Recent Labs    09/22/22 0428  RETICCTPCT 3.2*   Urine analysis:    Component Value Date/Time   COLORURINE YELLOW 09/21/2022 1156   APPEARANCEUR CLOUDY (A) 09/21/2022 1156   LABSPEC 1.012 09/21/2022 1156   PHURINE 5.0 09/21/2022 1156   GLUCOSEU NEGATIVE 09/21/2022 1156   HGBUR MODERATE (A) 09/21/2022 1156   BILIRUBINUR NEGATIVE 09/21/2022 1156   KETONESUR NEGATIVE 09/21/2022 1156   PROTEINUR NEGATIVE 09/21/2022 1156   UROBILINOGEN 0.2 01/25/2014 1657   NITRITE POSITIVE (A) 09/21/2022 1156   LEUKOCYTESUR LARGE (A) 09/21/2022 1156   Sepsis Labs: Invalid input(s): "PROCALCITONIN", "LACTICIDVEN"  Microbiology: Recent Results (from the past 240 hour(s))  MRSA Next Gen by PCR, Nasal     Status: None   Collection Time: 09/19/22  3:45 AM   Specimen: Nasal Mucosa; Nasal Swab  Result Value Ref Range Status   MRSA by PCR Next Gen NOT DETECTED NOT DETECTED Final    Comment: (NOTE) The GeneXpert MRSA Assay (FDA approved for NASAL specimens only), is one component of a comprehensive MRSA colonization surveillance program. It is not intended to diagnose MRSA infection nor to guide or monitor treatment for MRSA infections. Test performance is not FDA approved in patients less than 29 years old. Performed at [facility], [place] [address], [place], [place] [address]     Radiology Studies: No results found.    Teckla Christiansen [employer]  If 7PM-7AM, please contact night-coverage [email or web address] 09/22/2022, 12:32 PM

## 2022-09-23 ENCOUNTER — Encounter (HOSPITAL_COMMUNITY): Payer: Self-pay | Admitting: Internal Medicine

## 2022-09-23 DIAGNOSIS — I4819 Other persistent atrial fibrillation: Secondary | ICD-10-CM | POA: Diagnosis not present

## 2022-09-23 DIAGNOSIS — R918 Other nonspecific abnormal finding of lung field: Secondary | ICD-10-CM | POA: Diagnosis not present

## 2022-09-23 DIAGNOSIS — K922 Gastrointestinal hemorrhage, unspecified: Secondary | ICD-10-CM | POA: Diagnosis not present

## 2022-09-23 DIAGNOSIS — K5900 Constipation, unspecified: Secondary | ICD-10-CM

## 2022-09-23 DIAGNOSIS — D62 Acute posthemorrhagic anemia: Secondary | ICD-10-CM | POA: Diagnosis not present

## 2022-09-23 LAB — CBC
HCT: 29.6 % — ABNORMAL LOW (ref 36.0–46.0)
Hemoglobin: 9.5 g/dL — ABNORMAL LOW (ref 12.0–15.0)
MCH: 30.2 pg (ref 26.0–34.0)
MCHC: 32.1 g/dL (ref 30.0–36.0)
MCV: 94 fL (ref 80.0–100.0)
Platelets: 143 10*3/uL — ABNORMAL LOW (ref 150–400)
RBC: 3.15 MIL/uL — ABNORMAL LOW (ref 3.87–5.11)
RDW: 16 % — ABNORMAL HIGH (ref 11.5–15.5)
WBC: 3.6 10*3/uL — ABNORMAL LOW (ref 4.0–10.5)
nRBC: 0 % (ref 0.0–0.2)

## 2022-09-23 LAB — TYPE AND SCREEN
ABO/RH(D): O POS
Antibody Screen: NEGATIVE
Unit division: 0

## 2022-09-23 LAB — RENAL FUNCTION PANEL
Albumin: 2.5 g/dL — ABNORMAL LOW (ref 3.5–5.0)
Anion gap: 6 (ref 5–15)
BUN: 9 mg/dL (ref 8–23)
CO2: 27 mmol/L (ref 22–32)
Calcium: 8.1 mg/dL — ABNORMAL LOW (ref 8.9–10.3)
Chloride: 108 mmol/L (ref 98–111)
Creatinine, Ser: 0.58 mg/dL (ref 0.44–1.00)
GFR, Estimated: >60 mL/min (ref >60)
Glucose, Bld: 87 mg/dL (ref 70–99)
Phosphorus: 3.8 mg/dL (ref 2.5–4.6)
Potassium: 3.8 mmol/L (ref 3.5–5.1)
Sodium: 141 mmol/L (ref 135–145)

## 2022-09-23 LAB — BPAM RBC
Blood Product Expiration Date: 202401022359
ISSUE DATE / TIME: 202312101401
Unit Type and Rh: 5100

## 2022-09-23 LAB — IRON AND TIBC
Iron: 28 ug/dL (ref 28–170)
Saturation Ratios: 12 % (ref 10.4–31.8)
TIBC: 234 ug/dL — ABNORMAL LOW (ref 250–450)
UIBC: 206 ug/dL

## 2022-09-23 LAB — FOLATE: Folate: 18.2 ng/mL (ref >5.9)

## 2022-09-23 LAB — FERRITIN: Ferritin: 36 ng/mL (ref 11–307)

## 2022-09-23 LAB — VITAMIN B12: Vitamin B-12: 1908 pg/mL — ABNORMAL HIGH (ref 180–914)

## 2022-09-23 LAB — MAGNESIUM: Magnesium: 1.9 mg/dL (ref 1.7–2.4)

## 2022-09-23 MED ORDER — PSYLLIUM 95 % PO PACK
1.0000 | PACK | Freq: Every day | ORAL | Status: DC
  Administered 2022-09-23: 1 via ORAL
  Filled 2022-09-23: qty 1
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
## 2022-09-23 NOTE — Progress Notes (Addendum)
Mobility Specialist - Progress Note   09/23/22 1146  Mobility  Activity Ambulated with assistance to bathroom;Ambulated with assistance in hallway  Level of Assistance Minimal assist, patient does 75% or more  Assistive Device Front wheel walker  Distance Ambulated (ft) 80 ft  Range of Motion/Exercises Active  Activity Response Tolerated well  Mobility Referral Yes  $Mobility charge 1 Mobility   Pt was found in bed and agreeable to ambulate. Was min-A going from sitting to standing and contact guard for ambulation. Stated feeling very weak during ambulation and wanting to ambulate more throughout stay. At [place] returned to recliner chair with son in room. NT and RN notified.  Ferd Hibbs Mobility Specialist

## 2022-09-23 NOTE — Progress Notes (Signed)
PROGRESS NOTE  Kaitlin [name] QIW:[id number] DOB: June 26, 1927   PCP: [name], MD  Patient is from: Home.  DOA: 09/18/2022 LOS: 4  Chief complaints Chief Complaint  Patient presents with   Rectal Bleeding     Brief Narrative / Interim history: 88 year old F with PMH of persistent A-fib on Eliquis, CVA, LBBB, anxiety, esophageal stricture s/p dilation and diverticulosis presenting with acute rectal bleed that started the day of presentation, and admitted for acute blood loss anemia due to rectal bleed.  Hgb 10.6 but dropped further to 7.1.  Received Kcentra in ED.  She was transfused 3 units.  CT angio without extravasation but diverticulosis.  [facility] consulted.  EGD showed tortuous esophagus otherwise normal. Flexible sig blood in the rectum and in the sigmoid colon, severe diverticulosis in sigmoid colon and narrowing of colon in association with the diverticular opening.  Bleeding seems to have subsided.  Transfused additional 1 unit on 12/10.  H&H stable.  Tolerating soft diet.  GI signed off.  Patient has urinary tract infection.  Started on IV ceftriaxone.  Urine culture speciation and sensitivity pending.   Subjective: Seen and examined earlier this morning.  No major events overnight of this morning.  Tolerating soft diet.  Has not had bowel movement.  Concerned about constipation.  Dysuria and frequency seems to have resolved.  Patient's son at bedside.  Objective: Vitals:   09/22/22 1653 09/22/22 2158 09/23/22 0529 09/23/22 1314  BP: (!) 149/69 106/78 123/74 131/64  Pulse: 63 60 76 72  Resp: '18 16 14 14  '$ Temp: (!) 97.5 F (36.4 C) 97.6 F (36.4 C) 97.8 F (36.6 C)   TempSrc: Oral Oral Oral   SpO2: 98% 99% 100% 100%  Weight:      Height:        Examination: GENERAL: Appears frail.  Nontoxic. HEENT: MMM.  Vision and hearing grossly intact.  NECK: Supple.  No apparent JVD.  RESP:  No IWOB.  Fair aeration bilaterally. CVS: Irregular rhythm.  Normal  rate.  Heart sounds normal.  ABD/GI/GU: BS+. Abd soft, NTND.  MSK/EXT:  Moves extremities.  Significant muscle mass and subcu fat loss. SKIN: no apparent skin lesion or wound NEURO: Awake and alert. Oriented appropriately.  No apparent focal neuro deficit. PSYCH: Calm. Normal affect.   Procedures:  None  Microbiology summarized: MRSA PCR screen nonreactive. Urine culture with Klebsiella oxytoca and E. coli  Assessment and plan: Principal Problem:   Acute GI bleeding Active Problems:   Persistent atrial fibrillation (HCC)   ANXIETY DEPRESSION   Diverticulosis of colon with hemorrhage   Chronic back pain   Osteoarthritis   Tachy-brady syndrome (HCC)   History of stroke   Multiple lung nodules on CT   Acute blood loss anemia   Underweight  Acute blood loss anemia due to rectal bleed: Likely diverticular.  CT did not show extravasation.  Received Kcentra in ED.  EGD negative except for torturous esophagus.  Flex sig with blood in rectum and sigmoid colon and  severe sigmoid diverticulosis.  Bleeding seems to have subsided.  H&H stable after a total of 4 units. Recent Labs    09/18/22 2113 09/18/22 2235 09/19/22 0805 09/19/22 1210 09/19/22 1715 09/20/22 0302 09/21/22 0235 09/22/22 0428 09/22/22 2007 09/23/22 0405  HGB 10.6* 7.1* 11.7* 10.1* 8.7* 8.0* 7.8* 7.6* 9.7* 9.5*  -Cardiology recommended holding Eliquis until outpatient follow-up in February -Continue soft diet. -GI signed off.  Persistent A-fib with mild RVR: Not on rate  or rhythm control.  On low-dose Eliquis POA.  CHA2DS2-VASc score 7 but she has significant risk for bleeding.  Cardiology recommended holding Eliquis until outpatient follow-up in February. -IV metoprolol 2.5 mg as needed for sustained HR > 120. -Discussed cardiology recommendation with patient and family at bedside.   Multiple lung nodules on CT:  groundglass nodules in the right lower lobe, largest measuring 8 mm noted on CT imaging.  Seems  chronic per his CT report from 2020. -Follow-up CT in 12 months recommended.   Anxiety and depression: On low-dose Xanax as needed at home -Home Xanax 0.25 mg every 8 hours as needed  Acute urinary retention: Had about 730 cc urine output after Foley insertion. -Passed voiding trial.  UTI:  UA consistent with UTI.  Urine culture with Klebsiella oxytoca and E. coli.  Symptoms improved. -Continue IV ceftriaxone pending urine culture speciation. -Follow urine culture sensitivity.  Chronic back pain: Stable. -Air mattress, Norco for moderate pain and fentanyl for severe pain -OOB/PT/OT  Possible dysphagia/history of esophageal stricture s/p dilation: Noted to have choking while eating soft diet. -SLP eval -Aspiration precaution  Constipation: Could be due to opiate. -Metamucil  History of CVA: Stable. History of LBBB: Stable  Underweight/unintentional weight loss: Reports about 9 pounds weight loss in the last 8 months. Body mass index is 14.33 kg/m. -Consult dietitian once she started taking p.o.          DVT prophylaxis:  SCDs Start: 09/19/22 0045  Code Status: Full code Family Communication: Updated patient's son at bedside. Level of care: Telemetry Status is: Inpatient Remains inpatient appropriate because: Urinary tract infection   Final disposition: Home with home health on 09/24/2022 Consultants:  GI Cardiology  Sch Meds:  Scheduled Meds:  Chlorhexidine Gluconate Cloth  6 each Topical Daily   lidocaine  1 patch Transdermal Q24H   psyllium  1 packet Oral Daily   sodium chloride flush  3 mL Intravenous Q12H   Continuous Infusions:  cefTRIAXone (ROCEPHIN)  IV 1 g (09/23/22 0847)   PRN Meds:.acetaminophen **OR** acetaminophen, ALPRAZolam, fentaNYL (SUBLIMAZE) injection, HYDROcodone-acetaminophen, metoprolol tartrate, ondansetron **OR** ondansetron (ZOFRAN) IV, mouth rinse  Antimicrobials: Anti-infectives (From admission, onward)    Start     Dose/Rate  Route Frequency Ordered Stop   09/21/22 1600  cefTRIAXone (ROCEPHIN) 1 g in sodium chloride 0.9 % 100 mL IVPB        1 g 200 mL/hr over 30 Minutes Intravenous Daily 09/21/22 1531 09/26/22 0959        I have personally reviewed the following labs and images: CBC: Recent Labs  Lab 09/18/22 2113 09/18/22 2235 09/19/22 0805 09/19/22 1210 09/20/22 0302 09/21/22 0235 09/22/22 0428 09/22/22 2007 09/23/22 0405  WBC 6.8  --  6.0  --  7.0 7.7 4.7  --  3.6*  NEUTROABS 5.3  --   --   --   --   --   --   --   --   HGB 10.6*   < > 11.7*   < > 8.0* 7.8* 7.6* 9.7* 9.5*  HCT 33.8*   < > 35.2*   < > 24.9* 24.6* 24.0* 29.3* 29.6*  MCV 96.3  --  92.4  --  96.5 96.9 97.2  --  94.0  PLT 172  --  145*  --  119* 126* 126*  --  143*   < > = values in this interval not displayed.   BMP &GFR Recent Labs  Lab 09/19/22 0805 09/20/22 0302 09/21/22 0235  09/22/22 0428 09/23/22 0405  NA 138 137 138 142 141  K 3.9 3.6 3.9 3.7 3.8  CL 109 109 108 110 108  CO2 '25 25 25 28 27  '$ GLUCOSE 110* 89 82 81 87  BUN '16 13 10 8 9  '$ CREATININE 0.56 0.60 0.59 0.52 0.58  CALCIUM 7.8* 7.5* 7.8* 7.8* 8.1*  MG  --  1.8 1.6* 2.0 1.9  PHOS  --  3.4 2.7 2.8 3.8   Estimated Creatinine Clearance: 27.1 mL/min (by C-G formula based on SCr of 0.58 mg/dL). Liver & Pancreas: Recent Labs  Lab 09/18/22 2227 09/19/22 0805 09/20/22 0302 09/21/22 0235 09/22/22 0428 09/23/22 0405  AST 24 17  --   --   --   --   ALT 8 12  --   --   --   --   ALKPHOS 39 37*  --   --   --   --   BILITOT 0.7 1.3*  --   --   --   --   PROT 4.9* 5.0*  --   --   --   --   ALBUMIN 2.8* 3.0* 2.2* 2.5* 2.3* 2.5*   No results for input(s): "LIPASE", "AMYLASE" in the last 168 hours. No results for input(s): "AMMONIA" in the last 168 hours. Diabetic: No results for input(s): "HGBA1C" in the last 72 hours. No results for input(s): "GLUCAP" in the last 168 hours. Cardiac Enzymes: No results for input(s): "CKTOTAL", "CKMB", "CKMBINDEX",  "TROPONINI" in the last 168 hours. No results for input(s): "PROBNP" in the last 8760 hours. Coagulation Profile: Recent Labs  Lab 09/18/22 2113 09/19/22 0805  INR 1.4* 1.4*   Thyroid Function Tests: No results for input(s): "TSH", "T4TOTAL", "FREET4", "T3FREE", "THYROIDAB" in the last 72 hours. Lipid Profile: No results for input(s): "CHOL", "HDL", "LDLCALC", "TRIG", "CHOLHDL", "LDLDIRECT" in the last 72 hours. Anemia Panel: Recent Labs    09/22/22 0428 09/23/22 0405  VITAMINB12  --  1,908*  FOLATE  --  18.2  FERRITIN  --  36  TIBC  --  234*  IRON  --  28  RETICCTPCT 3.2*  --    Urine analysis:    Component Value Date/Time   COLORURINE YELLOW 09/21/2022 1156   APPEARANCEUR CLOUDY (A) 09/21/2022 1156   LABSPEC 1.012 09/21/2022 1156   PHURINE 5.0 09/21/2022 1156   GLUCOSEU NEGATIVE 09/21/2022 1156   HGBUR MODERATE (A) 09/21/2022 1156   BILIRUBINUR NEGATIVE 09/21/2022 1156   KETONESUR NEGATIVE 09/21/2022 1156   PROTEINUR NEGATIVE 09/21/2022 1156   UROBILINOGEN 0.2 01/25/2014 1657   NITRITE POSITIVE (A) 09/21/2022 1156   LEUKOCYTESUR LARGE (A) 09/21/2022 1156   Sepsis Labs: Invalid input(s): "PROCALCITONIN", "LACTICIDVEN"  Microbiology: Recent Results (from the past 240 hour(s))  MRSA Next Gen by PCR, Nasal     Status: None   Collection Time: 09/19/22  3:45 AM   Specimen: Nasal Mucosa; Nasal Swab  Result Value Ref Range Status   MRSA by PCR Next Gen NOT DETECTED NOT DETECTED Final    Comment: (NOTE) The GeneXpert MRSA Assay (FDA approved for NASAL specimens only), is one component of a comprehensive MRSA colonization surveillance program. It is not intended to diagnose MRSA infection nor to guide or monitor treatment for MRSA infections. Test performance is not FDA approved in patients less than 58 years old. Performed at [facility], [place] [address] [place],  [address]   Urine Culture     Status: Abnormal (Preliminary result)  Collection Time: 09/21/22 11:56 AM   Specimen: Urine, Catheterized  Result Value Ref Range Status   Specimen Description   Final    URINE, CATHETERIZED Performed at [facility], [place] [address] [place], [place] [address]    Special Requests   Final    NONE Performed at [facility], [place] [address], [place], [place] [address]    Culture (A)  Final    >=100,000 COLONIES/mL KLEBSIELLA OXYTOCA >=100,000 COLONIES/mL ESCHERICHIA COLI SUSCEPTIBILITIES TO FOLLOW Performed at [facility] [address] [place], [place] [address]    Report Status PENDING  Incomplete    Radiology Studies: No results found.    Kaitlin [name] T. [place]  If 7PM-7AM, please contact night-coverage [email or web address] 09/23/2022, 2:39 PM

## 2022-09-23 NOTE — Progress Notes (Signed)
Mobility Specialist - Progress Note   09/23/22 1543  Mobility  Activity Ambulated with assistance in hallway  Level of Assistance Minimal assist, patient does 75% or more  Assistive Device Front wheel walker  Distance Ambulated (ft) 45 ft  Range of Motion/Exercises Active  Activity Response Tolerated well  Mobility Referral Yes  $Mobility charge 1 Mobility   Pt was found on recliner chair and agreeable to ambulate. Stated she had just finished ambulating with son in room. Was min-A from sit to stand and contact guard for ambulation. Had a LOB during ambulation and was min-A to correct. At EOS returned to bed with necessities in reach and son in room.  Ferd Hibbs Mobility Specialist

## 2022-09-24 ENCOUNTER — Telehealth: Payer: Self-pay

## 2022-09-24 ENCOUNTER — Inpatient Hospital Stay (HOSPITAL_COMMUNITY): Payer: Medicare Other

## 2022-09-24 DIAGNOSIS — I4819 Other persistent atrial fibrillation: Secondary | ICD-10-CM | POA: Diagnosis not present

## 2022-09-24 DIAGNOSIS — R1084 Generalized abdominal pain: Secondary | ICD-10-CM | POA: Diagnosis not present

## 2022-09-24 DIAGNOSIS — D62 Acute posthemorrhagic anemia: Secondary | ICD-10-CM | POA: Diagnosis not present

## 2022-09-24 DIAGNOSIS — K922 Gastrointestinal hemorrhage, unspecified: Secondary | ICD-10-CM | POA: Diagnosis not present

## 2022-09-24 LAB — HEPATIC FUNCTION PANEL
ALT: 29 U/L (ref 0–44)
AST: 54 U/L — ABNORMAL HIGH (ref 15–41)
Albumin: 3.1 g/dL — ABNORMAL LOW (ref 3.5–5.0)
Alkaline Phosphatase: 50 U/L (ref 38–126)
Bilirubin, Direct: <0.1 mg/dL (ref 0.0–0.2)
Total Bilirubin: 0.5 mg/dL (ref 0.3–1.2)
Total Protein: 5.9 g/dL — ABNORMAL LOW (ref 6.5–8.1)

## 2022-09-24 LAB — CBC
HCT: 29.6 % — ABNORMAL LOW (ref 36.0–46.0)
Hemoglobin: 9.4 g/dL — ABNORMAL LOW (ref 12.0–15.0)
MCH: 30.2 pg (ref 26.0–34.0)
MCHC: 31.8 g/dL (ref 30.0–36.0)
MCV: 95.2 fL (ref 80.0–100.0)
Platelets: 175 10*3/uL (ref 150–400)
RBC: 3.11 MIL/uL — ABNORMAL LOW (ref 3.87–5.11)
RDW: 15.6 % — ABNORMAL HIGH (ref 11.5–15.5)
WBC: 5.1 10*3/uL (ref 4.0–10.5)
nRBC: 0 % (ref 0.0–0.2)

## 2022-09-24 LAB — URINE CULTURE: Culture: >=100000 — AB

## 2022-09-24 LAB — CK: Total CK: 49 U/L (ref 38–234)

## 2022-09-24 LAB — LACTIC ACID, PLASMA
Lactic Acid, Venous: 1.5 mmol/L (ref 0.5–1.9)
Lactic Acid, Venous: 1.4 mmol/L (ref 0.5–1.9)

## 2022-09-24 MED ORDER — POLYETHYLENE GLYCOL 3350 17 G PO PACK
17.0000 g | PACK | Freq: Every day | ORAL | Status: DC
  Administered 2022-09-24 – 2022-09-25 (×2): 17 g via ORAL
  Filled 2022-09-24 (×3): qty 1

## 2022-09-24 MED ORDER — POLYETHYLENE GLYCOL 3350 17 GM/SCOOP PO POWD: 17.0000 g | Freq: Two times a day (BID) | ORAL | 1 refills | Status: DC | PRN

## 2022-09-24 MED ORDER — PSYLLIUM 58.6 % PO PACK: 1.0000 | PACK | Freq: Every day | ORAL | 0 refills | Status: DC

## 2022-09-24 MED ORDER — CEFADROXIL 500 MG PO CAPS: 500.0000 mg | ORAL_CAPSULE | Freq: Two times a day (BID) | ORAL | 0 refills | Status: DC

## 2022-09-24 MED ORDER — CEFADROXIL 500 MG PO CAPS
500.0000 mg | ORAL_CAPSULE | Freq: Once | ORAL | Status: AC
  Administered 2022-09-25: 500 mg via ORAL
  Filled 2022-09-24: qty 1

## 2022-09-24 MED ORDER — HYDROCODONE-ACETAMINOPHEN 5-325 MG PO TABS
1.0000 | ORAL_TABLET | Freq: Four times a day (QID) | ORAL | Status: DC | PRN
  Administered 2022-09-25 (×2): 1 via ORAL
  Filled 2022-09-24: qty 2
  Filled 2022-09-24: qty 1

## 2022-09-24 NOTE — Progress Notes (Signed)
Mobility Specialist - Progress Note   09/24/22 1546  Mobility  Activity Ambulated with assistance in hallway  Level of Assistance Contact guard assist, steadying assist  Assistive Device Front wheel walker  Distance Ambulated (ft) 500 ft  Range of Motion/Exercises Active  Activity Response Tolerated well  Mobility Referral Yes  $Mobility charge 1 Mobility   Pt was found on recliner chair and agreeable to ambulate. Had no complaints during ambulation and at EOS returned to recliner chair with necessities in reach and son in room.   Ferd Hibbs Mobility Specialist

## 2022-09-24 NOTE — Telephone Encounter (Signed)
Hospital follow up Received: 3 days ago Daryel November, MD  Gatha Mayer, MD; Gillermina Hu, RN Remo Lipps, Can you please arrange a routine outpt f/u visit with Dr. Carlean Purl or APP for follow up of GI bleed?  Thanks

## 2022-09-24 NOTE — Telephone Encounter (Signed)
Pt daughter  Peter Congo was made aware of Dr. Candis Schatz recommendations: Pt daughter Peter Congo was scheduled for an office visit with Dr. Carlean Purl on 11/06/2022 at 10:10 AM: Pt daughter  verbalized understanding with all questions answered.

## 2022-09-24 NOTE — Progress Notes (Signed)
PROGRESS NOTE  Kaitlin [name] TKP:[id number] DOB: 11-Apr-1927   PCP: [name], MD  Patient is from: Home.  DOA: 09/18/2022 LOS: 5  Chief complaints Chief Complaint  Patient presents with   Rectal Bleeding     Brief Narrative / Interim history: 88 year old F with PMH of persistent A-fib on Eliquis, CVA, LBBB, anxiety, esophageal stricture s/p dilation and diverticulosis presenting with acute rectal bleed that started the day of presentation, and admitted for acute blood loss anemia due to rectal bleed.  Hgb 10.6 but dropped further to 7.1.  Received Kcentra in ED.  She was transfused 3 units.  CT angio without extravasation but diverticulosis.  [facility] consulted.  EGD showed tortuous esophagus otherwise normal. Flexible sig blood in the rectum and in the sigmoid colon, severe diverticulosis in sigmoid colon and narrowing of colon in association with the diverticular opening.  Bleeding seems to have subsided.  Transfused additional 1 unit on 12/10.  H&H stable.  Tolerating soft diet.  GI signed off.  Patient has urinary tract infection.  Started on IV ceftriaxone.   Therapy recommended SNF.   Subjective: Seen and examined earlier this morning.  Reports having a rough night due to severe abdominal pain and nausea.  He says she was bundled up all night.  Bowel movements but very hard.  No new medication other than Metamucil yesterday.  She tolerated her breakfast this morning.  Patient and daughter anxious about discharge given what happened last night.  Objective: Vitals:   09/23/22 1314 09/23/22 2047 09/24/22 0636 09/24/22 1318  BP: 131/64 (!) 136/55 (!) 107/49 116/66  Pulse: 72 61 87 62  Resp: '14 16 16 17  '$ Temp:  (!) 97.5 F (36.4 C) (!) 97.5 F (36.4 C) (!) 97.5 F (36.4 C)  TempSrc:  Oral Oral Oral  SpO2: 100% 100% 99% 94%  Weight:      Height:        Examination:  GENERAL: Appears frail.  Anxious looking. HEENT: MMM.  Vision and hearing grossly intact.   NECK: Supple.  No apparent JVD.  RESP:  No IWOB.  Fair aeration bilaterally. CVS: Irregular rhythm.  Normal rate.  Heart sounds normal.  ABD/GI/GU: BS+. Abd soft.  Mild diffuse tenderness.  MSK/EXT:  Moves extremities. No apparent deformity. No edema.  SKIN: no apparent skin lesion or wound NEURO: Awake and alert. Oriented appropriately.  Resting tremor in right arm.  No apparent focal neuro deficit. PSYCH: Appears anxious.  Procedures:  None  Microbiology summarized: MRSA PCR screen nonreactive. Urine culture with pansensitive Klebsiella oxytoca and E. coli  Assessment and plan: Principal Problem:   Acute GI bleeding Active Problems:   Persistent atrial fibrillation (HCC)   ANXIETY DEPRESSION   Diverticulosis of colon with hemorrhage   Chronic back pain   Osteoarthritis   Tachy-brady syndrome (HCC)   History of stroke   Multiple lung nodules on CT   Acute blood loss anemia   Underweight  Acute blood loss anemia due to rectal bleed: Likely diverticular.  CT did not show extravasation.  Received Kcentra in ED.  EGD negative except for torturous esophagus.  Flex sig with blood in rectum and sigmoid colon and  severe sigmoid diverticulosis.  Bleeding seems to have subsided.  H&H stable after a total of 4 units. Recent Labs    09/18/22 2235 09/19/22 0805 09/19/22 1210 09/19/22 1715 09/20/22 0302 09/21/22 0235 09/22/22 0428 09/22/22 2007 09/23/22 0405 09/24/22 0427  HGB 7.1* 11.7* 10.1* 8.7* 8.0* 7.8*  7.6* 9.7* 9.5* 9.4*  -Cardiology recommended holding Eliquis until outpatient follow-up in February -Continue soft diet. -GI signed off.  Persistent A-fib with mild RVR: Not on rate or rhythm control.  On low-dose Eliquis POA.  CHA2DS2-VASc score 7 but she has significant risk for bleeding.  Cardiology recommended holding Eliquis until outpatient follow-up in February. -IV metoprolol 2.5 mg as needed for sustained HR > 120. -Discussed cardiology recommendation with  patient and family at bedside.   Multiple lung nodules on CT:  groundglass nodules in the right lower lobe, largest measuring 8 mm noted on CT imaging.  Seems chronic per his CT report from 2020. -Follow-up CT in 12 months recommended.   Anxiety and depression: On low-dose Xanax as needed at home -Home Xanax 0.25 mg every 8 hours as needed  Acute urinary retention: Had about 730 cc urine output after Foley insertion. -Passed voiding trial.  UTI:  UA consistent with UTI.  Urine culture with pansensitive Klebsiella oxytoca and E. coli.  Symptoms improved. -Received IV ceftriaxone for 4 days.  Will finish 5 days course with p.o. cefadroxil 500 mg on 12/13.  Chronic back pain: Stable. -Air mattress, Norco for moderate pain and fentanyl for severe pain -MiraLAX for constipation.  Would avoid senna given diverticular bleed. -OOB/PT/OT  Possible dysphagia/history of esophageal stricture s/p dilation: Noted to have choking while eating soft diet. -SLP eval -Aspiration precaution  Abdominal pain/nausea: He had severe abdominal pain and nausea the night of 12/11.  She had small bowel movements.  Abdominal pain and nausea seems to have resolved.  She tolerated breakfast this morning.  Abdominal exam benign.  KUB and LFT without significant finding.  He has persistent A-fib not on anticoagulation but I doubt this is related to A-fib.  Lactic acid within normal.  -Discontinue Metamucil -MiraLAX daily  Constipation: Could be due to opiate.  Started on Metamucil.  She had BM but small and hard.  Unfortunately, she has significant pain and nausea overnight -Continue Metamucil -Try scheduled MiraLAX once a day -Would avoid senna given diverticular bleed.  History of CVA: Stable. History of LBBB: Stable  Underweight/unintentional weight loss: Reports about 9 pounds weight loss in the last 8 months. Body mass index is 14.33 kg/m. -Consult dietitian once she started taking p.o.          DVT  prophylaxis:  SCDs Start: 09/19/22 0045  Code Status: Full code Family Communication: Updated patient's son and daughter at bedside. Level of care: Med-Surg Status is: Inpatient Remains inpatient appropriate because: Urinary tract infection   Final disposition: Home with home health on 09/24/2022 Consultants:  GI Cardiology  Sch Meds:  Scheduled Meds:  [START ON 09/25/2022] cefadroxil  500 mg Oral Once   Chlorhexidine Gluconate Cloth  6 each Topical Daily   lidocaine  1 patch Transdermal Q24H   polyethylene glycol  17 g Oral Daily   sodium chloride flush  3 mL Intravenous Q12H   Continuous Infusions:   PRN Meds:.acetaminophen **OR** acetaminophen, ALPRAZolam, fentaNYL (SUBLIMAZE) injection, HYDROcodone-acetaminophen, metoprolol tartrate, ondansetron **OR** ondansetron (ZOFRAN) IV, mouth rinse  Antimicrobials: Anti-infectives (From admission, onward)    Start     Dose/Rate Route Frequency Ordered Stop   09/25/22 0800  cefadroxil (DURICEF) capsule 500 mg        500 mg Oral Once 09/24/22 1409     09/24/22 0000  cefadroxil (DURICEF) 500 MG capsule  Status:  Discontinued        500 mg Oral 2 times daily 09/24/22 0744  09/24/22    09/21/22 1600  cefTRIAXone (ROCEPHIN) 1 g in sodium chloride 0.9 % 100 mL IVPB  Status:  Discontinued        1 g 200 mL/hr over 30 Minutes Intravenous Daily 09/21/22 1531 09/24/22 1409        I have personally reviewed the following labs and images: CBC: Recent Labs  Lab 09/18/22 2113 09/18/22 2235 09/20/22 0302 09/21/22 0235 09/22/22 0428 09/22/22 2007 09/23/22 0405 09/24/22 0427  WBC 6.8   < > 7.0 7.7 4.7  --  3.6* 5.1  NEUTROABS 5.3  --   --   --   --   --   --   --   HGB 10.6*   < > 8.0* 7.8* 7.6* 9.7* 9.5* 9.4*  HCT 33.8*   < > 24.9* 24.6* 24.0* 29.3* 29.6* 29.6*  MCV 96.3   < > 96.5 96.9 97.2  --  94.0 95.2  PLT 172   < > 119* 126* 126*  --  143* 175   < > = values in this interval not displayed.   BMP &GFR Recent Labs  Lab  09/19/22 0805 09/20/22 0302 09/21/22 0235 09/22/22 0428 09/23/22 0405  NA 138 137 138 142 141  K 3.9 3.6 3.9 3.7 3.8  CL 109 109 108 110 108  CO2 '25 25 25 28 27  '$ GLUCOSE 110* 89 82 81 87  BUN '16 13 10 8 9  '$ CREATININE 0.56 0.60 0.59 0.52 0.58  CALCIUM 7.8* 7.5* 7.8* 7.8* 8.1*  MG  --  1.8 1.6* 2.0 1.9  PHOS  --  3.4 2.7 2.8 3.8   Estimated Creatinine Clearance: 27.1 mL/min (by C-G formula based on SCr of 0.58 mg/dL). Liver & Pancreas: Recent Labs  Lab 09/18/22 2227 09/19/22 0805 09/20/22 0302 09/21/22 0235 09/22/22 0428 09/23/22 0405 09/24/22 1050  AST 24 17  --   --   --   --  54*  ALT 8 12  --   --   --   --  29  ALKPHOS 39 37*  --   --   --   --  50  BILITOT 0.7 1.3*  --   --   --   --  0.5  PROT 4.9* 5.0*  --   --   --   --  5.9*  ALBUMIN 2.8* 3.0* 2.2* 2.5* 2.3* 2.5* 3.1*   No results for input(s): "LIPASE", "AMYLASE" in the last 168 hours. No results for input(s): "AMMONIA" in the last 168 hours. Diabetic: No results for input(s): "HGBA1C" in the last 72 hours. No results for input(s): "GLUCAP" in the last 168 hours. Cardiac Enzymes: No results for input(s): "CKTOTAL", "CKMB", "CKMBINDEX", "TROPONINI" in the last 168 hours. No results for input(s): "PROBNP" in the last 8760 hours. Coagulation Profile: Recent Labs  Lab 09/18/22 2113 09/19/22 0805  INR 1.4* 1.4*   Thyroid Function Tests: No results for input(s): "TSH", "T4TOTAL", "FREET4", "T3FREE", "THYROIDAB" in the last 72 hours. Lipid Profile: No results for input(s): "CHOL", "HDL", "LDLCALC", "TRIG", "CHOLHDL", "LDLDIRECT" in the last 72 hours. Anemia Panel: Recent Labs    09/22/22 0428 09/23/22 0405  VITAMINB12  --  1,908*  FOLATE  --  18.2  FERRITIN  --  36  TIBC  --  234*  IRON  --  28  RETICCTPCT 3.2*  --    Urine analysis:    Component Value Date/Time   COLORURINE YELLOW 09/21/2022 1156   APPEARANCEUR CLOUDY (A) 09/21/2022 1156  LABSPEC 1.012 09/21/2022 1156   PHURINE 5.0  09/21/2022 1156   GLUCOSEU NEGATIVE 09/21/2022 1156   HGBUR MODERATE (A) 09/21/2022 1156   BILIRUBINUR NEGATIVE 09/21/2022 1156   KETONESUR NEGATIVE 09/21/2022 1156   PROTEINUR NEGATIVE 09/21/2022 1156   UROBILINOGEN 0.2 01/25/2014 1657   NITRITE POSITIVE (A) 09/21/2022 1156   LEUKOCYTESUR LARGE (A) 09/21/2022 1156   Sepsis Labs: Invalid input(s): "PROCALCITONIN", "LACTICIDVEN"  Microbiology: Recent Results (from the past 240 hour(s))  MRSA Next Gen by PCR, Nasal     Status: None   Collection Time: 09/19/22  3:45 AM   Specimen: Nasal Mucosa; Nasal Swab  Result Value Ref Range Status   MRSA by PCR Next Gen NOT DETECTED NOT DETECTED Final    Comment: (NOTE) The GeneXpert MRSA Assay (FDA approved for NASAL specimens only), is one component of a comprehensive MRSA colonization surveillance program. It is not intended to diagnose MRSA infection nor to guide or monitor treatment for MRSA infections. Test performance is not FDA approved in patients less than 71 years old. Performed at [facility], [place] [address], [place], [place] [address]   Urine Culture     Status: Abnormal   Collection Time: 09/21/22 11:56 AM   Specimen: Urine, Catheterized  Result Value Ref Range Status   Specimen Description   Final    URINE, CATHETERIZED Performed at [facility] [address], [place], [place] [address]    Special Requests   Final    NONE Performed at [facility], [place] [address] [place],  [address]    Culture (A)  Final    >=100,000 COLONIES/mL KLEBSIELLA OXYTOCA >=100,000 COLONIES/mL ESCHERICHIA COLI    Report Status 09/24/2022 FINAL  Final   Organism ID, Bacteria KLEBSIELLA OXYTOCA (A)  Final   Organism ID, Bacteria ESCHERICHIA COLI (A)  Final      Susceptibility   Escherichia coli - MIC*    AMPICILLIN 4 SENSITIVE Sensitive     CEFAZOLIN <=4 SENSITIVE Sensitive     CEFEPIME <=0.12 SENSITIVE Sensitive      CEFTRIAXONE <=0.25 SENSITIVE Sensitive     CIPROFLOXACIN <=0.25 SENSITIVE Sensitive     GENTAMICIN <=1 SENSITIVE Sensitive     IMIPENEM <=0.25 SENSITIVE Sensitive     NITROFURANTOIN <=16 SENSITIVE Sensitive     TRIMETH/SULFA <=20 SENSITIVE Sensitive     AMPICILLIN/SULBACTAM <=2 SENSITIVE Sensitive     PIP/TAZO <=4 SENSITIVE Sensitive     * >=100,000 COLONIES/mL ESCHERICHIA COLI   Klebsiella oxytoca - MIC*    AMPICILLIN >=32 RESISTANT Resistant     CEFAZOLIN <=4 SENSITIVE Sensitive     CEFEPIME <=0.12 SENSITIVE Sensitive     CEFTRIAXONE <=0.25 SENSITIVE Sensitive     CIPROFLOXACIN <=0.25 SENSITIVE Sensitive     GENTAMICIN <=1 SENSITIVE Sensitive     IMIPENEM <=0.25 SENSITIVE Sensitive     NITROFURANTOIN 32 SENSITIVE Sensitive     TRIMETH/SULFA <=20 SENSITIVE Sensitive     AMPICILLIN/SULBACTAM 8 SENSITIVE Sensitive     PIP/TAZO <=4 SENSITIVE Sensitive     * >=100,000 COLONIES/mL KLEBSIELLA OXYTOCA    Radiology Studies: DG Abd Portable 1V  Result Date: 09/24/2022 CLINICAL DATA:  Abdominal pain EXAM: PORTABLE ABDOMEN - 1 VIEW COMPARISON:  None Available. FINDINGS: No dilated large or small bowel. Gas and stool in the rectum. Surgical clips in the RIGHT abdomen suggest gallbladder surgery. No pathologic calcifications. Degenerative changes of the spine. IMPRESSION: No acute findings Electronically Signed   By: Helane Gunther.D.  On: 09/24/2022 10:49      Ginger Leeth T. Grafton  If 7PM-7AM, please contact night-coverage [email or web address] 09/24/2022, 2:16 PM

## 2022-09-24 NOTE — Progress Notes (Signed)
Physical Therapy Treatment Patient Details Name: Kaitlin [name] MRN: [id number] DOB: [birth year] Today's Date: 09/24/2022   History of Present Illness Patient is a 88 year old female who presented on 12/7 with acute rectal bleed. Patient was admitted with 3 units of blood transfused. Patient underwent flexible sigmoidoscopy and upper endoscopy on 12/7.bleed suspected from sigmoid diverticular. EGD ruled out upper GI bleed per chart. PMH: a fib, CVA, LBBB, anxiety, esophageal stricture s/p dilation.    PT Comments    Focus of session was to educate pt and son on handling and cueing for patient at home. Reviewed sit to stand, cues for RW safety  and gait belt for walking. Reviewed home exercise program and handout given to patient and son. Also how to progress pt and still recommending HHPT to continue with progressing pt in home for pt's goal is independent again with no assistive device. Also recommending 3 n1 due to pt will be confined in 1 room especially with unpredictable diverticulitis and bathroom needs frequently, this would be the safest method when needed.   HEP Access code [place].   Recommendations for follow up therapy are one component of a multi-disciplinary discharge planning process, led by the attending physician.  Recommendations may be updated based on patient status, additional functional criteria and insurance authorization.  Follow Up Recommendations  Home health PT     Assistance Recommended at Discharge    Patient can return home with the following Assist for transportation;Assistance with cooking/housework;Help with stairs or ramp for entrance   Equipment Recommendations  BSC/3in1 (son to find RW)    Recommendations for Other Services       Precautions / Restrictions Restrictions Weight Bearing Restrictions: No     Mobility  Bed Mobility                    Transfers     Transfers: Sit to/from Stand Sit to Stand: Min  guard   Step pivot transfers: Min guard       General transfer comment: verbal cues to come to edge of surface and where to push up from using her hands on chair instead of recliner. Son there too for education    Ambulation/Gait Ambulation/Gait assistance: Min guard Gait Distance (Feet): 20 Feet Assistive device: Rolling walker (2 wheels) Gait Pattern/deviations: Step-through pattern       General Gait Details: assist for balance and RW management. cues for safety and RW position   Stairs             Wheelchair Mobility    Modified Rankin (Stroke Patients Only)       Balance Overall balance assessment: Needs assistance Sitting-balance support: Bilateral upper extremity supported, Feet supported Sitting balance-Leahy Scale: Good     Standing balance support: Reliant on assistive device for balance, Bilateral upper extremity supported, During functional activity Standing balance-Leahy Scale: Fair                              Cognition Arousal/Alertness: Awake/alert Behavior During Therapy: WFL for tasks assessed/performed Overall Cognitive Status: Within Functional Limits for tasks assessed                                          Exercises Other Exercises Other Exercises: printed general seated adn standing supported exercises in [place] Access  code [place]. Given to son and reviewed and he is aware. Also reviewed pt to walk 3 times a day in home.    General Comments        Pertinent Vitals/Pain Pain Assessment Pain Assessment: Faces Faces Pain Scale: Hurts a little bit (much better than before)    Home Living                          Prior Function            PT Goals (current goals can now be found in the care plan section) Acute Rehab PT Goals Patient Stated Goal: back to independent lifestyle PT Goal Formulation: With patient Time For Goal Achievement: 10/06/22 Potential to Achieve Goals:  Good Progress towards PT goals: Progressing toward goals    Frequency    Min 3X/week      PT Plan Current plan remains appropriate    Co-evaluation              AM-PAC PT "6 Clicks" Mobility   Outcome Measure  Help needed turning from your back to your side while in a flat bed without using bedrails?: A Little Help needed moving from lying on your back to sitting on the side of a flat bed without using bedrails?: A Little Help needed moving to and from a bed to a chair (including a wheelchair)?: A Little Help needed standing up from a chair using your arms (e.g., wheelchair or bedside chair)?: A Little Help needed to walk in hospital room?: A Little Help needed climbing 3-5 steps with a railing? : A Little 6 Click Score: 18    End of Session Equipment Utilized During Treatment: Gait belt Activity Tolerance: Patient tolerated treatment well Patient left: in chair;with call bell/phone within reach;with family/visitor present (pt's son in room .gave him the okay to ambulate w/ pt now that he completed safety education and he will be w/ her at home. No chair alarm needed at this time due to son's presence and pt verablly demonstrated how to call for help,not get up on her own.) Nurse Communication: Mobility status PT Visit Diagnosis: Other abnormalities of gait and mobility (R26.89);Difficulty in walking, not elsewhere classified (R26.2)     Time: 1120-1150 PT Time Calculation (min) (ACUTE ONLY): 30 min  Charges:  $Gait Training: 8-22 mins                     Gatha Mayer, PT, MPT Acute Rehabilitation Services Office: [phone] If a weekend: [facility] w/e pager [phone] 09/24/2022    Clide Dales 09/24/2022, 12:08 PM

## 2022-09-24 NOTE — Care Management Important Message (Signed)
Important Message  Patient Details IM Letter given. Name: Spenser Cong MRN: [id number] Date of Birth: 10-10-1927   Medicare Important Message Given:  Yes     Kerin Salen 09/24/2022, 9:23 AM

## 2022-09-24 NOTE — Progress Notes (Signed)
Mobility Specialist - Progress Note   09/24/22 1034  Mobility  Activity Ambulated with assistance in hallway  Level of Assistance Minimal assist, patient does 75% or more  Assistive Device Front wheel walker  Distance Ambulated (ft) 200 ft  Range of Motion/Exercises Active  Activity Response Tolerated well  Mobility Referral Yes  $Mobility charge 1 Mobility   Pt was found in bed and agreeable to ambulate. Stated feeling better than last night but feeling very weak. She had 2 brief standing rest breaks during session and at EOS returned to recliner chair with necessities in reach and son in room.  Ferd Hibbs Mobility Specialist

## 2022-09-24 NOTE — Progress Notes (Signed)
Chaplain engaged in an initial visit with [name] and her son regarding Financial controller, Healthcare POA.  [name] voiced that she has done the paperwork before and has a will through her lawyer outlining her needs and desires.  Chaplain voiced that they could bring the document in to be scanned into the system.  Chaplain left an Advanced Directive packet with them.    Chaplain does not assess a pertinent need for the Healthcare POA document as her children are working together to care for [facility], she also has completed other medical forms.    Chaplain provided prayer over Craig who desires to get well and become mobile again.  She stated that she wants to continue living a long life.    Chaplain offered education, listening, and presence.   Bea Graff, MDiv    09/24/22 1400  Clinical Encounter Type  Visited With Patient and family together  Visit Type Spiritual support;Social support  Referral From Family  Consult/Referral To Chaplain  Spiritual Encounters  Spiritual Needs Prayer;Literature;Brochure

## 2022-09-25 ENCOUNTER — Inpatient Hospital Stay (HOSPITAL_COMMUNITY): Payer: Medicare Other

## 2022-09-25 DIAGNOSIS — D62 Acute posthemorrhagic anemia: Secondary | ICD-10-CM | POA: Diagnosis not present

## 2022-09-25 DIAGNOSIS — M5459 Other low back pain: Secondary | ICD-10-CM | POA: Insufficient documentation

## 2022-09-25 DIAGNOSIS — I4819 Other persistent atrial fibrillation: Secondary | ICD-10-CM | POA: Diagnosis not present

## 2022-09-25 DIAGNOSIS — R918 Other nonspecific abnormal finding of lung field: Secondary | ICD-10-CM | POA: Diagnosis not present

## 2022-09-25 DIAGNOSIS — K922 Gastrointestinal hemorrhage, unspecified: Secondary | ICD-10-CM | POA: Diagnosis not present

## 2022-09-25 LAB — COMPREHENSIVE METABOLIC PANEL
ALT: 38 U/L (ref 0–44)
AST: 55 U/L — ABNORMAL HIGH (ref 15–41)
Albumin: 2.5 g/dL — ABNORMAL LOW (ref 3.5–5.0)
Alkaline Phosphatase: 44 U/L (ref 38–126)
Anion gap: 6 (ref 5–15)
BUN: 9 mg/dL (ref 8–23)
CO2: 28 mmol/L (ref 22–32)
Calcium: 8 mg/dL — ABNORMAL LOW (ref 8.9–10.3)
Chloride: 102 mmol/L (ref 98–111)
Creatinine, Ser: 0.63 mg/dL (ref 0.44–1.00)
GFR, Estimated: >60 mL/min (ref >60)
Glucose, Bld: 97 mg/dL (ref 70–99)
Potassium: 3.9 mmol/L (ref 3.5–5.1)
Sodium: 136 mmol/L (ref 135–145)
Total Bilirubin: 0.5 mg/dL (ref 0.3–1.2)
Total Protein: 5.2 g/dL — ABNORMAL LOW (ref 6.5–8.1)

## 2022-09-25 LAB — CBC
HCT: 28.7 % — ABNORMAL LOW (ref 36.0–46.0)
Hemoglobin: 9.1 g/dL — ABNORMAL LOW (ref 12.0–15.0)
MCH: 30.3 pg (ref 26.0–34.0)
MCHC: 31.7 g/dL (ref 30.0–36.0)
MCV: 95.7 fL (ref 80.0–100.0)
Platelets: 198 10*3/uL (ref 150–400)
RBC: 3 MIL/uL — ABNORMAL LOW (ref 3.87–5.11)
RDW: 15.3 % (ref 11.5–15.5)
WBC: 9.1 10*3/uL (ref 4.0–10.5)
nRBC: 0 % (ref 0.0–0.2)

## 2022-09-25 LAB — MAGNESIUM: Magnesium: 1.8 mg/dL (ref 1.7–2.4)

## 2022-09-25 LAB — CK: Total CK: 32 U/L — ABNORMAL LOW (ref 38–234)

## 2022-09-25 MED ORDER — IOHEXOL 9 MG/ML PO SOLN
500.0000 mL | ORAL | Status: AC
  Administered 2022-09-25 (×2): 500 mL via ORAL

## 2022-09-25 MED ORDER — SENNA-DOCUSATE SODIUM 8.6-50 MG PO TABS: 1.0000 | ORAL_TABLET | Freq: Every day | ORAL | Status: DC | PRN

## 2022-09-25 MED ORDER — ENSURE ENLIVE PO LIQD
237.0000 mL | Freq: Two times a day (BID) | ORAL | Status: DC
  Administered 2022-09-26 – 2022-09-30 (×3): 237 mL via ORAL

## 2022-09-25 MED ORDER — OXYCODONE HCL 5 MG PO TABS
5.0000 mg | ORAL_TABLET | Freq: Three times a day (TID) | ORAL | Status: DC | PRN
  Administered 2022-09-25 – 2022-09-30 (×13): 5 mg via ORAL
  Filled 2022-09-25 (×16): qty 1

## 2022-09-25 MED ORDER — PIPERACILLIN-TAZOBACTAM 3.375 G IVPB
3.3750 g | Freq: Three times a day (TID) | INTRAVENOUS | Status: DC
  Administered 2022-09-25 – 2022-09-26 (×3): 3.375 g via INTRAVENOUS
  Filled 2022-09-25 (×3): qty 50

## 2022-09-25 MED ORDER — DICLOFENAC SODIUM 1 % EX GEL
2.0000 g | Freq: Four times a day (QID) | CUTANEOUS | Status: DC
  Filled 2022-09-25: qty 100

## 2022-09-25 MED ORDER — ADULT MULTIVITAMIN W/MINERALS CH
1.0000 | ORAL_TABLET | Freq: Every day | ORAL | Status: DC
  Administered 2022-09-25 – 2022-09-28 (×3): 1 via ORAL
  Filled 2022-09-25 (×3): qty 1

## 2022-09-25 MED ORDER — ACETAMINOPHEN 325 MG PO TABS
650.0000 mg | ORAL_TABLET | Freq: Four times a day (QID) | ORAL | Status: DC
  Administered 2022-09-25 – 2022-09-30 (×19): 650 mg via ORAL
  Filled 2022-09-25 (×21): qty 2

## 2022-09-25 MED ORDER — IOHEXOL 9 MG/ML PO SOLN
ORAL | Status: AC
  Filled 2022-09-25: qty 1000

## 2022-09-25 MED ORDER — IOHEXOL 300 MG/ML  SOLN
80.0000 mL | Freq: Once | INTRAMUSCULAR | Status: AC | PRN
  Administered 2022-09-25: 80 mL via INTRAVENOUS

## 2022-09-25 MED ORDER — CEFADROXIL 500 MG PO CAPS
500.0000 mg | ORAL_CAPSULE | Freq: Two times a day (BID) | ORAL | Status: DC
  Filled 2022-09-25: qty 1

## 2022-09-25 NOTE — Progress Notes (Signed)
Initial Nutrition Assessment  DOCUMENTATION CODES:   Underweight  INTERVENTION:   -Ensure Plus High Protein po BID, each supplement provides 350 kcal and 20 grams of protein.   -Multivitamin with minerals daily  -Placed "High Calorie, High Protein" handout in AVS  NUTRITION DIAGNOSIS:   Increased nutrient needs related to acute illness as evidenced by estimated needs.  GOAL:   Patient will meet greater than or equal to 90% of their needs  MONITOR:   PO intake, Supplement acceptance, Labs, Weight trends, I & O's  REASON FOR ASSESSMENT:   Consult Assessment of nutrition requirement/status  ASSESSMENT:   88 year old F with PMH of persistent A-fib on Eliquis, CVA, LBBB, anxiety, esophageal stricture s/p dilation and diverticulosis presenting with acute rectal bleed that started the day of presentation, and admitted for acute blood loss anemia due to rectal bleed.  Patient having CT this morning d/t continued back pain.  Pt was admitted 12/6 d/t GI bleed. Pt underwent upper GI and flex sigmoidoscopy on 12/7.  Pt was evaluated by SLP 12/11, recommended dysphagia 3 diet. Family preferred pt remain on regular diet so pt can have foods she enjoys. PTA pt was eating but was eating small amounts. Pt was having regurgitation following meals as well.   Pt has been unable to maintain weight, has lost 10 lbs since 3/15 (10% wt loss x 9 months, insignificant for time frame but not ideal given advanced age).  Pt underweight. Suspect some degree of malnutrition but unable to diagnose at this time. Will attempt at follow-up.  Will order Ensure supplements for additional kcals and protein. Will add daily MVI as well given poor PO.  Medications: Miralax  Labs reviewed.  NUTRITION - FOCUSED PHYSICAL EXAM:  Unable to complete, working remotely.   Diet Order:   Diet Order             Diet regular Room service appropriate? Yes; Fluid consistency: Thin  Diet effective now            Diet general                   EDUCATION NEEDS:   Not appropriate for education at this time  Skin:  Skin Assessment: Reviewed RN Assessment  Last BM:  12/12 -type 6  Height:   Ht Readings from Last 1 Encounters:  09/18/22 '5\' 5"'$  (1.651 m)    Weight:   Wt Readings from Last 1 Encounters:  09/18/22 39.1 kg    BMI:  Body mass index is 14.33 kg/m.  Estimated Nutritional Needs:   Kcal:  1500-1700  Protein:  70-85g  Fluid:  1.5L/day   Clayton Bibles, MS, RD, LDN Inpatient Clinical Dietitian Contact information available via Amion

## 2022-09-25 NOTE — Progress Notes (Signed)
Pharmacy Antibiotic Note  Kaitlin [name] is a 88 y.o. female admitted on 09/18/2022 with rectal bleeding.  Pharmacy has been consulted for Zosyn dosing for possible nephritic abscesses.  Plan: Zosyn 3.375gm IV q8h (4hr extended infusions) Follow up renal function & cultures  Height: '5\' 5"'$  (165.1 cm) Weight: 39.1 kg (86 lb 1.6 oz) IBW/kg (Calculated) : 57  Temp (24hrs), Avg:98.7 F (37.1 C), Min:97.9 F (36.6 C), Max:99.5 F (37.5 C)  Recent Labs  Lab 09/18/22 2305 09/19/22 0805 09/20/22 0302 09/21/22 0235 09/22/22 0428 09/23/22 0405 09/24/22 0427 09/24/22 1050 09/24/22 1219 09/25/22 0456  WBC  --    < > 7.0 7.7 4.7 3.6* 5.1  --   --  9.1  CREATININE  --    < > 0.60 0.59 0.52 0.58  --   --   --  0.63  LATICACIDVEN 1.2  --   --   --   --   --   --  1.5 1.4  --    < > = values in this interval not displayed.    Estimated Creatinine Clearance: 27.1 mL/min (by C-G formula based on SCr of 0.63 mg/dL).    Allergies  Allergen Reactions   Dilaudid [Hydromorphone Hcl] Other (See Comments)    Dropped heart rate really low   Ciprofloxacin Other (See Comments)    Dizziness   Doxycycline     Sever dizziness and nausea   Gabapentin     Loss of balance, "weird feeling"   Codeine Nausea And Vomiting and Rash   Morphine And Related Nausea And Vomiting and Rash   Nizatidine Other (See Comments)    Unknown reaction     Antimicrobials this admission: 12/9 Ceftriaxone >>12/12 12/13 Cefadroxil >> 12/13 12/13 Zosyn >>  Dose adjustments this admission:  Microbiology results: 12/9 UCx: >100k Klebsiella R amp only; >100k E.coli pansensitive 12/13 BCx:  Thank you for allowing pharmacy to be a part of this patient's care.  Peggyann Juba, PharmD, BCPS Pharmacy: [phone] 09/25/2022 6:57 PM

## 2022-09-25 NOTE — Plan of Care (Signed)
Mild temp to 99.34F about 2 hours after she received Tylenol.  She does not have leukocytosis but white blood cell has trended up.  Lumbar x-ray with moderate to severe multilevel degenerative disease.  Given her UTI and acute on chronic back pain, will obtain CT to exclude pyelonephritis.  At the same time, we will also obtain CT chest to exclude aspiration pneumonia and better characterization of pulmonary nodules.  Will obtain blood culture to exclude bacteremia although my suspicion is low here.  When I went by the patient's room to update patient and patient's daughter with the plan above, patient was sitting on bed eating lunch.  Reports feeling better from back pain standpoint after a dose of Tylenol and oxycodone.  She looks calmer and brighter.

## 2022-09-25 NOTE — TOC Transition Note (Addendum)
Transition of Care [facility]) - CM/SW Discharge Note   Patient Details  Name: Kaitlin [name] MRN: [id number] Date of Birth: 09-16-1927  Transition of Care [facility]) CM/SW Contact:  Lennart Pall, LCSW Phone Number: 09/25/2022, 10:13 AM   Clinical Narrative:     Met with pt and son who are in agreement with recommendation for HHPT follow up and RW - no agency preference.  HHPT arranged with [facility] and RW delivered to room via [place].  No further TOC needs.  Final next level of care: [place] Barriers to Discharge: Barriers Resolved   Patient Goals and CMS Choice Patient states their goals for this hospitalization and ongoing recovery are:: return home      Discharge Placement                       Discharge Plan and Services                DME Arranged: [place] rolling DME Agency: [employer] Date DME Agency Contacted: 09/23/22   Representative spoke with at DME Agency: [place]: PT [place]: [place] ([place]) Date [place]: 09/23/22   Representative spoke with at [place]: [place] ([place]) Interventions     Readmission Risk Interventions    09/25/2022   10:02 AM  Readmission Risk Prevention Plan  Post Dischage Appt Complete  Medication Screening Complete  Transportation Screening Complete

## 2022-09-25 NOTE — Progress Notes (Signed)
PROGRESS NOTE  Kaitlin [name] KXF:[id number] DOB: [birth year]   PCP: [name], MD  Patient is from: Home.  DOA: 09/18/2022 LOS: 6  Chief complaints Chief Complaint  Patient presents with   Rectal Bleeding     Brief Narrative / Interim history: 88 year old F with PMH of persistent A-fib on Eliquis, CVA, LBBB, anxiety, esophageal stricture s/p dilation and diverticulosis presenting with acute rectal bleed that started the day of presentation, and admitted for acute blood loss anemia due to rectal bleed.  Hgb 10.6 but dropped further to 7.1.  Received Kcentra in ED.  She was transfused 3 units.  CT angio without extravasation but diverticulosis.  [facility] consulted.  EGD showed tortuous esophagus otherwise normal. Flexible sig blood in the rectum and in the sigmoid colon, severe diverticulosis in sigmoid colon and narrowing of colon in association with the diverticular opening.  Bleeding seems to have subsided.  Transfused additional 1 unit on 12/10.  H&H stable.  Tolerating soft diet.  GI signed off.  Patient has urinary tract infection.  Completed antibiotic course with IV ceftriaxone and p.o. cefadroxil.  Main issue is acute on chronic back pain and physical deconditioning.   Therapy recommended home health and DME.  Subjective: Seen and examined earlier this morning.  Daughter and son at bedside.  Patient reports having terrible night.  Not able to elaborate what happened.  It seems she had severe back pain for which she was given hydrocodone x 2.  She also received Ativan 0.25 mg.  She reports severe back pain across mid to lower back.  Denies radiation into her legs.  Denies nausea or abdominal pain.  Per daughter, she had a small bowel movement yesterday after MiraLAX.  No report of blood in stool.  Patient's son concerned about "fever" although his temperatures has been within normal.   Objective: Vitals:   09/24/22 0636 09/24/22 1318 09/24/22 2022 09/25/22 0616  BP:  (!) 107/49 116/66 (!) 143/66 123/72  Pulse: 87 62 80 75  Resp: '16 17 18 16  '$ Temp: (!) 97.5 F (36.4 C) (!) 97.5 F (36.4 C) 98.7 F (37.1 C) 97.9 F (36.6 C)  TempSrc: Oral Oral Oral Oral  SpO2: 99% 94% 100% 100%  Weight:      Height:        Examination:  GENERAL: Appears frail, confused and anxious. HEENT: MMM.  Vision and hearing grossly intact.  NECK: Supple.  No apparent JVD.  RESP:  No IWOB.  Fair aeration bilaterally. CVS: Irregular rhythm.  Normal rate.  Heart sounds normal.  ABD/GI/GU: BS+. Abd soft, NTND.  MSK/EXT:  Moves extremities.  Mild diffuse tenderness across lower back.  Significant muscle mass and subcu fat loss. SKIN: no apparent skin lesion or wound NEURO: Awake and alert. Oriented fairly.  No apparent focal neuro deficit.  Resting tremor more pronounced in RUE. PSYCH: Appears confused and anxious.  Procedures:  None  Microbiology summarized: MRSA PCR screen nonreactive. Urine culture with pansensitive Klebsiella oxytoca and E. coli  Assessment and plan: Principal Problem:   Acute GI bleeding Active Problems:   Persistent atrial fibrillation (HCC)   ANXIETY DEPRESSION   Diverticulosis of colon with hemorrhage   Chronic back pain   Osteoarthritis   Tachy-brady syndrome (HCC)   History of stroke   Multiple lung nodules on CT   Acute blood loss anemia   Underweight   Intractable low back pain  Acute blood loss anemia due to rectal bleed: Likely diverticular.  CT  did not show extravasation.  Received Kcentra in ED.  EGD negative except for torturous esophagus.  Flex sig with blood in rectum and sigmoid colon and  severe sigmoid diverticulosis.  Bleeding seems to have subsided.  H&H stable after a total of 4 units. Recent Labs    09/19/22 0805 09/19/22 1210 09/19/22 1715 09/20/22 0302 09/21/22 0235 09/22/22 0428 09/22/22 2007 09/23/22 0405 09/24/22 0427 09/25/22 0456  HGB 11.7* 10.1* 8.7* 8.0* 7.8* 7.6* 9.7* 9.5* 9.4* 9.1*  -Cardiology  recommended holding Eliquis until outpatient follow-up in February -Upgraded to regular diet -GI signed off.  Persistent A-fib with mild RVR: Not on rate or rhythm control.  On low-dose Eliquis POA.  CHA2DS2-VASc score 7 but she has significant risk for bleeding.  Cardiology recommended holding Eliquis until outpatient follow-up in February. -IV metoprolol 2.5 mg as needed for sustained HR > 120. -Discussed cardiology recommendation with patient and family at bedside.   Multiple lung nodules on CT:  groundglass nodules in the right lower lobe, largest measuring 8 mm noted on CT imaging.  Seems chronic per his CT report from 2020. -Follow-up CT in 12 months recommended.   Anxiety and depression: On low-dose Xanax as needed at home -Home Xanax 0.25 mg every 8 hours as needed  Acute urinary retention: Had about 730 cc urine output after Foley insertion.  Passed voiding trial.  UTI due to pansensitive Klebsiella oxytoca and E. coli.  Symptoms improved. -Completed 5 days course with IV ceftriaxone for 4 days and p.o. cefadroxil for 1 day  Acute on chronic back pain: Intractable.  Pain and tenderness across mid and lower back.  No radiation to the legs or back.  No urine retention or bowel accidents.  No focal neurodeficits. -Scheduled Tylenol with as needed oxycodone and fentanyl to minimize confusion/delirium and optimize better pain control -MiraLAX for constipation.  Would avoid senna given diverticular bleed. -Will obtain lumbar films -OOB/PT/OT  Possible dysphagia/history of esophageal stricture s/p dilation: Noted to have choking while eating soft diet. -SLP recommended dysphagia 3 diet but patient and family would like to try regular diet so she can have vegetables -Aspiration precaution  Abdominal pain/nausea: This seems to have subsided.  Workup including KUB, LFT, lipase and lactic acid unrevealing.  -Discontinue Metamucil given temporal correlation -MiraLAX daily  Constipation:  Could be due to opiate.  Seems to have daily bowel movements but very small. -Continue MiraLAX -Would avoid senna given diverticular bleed.  History of CVA: Stable. History of LBBB: Stable  Underweight/unintentional weight loss: Reports about 9 pounds weight loss in the last 8 months. Body mass index is 14.33 kg/m. -Consult dietitian once she started taking p.o.          DVT prophylaxis:  SCDs Start: 09/19/22 0045  Code Status: Full code Family Communication: Updated patient's son and daughter at bedside.  Also updated patient's granddaughter over the phone Level of care: Med-Surg Status is: Inpatient Remains inpatient appropriate because: Intractable acute on chronic back pain   Final disposition: Home with home health Consultants:  GI Cardiology  Sch Meds:  Scheduled Meds:  acetaminophen  650 mg Oral Q6H WA   Chlorhexidine Gluconate Cloth  6 each Topical Daily   diclofenac Sodium  2 g Topical QID   lidocaine  1 patch Transdermal Q24H   polyethylene glycol  17 g Oral Daily   sodium chloride flush  3 mL Intravenous Q12H   Continuous Infusions:   PRN Meds:.ALPRAZolam, metoprolol tartrate, ondansetron **OR** ondansetron (ZOFRAN) IV, mouth  rinse, oxyCODONE  Antimicrobials: Anti-infectives (From admission, onward)    Start     Dose/Rate Route Frequency Ordered Stop   09/25/22 0800  cefadroxil (DURICEF) capsule 500 mg        500 mg Oral Once 09/24/22 1409 09/25/22 0753   09/24/22 0000  cefadroxil (DURICEF) 500 MG capsule  Status:  Discontinued        500 mg Oral 2 times daily 09/24/22 0744 09/24/22    09/21/22 1600  cefTRIAXone (ROCEPHIN) 1 g in sodium chloride 0.9 % 100 mL IVPB  Status:  Discontinued        1 g 200 mL/hr over 30 Minutes Intravenous Daily 09/21/22 1531 09/24/22 1409        I have personally reviewed the following labs and images: CBC: Recent Labs  Lab 09/18/22 2113 09/18/22 2235 09/21/22 0235 09/22/22 0428 09/22/22 2007 09/23/22 0405  09/24/22 0427 09/25/22 0456  WBC 6.8   < > 7.7 4.7  --  3.6* 5.1 9.1  NEUTROABS 5.3  --   --   --   --   --   --   --   HGB 10.6*   < > 7.8* 7.6* 9.7* 9.5* 9.4* 9.1*  HCT 33.8*   < > 24.6* 24.0* 29.3* 29.6* 29.6* 28.7*  MCV 96.3   < > 96.9 97.2  --  94.0 95.2 95.7  PLT 172   < > 126* 126*  --  143* 175 198   < > = values in this interval not displayed.   BMP &GFR Recent Labs  Lab 09/20/22 0302 09/21/22 0235 09/22/22 0428 09/23/22 0405 09/25/22 0456  NA 137 138 142 141 136  K 3.6 3.9 3.7 3.8 3.9  CL 109 108 110 108 102  CO2 '25 25 28 27 28  '$ GLUCOSE 89 82 81 87 97  BUN '13 10 8 9 9  '$ CREATININE 0.60 0.59 0.52 0.58 0.63  CALCIUM 7.5* 7.8* 7.8* 8.1* 8.0*  MG 1.8 1.6* 2.0 1.9 1.8  PHOS 3.4 2.7 2.8 3.8  --    Estimated Creatinine Clearance: 27.1 mL/min (by C-G formula based on SCr of 0.63 mg/dL). Liver & Pancreas: Recent Labs  Lab 09/18/22 2227 09/19/22 0805 09/20/22 0302 09/21/22 0235 09/22/22 0428 09/23/22 0405 09/24/22 1050 09/25/22 0456  AST 24 17  --   --   --   --  54* 55*  ALT 8 12  --   --   --   --  29 38  ALKPHOS 39 37*  --   --   --   --  50 44  BILITOT 0.7 1.3*  --   --   --   --  0.5 0.5  PROT 4.9* 5.0*  --   --   --   --  5.9* 5.2*  ALBUMIN 2.8* 3.0*   < > 2.5* 2.3* 2.5* 3.1* 2.5*   < > = values in this interval not displayed.   No results for input(s): "LIPASE", "AMYLASE" in the last 168 hours. No results for input(s): "AMMONIA" in the last 168 hours. Diabetic: No results for input(s): "HGBA1C" in the last 72 hours. No results for input(s): "GLUCAP" in the last 168 hours. Cardiac Enzymes: Recent Labs  Lab 09/24/22 1219 09/25/22 0456  CKTOTAL 49 32*   No results for input(s): "PROBNP" in the last 8760 hours. Coagulation Profile: Recent Labs  Lab 09/18/22 2113 09/19/22 0805  INR 1.4* 1.4*   Thyroid Function Tests: No results for input(s): "TSH", "T4TOTAL", "  FREET4", "T3FREE", "THYROIDAB" in the last 72 hours. Lipid Profile: No results for  input(s): "CHOL", "HDL", "LDLCALC", "TRIG", "CHOLHDL", "LDLDIRECT" in the last 72 hours. Anemia Panel: Recent Labs    09/23/22 0405  VITAMINB12 1,908*  FOLATE 18.2  FERRITIN 36  TIBC 234*  IRON 28   Urine analysis:    Component Value Date/Time   COLORURINE YELLOW 09/21/2022 1156   APPEARANCEUR CLOUDY (A) 09/21/2022 1156   LABSPEC 1.012 09/21/2022 1156   PHURINE 5.0 09/21/2022 1156   GLUCOSEU NEGATIVE 09/21/2022 1156   HGBUR MODERATE (A) 09/21/2022 1156   BILIRUBINUR NEGATIVE 09/21/2022 1156   KETONESUR NEGATIVE 09/21/2022 1156   PROTEINUR NEGATIVE 09/21/2022 1156   UROBILINOGEN 0.2 01/25/2014 1657   NITRITE POSITIVE (A) 09/21/2022 1156   LEUKOCYTESUR LARGE (A) 09/21/2022 1156   Sepsis Labs: Invalid input(s): "PROCALCITONIN", "LACTICIDVEN"  Microbiology: Recent Results (from the past 240 hour(s))  MRSA Next Gen by PCR, Nasal     Status: None   Collection Time: 09/19/22  3:45 AM   Specimen: Nasal Mucosa; Nasal Swab  Result Value Ref Range Status   MRSA by PCR Next Gen NOT DETECTED NOT DETECTED Final    Comment: (NOTE) The GeneXpert MRSA Assay (FDA approved for NASAL specimens only), is one component of a comprehensive MRSA colonization surveillance program. It is not intended to diagnose MRSA infection nor to guide or monitor treatment for MRSA infections. Test performance is not FDA approved in patients less than 80 years old. Performed at [facility], [place] [address], [place], [place] [address]   Urine Culture     Status: Abnormal   Collection Time: 09/21/22 11:56 AM   Specimen: Urine, Catheterized  Result Value Ref Range Status   Specimen Description   Final    URINE, CATHETERIZED Performed at [facility] [address] [place], [place] [address]    Special Requests   Final    NONE Performed at [facility], [place] [address] [place], [place] [address]    Culture (A)  Final    >=100,000 COLONIES/mL  KLEBSIELLA OXYTOCA >=100,000 COLONIES/mL ESCHERICHIA COLI    Report Status 09/24/2022 FINAL  Final   Organism ID, Bacteria KLEBSIELLA OXYTOCA (A)  Final   Organism ID, Bacteria ESCHERICHIA COLI (A)  Final      Susceptibility   Escherichia coli - MIC*    AMPICILLIN 4 SENSITIVE Sensitive     CEFAZOLIN <=4 SENSITIVE Sensitive     CEFEPIME <=0.12 SENSITIVE Sensitive     CEFTRIAXONE <=0.25 SENSITIVE Sensitive     CIPROFLOXACIN <=0.25 SENSITIVE Sensitive     GENTAMICIN <=1 SENSITIVE Sensitive     IMIPENEM <=0.25 SENSITIVE Sensitive     NITROFURANTOIN <=16 SENSITIVE Sensitive     TRIMETH/SULFA <=20 SENSITIVE Sensitive     AMPICILLIN/SULBACTAM <=2 SENSITIVE Sensitive     PIP/TAZO <=4 SENSITIVE Sensitive     * >=100,000 COLONIES/mL ESCHERICHIA COLI   Klebsiella oxytoca - MIC*    AMPICILLIN >=32 RESISTANT Resistant     CEFAZOLIN <=4 SENSITIVE Sensitive     CEFEPIME <=0.12 SENSITIVE Sensitive     CEFTRIAXONE <=0.25 SENSITIVE Sensitive     CIPROFLOXACIN <=0.25 SENSITIVE Sensitive     GENTAMICIN <=1 SENSITIVE Sensitive     IMIPENEM <=0.25 SENSITIVE Sensitive     NITROFURANTOIN 32 SENSITIVE Sensitive     TRIMETH/SULFA <=20 SENSITIVE Sensitive     AMPICILLIN/SULBACTAM 8 SENSITIVE Sensitive     PIP/TAZO <=4 SENSITIVE Sensitive     * >=100,000  COLONIES/mL KLEBSIELLA OXYTOCA    Radiology Studies: No results found.    Sonda Coppens T. [place]  If 7PM-7AM, please contact night-coverage [email or web address] 09/25/2022, 11:01 AM

## 2022-09-25 NOTE — Progress Notes (Signed)
Occupational Therapy Treatment Patient Details Name: Marisal Swarey MRN: [id number] DOB: 1926-10-23 Today's Date: 09/25/2022   History of present illness Patient is a 88 year old female who presented on 09/19/2022 with  rectal bleeding with bleed suspected from sigmoid diverticular. PMH: a fib, CVA, LBBB, anxiety, esophageal stricture s/p dilation.   OT comments  Patient was agreeable to therapy participation, though she reported general feelings of weakness, as well as chronic back pain from arthritis. She required min guard assist for toileting at bathroom level, as well as for hand washing in standing at the sink. She was assisted to the bedside chair at the end of the session, where she resumed eating her dinner. She is making gradual functional progress and will continue to benefit from further OT services to maximize her independence with self-care tasks.    Recommendations for follow up therapy are one component of a multi-disciplinary discharge planning process, led by the attending physician.  Recommendations may be updated based on patient status, additional functional criteria and insurance authorization.    Follow Up Recommendations  Home health OT     Assistance Recommended at Discharge Intermittent Supervision/Assistance  Patient can return home with the following  Assistance with cooking/housework;Direct supervision/assist for medications management;Direct supervision/assist for financial management;Help with stairs or ramp for entrance;Assist for transportation;A little help with bathing/dressing/bathroom   Equipment Recommendations  None recommended by OT       Precautions / Restrictions Restrictions Weight Bearing Restrictions: No       Mobility Bed Mobility Overal bed mobility: Needs Assistance Bed Mobility: Supine to Sit     Supine to sit: HOB elevated, Min assist          Transfers Overall transfer level: Needs assistance Equipment used: Rolling  walker (2 wheels) Transfers: Sit to/from Stand Sit to Stand: Min guard                     ADL either performed or assessed with clinical judgement   ADL Overall ADL's : Needs assistance/impaired Eating/Feeding: Set up;Sitting Eating/Feeding Details (indicate cue type and reason): She initiated self-feeding seated in the bedside chair Grooming: Min guard;Supervision/safety Grooming Details (indicate cue type and reason): She performed hand washing in standing at the sink, requiring SBA or occasional light min guard assist for steadying, as well as min verbal cues for posture                 Toilet Transfer: Min guard;Ambulation;Regular Toilet;Cueing for safety;Rolling walker (2 wheels) Toilet Transfer Details (indicate cue type and reason): Pt required cues for not pulling on walker with BUE when attempting to stand; instruction provided on best walker placement during toilet transfer Petroleum and Hygiene: Min guard;Sit to/from stand Toileting - Clothing Manipulation Details (indicate cue type and reason): Pt performed seated hygiene with SBA after urinating only. Light steadying assist required in standing, as clothing management was performed              Cognition Arousal/Alertness: Awake/alert Behavior During Therapy: WFL for tasks assessed/performed Overall Cognitive Status: Within Functional Limits for tasks assessed                        Pertinent Vitals/ Pain       Pain Assessment Pain Assessment: Faces Pain Score: 4  Pain Location: chronic back pain Pain Intervention(s): Limited activity within patient's tolerance, Repositioned   Frequency  Min 2X/week  Progress Toward Goals  OT Goals(current goals can now be found in the care plan section)  Progress towards OT goals: Progressing toward goals  Acute Rehab OT Goals Patient Stated Goal: to get better and return to normal activities OT Goal Formulation:  With patient/family Time For Goal Achievement: 10/06/22 Potential to Achieve Goals: Good  Plan Discharge plan remains appropriate       AM-PAC OT "6 Clicks" Daily Activity     Outcome Measure   Help from another person eating meals?: None Help from another person taking care of personal grooming?: A Little Help from another person toileting, which includes using toliet, bedpan, or urinal?: A Little Help from another person bathing (including washing, rinsing, drying)?: A Little Help from another person to put on and taking off regular upper body clothing?: None Help from another person to put on and taking off regular lower body clothing?: A Little 6 Click Score: 20    End of Session Equipment Utilized During Treatment: Gait belt;Rolling walker (2 wheels)  OT Visit Diagnosis: Unsteadiness on feet (R26.81);Muscle weakness (generalized) (M62.81)   Activity Tolerance Patient tolerated treatment well   Patient Left in chair;with call bell/phone within reach;with family/visitor present   Nurse Communication Mobility status        Time: 1710-1725 OT Time Calculation (min): 15 min  Charges: OT General Charges $OT Visit: 1 Visit OT Treatments $Self Care/Home Management : 8-22 mins   Leota Sauers, OTR/L 09/25/2022, 5:48 PM

## 2022-09-26 DIAGNOSIS — N136 Pyonephrosis: Secondary | ICD-10-CM

## 2022-09-26 DIAGNOSIS — Z7189 Other specified counseling: Secondary | ICD-10-CM

## 2022-09-26 DIAGNOSIS — K922 Gastrointestinal hemorrhage, unspecified: Secondary | ICD-10-CM | POA: Diagnosis not present

## 2022-09-26 LAB — CBC
HCT: 28.9 % — ABNORMAL LOW (ref 36.0–46.0)
Hemoglobin: 8.9 g/dL — ABNORMAL LOW (ref 12.0–15.0)
MCH: 29.9 pg (ref 26.0–34.0)
MCHC: 30.8 g/dL (ref 30.0–36.0)
MCV: 97 fL (ref 80.0–100.0)
Platelets: 199 10*3/uL (ref 150–400)
RBC: 2.98 MIL/uL — ABNORMAL LOW (ref 3.87–5.11)
RDW: 15 % (ref 11.5–15.5)
WBC: 5.7 10*3/uL (ref 4.0–10.5)
nRBC: 0 % (ref 0.0–0.2)

## 2022-09-26 LAB — COMPREHENSIVE METABOLIC PANEL
ALT: 34 U/L (ref 0–44)
AST: 35 U/L (ref 15–41)
Albumin: 2.4 g/dL — ABNORMAL LOW (ref 3.5–5.0)
Alkaline Phosphatase: 41 U/L (ref 38–126)
Anion gap: 7 (ref 5–15)
BUN: 13 mg/dL (ref 8–23)
CO2: 28 mmol/L (ref 22–32)
Calcium: 8.1 mg/dL — ABNORMAL LOW (ref 8.9–10.3)
Chloride: 102 mmol/L (ref 98–111)
Creatinine, Ser: 0.79 mg/dL (ref 0.44–1.00)
GFR, Estimated: >60 mL/min (ref >60)
Glucose, Bld: 86 mg/dL (ref 70–99)
Potassium: 4.2 mmol/L (ref 3.5–5.1)
Sodium: 137 mmol/L (ref 135–145)
Total Bilirubin: 0.6 mg/dL (ref 0.3–1.2)
Total Protein: 4.7 g/dL — ABNORMAL LOW (ref 6.5–8.1)

## 2022-09-26 LAB — MAGNESIUM: Magnesium: 1.9 mg/dL (ref 1.7–2.4)

## 2022-09-26 LAB — PHOSPHORUS: Phosphorus: 4 mg/dL (ref 2.5–4.6)

## 2022-09-26 MED ORDER — SODIUM CHLORIDE 0.9 % IV SOLN
2.0000 g | INTRAVENOUS | Status: DC
  Administered 2022-09-26 – 2022-09-29 (×4): 2 g via INTRAVENOUS
  Filled 2022-09-26 (×4): qty 20

## 2022-09-26 MED ORDER — POLYETHYLENE GLYCOL 3350 17 G PO PACK: 17.0000 g | PACK | Freq: Two times a day (BID) | ORAL | Status: DC | PRN

## 2022-09-26 NOTE — Progress Notes (Signed)
Physical Therapy Treatment Patient Details Name: Kaitlin [name] MRN: [id number] DOB: Apr 17, 1927 Today's Date: 09/26/2022   History of Present Illness Patient is a 88 year old female who presented on 09/19/2022 with  rectal bleeding with bleed suspected from sigmoid diverticular. PMH: a fib, CVA, LBBB, anxiety, esophageal stricture s/p dilation.    PT Comments    Patient agreeable to mobilize with therapy and sitting up in bed with son at bedside at start of session. Pt able to raise trunk and sit upright to EOB with min guard for safety. Pt using bil UE to power up to stand with min guard from EOB and overall steady with gait, no LOB. Cues needed to improve posture throughout, cue to stand closer to RW was most effective. EOS completed LE exercises seated in recliner and pt sitting up with meal set up on tray and son at bedside. Will continue to progress as able.    Recommendations for follow up therapy are one component of a multi-disciplinary discharge planning process, led by the attending physician.  Recommendations may be updated based on patient status, additional functional criteria and insurance authorization.  Follow Up Recommendations  Home health PT     Assistance Recommended at Discharge Intermittent Supervision/Assistance  Patient can return home with the following Assist for transportation;Assistance with cooking/housework;Help with stairs or ramp for entrance   Equipment Recommendations  BSC/3in1 (son planning to find RW)    Recommendations for Other Services       Precautions / Restrictions Precautions Precautions: Fall Restrictions Weight Bearing Restrictions: No     Mobility  Bed Mobility Overal bed mobility: Needs Assistance Bed Mobility: Supine to Sit     Supine to sit: Min guard, HOB elevated     General bed mobility comments: Patient requires min guard for safety with supine>sit, pt able to use UE's to scoot to EOB.    Transfers Overall  transfer level: Needs assistance Equipment used: Rolling walker (2 wheels) Transfers: Sit to/from Stand Sit to Stand: Min guard           General transfer comment: guarding for safety with sit<>stand, pt able to power up and steady balance with RW for support.    Ambulation/Gait Ambulation/Gait assistance: Min guard Gait Distance (Feet): 200 Feet Assistive device: Rolling walker (2 wheels) Gait Pattern/deviations: Step-through pattern, Trunk flexed, Knee flexed in stance - right, Knee flexed in stance - left, Decreased stride length Gait velocity: decr     General Gait Details: min guard for safety. no LOB throughout, trunk flexed and cues to raise chest up and step closer to RW. distance shortened to avoid over-exertion.   Stairs             Wheelchair Mobility    Modified Rankin (Stroke Patients Only)       Balance Overall balance assessment: Needs assistance Sitting-balance support: Bilateral upper extremity supported, Feet supported Sitting balance-Leahy Scale: Good     Standing balance support: Reliant on assistive device for balance, Bilateral upper extremity supported, During functional activity Standing balance-Leahy Scale: Fair                              Cognition Arousal/Alertness: Awake/alert Behavior During Therapy: WFL for tasks assessed/performed Overall Cognitive Status: Within Functional Limits for tasks assessed  Exercises General Exercises - Lower Extremity Ankle Circles/Pumps: AROM, Both, 10 reps Long Arc Quad: AROM, Both, 10 reps    General Comments        Pertinent Vitals/Pain Pain Assessment Pain Assessment: No/denies pain    Home Living                          Prior Function            PT Goals (current goals can now be found in the care plan section) Acute Rehab PT Goals Patient Stated Goal: back to independent lifestyle PT Goal  Formulation: With patient Time For Goal Achievement: 10/06/22 Potential to Achieve Goals: Good Progress towards PT goals: Progressing toward goals    Frequency    Min 3X/week      PT Plan Current plan remains appropriate    Co-evaluation              AM-PAC PT "6 Clicks" Mobility   Outcome Measure  Help needed turning from your back to your side while in a flat bed without using bedrails?: A Little Help needed moving from lying on your back to sitting on the side of a flat bed without using bedrails?: A Little Help needed moving to and from a bed to a chair (including a wheelchair)?: A Little Help needed standing up from a chair using your arms (e.g., wheelchair or bedside chair)?: A Little Help needed to walk in hospital room?: A Little Help needed climbing 3-5 steps with a railing? : A Little 6 Click Score: 18    End of Session Equipment Utilized During Treatment: Gait belt Activity Tolerance: Patient tolerated treatment well Patient left: in chair;with call bell/phone within reach;with family/visitor present Nurse Communication: Mobility status PT Visit Diagnosis: Other abnormalities of gait and mobility (R26.89);Difficulty in walking, not elsewhere classified (R26.2)     Time: 9935-7017 PT Time Calculation (min) (ACUTE ONLY): 17 min  Charges:  $Gait Training: 8-22 mins                     Verner Mould, DPT Acute Rehabilitation Services Office [phone]  09/26/22 3:29 PM

## 2022-09-26 NOTE — Care Management Important Message (Signed)
Important Message  Patient Details IM Letter placed in Patient's room. Name: Kaitlin [name] MRN: [id number] Date of Birth: March 30, 1927   Medicare Important Message Given:  Yes     Kerin Salen 09/26/2022, 1:53 PM

## 2022-09-26 NOTE — Progress Notes (Signed)
PROGRESS NOTE  Kaitlin [name] KGU:[id number] DOB: [birth year]   PCP: [name], MD  Patient is from: Home.  DOA: 09/18/2022 LOS: 7  Chief complaints Chief Complaint  Patient presents with   Rectal Bleeding     Brief Narrative / Interim history: 88 year old F with PMH of persistent A-fib on Eliquis, CVA, LBBB, anxiety, esophageal stricture s/p dilation and diverticulosis presenting with acute rectal bleed that started the day of presentation, and admitted for acute blood loss anemia due to rectal bleed.  Hgb 10.6 but dropped further to 7.1.  Received Kcentra in ED.  She was transfused 3 units.  CT angio without extravasation but diverticulosis.  [facility] consulted.  EGD showed tortuous esophagus otherwise normal. Flexible sig blood in the rectum and in the sigmoid colon, severe diverticulosis in sigmoid colon and narrowing of colon in association with the diverticular opening.  Bleeding seems to have subsided.  Transfused additional 1 unit on 12/10.  H&H stable.  Tolerating soft diet.  GI signed off.  Patient has urinary tract infection.  Urine culture with pansensitive Klebsiella oxytoca and E. Coli.  She received IV ceftriaxone for 4 days followed by p.o. cefadroxil for 1 day.  However, she had acute on chronic back pain, right flank pain and mild temp. lumbar x-ray showed moderate to severe multilevel DDD.  CT chest/abdomen/pelvis with contrast showed possible right nephric abscesses versus infarction and slightly enlarged RUE nodule measuring 16 x 9 mm (from 7 x 8 mm in 2020).  Antibiotics broadened to IV Zosyn.  Infectious disease consulted.   Subjective: Seen and examined earlier this morning.  No major events overnight of this morning.  Reports having good night.  Pain improved with new regimen.  Feels weak but better today.  Patient's daughter and son at bedside.   Objective: Vitals:   09/25/22 2044 09/25/22 2115 09/26/22 0606 09/26/22 1415  BP: (!) 104/47 (!) 92/48  124/63 (!) 130/91  Pulse: (!) 55 66 71 (!) 48  Resp:  '18 16 16  '$ Temp:  97.9 F (36.6 C) 97.6 F (36.4 C)   TempSrc:  Oral Oral   SpO2:  98% 98% 100%  Weight:      Height:        Examination:  GENERAL: Appears frail.  Nontoxic. HEENT: MMM.  Vision and hearing grossly intact.  NECK: Supple.  No apparent JVD.  RESP:  No IWOB.  Fair aeration bilaterally. CVS:  RRR. Heart sounds normal.  ABD/GI/GU: BS+. Abd soft, NTND.  MSK/EXT:  Moves extremities.  Significant muscle mass and subcu fat loss. SKIN: no apparent skin lesion or wound NEURO: Awake and alert. Oriented appropriately.  Mild RUE tremor at rest.  No apparent focal neuro deficit. PSYCH: Appears anxious  Procedures:  None  Microbiology summarized: 12/7-MRSA PCR screen nonreactive. 12/9-urine culture with pansensitive Klebsiella oxytoca and E. Coli 12/13-blood cultures NGTD   Assessment and plan: Principal Problem:   Acute GI bleeding Active Problems:   Persistent atrial fibrillation (HCC)   ANXIETY DEPRESSION   Diverticulosis of colon with hemorrhage   Chronic back pain   Osteoarthritis   Tachy-brady syndrome (HCC)   History of stroke   Multiple lung nodules on CT   Acute blood loss anemia   Underweight   Intractable low back pain  Acute right pyonephritis: Patient was treated for UTI with appropriate antibiotics for 5 days as below.  Despite that, she had acute on chronic back pain, right flank pain and mild temp to 99.34F.  No leukocytosis but WBC went up to 9.1.  CT concerning for right nephric abscesses versus infarction.  Blood cultures NGTD. -Antibiotics broadened to IV Zosyn -ID consulted for guidance  UTI due to pansensitive Klebsiella oxytoca and E. coli.  Received IV ceftriaxone for 4 days followed by p.o. cefadroxil for 1 day -IV Zosyn as above for pyonephritis  Acute blood loss anemia due to rectal bleed: Likely diverticular.  CT did not show extravasation.  Received Kcentra in ED.  EGD negative  except for torturous esophagus.  Flex sig with blood in rectum and sigmoid colon and  severe sigmoid diverticulosis.  Bleeding seems to have subsided.  Received 4 units. Recent Labs    09/19/22 1210 09/19/22 1715 09/20/22 0302 09/21/22 0235 09/22/22 0428 09/22/22 2007 09/23/22 0405 09/24/22 0427 09/25/22 0456 09/26/22 0509  HGB 10.1* 8.7* 8.0* 7.8* 7.6* 9.7* 9.5* 9.4* 9.1* 8.9*  -Cardiology recommended holding Eliquis until outpatient follow-up in February -Upgraded to regular diet at the patient's and family request although SLP recommended dysphagia 3 diet -GI signed off.  Persistent A-fib with mild RVR: Not on rate or rhythm control.  On low-dose Eliquis POA.  CHA2DS2-VASc score 7 but she has significant risk for bleeding.  Cardiology recommended holding Eliquis until outpatient follow-up in February. -IV metoprolol 2.5 mg as needed for sustained HR > 120. -Discussed cardiology recommendation with patient and family at bedside.   Right upper lobe nodule: CT chest on 12/13 showed RUL solid density measuring 16 x 9 mm (from 7 x 8 mm in 2020).  Radiology recommended PET scan outpatient.  Discussed with patient's daughter, granddaughter and son.  Given age, frailty and slow progression, family not interested in pursuing evaluation or treatment for this.   Anxiety and depression: On low-dose Xanax as needed at home -Home Xanax 0.25 mg every 8 hours as needed  Acute urinary retention: Had about 730 cc urine output after Foley insertion.  Passed voiding trial.  Intractable acute on chronic back pain: No radiculopathy.  Patient is on [place] for chronic back pain.  Lumbar film shows moderate to severe multilevel DDD.  CT concerning for right kidney infection. -Pain is better controlled with scheduled Tylenol and as needed oxycodone. -MiraLAX for constipation.  Would avoid senna given diverticular bleed. -Manage kidney infection as above -OOB/PT/OT  Possible dysphagia/history of esophageal  stricture s/p dilation: Noted to have choking while eating soft diet. -SLP recommended dysphagia 3 diet but patient and family would like to try regular diet so she can have vegetables -Aspiration precaution  Abdominal pain/nausea: Seems to have resolved.  Constipation: Could be due to opiate.  Seems to have resolved. -Continue MiraLAX -Would avoid senna given diverticular bleed.  Goal of care counseling: Advanced age with comorbidity as above.  She is still full code.  Patient's son is familiar with CPR.  He understand that CPR is not to his mother's best interest given how frail she is.  However, he requested to hold off CODE STATUS discussion with patient and palliative medicine consult until he discusses this with his mother and his siblings, and let us know the morning of 12/15.   History of CVA: Stable. History of LBBB: Stable  Underweight/unintentional weight loss: Reports about 9 pounds weight loss in the last 8 months. Body mass index is 14.33 kg/m. Nutrition Problem: Increased nutrient needs Etiology: acute illness Signs/Symptoms: estimated needs Interventions: Ensure Enlive (each supplement provides 350kcal and 20 grams of protein), MVI   DVT prophylaxis:  SCDs Start: 09/19/22 0045  Code Status: Full code Family Communication: Updated patient's son and daughter at bedside. Level of care: Med-Surg Status is: Inpatient Remains inpatient appropriate because: Acute right pyonephritis   Final disposition: Home with home health Consultants:  GI Cardiology Infectious disease  Sch Meds:  Scheduled Meds:  acetaminophen  650 mg Oral Q6H WA   Chlorhexidine Gluconate Cloth  6 each Topical Daily   feeding supplement  237 mL Oral BID BM   lidocaine  1 patch Transdermal Q24H   multivitamin with minerals  1 tablet Oral Daily   sodium chloride flush  3 mL Intravenous Q12H   Continuous Infusions:  piperacillin-tazobactam 3.375 g (09/26/22 1215)    PRN Meds:.ALPRAZolam,  metoprolol tartrate, ondansetron **OR** ondansetron (ZOFRAN) IV, mouth rinse, oxyCODONE, polyethylene glycol  Antimicrobials: Anti-infectives (From admission, onward)    Start     Dose/Rate Route Frequency Ordered Stop   09/25/22 2000  cefadroxil (DURICEF) capsule 500 mg  Status:  Discontinued        500 mg Oral 2 times daily 09/25/22 1220 09/25/22 1845   09/25/22 2000  piperacillin-tazobactam (ZOSYN) IVPB 3.375 g        3.375 g 12.5 mL/hr over 240 Minutes Intravenous Every 8 hours 09/25/22 1901     09/25/22 0800  cefadroxil (DURICEF) capsule 500 mg        500 mg Oral Once 09/24/22 1409 09/25/22 0753   09/24/22 0000  cefadroxil (DURICEF) 500 MG capsule  Status:  Discontinued        500 mg Oral 2 times daily 09/24/22 0744 09/24/22    09/21/22 1600  cefTRIAXone (ROCEPHIN) 1 g in sodium chloride 0.9 % 100 mL IVPB  Status:  Discontinued        1 g 200 mL/hr over 30 Minutes Intravenous Daily 09/21/22 1531 09/24/22 1409        I have personally reviewed the following labs and images: CBC: Recent Labs  Lab 09/22/22 0428 09/22/22 2007 09/23/22 0405 09/24/22 0427 09/25/22 0456 09/26/22 0509  WBC 4.7  --  3.6* 5.1 9.1 5.7  HGB 7.6* 9.7* 9.5* 9.4* 9.1* 8.9*  HCT 24.0* 29.3* 29.6* 29.6* 28.7* 28.9*  MCV 97.2  --  94.0 95.2 95.7 97.0  PLT 126*  --  143* 175 198 199   BMP &GFR Recent Labs  Lab 09/20/22 0302 09/21/22 0235 09/22/22 0428 09/23/22 0405 09/25/22 0456 09/26/22 0509  NA 137 138 142 141 136 137  K 3.6 3.9 3.7 3.8 3.9 4.2  CL 109 108 110 108 102 102  CO2 '25 25 28 27 28 28  '$ GLUCOSE 89 82 81 87 97 86  BUN '13 10 8 9 9 13  '$ CREATININE 0.60 0.59 0.52 0.58 0.63 0.79  CALCIUM 7.5* 7.8* 7.8* 8.1* 8.0* 8.1*  MG 1.8 1.6* 2.0 1.9 1.8 1.9  PHOS 3.4 2.7 2.8 3.8  --  4.0   Estimated Creatinine Clearance: 27.1 mL/min (by C-G formula based on SCr of 0.79 mg/dL). Liver & Pancreas: Recent Labs  Lab 09/22/22 0428 09/23/22 0405 09/24/22 1050 09/25/22 0456 09/26/22 0509  AST   --   --  54* 55* 35  ALT  --   --  29 38 34  ALKPHOS  --   --  50 44 41  BILITOT  --   --  0.5 0.5 0.6  PROT  --   --  5.9* 5.2* 4.7*  ALBUMIN 2.3* 2.5* 3.1* 2.5* 2.4*   No results for input(s): "LIPASE", "AMYLASE" in the last 168 hours.  No results for input(s): "AMMONIA" in the last 168 hours. Diabetic: No results for input(s): "HGBA1C" in the last 72 hours. No results for input(s): "GLUCAP" in the last 168 hours. Cardiac Enzymes: Recent Labs  Lab 09/24/22 1219 09/25/22 0456  CKTOTAL 49 32*   No results for input(s): "PROBNP" in the last 8760 hours. Coagulation Profile: No results for input(s): "INR", "PROTIME" in the last 168 hours.  Thyroid Function Tests: No results for input(s): "TSH", "T4TOTAL", "FREET4", "T3FREE", "THYROIDAB" in the last 72 hours. Lipid Profile: No results for input(s): "CHOL", "HDL", "LDLCALC", "TRIG", "CHOLHDL", "LDLDIRECT" in the last 72 hours. Anemia Panel: No results for input(s): "VITAMINB12", "FOLATE", "FERRITIN", "TIBC", "IRON", "RETICCTPCT" in the last 72 hours.  Urine analysis:    Component Value Date/Time   COLORURINE YELLOW 09/21/2022 1156   APPEARANCEUR CLOUDY (A) 09/21/2022 1156   LABSPEC 1.012 09/21/2022 1156   PHURINE 5.0 09/21/2022 1156   GLUCOSEU NEGATIVE 09/21/2022 1156   HGBUR MODERATE (A) 09/21/2022 1156   BILIRUBINUR NEGATIVE 09/21/2022 1156   KETONESUR NEGATIVE 09/21/2022 1156   PROTEINUR NEGATIVE 09/21/2022 1156   UROBILINOGEN 0.2 01/25/2014 1657   NITRITE POSITIVE (A) 09/21/2022 1156   LEUKOCYTESUR LARGE (A) 09/21/2022 1156   Sepsis Labs: Invalid input(s): "PROCALCITONIN", "LACTICIDVEN"  Microbiology: Recent Results (from the past 240 hour(s))  MRSA Next Gen by PCR, Nasal     Status: None   Collection Time: 09/19/22  3:45 AM   Specimen: Nasal Mucosa; Nasal Swab  Result Value Ref Range Status   MRSA by PCR Next Gen NOT DETECTED NOT DETECTED Final    Comment: (NOTE) The GeneXpert MRSA Assay (FDA approved for NASAL  specimens only), is one component of a comprehensive MRSA colonization surveillance program. It is not intended to diagnose MRSA infection nor to guide or monitor treatment for MRSA infections. Test performance is not FDA approved in patients less than 34 years old. Performed at [facility], [place] [address] [place], [place] [address]   Urine Culture     Status: Abnormal   Collection Time: 09/21/22 11:56 AM   Specimen: Urine, Catheterized  Result Value Ref Range Status   Specimen Description   Final    URINE, CATHETERIZED Performed at [facility] [address] [place], [place] [address]    Special Requests   Final    NONE Performed at [facility], [place] [address] [place], [place] [address]    Culture (A)  Final    >=100,000 COLONIES/mL KLEBSIELLA OXYTOCA >=100,000 COLONIES/mL ESCHERICHIA COLI    Report Status 09/24/2022 FINAL  Final   Organism ID, Bacteria KLEBSIELLA OXYTOCA (A)  Final   Organism ID, Bacteria ESCHERICHIA COLI (A)  Final      Susceptibility   Escherichia coli - MIC*    AMPICILLIN 4 SENSITIVE Sensitive     CEFAZOLIN <=4 SENSITIVE Sensitive     CEFEPIME <=0.12 SENSITIVE Sensitive     CEFTRIAXONE <=0.25 SENSITIVE Sensitive     CIPROFLOXACIN <=0.25 SENSITIVE Sensitive     GENTAMICIN <=1 SENSITIVE Sensitive     IMIPENEM <=0.25 SENSITIVE Sensitive     NITROFURANTOIN <=16 SENSITIVE Sensitive     TRIMETH/SULFA <=20 SENSITIVE Sensitive     AMPICILLIN/SULBACTAM <=2 SENSITIVE Sensitive     PIP/TAZO <=4 SENSITIVE Sensitive     * >=100,000 COLONIES/mL ESCHERICHIA COLI   Klebsiella oxytoca - MIC*    AMPICILLIN >=32 RESISTANT Resistant     CEFAZOLIN <=4 SENSITIVE Sensitive     CEFEPIME <=0.12 SENSITIVE  Sensitive     CEFTRIAXONE <=0.25 SENSITIVE Sensitive     CIPROFLOXACIN <=0.25 SENSITIVE Sensitive     GENTAMICIN <=1 SENSITIVE Sensitive     IMIPENEM <=0.25 SENSITIVE Sensitive     NITROFURANTOIN 32  SENSITIVE Sensitive     TRIMETH/SULFA <=20 SENSITIVE Sensitive     AMPICILLIN/SULBACTAM 8 SENSITIVE Sensitive     PIP/TAZO <=4 SENSITIVE Sensitive     * >=100,000 COLONIES/mL KLEBSIELLA OXYTOCA  Culture, blood (Routine X 2) w Reflex to ID Panel     Status: None (Preliminary result)   Collection Time: 09/25/22 12:42 PM   Specimen: BLOOD RIGHT ARM  Result Value Ref Range Status   Specimen Description   Final    BLOOD RIGHT ARM Performed at [facility] [address] [place], [place] [address]    Special Requests   Final    BOTTLES DRAWN AEROBIC ONLY Blood Culture adequate volume Performed at [facility] [address], [place], [place] [address]    Culture   Final    NO GROWTH < 24 HOURS Performed at [facility] [address] [place], [place] [address]    Report Status PENDING  Incomplete  Culture, blood (Routine X 2) w Reflex to ID Panel     Status: None (Preliminary result)   Collection Time: 09/25/22 12:42 PM   Specimen: Site Not Specified; Blood  Result Value Ref Range Status   Specimen Description   Final    SITE NOT SPECIFIED BLOOD Performed at [facility], [place] [address], [place], [place] [address]    Special Requests   Final    BOTTLES DRAWN AEROBIC ONLY Blood Culture adequate volume Performed at [facility] [address], [place], [place] [address]    Culture   Final    NO GROWTH < 24 HOURS Performed at [facility] [address] [place],  [address]    Report Status PENDING  Incomplete    Radiology Studies: CT CHEST ABDOMEN PELVIS W CONTRAST  Result Date: 09/25/2022 CLINICAL DATA:  Severe back pain, fever. EXAM: CT CHEST, ABDOMEN, AND PELVIS WITH CONTRAST TECHNIQUE: Multidetector CT imaging of the chest, abdomen and pelvis was performed following the standard protocol during bolus administration of intravenous contrast. RADIATION DOSE REDUCTION: This exam was performed  according to the departmental dose-optimization program which includes automated exposure control, adjustment of the mA and/or kV according to patient size and/or use of iterative reconstruction technique. CONTRAST:  81m OMNIPAQUE IOHEXOL 300 MG/ML  SOLN COMPARISON:  August 02, 2019.  September 18, 2022. FINDINGS: CT CHEST FINDINGS Cardiovascular: Atherosclerosis of thoracic aorta is noted without aneurysm or dissection. Mild cardiomegaly. No pericardial effusion. Mediastinum/Nodes: No enlarged mediastinal, hilar, or axillary lymph nodes. Thyroid gland, trachea, and esophagus demonstrate no significant findings. Lungs/Pleura: Small bilateral pleural effusions are noted with minimal adjacent subsegmental atelectasis. No pneumothorax is noted. Stable biapical scarring is noted. 16 x 9 mm part solid density is noted in the right upper lobe best seen on image number 33 of series 4. This appears to be significantly enlarged compared to prior exam. This includes a 7 mm solid nodule. This is highly concerning for malignancy. Musculoskeletal: No chest wall mass or suspicious bone lesions identified. CT ABDOMEN PELVIS FINDINGS Hepatobiliary: Status post cholecystectomy. Mild intrahepatic and extrahepatic biliary dilatation is noted most consistent with post cholecystectomy status. No hepatic abnormality is noted. Pancreas: Stable pancreatic ductal dilatation is noted, but no inflammation is noted.  Spleen: Mild splenomegaly. Adrenals/Urinary Tract: Adrenal glands appear normal. Decreased enhancement of lower pole of right kidney is again noted, with the interval development of multiple small fluid collections in this area. It is uncertain if this represents infarction or potentially infection with multiple small abscesses. No hydronephrosis or renal obstruction is noted. Urinary bladder is unremarkable. Stomach/Bowel: Stomach is unremarkable. There is no evidence of bowel obstruction or inflammation. Extensive sigmoid  diverticulosis is noted. Vascular/Lymphatic: Aortic atherosclerosis. No enlarged abdominal or pelvic lymph nodes. Reproductive: Status post hysterectomy. No adnexal masses. Other: No abdominal wall hernia or abnormality. No abdominopelvic ascites. Musculoskeletal: No acute or significant osseous findings. IMPRESSION: 16 x 9 mm part solid density is noted in right upper lobe which is significantly enlarged compared to prior exam of 2020 where it measured 7 x 8 mm. 7 mm solid nodule is also seen within this abnormality which is increased compared to prior exam. Given the significant increase in size, this is highly concerning for malignancy. Further evaluation with PET scan is recommended. Small bilateral pleural effusions are noted with minimal adjacent subsegmental atelectasis. Decreased enhancement of lower pole of right kidney is again noted, with interval development of multiple small fluid collections within this portion of the kidney. It is uncertain if this represents progressive infarction or potentially infection with multiple small abscesses. Extensive sigmoid diverticulosis is noted without inflammation. Aortic Atherosclerosis (ICD10-I70.0). Electronically Signed   By: Marijo Conception M.D.   On: 09/25/2022 16:49      Kaitlin [name] T. [place]son  If 7PM-7AM, please contact night-coverage [email or web address] 09/26/2022, 2:56 PM
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
## 2022-09-27 DIAGNOSIS — R918 Other nonspecific abnormal finding of lung field: Secondary | ICD-10-CM | POA: Diagnosis not present

## 2022-09-27 DIAGNOSIS — K922 Gastrointestinal hemorrhage, unspecified: Secondary | ICD-10-CM | POA: Diagnosis not present

## 2022-09-27 DIAGNOSIS — N136 Pyonephrosis: Secondary | ICD-10-CM | POA: Diagnosis not present

## 2022-09-27 DIAGNOSIS — Z66 Do not resuscitate: Secondary | ICD-10-CM

## 2022-09-27 DIAGNOSIS — I4819 Other persistent atrial fibrillation: Secondary | ICD-10-CM | POA: Diagnosis not present

## 2022-09-27 DIAGNOSIS — Z7189 Other specified counseling: Secondary | ICD-10-CM | POA: Diagnosis not present

## 2022-09-27 DIAGNOSIS — D62 Acute posthemorrhagic anemia: Secondary | ICD-10-CM | POA: Diagnosis not present

## 2022-09-27 LAB — CBC WITH DIFFERENTIAL/PLATELET
Abs Immature Granulocytes: 0.02 10*3/uL (ref 0.00–0.07)
Basophils Absolute: 0 10*3/uL (ref 0.0–0.1)
Basophils Relative: 1 %
Eosinophils Absolute: 0.1 10*3/uL (ref 0.0–0.5)
Eosinophils Relative: 2 %
HCT: 31.7 % — ABNORMAL LOW (ref 36.0–46.0)
Hemoglobin: 9.9 g/dL — ABNORMAL LOW (ref 12.0–15.0)
Immature Granulocytes: 1 %
Lymphocytes Relative: 22 %
Lymphs Abs: 0.8 10*3/uL (ref 0.7–4.0)
MCH: 30.5 pg (ref 26.0–34.0)
MCHC: 31.2 g/dL (ref 30.0–36.0)
MCV: 97.5 fL (ref 80.0–100.0)
Monocytes Absolute: 0.4 10*3/uL (ref 0.1–1.0)
Monocytes Relative: 11 %
Neutro Abs: 2.4 10*3/uL (ref 1.7–7.7)
Neutrophils Relative %: 63 %
Platelets: 217 10*3/uL (ref 150–400)
RBC: 3.25 MIL/uL — ABNORMAL LOW (ref 3.87–5.11)
RDW: 15 % (ref 11.5–15.5)
WBC: 3.8 10*3/uL — ABNORMAL LOW (ref 4.0–10.5)
nRBC: 0 % (ref 0.0–0.2)

## 2022-09-27 LAB — RENAL FUNCTION PANEL
Albumin: 3 g/dL — ABNORMAL LOW (ref 3.5–5.0)
Anion gap: 8 (ref 5–15)
BUN: 12 mg/dL (ref 8–23)
CO2: 31 mmol/L (ref 22–32)
Calcium: 8.4 mg/dL — ABNORMAL LOW (ref 8.9–10.3)
Chloride: 105 mmol/L (ref 98–111)
Creatinine, Ser: 0.66 mg/dL (ref 0.44–1.00)
GFR, Estimated: >60 mL/min (ref >60)
Glucose, Bld: 90 mg/dL (ref 70–99)
Phosphorus: 3.7 mg/dL (ref 2.5–4.6)
Potassium: 3.7 mmol/L (ref 3.5–5.1)
Sodium: 144 mmol/L (ref 135–145)

## 2022-09-27 LAB — MAGNESIUM: Magnesium: 2.2 mg/dL (ref 1.7–2.4)

## 2022-09-27 MED ORDER — SODIUM CHLORIDE 0.9 % IV SOLN: INTRAVENOUS | Status: DC | PRN

## 2022-09-27 NOTE — Progress Notes (Signed)
PROGRESS NOTE  Kaitlin [name] YBO:[id number] DOB: [birth year]   PCP: [name], MD  Patient is from: Home.  DOA: 09/18/2022 LOS: 8  Chief complaints Chief Complaint  Patient presents with   Rectal Bleeding     Brief Narrative / Interim history: 88 year old F with PMH of persistent A-fib on Eliquis, CVA, LBBB, anxiety, esophageal stricture s/p dilation and diverticulosis presenting with acute rectal bleed that started the day of presentation, and admitted for acute blood loss anemia due to rectal bleed.  Hgb 10.6 but dropped further to 7.1.  Received Kcentra in ED.  She was transfused 3 units.  CT angio without extravasation but diverticulosis.  [facility] consulted.  EGD showed tortuous esophagus otherwise normal. Flexible sig blood in the rectum and in the sigmoid colon, severe diverticulosis in sigmoid colon and narrowing of colon in association with the diverticular opening.  Bleeding seems to have subsided.  Transfused additional 1 unit on 12/10.  H&H stable.  Tolerating soft diet.  GI signed off.  Patient has urinary tract infection.  Urine culture with pansensitive Klebsiella oxytoca and E. Coli.  She received IV ceftriaxone for 4 days followed by p.o. cefadroxil for 1 day.  However, she had acute on chronic back pain, right flank pain and mild temp. lumbar x-ray showed moderate to severe multilevel DDD.  CT chest/abdomen/pelvis with contrast showed possible right nephric abscesses versus infarction and slightly enlarged RUE nodule measuring 16 x 9 mm (from 7 x 8 mm in 2020).  Antibiotics broadened to IV Zosyn.  Infectious disease consulted.  Antibiotics de-escalated to IV ceftriaxone.   Subjective: Seen and examined earlier this morning.  No major events overnight of this morning.  Some left upper back and shoulder pain that she attributes to the bed on the pillow.  Back pain is fairly controlled with current regimen.  Dysuria resolved.  Reports some loose stool.  Had  extensive goals of care discussion.  Patient's son, Ronalee Belts at bedside.  Objective: Vitals:   09/26/22 1415 09/26/22 2129 09/27/22 0511 09/27/22 1347  BP: (!) 130/91 (!) 123/56 (!) 138/54 (!) 112/57  Pulse: (!) 48 63 64 90  Resp: '16 17 16   '$ Temp:  98.1 F (36.7 C) (!) 97.5 F (36.4 C)   TempSrc:  Oral Oral   SpO2: 100% 97% 100% 99%  Weight:      Height:        Examination:  GENERAL: Appears frail.  Nontoxic. HEENT: MMM.  Vision and hearing grossly intact.  NECK: Supple.  No apparent JVD.  RESP:  No IWOB.  Fair aeration bilaterally. CVS:  RRR. Heart sounds normal.  ABD/GI/GU: BS+. Abd soft.  Mild diffuse tenderness. MSK/EXT:  Moves extremities.  Significant muscle mass and subcu fat loss. SKIN: no apparent skin lesion or wound NEURO: Awake and alert. Oriented appropriately.  Some resting tremor in RUE.  No apparent focal neuro deficit. PSYCH: Calm. Normal affect.   Procedures:  None  Microbiology summarized: 12/7-MRSA PCR screen nonreactive. 12/9-urine culture with pansensitive Klebsiella oxytoca and E. Coli 12/13-blood cultures NGTD   Assessment and plan: Principal Problem:   Acute GI bleeding Active Problems:   Persistent atrial fibrillation (HCC)   ANXIETY DEPRESSION   Diverticulosis of colon with hemorrhage   Chronic back pain   Osteoarthritis   Tachy-brady syndrome (HCC)   History of stroke   Multiple lung nodules on CT   Acute blood loss anemia   Underweight   Intractable low back pain   Acute  pyonephrosis   Goals of care, counseling/discussion   DNR (do not resuscitate)  Acute right pyonephritis: Patient was treated for UTI with appropriate antibiotics for 5 days as below.  Despite that, she had acute on chronic back pain, right flank pain and mild temp to 99.36F.  No leukocytosis but WBC went up to 9.1.  CT concerning for right nephric abscesses versus infarction.  Blood cultures NGTD. -Appreciate input by ID.  De-escalated antibiotics to IV  ceftriaxone.  UTI due to pansensitive Klebsiella oxytoca and E. coli.  Received IV ceftriaxone for 4 days followed by p.o. cefadroxil for 1 day -Antibiotics as above.  Acute blood loss anemia due to rectal bleed: Likely diverticular.  CT did not show extravasation.  Received Kcentra in ED.  EGD negative except for torturous esophagus.  Flex sig with blood in rectum and sigmoid colon and  severe sigmoid diverticulosis.  Bleeding seems to have subsided.  Received 4 units. Recent Labs    09/19/22 1715 09/20/22 0302 09/21/22 0235 09/22/22 0428 09/22/22 2007 09/23/22 0405 09/24/22 0427 09/25/22 0456 09/26/22 0509 09/27/22 0539  HGB 8.7* 8.0* 7.8* 7.6* 9.7* 9.5* 9.4* 9.1* 8.9* 9.9*  -Cardiology recommended holding Eliquis until outpatient follow-up in February -Upgraded to regular diet at the patient's and family request although SLP recommended dysphagia 3 diet -GI signed off.  Persistent A-fib with mild RVR: Not on rate or rhythm control.  On low-dose Eliquis POA.  CHA2DS2-VASc score 7 but she has significant risk for bleeding.  Cardiology recommended holding Eliquis until outpatient follow-up in February. -IV metoprolol 2.5 mg as needed for sustained HR > 120. -Discussed cardiology recommendation with patient and family at bedside.   Right upper lobe nodule: CT chest on 12/13 showed RUL solid density measuring 16 x 9 mm (from 7 x 8 mm in 2020).  Radiology recommended PET scan outpatient.  Discussed with patient's daughter, granddaughter and son.  Given age, frailty and slow progression, family not interested in pursuing evaluation or treatment for this.   Anxiety and depression: On low-dose Xanax as needed at home -Home Xanax 0.25 mg every 8 hours as needed  Acute urinary retention: Had about 730 cc urine output after Foley insertion.  Passed voiding trial.  Intractable acute on chronic back pain: No radiculopathy.  Patient is on [place] for chronic back pain.  Lumbar film shows moderate  to severe multilevel DDD.  CT concerning for right kidney infection. -Pain is better controlled with scheduled Tylenol and as needed oxycodone. -MiraLAX for constipation.  Would avoid senna given diverticular bleed. -Manage kidney infection as above -OOB/PT/OT  Possible dysphagia/history of esophageal stricture s/p dilation: Noted to have choking while eating soft diet. -SLP recommended dysphagia 3 diet but patient and family would like to try regular diet so she can have vegetables -Aspiration precaution  Abdominal pain/nausea: Seems to have resolved.  Constipation: Could be due to opiate.  Seems to have resolved. -Continue MiraLAX -Would avoid senna given diverticular bleed.  Goal of care counseling: DNR/DNI. -See IPAL note  History of CVA: Stable. History of LBBB: Stable  Underweight/unintentional weight loss: Reports about 9 pounds weight loss in the last 8 months. Body mass index is 14.33 kg/m. Nutrition Problem: Increased nutrient needs Etiology: acute illness Signs/Symptoms: estimated needs Interventions: Ensure Enlive (each supplement provides 350kcal and 20 grams of protein), MVI   DVT prophylaxis:  SCDs Start: 09/19/22 0045  Code Status: DNR/DNI. Family Communication: Updated patient's son, Ronalee Belts at bedside. Level of care: Med-Surg Status is: Inpatient Remains  inpatient appropriate because: Acute right pyonephritis   Final disposition: Home with home health Consultants:  GI-signed off Cardiology-over the phone Infectious disease  Sch Meds:  Scheduled Meds:  acetaminophen  650 mg Oral Q6H WA   feeding supplement  237 mL Oral BID BM   lidocaine  1 patch Transdermal Q24H   multivitamin with minerals  1 tablet Oral Daily   sodium chloride flush  3 mL Intravenous Q12H   Continuous Infusions:  cefTRIAXone (ROCEPHIN)  IV 2 g (09/26/22 2059)    PRN Meds:.ALPRAZolam, metoprolol tartrate, ondansetron **OR** ondansetron (ZOFRAN) IV, mouth rinse, oxyCODONE,  polyethylene glycol  Antimicrobials: Anti-infectives (From admission, onward)    Start     Dose/Rate Route Frequency Ordered Stop   09/26/22 2000  cefTRIAXone (ROCEPHIN) 2 g in sodium chloride 0.9 % 100 mL IVPB        2 g 200 mL/hr over 30 Minutes Intravenous Every 24 hours 09/26/22 1646     09/25/22 2000  cefadroxil (DURICEF) capsule 500 mg  Status:  Discontinued        500 mg Oral 2 times daily 09/25/22 1220 09/25/22 1845   09/25/22 2000  piperacillin-tazobactam (ZOSYN) IVPB 3.375 g  Status:  Discontinued        3.375 g 12.5 mL/hr over 240 Minutes Intravenous Every 8 hours 09/25/22 1901 09/26/22 1646   09/25/22 0800  cefadroxil (DURICEF) capsule 500 mg        500 mg Oral Once 09/24/22 1409 09/25/22 0753   09/24/22 0000  cefadroxil (DURICEF) 500 MG capsule  Status:  Discontinued        500 mg Oral 2 times daily 09/24/22 0744 09/24/22    09/21/22 1600  cefTRIAXone (ROCEPHIN) 1 g in sodium chloride 0.9 % 100 mL IVPB  Status:  Discontinued        1 g 200 mL/hr over 30 Minutes Intravenous Daily 09/21/22 1531 09/24/22 1409        I have personally reviewed the following labs and images: CBC: Recent Labs  Lab 09/23/22 0405 09/24/22 0427 09/25/22 0456 09/26/22 0509 09/27/22 0539  WBC 3.6* 5.1 9.1 5.7 3.8*  NEUTROABS  --   --   --   --  2.4  HGB 9.5* 9.4* 9.1* 8.9* 9.9*  HCT 29.6* 29.6* 28.7* 28.9* 31.7*  MCV 94.0 95.2 95.7 97.0 97.5  PLT 143* 175 198 199 217   BMP &GFR Recent Labs  Lab 09/21/22 0235 09/22/22 0428 09/23/22 0405 09/25/22 0456 09/26/22 0509 09/27/22 0539  NA 138 142 141 136 137 144  K 3.9 3.7 3.8 3.9 4.2 3.7  CL 108 110 108 102 102 105  CO2 '25 28 27 28 28 31  '$ GLUCOSE 82 81 87 97 86 90  BUN '10 8 9 9 13 12  '$ CREATININE 0.59 0.52 0.58 0.63 0.79 0.66  CALCIUM 7.8* 7.8* 8.1* 8.0* 8.1* 8.4*  MG 1.6* 2.0 1.9 1.8 1.9 2.2  PHOS 2.7 2.8 3.8  --  4.0 3.7   Estimated Creatinine Clearance: 27.1 mL/min (by C-G formula based on SCr of 0.66 mg/dL). Liver &  Pancreas: Recent Labs  Lab 09/23/22 0405 09/24/22 1050 09/25/22 0456 09/26/22 0509 09/27/22 0539  AST  --  54* 55* 35  --   ALT  --  29 38 34  --   ALKPHOS  --  50 44 41  --   BILITOT  --  0.5 0.5 0.6  --   PROT  --  5.9* 5.2* 4.7*  --  ALBUMIN 2.5* 3.1* 2.5* 2.4* 3.0*   No results for input(s): "LIPASE", "AMYLASE" in the last 168 hours. No results for input(s): "AMMONIA" in the last 168 hours. Diabetic: No results for input(s): "HGBA1C" in the last 72 hours. No results for input(s): "GLUCAP" in the last 168 hours. Cardiac Enzymes: Recent Labs  Lab 09/24/22 1219 09/25/22 0456  CKTOTAL 49 32*   No results for input(s): "PROBNP" in the last 8760 hours. Coagulation Profile: No results for input(s): "INR", "PROTIME" in the last 168 hours.  Thyroid Function Tests: No results for input(s): "TSH", "T4TOTAL", "FREET4", "T3FREE", "THYROIDAB" in the last 72 hours. Lipid Profile: No results for input(s): "CHOL", "HDL", "LDLCALC", "TRIG", "CHOLHDL", "LDLDIRECT" in the last 72 hours. Anemia Panel: No results for input(s): "VITAMINB12", "FOLATE", "FERRITIN", "TIBC", "IRON", "RETICCTPCT" in the last 72 hours.  Urine analysis:    Component Value Date/Time   COLORURINE YELLOW 09/21/2022 1156   APPEARANCEUR CLOUDY (A) 09/21/2022 1156   LABSPEC 1.012 09/21/2022 1156   PHURINE 5.0 09/21/2022 1156   GLUCOSEU NEGATIVE 09/21/2022 1156   HGBUR MODERATE (A) 09/21/2022 1156   BILIRUBINUR NEGATIVE 09/21/2022 1156   KETONESUR NEGATIVE 09/21/2022 1156   PROTEINUR NEGATIVE 09/21/2022 1156   UROBILINOGEN 0.2 01/25/2014 1657   NITRITE POSITIVE (A) 09/21/2022 1156   LEUKOCYTESUR LARGE (A) 09/21/2022 1156   Sepsis Labs: Invalid input(s): "PROCALCITONIN", "LACTICIDVEN"  Microbiology: Recent Results (from the past 240 hour(s))  MRSA Next Gen by PCR, Nasal     Status: None   Collection Time: 09/19/22  3:45 AM   Specimen: Nasal Mucosa; Nasal Swab  Result Value Ref Range Status   MRSA by PCR  Next Gen NOT DETECTED NOT DETECTED Final    Comment: (NOTE) The GeneXpert MRSA Assay (FDA approved for NASAL specimens only), is one component of a comprehensive MRSA colonization surveillance program. It is not intended to diagnose MRSA infection nor to guide or monitor treatment for MRSA infections. Test performance is not FDA approved in patients less than 4 years old. Performed at [facility], [place] [address] [place], [place] [address]   Urine Culture     Status: Abnormal   Collection Time: 09/21/22 11:56 AM   Specimen: Urine, Catheterized  Result Value Ref Range Status   Specimen Description   Final    URINE, CATHETERIZED Performed at [facility] [address], [place], [place] [address]    Special Requests   Final    NONE Performed at [facility], [place] [address] [place], [place] [address]    Culture (A)  Final    >=100,000 COLONIES/mL KLEBSIELLA OXYTOCA >=100,000 COLONIES/mL ESCHERICHIA COLI    Report Status 09/24/2022 FINAL  Final   Organism ID, Bacteria KLEBSIELLA OXYTOCA (A)  Final   Organism ID, Bacteria ESCHERICHIA COLI (A)  Final      Susceptibility   Escherichia coli - MIC*    AMPICILLIN 4 SENSITIVE Sensitive     CEFAZOLIN <=4 SENSITIVE Sensitive     CEFEPIME <=0.12 SENSITIVE Sensitive     CEFTRIAXONE <=0.25 SENSITIVE Sensitive     CIPROFLOXACIN <=0.25 SENSITIVE Sensitive     GENTAMICIN <=1 SENSITIVE Sensitive     IMIPENEM <=0.25 SENSITIVE Sensitive     NITROFURANTOIN <=16 SENSITIVE Sensitive     TRIMETH/SULFA <=20 SENSITIVE Sensitive     AMPICILLIN/SULBACTAM <=2 SENSITIVE Sensitive     PIP/TAZO <=4 SENSITIVE Sensitive     * >=100,000 COLONIES/mL ESCHERICHIA COLI   Klebsiella oxytoca - MIC*  AMPICILLIN >=32 RESISTANT Resistant     CEFAZOLIN <=4 SENSITIVE Sensitive     CEFEPIME <=0.12 SENSITIVE Sensitive     CEFTRIAXONE <=0.25 SENSITIVE Sensitive     CIPROFLOXACIN <=0.25 SENSITIVE Sensitive      GENTAMICIN <=1 SENSITIVE Sensitive     IMIPENEM <=0.25 SENSITIVE Sensitive     NITROFURANTOIN 32 SENSITIVE Sensitive     TRIMETH/SULFA <=20 SENSITIVE Sensitive     AMPICILLIN/SULBACTAM 8 SENSITIVE Sensitive     PIP/TAZO <=4 SENSITIVE Sensitive     * >=100,000 COLONIES/mL KLEBSIELLA OXYTOCA  Culture, blood (Routine X 2) w Reflex to ID Panel     Status: None (Preliminary result)   Collection Time: 09/25/22 12:42 PM   Specimen: BLOOD RIGHT ARM  Result Value Ref Range Status   Specimen Description   Final    BLOOD RIGHT ARM Performed at [facility] [address], [place], [place] [address]    Special Requests   Final    BOTTLES DRAWN AEROBIC ONLY Blood Culture adequate volume Performed at [facility] [address] [place], [place] [address]    Culture   Final    NO GROWTH 2 DAYS Performed at [facility] [address], [place], [place] [address]    Report Status PENDING  Incomplete  Culture, blood (Routine X 2) w Reflex to ID Panel     Status: None (Preliminary result)   Collection Time: 09/25/22 12:42 PM   Specimen: Site Not Specified; Blood  Result Value Ref Range Status   Specimen Description   Final    SITE NOT SPECIFIED BLOOD Performed at [facility], [place] [address] [place], [place] [address]    Special Requests   Final    BOTTLES DRAWN AEROBIC ONLY Blood Culture adequate volume Performed at [facility] [address] [place], [place] [address]    Culture   Final    NO GROWTH 2 DAYS Performed at [facility] [address], [place],  [address]    Report Status PENDING  Incomplete    Radiology Studies: No results found.    Marg Macmaster T. [place]  If 7PM-7AM, please contact night-coverage [email or web address] 09/27/2022, 2:13 PM

## 2022-09-27 NOTE — IPAL (Signed)
  Interdisciplinary Goals of Care Family Meeting   Date carried out: 09/27/2022  Location of the meeting: Bedside  Member's involved: Physician and Family Member or next of kin (patient's son, Kaitlin [name])  Durable Power of Tour manager: Patient and patient's son  Discussion: We discussed goals of care for Kaitlin [name].  88 year old female with diverticular bleed, possible lung cancer and right nephric abscesses.  She is very frail.  We discussed about CODE STATUS including pros and cons of CPR and intubation in light of her current condition.  Chance of successful CPR in her case is close to none.  It also comes with serious harm including chest wall fractures, pain and poor quality of afterward.  Patient voiced understanding.  She agrees with DNR/DNI.  Patient's son, Kaitlin [name] concurs with her.  CODE STATUS changed to DNR/DNI.  RN notified  Code status:   Code Status: DNR   Disposition: Continue current acute care  Time spent for the meeting: 30 minutes    Mercy Riding, MD  09/27/2022, 9:51 AM

## 2022-09-27 NOTE — Progress Notes (Signed)
[facility]  Date of Admission:  09/18/2022           Reason for visit: Follow up on UTI, possible renal abscess  Current antibiotics: Ceftriaxone  ASSESSMENT:    88 y.o. female admitted with:  #Klebsiella oxytoca and E. coli urinary tract infection: Complicated by the possibility of multiple small renal abscesses vs progressive infarction.  #Right upper lobe pulmonary nodule: Noted on CT 12/13 with concern for malignancy.  RECOMMENDATIONS:    Continue ceftriaxone 2gm daily for now pending further improvement Dr Linus Salmons here as needed over the weekend.  Dr Candiss Norse is back on Monday   Principal Problem:   Acute GI bleeding Active Problems:   ANXIETY DEPRESSION   Diverticulosis of colon with hemorrhage   Chronic back pain   Osteoarthritis   Tachy-brady syndrome (HCC)   Persistent atrial fibrillation (HCC)   History of stroke   Multiple lung nodules on CT   Acute blood loss anemia   Underweight   Intractable low back pain   Acute pyonephrosis   Goals of care, counseling/discussion    MEDICATIONS:    Scheduled Meds:  acetaminophen  650 mg Oral Q6H WA   feeding supplement  237 mL Oral BID BM   lidocaine  1 patch Transdermal Q24H   multivitamin with minerals  1 tablet Oral Daily   sodium chloride flush  3 mL Intravenous Q12H   Continuous Infusions:  cefTRIAXone (ROCEPHIN)  IV 2 g (09/26/22 2059)   PRN Meds:.ALPRAZolam, metoprolol tartrate, ondansetron **OR** ondansetron (ZOFRAN) IV, mouth rinse, oxyCODONE, polyethylene glycol  SUBJECTIVE:   24 hour events:  Tmax 98.1 Has been receiving Tylenol consistently WBC 3.8 Hemoglobin 9.9 Creatinine 0.66 Creatinine clearance 27  Patient seen at the bedside with 2 family members present.  Patient reports feeling chilled.  She continues to report burning at the onset of her urination.  She does report that this is improving.  She was started on ceftriaxone yesterday at approximately 9 PM.   They had many questions regarding where this possible renal infection came from.  Review of Systems  All other systems reviewed and are negative.     OBJECTIVE:   Blood pressure (!) 138/54, pulse 64, temperature (!) 97.5 F (36.4 C), temperature source Oral, resp. rate 16, height '5\' 5"'$  (1.651 m), weight 39.1 kg, SpO2 100 %. Body mass index is 14.33 kg/m.  Physical Exam Constitutional:      Comments: Elderly, very pleasant, lying in bed, not in acute distress  HENT:     Head: Normocephalic and atraumatic.  Eyes:     Extraocular Movements: Extraocular movements intact.     Conjunctiva/sclera: Conjunctivae normal.  Pulmonary:     Effort: Pulmonary effort is normal. No respiratory distress.  Abdominal:     General: There is no distension.     Palpations: Abdomen is soft.  Musculoskeletal:        General: Normal range of motion.     Cervical back: Normal range of motion and neck supple.  Skin:    General: Skin is warm and dry.  Neurological:     General: No focal deficit present.     Mental Status: Mental status is at baseline.  Psychiatric:        Mood and Affect: Mood normal.        Behavior: Behavior normal.      Lab Results: Lab Results  Component Value Date   WBC 3.8 (L) 09/27/2022  HGB 9.9 (L) 09/27/2022   HCT 31.7 (L) 09/27/2022   MCV 97.5 09/27/2022   PLT 217 09/27/2022    Lab Results  Component Value Date   NA 144 09/27/2022   K 3.7 09/27/2022   CO2 31 09/27/2022   GLUCOSE 90 09/27/2022   BUN 12 09/27/2022   CREATININE 0.66 09/27/2022   CALCIUM 8.4 (L) 09/27/2022   GFRNONAA >60 09/27/2022   GFRAA 82 08/17/2020    Lab Results  Component Value Date   ALT 34 09/26/2022   AST 35 09/26/2022   ALKPHOS 41 09/26/2022   BILITOT 0.6 09/26/2022       Component Value Date/Time   CRP 0.4 (L) 07/01/2016 0941       Component Value Date/Time   ESRSEDRATE 13 08/11/2017 1529     I have reviewed the micro and lab results in Epic.  Imaging: CT  CHEST ABDOMEN PELVIS W CONTRAST  Result Date: 09/25/2022 CLINICAL DATA:  Severe back pain, fever. EXAM: CT CHEST, ABDOMEN, AND PELVIS WITH CONTRAST TECHNIQUE: Multidetector CT imaging of the chest, abdomen and pelvis was performed following the standard protocol during bolus administration of intravenous contrast. RADIATION DOSE REDUCTION: This exam was performed according to the departmental dose-optimization program which includes automated exposure control, adjustment of the mA and/or kV according to patient size and/or use of iterative reconstruction technique. CONTRAST:  21m OMNIPAQUE IOHEXOL 300 MG/ML  SOLN COMPARISON:  August 02, 2019.  September 18, 2022. FINDINGS: CT CHEST FINDINGS Cardiovascular: Atherosclerosis of thoracic aorta is noted without aneurysm or dissection. Mild cardiomegaly. No pericardial effusion. Mediastinum/Nodes: No enlarged mediastinal, hilar, or axillary lymph nodes. Thyroid gland, trachea, and esophagus demonstrate no significant findings. Lungs/Pleura: Small bilateral pleural effusions are noted with minimal adjacent subsegmental atelectasis. No pneumothorax is noted. Stable biapical scarring is noted. 16 x 9 mm part solid density is noted in the right upper lobe best seen on image number 33 of series 4. This appears to be significantly enlarged compared to prior exam. This includes a 7 mm solid nodule. This is highly concerning for malignancy. Musculoskeletal: No chest wall mass or suspicious bone lesions identified. CT ABDOMEN PELVIS FINDINGS Hepatobiliary: Status post cholecystectomy. Mild intrahepatic and extrahepatic biliary dilatation is noted most consistent with post cholecystectomy status. No hepatic abnormality is noted. Pancreas: Stable pancreatic ductal dilatation is noted, but no inflammation is noted. Spleen: Mild splenomegaly. Adrenals/Urinary Tract: Adrenal glands appear normal. Decreased enhancement of lower pole of right kidney is again noted, with the interval  development of multiple small fluid collections in this area. It is uncertain if this represents infarction or potentially infection with multiple small abscesses. No hydronephrosis or renal obstruction is noted. Urinary bladder is unremarkable. Stomach/Bowel: Stomach is unremarkable. There is no evidence of bowel obstruction or inflammation. Extensive sigmoid diverticulosis is noted. Vascular/Lymphatic: Aortic atherosclerosis. No enlarged abdominal or pelvic lymph nodes. Reproductive: Status post hysterectomy. No adnexal masses. Other: No abdominal wall hernia or abnormality. No abdominopelvic ascites. Musculoskeletal: No acute or significant osseous findings. IMPRESSION: 16 x 9 mm part solid density is noted in right upper lobe which is significantly enlarged compared to prior exam of 2020 where it measured 7 x 8 mm. 7 mm solid nodule is also seen within this abnormality which is increased compared to prior exam. Given the significant increase in size, this is highly concerning for malignancy. Further evaluation with PET scan is recommended. Small bilateral pleural effusions are noted with minimal adjacent subsegmental atelectasis. Decreased enhancement of lower pole of right  kidney is again noted, with interval development of multiple small fluid collections within this portion of the kidney. It is uncertain if this represents progressive infarction or potentially infection with multiple small abscesses. Extensive sigmoid diverticulosis is noted without inflammation. Aortic Atherosclerosis (ICD10-I70.0). Electronically Signed   By: Marijo Conception M.D.   On: 09/25/2022 16:49   DG Lumbar Spine 2-3 Views  Result Date: 09/25/2022 CLINICAL DATA:  Back pain EXAM: LUMBAR SPINE - 2-3 VIEW COMPARISON:  None Available. FINDINGS: Dextroconvex upper and levoconvex lower curvature of the lumbar spine. No evidence of lumbar spine fracture. There is moderate severe multilevel degenerative disc disease and with asymmetric  left disc height loss at L1-L2 and L2-L3 and asymmetric right disc height loss at L3-L4 and L4-L5. Severe disc height loss at L5-S1. There is moderate-severe lower lumbar predominant facet arthropathy. Trace degenerative anterolisthesis at L4-L5 and retrolisthesis at L5-S1. IMPRESSION: Moderate-severe multilevel degenerative disc disease and lower lumbar predominant facet arthropathy. Trace degenerative anterolisthesis at L4-L5 and retrolisthesis at L5-S1. Electronically Signed   By: Maurine Simmering M.D.   On: 09/25/2022 11:51     Imaging independently reviewed in Epic.    [facility] [phone] pager 09/27/2022, 9:57 AM

## 2022-09-28 DIAGNOSIS — K922 Gastrointestinal hemorrhage, unspecified: Secondary | ICD-10-CM | POA: Diagnosis not present

## 2022-09-28 LAB — CBC
HCT: 33.9 % — ABNORMAL LOW (ref 36.0–46.0)
Hemoglobin: 10.5 g/dL — ABNORMAL LOW (ref 12.0–15.0)
MCH: 30.2 pg (ref 26.0–34.0)
MCHC: 31 g/dL (ref 30.0–36.0)
MCV: 97.4 fL (ref 80.0–100.0)
Platelets: 240 10*3/uL (ref 150–400)
RBC: 3.48 MIL/uL — ABNORMAL LOW (ref 3.87–5.11)
RDW: 14.8 % (ref 11.5–15.5)
WBC: 4.3 10*3/uL (ref 4.0–10.5)
nRBC: 0 % (ref 0.0–0.2)

## 2022-09-28 LAB — RENAL FUNCTION PANEL
Albumin: 3.1 g/dL — ABNORMAL LOW (ref 3.5–5.0)
Anion gap: 8 (ref 5–15)
BUN: 12 mg/dL (ref 8–23)
CO2: 29 mmol/L (ref 22–32)
Calcium: 8.6 mg/dL — ABNORMAL LOW (ref 8.9–10.3)
Chloride: 103 mmol/L (ref 98–111)
Creatinine, Ser: 0.54 mg/dL (ref 0.44–1.00)
GFR, Estimated: >60 mL/min (ref >60)
Glucose, Bld: 86 mg/dL (ref 70–99)
Phosphorus: 2.9 mg/dL (ref 2.5–4.6)
Potassium: 3.5 mmol/L (ref 3.5–5.1)
Sodium: 140 mmol/L (ref 135–145)

## 2022-09-28 LAB — MAGNESIUM: Magnesium: 2 mg/dL (ref 1.7–2.4)

## 2022-09-28 MED ORDER — ADULT MULTIVITAMIN LIQUID CH
15.0000 mL | Freq: Every day | ORAL | Status: DC
  Administered 2022-09-29 – 2022-09-30 (×2): 15 mL via ORAL
  Filled 2022-09-28 (×2): qty 15

## 2022-09-28 NOTE — Progress Notes (Signed)
Progress Note   Patient: Kaitlin [name] JTT:[id number] DOB: 1927/09/11 DOA: 09/18/2022     9 DOS: the patient was seen and examined on 09/28/2022   Brief hospital course: 88 year old F with PMH of persistent A-fib on Eliquis, CVA, LBBB, anxiety, esophageal stricture s/p dilation and diverticulosis presenting with acute rectal bleed that started the day of presentation, and admitted for acute blood loss anemia due to rectal bleed.  Hgb 10.6 but dropped further to 7.1.  Received Kcentra in ED.  She was transfused 3 units.  CT angio without extravasation but diverticulosis.  [facility] consulted.   EGD showed tortuous esophagus otherwise normal. Flexible sig blood in the rectum and in the sigmoid colon, severe diverticulosis in sigmoid colon and narrowing of colon in association with the diverticular opening.  Bleeding seems to have subsided.  Transfused additional 1 unit on 12/10.  H&H stable.  Tolerating soft diet.  GI signed off.   Patient has urinary tract infection.  Urine culture with pansensitive Klebsiella oxytoca and E. Coli.  She received IV ceftriaxone for 4 days followed by p.o. cefadroxil for 1 day.  However, she had acute on chronic back pain, right flank pain and mild temp. lumbar x-ray showed moderate to severe multilevel DDD.  CT chest/abdomen/pelvis with contrast showed possible right nephric abscesses versus infarction and slightly enlarged RUE nodule measuring 16 x 9 mm (from 7 x 8 mm in 2020).  Antibiotics broadened to IV Zosyn.  Infectious disease consulted.  Antibiotics de-escalated to IV ceftriaxone.  Assessment and Plan: Acute right pyonephritis: Patient was treated for UTI with appropriate antibiotics for 5 days as below.  Despite that, she had acute on chronic back pain, right flank pain and mild temp to 99.42F.  No leukocytosis but WBC went up to 9.1.  CT concerning for right nephric abscesses versus infarction.  Blood cultures NGTD. -Appreciate input by ID.  De-escalated  antibiotics to IV ceftriaxone. -Per ID plan to continue rocephin   UTI due to pansensitive Klebsiella oxytoca and E. coli.  Received IV ceftriaxone for 4 days followed by p.o. cefadroxil for 1 day -Continue abx as per above   Acute blood loss anemia due to rectal bleed: Likely diverticular.  CT did not show extravasation.  Received Kcentra in ED.  EGD negative except for torturous esophagus.  Flex sig with blood in rectum and sigmoid colon and  severe sigmoid diverticulosis.  Bleeding seems to have subsided.  Received 4 units. -Cardiology recommended holding Eliquis until outpatient follow-up in February -Upgraded to regular diet at the patient's and family request although SLP recommended dysphagia 3 diet -GI signed off.   Persistent A-fib with mild RVR: Not on rate or rhythm control.  On low-dose Eliquis POA.  CHA2DS2-VASc score 7 but she has significant risk for bleeding.  Cardiology recommended holding Eliquis until outpatient follow-up in February. -IV metoprolol 2.5 mg as needed for sustained HR > 120. -Dr. Cyndia Skeeters discussed cardiology recommendation with patient and family at bedside.   Right upper lobe nodule: CT chest on 12/13 showed RUL solid density measuring 16 x 9 mm (from 7 x 8 mm in 2020).  Radiology recommended PET scan outpatient.  Dr. Cyndia Skeeters discussed with patient's daughter, granddaughter and son.  Given age, frailty and slow progression, family not interested in pursuing evaluation or treatment for this.   Anxiety and depression: On low-dose Xanax as needed at home -Home Xanax 0.25 mg every 8 hours as needed   Acute urinary retention: Had about 730 cc  urine output after Foley insertion.  Passed voiding trial.   Intractable acute on chronic back pain: No radiculopathy.  Patient is on Fowler for chronic back pain.  Lumbar film shows moderate to severe multilevel DDD.  CT concerning for right kidney infection. -Pain is better controlled with scheduled Tylenol and as needed  oxycodone. -MiraLAX for constipation.  Would avoid senna given diverticular bleed. -Manage kidney infection as above -OOB/PT/OT   Possible dysphagia/history of esophageal stricture s/p dilation: Noted to have choking while eating soft diet. -SLP recommended dysphagia 3 diet but patient and family would like to try regular diet so she can have vegetables -Aspiration precaution   Abdominal pain/nausea: Seems to have resolved.   Constipation: Could be due to opiate.  Seems to have resolved. -Continue MiraLAX -Would avoid senna given diverticular bleed.   Goal of care counseling: DNR/DNI. -See IPAL note by Dr. Cyndia Skeeters   History of CVA: Stable. History of LBBB: Stable   Underweight/unintentional weight loss: Reports about 9 pounds weight loss in the last 8 months. Body mass index is 14.33 kg/m. Nutrition Problem: Increased nutrient needs Etiology: acute illness Signs/Symptoms: estimated needs Interventions: Ensure Enlive (each supplement provides 350kcal and 20 grams of protein), MVI     Subjective: Complaining of B flank pains still this AM  Physical Exam: Vitals:   09/27/22 1347 09/27/22 2032 09/28/22 0449 09/28/22 1257  BP: (!) 112/57 (!) 155/58 135/74 112/68  Pulse: 90 67 84 (!) 58  Resp:  '20 18 18  '$ Temp: 97.8 F (36.6 C) 98.3 F (36.8 C) 97.7 F (36.5 C) 98 F (36.7 C)  TempSrc: Oral Oral Oral Oral  SpO2: 99% 100% 96% 100%  Weight:      Height:       General exam: Awake, laying in bed, in nad Respiratory system: Normal respiratory effort, no wheezing Cardiovascular system: regular rate, s1, s2 Gastrointestinal system: Soft, nondistended, positive BS Central nervous system: CN2-12 grossly intact, strength intact Extremities: Perfused, no clubbing Skin: Normal skin turgor, no notable skin lesions seen Psychiatry: Mood normal // no visual hallucinations   Data Reviewed:  Labs reviewed: Na 140, K 3.5, Cr 0.54, WBC 4.3, Hgb 10.5, Plts 240   Family Communication:  Pt in room, family at bedside  Disposition: Status is: Inpatient Remains inpatient appropriate because: Severity of illness  Planned Discharge Destination: Home     Author: Marylu Lund, MD 09/28/2022 5:07 PM  For on call review [email or web address].

## 2022-09-29 DIAGNOSIS — K922 Gastrointestinal hemorrhage, unspecified: Secondary | ICD-10-CM | POA: Diagnosis not present

## 2022-09-29 LAB — COMPREHENSIVE METABOLIC PANEL
ALT: 23 U/L (ref 0–44)
AST: 21 U/L (ref 15–41)
Albumin: 3 g/dL — ABNORMAL LOW (ref 3.5–5.0)
Alkaline Phosphatase: 46 U/L (ref 38–126)
Anion gap: 7 (ref 5–15)
BUN: 9 mg/dL (ref 8–23)
CO2: 28 mmol/L (ref 22–32)
Calcium: 8.8 mg/dL — ABNORMAL LOW (ref 8.9–10.3)
Chloride: 106 mmol/L (ref 98–111)
Creatinine, Ser: 0.55 mg/dL (ref 0.44–1.00)
GFR, Estimated: >60 mL/min (ref >60)
Glucose, Bld: 85 mg/dL (ref 70–99)
Potassium: 3.8 mmol/L (ref 3.5–5.1)
Sodium: 141 mmol/L (ref 135–145)
Total Bilirubin: 0.4 mg/dL (ref 0.3–1.2)
Total Protein: 6 g/dL — ABNORMAL LOW (ref 6.5–8.1)

## 2022-09-29 LAB — CBC
HCT: 32.2 % — ABNORMAL LOW (ref 36.0–46.0)
Hemoglobin: 10 g/dL — ABNORMAL LOW (ref 12.0–15.0)
MCH: 30 pg (ref 26.0–34.0)
MCHC: 31.1 g/dL (ref 30.0–36.0)
MCV: 96.7 fL (ref 80.0–100.0)
Platelets: 242 10*3/uL (ref 150–400)
RBC: 3.33 MIL/uL — ABNORMAL LOW (ref 3.87–5.11)
RDW: 14.6 % (ref 11.5–15.5)
WBC: 3.7 10*3/uL — ABNORMAL LOW (ref 4.0–10.5)
nRBC: 0 % (ref 0.0–0.2)

## 2022-09-29 MED ORDER — MIRTAZAPINE 15 MG PO TABS: 7.5000 mg | ORAL_TABLET | Freq: Every day | ORAL | Status: DC

## 2022-09-29 MED ORDER — MEGESTROL ACETATE 40 MG PO TABS
40.0000 mg | ORAL_TABLET | Freq: Every day | ORAL | Status: DC
  Filled 2022-09-29: qty 1

## 2022-09-29 NOTE — Progress Notes (Signed)
Physical Therapy Treatment Patient Details Name: Kaitlin [name] MRN: [id number] DOB: 01-Mar-1927 Today's Date: 09/29/2022   History of Present Illness Patient is a 88 year old female who presented on 09/19/2022 with  rectal bleeding with bleed suspected from sigmoid diverticular. PMH: a fib, CVA, LBBB, anxiety, esophageal stricture s/p dilation.    PT Comments    Pt ambulated in hallway starting with RW and then without assistive device (states she has been going to bathroom in her room without walker).  Pt was unsteady and required HHA for more support/stability however.  Distance per pt preference.  Would recommend staff continue to use RW for hallway ambulation.    Recommendations for follow up therapy are one component of a multi-disciplinary discharge planning process, led by the attending physician.  Recommendations may be updated based on patient status, additional functional criteria and insurance authorization.  Follow Up Recommendations  Home health PT     Assistance Recommended at Discharge Intermittent Supervision/Assistance  Patient can return home with the following Assist for transportation;Assistance with cooking/housework;Help with stairs or ramp for entrance   Equipment Recommendations  BSC/3in1 (RW if son unable to find one)    Recommendations for Other Services       Precautions / Restrictions Precautions Precautions: Fall     Mobility  Bed Mobility Overal bed mobility: Needs Assistance Bed Mobility: Supine to Sit, Sit to Supine     Supine to sit: Supervision Sit to supine: Supervision        Transfers Overall transfer level: Needs assistance Equipment used: Rolling walker (2 wheels) Transfers: Sit to/from Stand Sit to Stand: Min guard           General transfer comment: min/guard for safety    Ambulation/Gait Ambulation/Gait assistance: Min guard, Min assist Gait Distance (Feet): 120 Feet Assistive device: Rolling walker (2  wheels) Gait Pattern/deviations: Step-through pattern, Trunk flexed, Knee flexed in stance - right, Knee flexed in stance - left, Decreased stride length Gait velocity: decr     General Gait Details: initially started with RW for approx 40 feet and then pt wished to try without; ambulated another 120 ft however pt did request HHA, also was needed for stability; pt reports mild dyspnea upon return to room; SPO2 100% on room air; HR 50 bpm on dynamap however pt reports hx of afib.   Stairs             Wheelchair Mobility    Modified Rankin (Stroke Patients Only)       Balance                                            Cognition Arousal/Alertness: Awake/alert Behavior During Therapy: WFL for tasks assessed/performed Overall Cognitive Status: Within Functional Limits for tasks assessed                                          Exercises      General Comments        Pertinent Vitals/Pain Pain Assessment Pain Assessment: Faces Faces Pain Scale: Hurts little more Pain Location: chronic back pain Pain Descriptors / Indicators: Sore, Grimacing Pain Intervention(s): Repositioned, Monitored during session, Limited activity within patient's tolerance    Home Living  Prior Function            PT Goals (current goals can now be found in the care plan section) Progress towards PT goals: Progressing toward goals    Frequency    Min 3X/week      PT Plan Current plan remains appropriate    Co-evaluation              AM-PAC PT "6 Clicks" Mobility   Outcome Measure  Help needed turning from your back to your side while in a flat bed without using bedrails?: A Little Help needed moving from lying on your back to sitting on the side of a flat bed without using bedrails?: A Little Help needed moving to and from a bed to a chair (including a wheelchair)?: A Little Help needed standing up  from a chair using your arms (e.g., wheelchair or bedside chair)?: A Little Help needed to walk in hospital room?: A Little Help needed climbing 3-5 steps with a railing? : A Little 6 Click Score: 18    End of Session Equipment Utilized During Treatment: Gait belt Activity Tolerance: Patient tolerated treatment well Patient left: in bed;with call bell/phone within reach;with family/visitor present Nurse Communication: Mobility status PT Visit Diagnosis: Other abnormalities of gait and mobility (R26.89);Difficulty in walking, not elsewhere classified (R26.2)     Time: 6468-0321 PT Time Calculation (min) (ACUTE ONLY): 14 min  Charges:  $Gait Training: 8-22 mins                     Jannette Spanner PT, DPT Physical Therapist Acute Rehabilitation Services Preferred contact method: Secure Chat Weekend Pager Only: [phone] Office: [place] 09/29/2022, 4:03 PM

## 2022-09-29 NOTE — Progress Notes (Signed)
Progress Note   Patient: Kaitlin [name] EXH:[id number] DOB: 1927-08-23 DOA: 09/18/2022     10 DOS: the patient was seen and examined on 09/29/2022   Brief hospital course: 88 year old F with PMH of persistent A-fib on Eliquis, CVA, LBBB, anxiety, esophageal stricture s/p dilation and diverticulosis presenting with acute rectal bleed that started the day of presentation, and admitted for acute blood loss anemia due to rectal bleed.  Hgb 10.6 but dropped further to 7.1.  Received Kcentra in ED.  She was transfused 3 units.  CT angio without extravasation but diverticulosis.  [facility] consulted.   EGD showed tortuous esophagus otherwise normal. Flexible sig blood in the rectum and in the sigmoid colon, severe diverticulosis in sigmoid colon and narrowing of colon in association with the diverticular opening.  Bleeding seems to have subsided.  Transfused additional 1 unit on 12/10.  H&H stable.  Tolerating soft diet.  GI signed off.   Patient has urinary tract infection.  Urine culture with pansensitive Klebsiella oxytoca and E. Coli.  She received IV ceftriaxone for 4 days followed by p.o. cefadroxil for 1 day.  However, she had acute on chronic back pain, right flank pain and mild temp. lumbar x-ray showed moderate to severe multilevel DDD.  CT chest/abdomen/pelvis with contrast showed possible right nephric abscesses versus infarction and slightly enlarged RUE nodule measuring 16 x 9 mm (from 7 x 8 mm in 2020).  Antibiotics broadened to IV Zosyn.  Infectious disease consulted.  Antibiotics de-escalated to IV ceftriaxone.  Assessment and Plan: Acute right pyonephritis: Patient was treated for UTI with appropriate antibiotics for 5 days as below.  Despite that, she had acute on chronic back pain, right flank pain and mild temp to 99.34F.  No leukocytosis but WBC went up to 9.1.  CT concerning for right nephric abscesses versus infarction.  Blood cultures NGTD. -Appreciate input by ID.   De-escalated antibiotics to IV ceftriaxone. -Plan to continue rocephin per ID recs   UTI due to pansensitive Klebsiella oxytoca and E. coli.  Received IV ceftriaxone for 4 days followed by p.o. cefadroxil for 1 day -Continue abx as per above   Acute blood loss anemia due to rectal bleed: Likely diverticular.  CT did not show extravasation.  Received Kcentra in ED.  EGD negative except for torturous esophagus.  Flex sig with blood in rectum and sigmoid colon and  severe sigmoid diverticulosis.  Bleeding seems to have subsided.  Received 4 units. -Cardiology recommended holding Eliquis until outpatient follow-up in February -Upgraded to regular diet at the patient's and family request although SLP recommended dysphagia 3 diet -GI signed off.   Persistent A-fib with mild RVR: Not on rate or rhythm control.  On low-dose Eliquis POA.  CHA2DS2-VASc score 7 but she has significant risk for bleeding.  Cardiology recommended holding Eliquis until outpatient follow-up in February. -IV metoprolol 2.5 mg as needed for sustained HR > 120. -Dr. Cyndia Skeeters discussed cardiology recommendation with patient and family at bedside.   Right upper lobe nodule: CT chest on 12/13 showed RUL solid density measuring 16 x 9 mm (from 7 x 8 mm in 2020).  Radiology recommended PET scan outpatient.  Dr. Cyndia Skeeters discussed with patient's daughter, granddaughter and son.  Given age, frailty and slow progression, family not interested in pursuing evaluation or treatment for this.   Anxiety and depression: On low-dose Xanax as needed at home -Home Xanax 0.25 mg every 8 hours as needed   Acute urinary retention: Had about 730  cc urine output after Foley insertion.  Passed voiding trial.   Intractable acute on chronic back pain: No radiculopathy.  Patient is on [place] for chronic back pain.  Lumbar film shows moderate to severe multilevel DDD.  CT concerning for right kidney infection. -Pain is better controlled with scheduled Tylenol and  as needed oxycodone. -MiraLAX for constipation.  Would avoid senna given diverticular bleed. -Manage kidney infection as above -OOB/PT/OT   Possible dysphagia/history of esophageal stricture s/p dilation: Noted to have choking while eating soft diet. -SLP recommended dysphagia 3 diet but patient and family would like to try regular diet so she can have vegetables -Aspiration precaution   Abdominal pain/nausea: Seems to have resolved.   Constipation: Could be due to opiate.  Seems to have resolved. -Continue MiraLAX -Would avoid senna given diverticular bleed.   Goal of care counseling: DNR/DNI. -See IPAL note by Dr. Cyndia Skeeters   History of CVA: Stable. History of LBBB: Stable   Underweight/unintentional weight loss: Reports about 9 pounds weight loss in the last 8 months. Body mass index is 14.33 kg/m. Nutrition Problem: Increased nutrient needs Etiology: acute illness Signs/Symptoms: estimated needs Interventions: Ensure Enlive (each supplement provides 350kcal and 20 grams of protein), MVI     Subjective: Not having much appetite today  Physical Exam: Vitals:   09/28/22 0449 09/28/22 1257 09/28/22 2048 09/29/22 1324  BP: 135/74 112/68 (!) 156/74 (!) 122/54  Pulse: 84 (!) 58 76 67  Resp: '18 18  18  '$ Temp: 97.7 F (36.5 C) 98 F (36.7 C) 97.7 F (36.5 C) 97.7 F (36.5 C)  TempSrc: Oral Oral Oral   SpO2: 96% 100% 99% 100%  Weight:      Height:       General exam: Conversant, in no acute distress Respiratory system: normal chest rise, clear, no audible wheezing Cardiovascular system: regular rhythm, s1-s2 Gastrointestinal system: Nondistended, nontender, pos BS Central nervous system: No seizures, no tremors Extremities: No cyanosis, no joint deformities Skin: No rashes, no pallor Psychiatry: Affect normal // no auditory hallucinations   Data Reviewed:  Labs reviewed: Na 141, K 3.8, Cr 0.55, WBC 3.7, Hgb 10.0, Plts 242   Family Communication: Pt in room, family  at bedside  Disposition: Status is: Inpatient Remains inpatient appropriate because: Severity of illness  Planned Discharge Destination: Home     Author: Marylu Lund, MD 09/29/2022 2:24 PM  For on call review [email or web address].

## 2022-09-30 ENCOUNTER — Other Ambulatory Visit: Payer: Self-pay | Admitting: Internal Medicine

## 2022-09-30 DIAGNOSIS — K922 Gastrointestinal hemorrhage, unspecified: Secondary | ICD-10-CM | POA: Diagnosis not present

## 2022-09-30 DIAGNOSIS — N151 Renal and perinephric abscess: Secondary | ICD-10-CM

## 2022-09-30 LAB — CULTURE, BLOOD (ROUTINE X 2)
Culture: NO GROWTH
Culture: NO GROWTH
Special Requests: ADEQUATE
Special Requests: ADEQUATE

## 2022-09-30 LAB — COMPREHENSIVE METABOLIC PANEL
ALT: 21 U/L (ref 0–44)
AST: 22 U/L (ref 15–41)
Albumin: 3.3 g/dL — ABNORMAL LOW (ref 3.5–5.0)
Alkaline Phosphatase: 50 U/L (ref 38–126)
Anion gap: 9 (ref 5–15)
BUN: 9 mg/dL (ref 8–23)
CO2: 26 mmol/L (ref 22–32)
Calcium: 8.7 mg/dL — ABNORMAL LOW (ref 8.9–10.3)
Chloride: 104 mmol/L (ref 98–111)
Creatinine, Ser: 0.59 mg/dL (ref 0.44–1.00)
GFR, Estimated: >60 mL/min (ref >60)
Glucose, Bld: 83 mg/dL (ref 70–99)
Potassium: 3.8 mmol/L (ref 3.5–5.1)
Sodium: 139 mmol/L (ref 135–145)
Total Bilirubin: 0.5 mg/dL (ref 0.3–1.2)
Total Protein: 6.3 g/dL — ABNORMAL LOW (ref 6.5–8.1)

## 2022-09-30 LAB — CBC
HCT: 32.3 % — ABNORMAL LOW (ref 36.0–46.0)
Hemoglobin: 10.2 g/dL — ABNORMAL LOW (ref 12.0–15.0)
MCH: 30.3 pg (ref 26.0–34.0)
MCHC: 31.6 g/dL (ref 30.0–36.0)
MCV: 95.8 fL (ref 80.0–100.0)
Platelets: 250 10*3/uL (ref 150–400)
RBC: 3.37 MIL/uL — ABNORMAL LOW (ref 3.87–5.11)
RDW: 14.6 % (ref 11.5–15.5)
WBC: 4.1 10*3/uL (ref 4.0–10.5)
nRBC: 0 % (ref 0.0–0.2)

## 2022-09-30 MED ORDER — CEPHALEXIN 250 MG/5ML PO SUSR: 500.0000 mg | Freq: Two times a day (BID) | ORAL | 0 refills | Status: DC

## 2022-09-30 MED ORDER — ADULT MULTIVITAMIN LIQUID CH: 15.0000 mL | Freq: Every day | ORAL | 0 refills | Status: DC

## 2022-09-30 MED ORDER — CEPHALEXIN 250 MG/5ML PO SUSR
500.0000 mg | Freq: Two times a day (BID) | ORAL | Status: DC
  Filled 2022-09-30: qty 10

## 2022-09-30 MED ORDER — MIRTAZAPINE 7.5 MG PO TABS: 7.5000 mg | ORAL_TABLET | Freq: Every day | ORAL | 0 refills | Status: DC

## 2022-09-30 NOTE — Progress Notes (Addendum)
[facility]  Date of Admission:  09/18/2022   Total days of inpatient antibiotics 9  Principal Problem:   Acute GI bleeding Active Problems:   ANXIETY DEPRESSION   Diverticulosis of colon with hemorrhage   Chronic back pain   Osteoarthritis   Tachy-brady syndrome (HCC)   Persistent atrial fibrillation (HCC)   History of stroke   Multiple lung nodules on CT   Acute blood loss anemia   Underweight   Intractable low back pain   Acute pyonephrosis   Goals of care, counseling/discussion   DNR (do not resuscitate)          Assessment: 75 YF with  #UTI in the setting of renal abscesses -12/13 CT showed multiple small fluid collections in right kidney, possible small abscesses vs infarction -12/9 UA+ Kleb oxytoca(AMP R), Ecoli(pan S) -Pt would prefer liquid PO abx due ot difficulty swallowing pills, will do keflex(cefadroxil unavailable)  Recommendations: -D/C ceftriaxone -Keflex '500mg'$  PO (liquid) bid to complete 4 weeks EOT 1/5 -Follow-up on 1/4 with repeat CT(ordered as outpatient)  # Pulmonary nodule with c/f for malignancy -Increased size of pulmonary nodule with c/f malignancy  -Defer to primary  Microbiology:   Antibiotics: 12/9-12-12, 12/13-p: CTX Piptazo 12/13-12/14 Cultures:   Urine  12/9 Ecoli and klebsiella oxytoca  SUBJECTIVE: Resting in bed. Reports right flank pain is improving. She had more mid lumbar pain overnight Interval:   Review of Systems: Review of Systems  All other systems reviewed and are negative.    Scheduled Meds:  acetaminophen  650 mg Oral Q6H WA   cephALEXin  500 mg Oral Q12H   feeding supplement  237 mL Oral BID BM   lidocaine  1 patch Transdermal Q24H   mirtazapine  7.5 mg Oral QHS   multivitamin  15 mL Oral Daily   sodium chloride flush  3 mL Intravenous Q12H   Continuous Infusions:  sodium chloride 10 mL/hr at 09/27/22 1515   PRN Meds:.sodium chloride, ALPRAZolam, metoprolol  tartrate, ondansetron **OR** ondansetron (ZOFRAN) IV, mouth rinse, oxyCODONE, polyethylene glycol Allergies  Allergen Reactions   Dilaudid [Hydromorphone Hcl] Other (See Comments)    Dropped heart rate really low   Ciprofloxacin Other (See Comments)    Dizziness   Doxycycline     Sever dizziness and nausea   Gabapentin     Loss of balance, "weird feeling"   Codeine Nausea And Vomiting and Rash   Morphine And Related Nausea And Vomiting and Rash   Nizatidine Other (See Comments)    Unknown reaction     OBJECTIVE: Vitals:   09/29/22 1324 09/29/22 2111 09/30/22 0509 09/30/22 1000  BP: (!) 122/54 (!) 114/55 (!) 149/81   Pulse: 67 73 85   Resp: '18 18 18   '$ Temp: 97.7 F (36.5 C) 97.7 F (36.5 C) 97.7 F (36.5 C)   TempSrc:  Oral Oral   SpO2: 100% 96% 99%   Weight:    40.2 kg  Height:       Body mass index is 14.74 kg/m.  Physical Exam Constitutional:      Appearance: Normal appearance.  HENT:     Head: Normocephalic and atraumatic.     Right Ear: Tympanic membrane normal.     Left Ear: Tympanic membrane normal.     Nose: Nose normal.     Mouth/Throat:     Mouth: Mucous membranes are moist.  Eyes:     Extraocular Movements: Extraocular movements intact.  Conjunctiva/sclera: Conjunctivae normal.     Pupils: Pupils are equal, round, and reactive to light.  Cardiovascular:     Rate and Rhythm: Normal rate and regular rhythm.     Heart sounds: No murmur heard.    No friction rub. No gallop.  Pulmonary:     Effort: Pulmonary effort is normal.     Breath sounds: Normal breath sounds.  Abdominal:     General: Abdomen is flat.     Palpations: Abdomen is soft.     Comments: Right flank some tenderness to palpation  Musculoskeletal:        General: Normal range of motion.  Skin:    General: Skin is warm and dry.  Neurological:     General: No focal deficit present.     Mental Status: She is alert and oriented to person, place, and time.  Psychiatric:        Mood  and Affect: Mood normal.       Lab Results Lab Results  Component Value Date   WBC 4.1 09/30/2022   HGB 10.2 (L) 09/30/2022   HCT 32.3 (L) 09/30/2022   MCV 95.8 09/30/2022   PLT 250 09/30/2022    Lab Results  Component Value Date   CREATININE 0.59 09/30/2022   BUN 9 09/30/2022   NA 139 09/30/2022   K 3.8 09/30/2022   CL 104 09/30/2022   CO2 26 09/30/2022    Lab Results  Component Value Date   ALT 21 09/30/2022   AST 22 09/30/2022   ALKPHOS 50 09/30/2022   BILITOT 0.5 09/30/2022        Laurice Record, [facility] 09/30/2022, 11:11 AM

## 2022-09-30 NOTE — Discharge Summary (Signed)
Physician Discharge Summary   Patient: Kaitlin [name] MRN: [id number] DOB: 1927/09/17  Admit date:     09/18/2022  Discharge date: 09/30/22  Discharge Physician: Marylu Lund   PCP: [name], MD   Recommendations at discharge:    Follow up with PCP in 1-2 weeks Follow up with ID regarding outpatient CT scan  Discharge Diagnoses: Principal Problem:   Acute GI bleeding Active Problems:   Persistent atrial fibrillation (HCC)   ANXIETY DEPRESSION   Diverticulosis of colon with hemorrhage   Chronic back pain   Osteoarthritis   Tachy-brady syndrome (HCC)   History of stroke   Multiple lung nodules on CT   Acute blood loss anemia   Underweight   Intractable low back pain   Acute pyonephrosis   Goals of care, counseling/discussion   DNR (do not resuscitate)  Resolved Problems:   History of CVA (cerebrovascular accident)  Hospital Course: 88 year old F with PMH of persistent A-fib on Eliquis, CVA, LBBB, anxiety, esophageal stricture s/p dilation and diverticulosis presenting with acute rectal bleed that started the day of presentation, and admitted for acute blood loss anemia due to rectal bleed.  Hgb 10.6 but dropped further to 7.1.  Received Kcentra in ED.  She was transfused 3 units.  CT angio without extravasation but diverticulosis.  [facility] consulted.   EGD showed tortuous esophagus otherwise normal. Flexible sig blood in the rectum and in the sigmoid colon, severe diverticulosis in sigmoid colon and narrowing of colon in association with the diverticular opening.  Bleeding seems to have subsided.  Transfused additional 1 unit on 12/10.  H&H stable.  Tolerating soft diet.  GI signed off.   Patient has urinary tract infection.  Urine culture with pansensitive Klebsiella oxytoca and E. Coli.  She received IV ceftriaxone for 4 days followed by p.o. cefadroxil for 1 day.  However, she had acute on chronic back pain, right flank pain and mild temp. lumbar x-ray showed  moderate to severe multilevel DDD.  CT chest/abdomen/pelvis with contrast showed possible right nephric abscesses versus infarction and slightly enlarged RUE nodule measuring 16 x 9 mm (from 7 x 8 mm in 2020).  Antibiotics broadened to IV Zosyn.  Infectious disease consulted.  Antibiotics de-escalated to IV ceftriaxone.  Assessment and Plan: Acute right pyonephritis: Patient was treated for UTI with appropriate antibiotics for 5 days as below.  Despite that, she had acute on chronic back pain, right flank pain and mild temp to 99.88F.  No leukocytosis but WBC went up to 9.1.  CT concerning for right nephric abscesses versus infarction.  Blood cultures NGTD. -Appreciate input by ID.  Antibiotics were continued with IV ceftriaxone. ID recommendations to complete course of cephalexin with EOT of 10/18/22 -ID has arranged follow up abd CT as outpatient   UTI due to pansensitive Klebsiella oxytoca and E. coli.  Initially completed IV ceftriaxone for 4 days followed by p.o. cefadroxil for 1 day -with concerns of renal abscess, antibiotics were resumed per above   Acute blood loss anemia due to rectal bleed: Likely diverticular.  CT did not show extravasation.  Received Kcentra in ED.  EGD negative except for torturous esophagus.  Flex sig with blood in rectum and sigmoid colon and  severe sigmoid diverticulosis.  Bleeding seems to have subsided.  Received 4 units. -Cardiology recommended holding Eliquis until outpatient follow-up in February -Upgraded to regular diet at the patient's and family request although SLP recommended dysphagia 3 diet -GI signed off.   Persistent  A-fib with mild RVR: Not on rate or rhythm control.  On low-dose Eliquis POA.  CHA2DS2-VASc score 7 but she has significant risk for bleeding.  Cardiology recommended holding Eliquis until outpatient follow-up in February. -Dr. Cyndia Skeeters discussed cardiology recommendation with patient and family at bedside.   Right upper lobe nodule: CT chest  on 12/13 showed RUL solid density measuring 16 x 9 mm (from 7 x 8 mm in 2020).  Radiology recommended PET scan outpatient.  Dr. Cyndia Skeeters discussed with patient's daughter, granddaughter and son.  Given age, frailty and slow progression, family not interested in pursuing evaluation or treatment for this.   Anxiety and depression: On low-dose Xanax as needed at home -Home Xanax 0.25 mg every 8 hours as needed   Acute urinary retention: Had about 730 cc urine output after Foley insertion.  Passed voiding trial.   Intractable acute on chronic back pain: No radiculopathy.  Patient is on Ruhenstroth for chronic back pain.  Lumbar film shows moderate to severe multilevel DDD.  CT concerning for right kidney infection. -Pain is better controlled with scheduled Tylenol and as needed oxycodone. -MiraLAX for constipation.  Would avoid senna given diverticular bleed. -Manage kidney infection as above -OOB/PT/OT   Possible dysphagia/history of esophageal stricture s/p dilation: Noted to have choking while eating soft diet. -SLP recommended dysphagia 3 diet but patient and family would like to try regular diet so she can have vegetables -Aspiration precaution   Abdominal pain/nausea: Seems to have resolved.   Constipation: Could be due to opiate.  Seems to have resolved. -Continued MiraLAX while in hospital -Would avoid senna given diverticular bleed.   Goal of care counseling: DNR/DNI. -See IPAL note by Dr. Cyndia Skeeters   History of CVA: Stable. History of LBBB: Stable   Underweight/unintentional weight loss: Reports about 9 pounds weight loss in the last 8 months. Body mass index is 14.33 kg/m. Nutrition Problem: Increased nutrient needs Etiology: acute illness Signs/Symptoms: estimated needs Interventions: Ensure Enlive (each supplement provides 350kcal and 20 grams of protein), MVI       Consultants: ID, GI, Cardiology Procedures performed: Flex sig  Disposition: Relative's home Diet recommendation:   Discharge Diet Orders (From admission, onward)     Start     Ordered   09/24/22 0000  Diet general        09/24/22 0744           Regular diet DISCHARGE MEDICATION: Allergies as of 09/30/2022       Reactions   Dilaudid [hydromorphone Hcl] Other (See Comments)   Dropped heart rate really low   Ciprofloxacin Other (See Comments)   Dizziness   Doxycycline    Sever dizziness and nausea   Gabapentin    Loss of balance, "weird feeling"   Codeine Nausea And Vomiting, Rash   Morphine And Related Nausea And Vomiting, Rash   Nizatidine Other (See Comments)   Unknown reaction         Medication List     STOP taking these medications    Eliquis 2.5 MG Tabs tablet Generic drug: apixaban   polyethylene glycol 17 g packet Commonly known as: MIRALAX / GLYCOLAX Replaced by: polyethylene glycol powder 17 GM/SCOOP powder       TAKE these medications    ALPRAZolam 0.25 MG tablet Commonly known as: XANAX Take 0.125 mg by mouth 3 (three) times daily as needed for anxiety. For sleep   amoxicillin 500 MG tablet Commonly known as: AMOXIL Take 500 mg by mouth as needed (  before dental appointments).   calcium carbonate 500 MG chewable tablet Commonly known as: TUMS - dosed in mg elemental calcium Chew 1 tablet by mouth 2 (two) times daily.   cephALEXin 250 MG/5ML suspension Commonly known as: KEFLEX Take 10 mLs (500 mg total) by mouth every 12 (twelve) hours for 17 days.   cholecalciferol 1000 units tablet Commonly known as: VITAMIN D Take 1,000 Units by mouth daily.   Fish Oil 1000 MG Caps Take 1,000 mg by mouth daily.   HYDROcodone-acetaminophen 5-325 MG tablet Commonly known as: NORCO/VICODIN Take 1 tablet by mouth every 6 (six) hours as needed for moderate pain.   mirtazapine 7.5 MG tablet Commonly known as: REMERON Take 1 tablet (7.5 mg total) by mouth at bedtime.   multivitamin Liqd Take 15 mLs by mouth daily. Start taking on: October 01, 2022    nitroGLYCERIN 0.4 MG SL tablet Commonly known as: NITROSTAT Place 0.4 mg under the tongue every 5 (five) minutes as needed for chest pain.   polyethylene glycol powder 17 GM/SCOOP powder Commonly known as: MiraLax Take 17 g by mouth 2 (two) times daily as needed for mild constipation. Replaces: polyethylene glycol 17 g packet   sennosides-docusate sodium 8.6-50 MG tablet Commonly known as: SENOKOT-S Take 1-2 tablets by mouth daily as needed for constipation.   Super B Complex Tabs Take 1 tablet by mouth daily.               Durable Medical Equipment  (From admission, onward)           Start     Ordered   09/24/22 1212  For home use only DME 3 n 1  Once       Comments: Patient is not able to walk the distance required to go the bathroom, or he/she is unable to safely negotiate stairs required to access the bathroom.  A 3in1 BSC will alleviate this problem   09/24/22 1211   09/23/22 1340  For home use only DME Walker rolling  Once       Question Answer Comment  Walker: With [place]   Patient needs a walker to treat with the following condition Generalized weakness      09/23/22 1339            Follow-up Information     Almyra Deforest, PA Follow up on 11/29/2022.   Specialties: Cardiology, Radiology Why: 10:30AM. Cardiology follow up in [facility]. Please note this follow up is in our [facility], on the second floor above [facility] information: [address] [place] [address] [phone]         [name], MD. Schedule an appointment as soon as possible for a visit in 1 week(s).   Specialty: Internal Medicine Contact information: [address] [place] [address] [facility] Follow up.   Why: to provide home physical therapy visits        Laurice Record, MD Follow up.   Specialty: Infectious Diseases Why: as scheduled Contact  information: [address], [place] [address] [phone]                Discharge Exam: Danley Danker Weights   09/18/22 2128 09/30/22 1000  Weight: 39.1 kg 40.2 kg   General exam: Awake, laying in bed, in nad Respiratory system: Normal respiratory effort, no wheezing Cardiovascular system: regular rate,  s1, s2 Gastrointestinal system: Soft, nondistended, positive BS Central nervous system: CN2-12 grossly intact, strength intact Extremities: Perfused, no clubbing Skin: Normal skin turgor, no notable skin lesions seen Psychiatry: Mood normal // no visual hallucinations   Condition at discharge: fair  The results of significant diagnostics from this hospitalization (including imaging, microbiology, ancillary and laboratory) are listed below for reference.   Imaging Studies: CT CHEST ABDOMEN PELVIS W CONTRAST  Result Date: 09/25/2022 CLINICAL DATA:  Severe back pain, fever. EXAM: CT CHEST, ABDOMEN, AND PELVIS WITH CONTRAST TECHNIQUE: Multidetector CT imaging of the chest, abdomen and pelvis was performed following the standard protocol during bolus administration of intravenous contrast. RADIATION DOSE REDUCTION: This exam was performed according to the departmental dose-optimization program which includes automated exposure control, adjustment of the mA and/or kV according to patient size and/or use of iterative reconstruction technique. CONTRAST:  85m OMNIPAQUE IOHEXOL 300 MG/ML  SOLN COMPARISON:  August 02, 2019.  September 18, 2022. FINDINGS: CT CHEST FINDINGS Cardiovascular: Atherosclerosis of thoracic aorta is noted without aneurysm or dissection. Mild cardiomegaly. No pericardial effusion. Mediastinum/Nodes: No enlarged mediastinal, hilar, or axillary lymph nodes. Thyroid gland, trachea, and esophagus demonstrate no significant findings. Lungs/Pleura: Small bilateral pleural effusions are noted with minimal adjacent subsegmental atelectasis. No pneumothorax is  noted. Stable biapical scarring is noted. 16 x 9 mm part solid density is noted in the right upper lobe best seen on image number 33 of series 4. This appears to be significantly enlarged compared to prior exam. This includes a 7 mm solid nodule. This is highly concerning for malignancy. Musculoskeletal: No chest wall mass or suspicious bone lesions identified. CT ABDOMEN PELVIS FINDINGS Hepatobiliary: Status post cholecystectomy. Mild intrahepatic and extrahepatic biliary dilatation is noted most consistent with post cholecystectomy status. No hepatic abnormality is noted. Pancreas: Stable pancreatic ductal dilatation is noted, but no inflammation is noted. Spleen: Mild splenomegaly. Adrenals/Urinary Tract: Adrenal glands appear normal. Decreased enhancement of lower pole of right kidney is again noted, with the interval development of multiple small fluid collections in this area. It is uncertain if this represents infarction or potentially infection with multiple small abscesses. No hydronephrosis or renal obstruction is noted. Urinary bladder is unremarkable. Stomach/Bowel: Stomach is unremarkable. There is no evidence of bowel obstruction or inflammation. Extensive sigmoid diverticulosis is noted. Vascular/Lymphatic: Aortic atherosclerosis. No enlarged abdominal or pelvic lymph nodes. Reproductive: Status post hysterectomy. No adnexal masses. Other: No abdominal wall hernia or abnormality. No abdominopelvic ascites. Musculoskeletal: No acute or significant osseous findings. IMPRESSION: 16 x 9 mm part solid density is noted in right upper lobe which is significantly enlarged compared to prior exam of 2020 where it measured 7 x 8 mm. 7 mm solid nodule is also seen within this abnormality which is increased compared to prior exam. Given the significant increase in size, this is highly concerning for malignancy. Further evaluation with PET scan is recommended. Small bilateral pleural effusions are noted with  minimal adjacent subsegmental atelectasis. Decreased enhancement of lower pole of right kidney is again noted, with interval development of multiple small fluid collections within this portion of the kidney. It is uncertain if this represents progressive infarction or potentially infection with multiple small abscesses. Extensive sigmoid diverticulosis is noted without inflammation. Aortic Atherosclerosis (ICD10-I70.0). Electronically Signed   By: JMarijo ConceptionM.D.   On: 09/25/2022 16:49   DG Lumbar Spine 2-3 Views  Result Date: 09/25/2022 CLINICAL DATA:  Back pain EXAM: LUMBAR SPINE - 2-3 VIEW COMPARISON:  None Available. FINDINGS:  Dextroconvex upper and levoconvex lower curvature of the lumbar spine. No evidence of lumbar spine fracture. There is moderate severe multilevel degenerative disc disease and with asymmetric left disc height loss at L1-L2 and L2-L3 and asymmetric right disc height loss at L3-L4 and L4-L5. Severe disc height loss at L5-S1. There is moderate-severe lower lumbar predominant facet arthropathy. Trace degenerative anterolisthesis at L4-L5 and retrolisthesis at L5-S1. IMPRESSION: Moderate-severe multilevel degenerative disc disease and lower lumbar predominant facet arthropathy. Trace degenerative anterolisthesis at L4-L5 and retrolisthesis at L5-S1. Electronically Signed   By: Maurine Simmering M.D.   On: 09/25/2022 11:51   DG Abd Portable 1V  Result Date: 09/24/2022 CLINICAL DATA:  Abdominal pain EXAM: PORTABLE ABDOMEN - 1 VIEW COMPARISON:  None Available. FINDINGS: No dilated large or small bowel. Gas and stool in the rectum. Surgical clips in the RIGHT abdomen suggest gallbladder surgery. No pathologic calcifications. Degenerative changes of the spine. IMPRESSION: No acute findings Electronically Signed   By: Suzy Bouchard M.D.   On: 09/24/2022 10:49   CT ANGIO GI BLEED  Result Date: 09/18/2022 CLINICAL DATA:  Lower GI bleed, rectal bleeding. EXAM: CTA ABDOMEN AND PELVIS  WITHOUT AND WITH CONTRAST TECHNIQUE: Multidetector CT imaging of the abdomen and pelvis was performed using the standard protocol during bolus administration of intravenous contrast. Multiplanar reconstructed images and MIPs were obtained and reviewed to evaluate the vascular anatomy. RADIATION DOSE REDUCTION: This exam was performed according to the departmental dose-optimization program which includes automated exposure control, adjustment of the mA and/or kV according to patient size and/or use of iterative reconstruction technique. CONTRAST:  34m OMNIPAQUE IOHEXOL 350 MG/ML SOLN COMPARISON:  08/02/2019. FINDINGS: VASCULAR Aorta: Aortic atherosclerosis. Normal caliber aorta without aneurysm, dissection, vasculitis or significant stenosis. Celiac: Patent without evidence of aneurysm, dissection, vasculitis or significant stenosis. 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 SMA: Patent without evidence of aneurysm, dissection, vasculitis or significant stenosis. Renals: The right renal artery is within normal limits. An accessory renal artery is noted on the left. The left renal artery and accessory artery are diminutive. There is an area of decreased enhancement of the mid left renal artery IMA: Patent without evidence of aneurysm, dissection, vasculitis or significant stenosis. Inflow: Patent without evidence of aneurysm, dissection, vasculitis or significant stenosis. Proximal Outflow: Bilateral common femoral and visualized portions of the superficial and profunda femoral arteries are patent without evidence of aneurysm, dissection, vasculitis or significant stenosis. Veins: No obvious venous abnormality within the limitations of this arterial phase study. Review of the MIP images confirms the above findings. NON-VASCULAR Lower chest: The heart is enlarged and there is a small pericardial effusion. Coronary artery calcifications are noted. Scattered ground-glass nodular opacities  are noted in the right lower lobe, the largest measuring 8 mm, axial image 8. Hepatobiliary: No focal abnormality in the liver. There is intrahepatic and extrahepatic biliary ductal dilatation. The gallbladder is surgically absent. The common bile duct measures 1.2 cm in diameter, not significantly changed from the prior exam. Pancreas: Pancreatic atrophy is noted. There is mild dilatation of the pancreatic duct measuring 5 mm, unchanged from the prior exam. No surrounding inflammatory changes. Spleen: Spleen is enlarged measuring 14.1 cm in length. Adrenals/Urinary Tract: The adrenal glands are within normal limits. A large region of hypoenhancement is in the mid to upper left kidney which is new from the previous exam. A cyst is present in the right kidney. There is a subcentimeter hypodensity in the left kidney which is  too small to further characterize. No renal calculus or hydronephrosis. The bladder is unremarkable. Stomach/Bowel: Stomach is within normal limits. Appendix is not seen. Evaluation of the bowel is limited due to paucity of intra-abdominal fat. No evidence of bowel wall thickening, distention, or inflammatory changes. No free air or pneumatosis. Scattered diverticula are present along the sigmoid colon without evidence of diverticulitis. No acute hemorrhage is seen. Lymphatic: No abdominal or pelvic lymphadenopathy. Reproductive: Status post hysterectomy. No adnexal masses. Other: No abdominopelvic ascites. Musculoskeletal: Degenerative changes in the thoracolumbar spine. No acute osseous abnormality. IMPRESSION: VASCULAR 1. No evidence of active hemorrhage. 2. Accessory left renal artery. The renal arteries on the left are diminutive with severe stenosis of the mid left renal artery. 3. Aortic atherosclerosis. NON-VASCULAR 1. Sigmoid diverticulosis without diverticulitis. No active hemorrhage is identified. 2. Hypoenhancement involving the mid to upper left kidney with severe stenosis of the mid  left renal artery, possible infarct versus infection. 3. Ground-glass nodules in the right lower lobe, the largest measuring 8 mm. Adenocarcinoma can not be excluded. Follow-up CT is recommended in 12 months, with continued annual surveillance for minimum of 3 years. These recommendations are taken from colon recommendations for the management of subsolid pulmonary nodules detected at CT: A statement from the [place]. Radiology 2013; 266:1, [id number]. 4. Status post cholecystectomy with stable dilatation of the biliary ducts and pancreatic duct. Electronically Signed   By: Brett Fairy M.D.   On: 09/18/2022 23:44    Microbiology: Results for orders placed or performed during the hospital encounter of 09/18/22  MRSA Next Gen by PCR, Nasal     Status: None   Collection Time: 09/19/22  3:45 AM   Specimen: Nasal Mucosa; Nasal Swab  Result Value Ref Range Status   MRSA by PCR Next Gen NOT DETECTED NOT DETECTED Final    Comment: (NOTE) The GeneXpert MRSA Assay (FDA approved for NASAL specimens only), is one component of a comprehensive MRSA colonization surveillance program. It is not intended to diagnose MRSA infection nor to guide or monitor treatment for MRSA infections. Test performance is not FDA approved in patients less than 58 years old. Performed at [facility], [place] [address] [place], [place] [address]   Urine Culture     Status: Abnormal   Collection Time: 09/21/22 11:56 AM   Specimen: Urine, Catheterized  Result Value Ref Range Status   Specimen Description   Final    URINE, CATHETERIZED Performed at [facility] [address], [place], [place] [address]    Special Requests   Final    NONE Performed at [facility], [place] [address], [place], [place] [address]    Culture (A)  Final    >=100,000 COLONIES/mL KLEBSIELLA OXYTOCA >=100,000 COLONIES/mL ESCHERICHIA COLI    Report Status 09/24/2022 FINAL  Final    Organism ID, Bacteria KLEBSIELLA OXYTOCA (A)  Final   Organism ID, Bacteria ESCHERICHIA COLI (A)  Final      Susceptibility   Escherichia coli - MIC*    AMPICILLIN 4 SENSITIVE Sensitive     CEFAZOLIN <=4 SENSITIVE Sensitive     CEFEPIME <=0.12 SENSITIVE Sensitive     CEFTRIAXONE <=0.25 SENSITIVE Sensitive     CIPROFLOXACIN <=0.25 SENSITIVE Sensitive     GENTAMICIN <=1 SENSITIVE Sensitive     IMIPENEM <=0.25 SENSITIVE Sensitive     NITROFURANTOIN <=16 SENSITIVE Sensitive     TRIMETH/SULFA <=20 SENSITIVE Sensitive     AMPICILLIN/SULBACTAM <=2 SENSITIVE Sensitive  PIP/TAZO <=4 SENSITIVE Sensitive     * >=100,000 COLONIES/mL ESCHERICHIA COLI   Klebsiella oxytoca - MIC*    AMPICILLIN >=32 RESISTANT Resistant     CEFAZOLIN <=4 SENSITIVE Sensitive     CEFEPIME <=0.12 SENSITIVE Sensitive     CEFTRIAXONE <=0.25 SENSITIVE Sensitive     CIPROFLOXACIN <=0.25 SENSITIVE Sensitive     GENTAMICIN <=1 SENSITIVE Sensitive     IMIPENEM <=0.25 SENSITIVE Sensitive     NITROFURANTOIN 32 SENSITIVE Sensitive     TRIMETH/SULFA <=20 SENSITIVE Sensitive     AMPICILLIN/SULBACTAM 8 SENSITIVE Sensitive     PIP/TAZO <=4 SENSITIVE Sensitive     * >=100,000 COLONIES/mL KLEBSIELLA OXYTOCA  Culture, blood (Routine X 2) w Reflex to ID Panel     Status: None   Collection Time: 09/25/22 12:42 PM   Specimen: BLOOD RIGHT ARM  Result Value Ref Range Status   Specimen Description   Final    BLOOD RIGHT ARM Performed at [facility] [address], [place], [place] [address]    Special Requests   Final    BOTTLES DRAWN AEROBIC ONLY Blood Culture adequate volume Performed at [facility] [address] [place], [place] [address]    Culture   Final    NO GROWTH 5 DAYS Performed at [facility], [place] [address] [place], [place] [address]    Report Status 09/30/2022 FINAL  Final  Culture, blood (Routine X 2) w Reflex to ID Panel     Status: None   Collection Time:  09/25/22 12:42 PM   Specimen: Site Not Specified; Blood  Result Value Ref Range Status   Specimen Description   Final    SITE NOT SPECIFIED BLOOD Performed at [facility], [place] [address], [place], [place] [address]    Special Requests   Final    BOTTLES DRAWN AEROBIC ONLY Blood Culture adequate volume Performed at [facility] [address] [place], [place] [address]    Culture   Final    NO GROWTH 5 DAYS Performed at [facility], [place] [address] [place],  [address]    Report Status 09/30/2022 FINAL  Final    Labs: CBC: Recent Labs  Lab 09/26/22 0509 09/27/22 0539 09/28/22 0525 09/29/22 0525 09/30/22 0521  WBC 5.7 3.8* 4.3 3.7* 4.1  NEUTROABS  --  2.4  --   --   --   HGB 8.9* 9.9* 10.5* 10.0* 10.2*  HCT 28.9* 31.7* 33.9* 32.2* 32.3*  MCV 97.0 97.5 97.4 96.7 95.8  PLT 199 217 240 242 578   Basic Metabolic Panel: Recent Labs  Lab 09/25/22 0456 09/26/22 0509 09/27/22 0539 09/28/22 0525 09/29/22 0525 09/30/22 0521  NA 136 137 144 140 141 139  K 3.9 4.2 3.7 3.5 3.8 3.8  CL 102 102 105 103 106 104  CO2 '28 28 31 29 28 26  '$ GLUCOSE 97 86 90 86 85 83  BUN '9 13 12 12 9 9  '$ CREATININE 0.63 0.79 0.66 0.54 0.55 0.59  CALCIUM 8.0* 8.1* 8.4* 8.6* 8.8* 8.7*  MG 1.8 1.9 2.2 2.0  --   --   PHOS  --  4.0 3.7 2.9  --   --    Liver Function Tests: Recent Labs  Lab 09/24/22 1050 09/25/22 0456 09/26/22 0509 09/27/22 0539 09/28/22 0525 09/29/22 0525 09/30/22 0521  AST 54* 55* 35  --   --  21 22  ALT 29 38 34  --   --  23 21  ALKPHOS 50 44 41  --   --  46 50  BILITOT 0.5 0.5 0.6  --   --  0.4 0.5  PROT 5.9* 5.2* 4.7*  --   --  6.0* 6.3*  ALBUMIN 3.1* 2.5* 2.4* 3.0* 3.1* 3.0* 3.3*   CBG: No results for input(s): "GLUCAP" in the last 168 hours.  Discharge time spent: less than 30 minutes.  Signed: Marylu Lund, MD [facility] 09/30/2022

## 2022-09-30 NOTE — Plan of Care (Signed)

## 2022-09-30 NOTE — Progress Notes (Signed)
CT AP w/o con preferably on 10/15/22.

## 2022-09-30 NOTE — Care Management Important Message (Signed)
Important Message  Patient Details  Name: Kaitlin [name] MRN: [id number] Date of Birth: 1926-11-09   Medicare Important Message Given:  Yes     Memory Argue 09/30/2022, 1:42 PM

## 2022-10-10 DIAGNOSIS — R634 Abnormal weight loss: Secondary | ICD-10-CM | POA: Diagnosis not present

## 2022-10-10 DIAGNOSIS — I4819 Other persistent atrial fibrillation: Secondary | ICD-10-CM | POA: Diagnosis not present

## 2022-10-10 DIAGNOSIS — G894 Chronic pain syndrome: Secondary | ICD-10-CM | POA: Diagnosis not present

## 2022-10-10 DIAGNOSIS — R911 Solitary pulmonary nodule: Secondary | ICD-10-CM | POA: Diagnosis not present

## 2022-10-10 DIAGNOSIS — Z8719 Personal history of other diseases of the digestive system: Secondary | ICD-10-CM | POA: Diagnosis not present

## 2022-10-10 DIAGNOSIS — N151 Renal and perinephric abscess: Secondary | ICD-10-CM | POA: Diagnosis not present

## 2022-10-10 DIAGNOSIS — K224 Dyskinesia of esophagus: Secondary | ICD-10-CM | POA: Diagnosis not present

## 2022-10-10 DIAGNOSIS — R0602 Shortness of breath: Secondary | ICD-10-CM | POA: Diagnosis not present

## 2022-10-15 ENCOUNTER — Telehealth: Payer: Self-pay | Admitting: Internal Medicine

## 2022-10-15 ENCOUNTER — Ambulatory Visit (HOSPITAL_COMMUNITY)
Admission: RE | Admit: 2022-10-15 | Discharge: 2022-10-15 | Disposition: A | Discharge status: Home / Self Care | Payer: Medicare Other | Source: Ambulatory Visit | Attending: Internal Medicine | Admitting: Internal Medicine

## 2022-10-15 DIAGNOSIS — K8689 Other specified diseases of pancreas: Secondary | ICD-10-CM | POA: Diagnosis not present

## 2022-10-15 DIAGNOSIS — K5730 Diverticulosis of large intestine without perforation or abscess without bleeding: Secondary | ICD-10-CM | POA: Diagnosis not present

## 2022-10-15 DIAGNOSIS — N151 Renal and perinephric abscess: Secondary | ICD-10-CM | POA: Diagnosis not present

## 2022-10-15 LAB — POCT I-STAT CREATININE: Creatinine, Ser: 0.6 mg/dL (ref 0.44–1.00)

## 2022-10-15 MED ORDER — SODIUM CHLORIDE (PF) 0.9 % IJ SOLN
INTRAMUSCULAR | Status: AC
  Filled 2022-10-15: qty 50

## 2022-10-15 MED ORDER — IOHEXOL 300 MG/ML  SOLN
80.0000 mL | Freq: Once | INTRAMUSCULAR | Status: AC | PRN
  Administered 2022-10-15: 80 mL via INTRAVENOUS

## 2022-10-15 NOTE — Telephone Encounter (Signed)
Called and spoke with son ok with IV con alone

## 2022-10-17 ENCOUNTER — Encounter: Payer: Self-pay | Admitting: Internal Medicine

## 2022-10-17 ENCOUNTER — Other Ambulatory Visit: Payer: Self-pay

## 2022-10-17 ENCOUNTER — Other Ambulatory Visit: Payer: Self-pay | Admitting: Internal Medicine

## 2022-10-17 ENCOUNTER — Telehealth: Payer: Self-pay

## 2022-10-17 ENCOUNTER — Inpatient Hospital Stay: Payer: Medicare Other | Admitting: Internal Medicine

## 2022-10-17 DIAGNOSIS — R911 Solitary pulmonary nodule: Secondary | ICD-10-CM

## 2022-10-17 MED ORDER — CEPHALEXIN 250 MG/5ML PO SUSR: 500.0000 mg | Freq: Two times a day (BID) | ORAL | 0 refills | Status: AC

## 2022-10-17 NOTE — Progress Notes (Signed)
Appt changed to Monday. - Pt's son requesting CT results, noted not to be available.  -Pt's son requesting video visit. I think video visit is ok to discuss results(1/8), but pt still needs an in-person visit for physical exam and labs. Refilled keflex till video visit on 1/8 -If pt clinically worsens, got to ED.

## 2022-10-17 NOTE — Telephone Encounter (Addendum)
Patient son came in today without the patient stating that she is not feeling well has been off oral antibiotic for 2 days. He stated that he is her power of attorney and wanted results of CT scan and he can call the patient by video on his phone. Notified Dr. Candiss Norse that patient is not in clinic her son showed up and is requesting CT results and wanting her to get a refill on antibiotics because she is not feeling well and thinks its because she has been off antibiotic for 2 days. Provider wants patient to be seen in clinic and she will need lab work, if patients condition gets worse to go to ED. After several times of going back and forth with son, the patient was rescheduled in person on 1/8 if patient is feeling better if not video visit. Provider sent in refill till appointment and CT results are not back yet, patient son was notified.

## 2022-10-21 ENCOUNTER — Encounter: Payer: Self-pay | Admitting: Internal Medicine

## 2022-10-21 ENCOUNTER — Other Ambulatory Visit: Payer: Self-pay

## 2022-10-21 ENCOUNTER — Telehealth (INDEPENDENT_AMBULATORY_CARE_PROVIDER_SITE_OTHER): Payer: Medicare Other | Admitting: Internal Medicine

## 2022-10-21 DIAGNOSIS — N390 Urinary tract infection, site not specified: Secondary | ICD-10-CM | POA: Diagnosis not present
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
## 2022-10-29 ENCOUNTER — Other Ambulatory Visit: Payer: Self-pay

## 2022-10-29 ENCOUNTER — Ambulatory Visit (INDEPENDENT_AMBULATORY_CARE_PROVIDER_SITE_OTHER): Payer: Medicare Other | Admitting: Internal Medicine

## 2022-10-29 ENCOUNTER — Encounter: Payer: Self-pay | Admitting: Internal Medicine

## 2022-10-29 VITALS — BP 151/73 | HR 71 | Temp 98.2°F | Ht 63.0 in | Wt 90.0 lb

## 2022-10-29 DIAGNOSIS — N390 Urinary tract infection, site not specified: Secondary | ICD-10-CM | POA: Diagnosis not present
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
## 2022-10-29 NOTE — Assessment & Plan Note (Signed)
She has been off antibiotics for about 10 days after a recent 4 week course of therapy for UTI in the setting of suspected renal abscesses.  Follow up CT showed resolution of small fluid collections and persistent wedge-shaped hypo-enhancement of the lower pole right kidney that was felt to reflect infarct (less likely resolving pyelonephritis).  For now, do not see further indication for antibiotics at this time.  They were asked to follow up today for repeat lab work off antibiotics so will check a CBC and UA given her reported burning with urination.  Follow up as needed with Dr Candiss Norse.

## 2022-10-30 LAB — URINALYSIS, ROUTINE W REFLEX MICROSCOPIC
Bacteria, UA: NONE SEEN /HPF
Bilirubin Urine: NEGATIVE
Glucose, UA: NEGATIVE
Hyaline Cast: NONE SEEN /LPF
Ketones, ur: NEGATIVE
Leukocytes,Ua: NEGATIVE
Nitrite: NEGATIVE
Specific Gravity, Urine: 1.02 (ref 1.001–1.035)
Squamous Epithelial / HPF: NONE SEEN /HPF (ref <=5)
pH: 6 (ref 5.0–8.0)

## 2022-10-30 LAB — CBC
HCT: 36.6 % (ref 35.0–45.0)
Hemoglobin: 11.9 g/dL (ref 11.7–15.5)
MCH: 28.6 pg (ref 27.0–33.0)
MCHC: 32.5 g/dL (ref 32.0–36.0)
MCV: 88 fL (ref 80.0–100.0)
MPV: 10.6 fL (ref 7.5–12.5)
Platelets: 217 10*3/uL (ref 140–400)
RBC: 4.16 10*6/uL (ref 3.80–5.10)
RDW: 13.8 % (ref 11.0–15.0)
WBC: 4.9 10*3/uL (ref 3.8–10.8)

## 2022-10-30 LAB — MICROSCOPIC MESSAGE

## 2022-11-06 ENCOUNTER — Encounter: Payer: Self-pay | Admitting: Internal Medicine

## 2022-11-06 ENCOUNTER — Ambulatory Visit (INDEPENDENT_AMBULATORY_CARE_PROVIDER_SITE_OTHER): Payer: Medicare Other | Admitting: Internal Medicine

## 2022-11-06 VITALS — BP 118/78 | HR 80 | Ht 63.0 in | Wt 89.0 lb

## 2022-11-06 DIAGNOSIS — K5731 Diverticulosis of large intestine without perforation or abscess with bleeding: Secondary | ICD-10-CM

## 2022-11-06 DIAGNOSIS — D62 Acute posthemorrhagic anemia: Secondary | ICD-10-CM | POA: Diagnosis not present

## 2022-11-06 DIAGNOSIS — K5903 Drug induced constipation: Secondary | ICD-10-CM
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
## 2022-11-06 NOTE — Patient Instructions (Signed)
It is okay to restart your Eliquis if your cardiologist recommends it.  Great to see you today.   I appreciate the opportunity to care for you. Silvano Rusk, MD, [facility]

## 2022-11-13 ENCOUNTER — Encounter (HOSPITAL_BASED_OUTPATIENT_CLINIC_OR_DEPARTMENT_OTHER): Payer: Self-pay | Admitting: Pulmonary Disease

## 2022-11-13 ENCOUNTER — Ambulatory Visit (INDEPENDENT_AMBULATORY_CARE_PROVIDER_SITE_OTHER): Payer: Medicare Other | Admitting: Pulmonary Disease

## 2022-11-13 VITALS — BP 130/80 | HR 63 | Ht 63.0 in | Wt 90.0 lb

## 2022-11-13 DIAGNOSIS — R911 Solitary pulmonary nodule: Secondary | ICD-10-CM | POA: Diagnosis not present

## 2022-11-13 DIAGNOSIS — R0602 Shortness of breath: Secondary | ICD-10-CM | POA: Diagnosis not present

## 2022-11-13 NOTE — Patient Instructions (Signed)
Right lung nodule --ORDER PET/CT  Shortness of breath --ORDER pulmonary function tests  Follow-up with me in 3 weeks. OK to schedule pulmonary function test on separate date
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
## 2022-11-19 ENCOUNTER — Ambulatory Visit (INDEPENDENT_AMBULATORY_CARE_PROVIDER_SITE_OTHER): Payer: Medicare Other | Admitting: Pulmonary Disease

## 2022-11-19 DIAGNOSIS — R0602 Shortness of breath: Secondary | ICD-10-CM

## 2022-11-19 LAB — PULMONARY FUNCTION TEST
DL/VA: 5.27 ml/min/mmHg/L
DLCO cor: 14.91 ml/min/mmHg
DLCO unc: 14.17 ml/min/mmHg
FEF 25-75 Post: 2.15 L/sec
FEF 25-75 Pre: 1.73 L/sec
FEF2575-%Change-Post: 24 %
FEF2575-%Pred-Post: 328 %
FEF2575-%Pred-Pre: 264 %
FEV1-%Change-Post: 3 %
FEV1-%Pred-Post: 105 %
FEV1-%Pred-Pre: 102 %
FEV1-Post: 1.43 L
FEV1-Pre: 1.39 L
FEV1FVC-%Change-Post: 0 %
FEV1FVC-%Pred-Pre: 124 %
FEV6-%Change-Post: 2 %
FEV6-%Pred-Post: 93 %
FEV6-%Pred-Pre: 91 %
FEV6-Post: 1.62 L
FEV6-Pre: 1.58 L
FEV6FVC-%Pred-Post: 108 %
FEV6FVC-%Pred-Pre: 108 %
FVC-%Change-Post: 2 %
FVC-%Pred-Post: 86 %
FVC-%Pred-Pre: 84 %
FVC-Post: 1.62 L
FVC-Pre: 1.58 L
Post FEV1/FVC ratio: 89 %
Post FEV6/FVC ratio: 100 %
Pre FEV1/FVC ratio: 88 %
Pre FEV6/FVC Ratio: 100 %
RV % pred: 146 %
RV: 3.8 L
TLC % pred: 107 %
TLC: 5.25 L

## 2022-11-19 NOTE — Progress Notes (Signed)
Full PFT Performed Today. 

## 2022-11-19 NOTE — Patient Instructions (Signed)
Full PFT Performed Today. 

## 2022-11-20 ENCOUNTER — Encounter (HOSPITAL_BASED_OUTPATIENT_CLINIC_OR_DEPARTMENT_OTHER): Payer: Self-pay | Admitting: Pulmonary Disease

## 2022-11-28 DIAGNOSIS — R911 Solitary pulmonary nodule: Secondary | ICD-10-CM | POA: Diagnosis not present

## 2022-11-28 DIAGNOSIS — G8929 Other chronic pain: Secondary | ICD-10-CM | POA: Diagnosis not present

## 2022-11-28 DIAGNOSIS — R300 Dysuria: Secondary | ICD-10-CM | POA: Diagnosis not present

## 2022-11-29 ENCOUNTER — Ambulatory Visit: Payer: Medicare Other | Attending: Physician Assistant | Admitting: Physician Assistant

## 2022-11-29 ENCOUNTER — Encounter: Payer: Self-pay | Admitting: Physician Assistant

## 2022-11-29 VITALS — BP 130/72 | HR 52 | Wt 89.2 lb

## 2022-11-29 DIAGNOSIS — M79671 Pain in right foot: Secondary | ICD-10-CM | POA: Insufficient documentation

## 2022-11-29 DIAGNOSIS — Z7901 Long term (current) use of anticoagulants: Secondary | ICD-10-CM | POA: Diagnosis not present

## 2022-11-29 DIAGNOSIS — R911 Solitary pulmonary nodule: Secondary | ICD-10-CM | POA: Insufficient documentation

## 2022-11-29 DIAGNOSIS — K922 Gastrointestinal hemorrhage, unspecified: Secondary | ICD-10-CM | POA: Insufficient documentation

## 2022-11-29 DIAGNOSIS — M79672 Pain in left foot: Secondary | ICD-10-CM

## 2022-11-29 DIAGNOSIS — Z8673 Personal history of transient ischemic attack (TIA), and cerebral infarction without residual deficits: Secondary | ICD-10-CM

## 2022-11-29 DIAGNOSIS — I4821 Permanent atrial fibrillation: Secondary | ICD-10-CM

## 2022-11-29 DIAGNOSIS — L819 Disorder of pigmentation, unspecified: Secondary | ICD-10-CM | POA: Diagnosis not present

## 2022-11-29 DIAGNOSIS — I495 Sick sinus syndrome: Secondary | ICD-10-CM | POA: Diagnosis not present

## 2022-11-29 MED ORDER — APIXABAN 2.5 MG PO TABS: 2.5000 mg | ORAL_TABLET | Freq: Two times a day (BID) | ORAL | 3 refills | Status: DC
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
## 2022-11-29 NOTE — Patient Instructions (Signed)
Medication Instructions:  Eliquis 2.58m twice daily *If you need a refill on your cardiac medications before your next appointment, please call your pharmacy*   Lab Work: CBC in 2 weeks If you have labs (blood work) drawn today and your tests are completely normal, you will receive your results only by: [place](if you have [place]) OR A paper copy in the mail If you have any lab test that is abnormal or we need to change your treatment, we will call you to review the results.   Testing/Procedures: Your physician has requested that you have an ankle brachial index (ABI). During this test an ultrasound and blood pressure cuff are used to evaluate the arteries that supply the arms and legs with blood. Allow thirty minutes for this exam. There are no restrictions or special instructions.   Your physician has requested that you have a lower extremity arterial exercise duplex. During this test, exercise and ultrasound are used to evaluate arterial blood flow in the legs. Allow one hour for this exam. There are no restrictions or special instructions.    Follow-Up: At [facility] you and your health needs are our priority.  As part of our continuing mission to provide you with exceptional heart care, we have created designated Provider Care Teams.  These Care Teams include your primary Cardiologist (physician) and Advanced Practice Providers (APPs -  Physician Assistants and Nurse Practitioners) who all work together to provide you with the care you need, when you need it.  We recommend signing up for the patient portal called "MyChart".  Sign up information is provided on this After Visit Summary.  MyChart is used to connect with patients for Virtual Visits (Telemedicine).  Patients are able to view lab/test results, encounter notes, upcoming appointments, etc.  Non-urgent messages can be sent to your provider as well.   To learn more about what you can do with MyChart, go to  [email or web address]    Your next appointment:   3 month(s)  Provider:   Dr PJohney Frame Other Instructions New Cardiologist will be Dr PJohney Frame

## 2022-12-02 ENCOUNTER — Ambulatory Visit (HOSPITAL_COMMUNITY)
Admission: RE | Admit: 2022-12-02 | Discharge: 2022-12-02 | Disposition: A | Discharge status: Home / Self Care | Payer: Medicare Other | Source: Ambulatory Visit | Attending: Pulmonary Disease | Admitting: Pulmonary Disease

## 2022-12-02 DIAGNOSIS — R911 Solitary pulmonary nodule: Secondary | ICD-10-CM | POA: Diagnosis not present

## 2022-12-02 DIAGNOSIS — D7389 Other diseases of spleen: Secondary | ICD-10-CM | POA: Diagnosis not present

## 2022-12-02 DIAGNOSIS — J90 Pleural effusion, not elsewhere classified: Secondary | ICD-10-CM | POA: Diagnosis not present

## 2022-12-02 DIAGNOSIS — I2510 Atherosclerotic heart disease of native coronary artery without angina pectoris: Secondary | ICD-10-CM | POA: Diagnosis not present

## 2022-12-02 DIAGNOSIS — K5730 Diverticulosis of large intestine without perforation or abscess without bleeding: Secondary | ICD-10-CM | POA: Diagnosis not present

## 2022-12-02 LAB — GLUCOSE, CAPILLARY: Glucose-Capillary: 91 mg/dL (ref 70–99)

## 2022-12-02 MED ORDER — FLUDEOXYGLUCOSE F - 18 (FDG) INJECTION
7.9000 | Freq: Once | INTRAVENOUS | Status: AC
  Administered 2022-12-02: 4.94 via INTRAVENOUS

## 2022-12-05 ENCOUNTER — Ambulatory Visit (INDEPENDENT_AMBULATORY_CARE_PROVIDER_SITE_OTHER): Payer: Medicare Other | Admitting: Pulmonary Disease

## 2022-12-05 ENCOUNTER — Encounter (HOSPITAL_BASED_OUTPATIENT_CLINIC_OR_DEPARTMENT_OTHER): Payer: Self-pay | Admitting: Pulmonary Disease

## 2022-12-05 VITALS — BP 120/60 | HR 75 | Ht 63.0 in | Wt 89.6 lb

## 2022-12-05 DIAGNOSIS — R911 Solitary pulmonary nodule: Secondary | ICD-10-CM | POA: Diagnosis not present

## 2022-12-05 DIAGNOSIS — R0602 Shortness of breath: Secondary | ICD-10-CM
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
## 2022-12-05 NOTE — Patient Instructions (Signed)
Right lung nodule  --Mild hypermetabolic activity. Continue surveillance --ORDER CT Chest without contrast in 6 months  Shortness of breath --Reviewed PFT. No evidence of obstructive or restrictive defect --Suspect this is mainly related to deconditioning +/- anxiety --REFER to pulmonary rehab  Follow-up with me in 6 months

## 2022-12-09 ENCOUNTER — Encounter (HOSPITAL_BASED_OUTPATIENT_CLINIC_OR_DEPARTMENT_OTHER): Payer: Self-pay | Admitting: Pulmonary Disease

## 2022-12-09 ENCOUNTER — Telehealth (HOSPITAL_COMMUNITY): Payer: Self-pay

## 2022-12-09 NOTE — Telephone Encounter (Signed)
Received referral from Dr. Loanne Drilling for this pt to participate in Pulmonary Rehab with the diagnosis of Shortness of Breath. Clinical review of pt follow up appt on 12/05/22 Pulmonary office note. Pt appropriate for scheduling for Pulmonary rehab. Will forward to support staff for scheduling and verification of insurance eligibility/benefits with pt consent.   Janine Ores, RN, BSN Cardiac and Pulmonary Rehab

## 2022-12-10 ENCOUNTER — Ambulatory Visit (HOSPITAL_COMMUNITY)
Admission: RE | Admit: 2022-12-10 | Discharge: 2022-12-10 | Disposition: A | Discharge status: Home / Self Care | Payer: Medicare Other | Source: Ambulatory Visit | Attending: Physician Assistant | Admitting: Physician Assistant

## 2022-12-10 DIAGNOSIS — M79672 Pain in left foot: Secondary | ICD-10-CM | POA: Diagnosis not present

## 2022-12-10 DIAGNOSIS — L819 Disorder of pigmentation, unspecified: Secondary | ICD-10-CM | POA: Insufficient documentation

## 2022-12-10 DIAGNOSIS — M79671 Pain in right foot: Secondary | ICD-10-CM | POA: Diagnosis not present

## 2022-12-10 LAB — VAS US ABI WITH/WO TBI
Left ABI: 1.16
Right ABI: 1.16

## 2022-12-10 NOTE — Progress Notes (Signed)
No sign of significant vascular disease. Would recommend conservative management of purple feet. Warm stocking during cold weather

## 2022-12-11 DIAGNOSIS — H353132 Nonexudative age-related macular degeneration, bilateral, intermediate dry stage: Secondary | ICD-10-CM | POA: Diagnosis not present

## 2022-12-11 DIAGNOSIS — H04123 Dry eye syndrome of bilateral lacrimal glands: Secondary | ICD-10-CM | POA: Diagnosis not present

## 2022-12-11 DIAGNOSIS — H40023 Open angle with borderline findings, high risk, bilateral: Secondary | ICD-10-CM | POA: Diagnosis not present

## 2022-12-11 DIAGNOSIS — H532 Diplopia: Secondary | ICD-10-CM | POA: Diagnosis not present

## 2022-12-13 DIAGNOSIS — I4821 Permanent atrial fibrillation: Secondary | ICD-10-CM | POA: Diagnosis not present

## 2022-12-13 DIAGNOSIS — Z7901 Long term (current) use of anticoagulants: Secondary | ICD-10-CM | POA: Diagnosis not present

## 2022-12-13 LAB — CBC
Hematocrit: 39.5 % (ref 34.0–46.6)
Hemoglobin: 12.4 g/dL (ref 11.1–15.9)
MCH: 25.5 pg — ABNORMAL LOW (ref 26.6–33.0)
MCHC: 31.4 g/dL — ABNORMAL LOW (ref 31.5–35.7)
MCV: 81 fL (ref 79–97)
Platelets: 205 10*3/uL (ref 150–450)
RBC: 4.86 x10E6/uL (ref 3.77–5.28)
RDW: 13.6 % (ref 11.7–15.4)
WBC: 4.9 10*3/uL (ref 3.4–10.8)

## 2023-01-15 DIAGNOSIS — R300 Dysuria: Secondary | ICD-10-CM | POA: Diagnosis not present

## 2023-01-15 DIAGNOSIS — M545 Low back pain, unspecified: Secondary | ICD-10-CM | POA: Diagnosis not present

## 2023-01-15 DIAGNOSIS — K5909 Other constipation: Secondary | ICD-10-CM | POA: Diagnosis not present

## 2023-01-15 DIAGNOSIS — G8929 Other chronic pain: Secondary | ICD-10-CM | POA: Diagnosis not present

## 2023-01-21 ENCOUNTER — Telehealth (HOSPITAL_BASED_OUTPATIENT_CLINIC_OR_DEPARTMENT_OTHER): Payer: Self-pay | Admitting: Pulmonary Disease

## 2023-01-21 ENCOUNTER — Emergency Department (HOSPITAL_COMMUNITY): Payer: Medicare Other

## 2023-01-21 ENCOUNTER — Emergency Department (HOSPITAL_COMMUNITY)
Admission: EM | Admit: 2023-01-21 | Discharge: 2023-01-21 | Disposition: A | Discharge status: Home / Self Care | Payer: Medicare Other | Attending: Emergency Medicine | Admitting: Emergency Medicine

## 2023-01-21 ENCOUNTER — Other Ambulatory Visit: Payer: Self-pay

## 2023-01-21 DIAGNOSIS — J984 Other disorders of lung: Secondary | ICD-10-CM | POA: Diagnosis not present

## 2023-01-21 DIAGNOSIS — I700 Atherosclerosis of aorta: Secondary | ICD-10-CM | POA: Insufficient documentation

## 2023-01-21 DIAGNOSIS — W19XXXA Unspecified fall, initial encounter: Secondary | ICD-10-CM | POA: Insufficient documentation

## 2023-01-21 DIAGNOSIS — R252 Cramp and spasm: Secondary | ICD-10-CM | POA: Diagnosis not present

## 2023-01-21 DIAGNOSIS — S3210XA Unspecified fracture of sacrum, initial encounter for closed fracture: Secondary | ICD-10-CM | POA: Insufficient documentation

## 2023-01-21 DIAGNOSIS — Z7901 Long term (current) use of anticoagulants: Secondary | ICD-10-CM | POA: Insufficient documentation

## 2023-01-21 DIAGNOSIS — R102 Pelvic and perineal pain: Secondary | ICD-10-CM | POA: Diagnosis not present

## 2023-01-21 DIAGNOSIS — S0003XA Contusion of scalp, initial encounter: Secondary | ICD-10-CM | POA: Diagnosis not present

## 2023-01-21 DIAGNOSIS — S3992XA Unspecified injury of lower back, initial encounter: Secondary | ICD-10-CM | POA: Diagnosis present

## 2023-01-21 DIAGNOSIS — S0990XA Unspecified injury of head, initial encounter: Secondary | ICD-10-CM | POA: Diagnosis not present

## 2023-01-21 DIAGNOSIS — S199XXA Unspecified injury of neck, initial encounter: Secondary | ICD-10-CM | POA: Diagnosis not present

## 2023-01-21 DIAGNOSIS — R911 Solitary pulmonary nodule: Secondary | ICD-10-CM

## 2023-01-21 LAB — CBC WITH DIFFERENTIAL/PLATELET
Abs Immature Granulocytes: 0.02 10*3/uL (ref 0.00–0.07)
Basophils Absolute: 0 10*3/uL (ref 0.0–0.1)
Basophils Relative: 0 %
Eosinophils Absolute: 0 10*3/uL (ref 0.0–0.5)
Eosinophils Relative: 0 %
HCT: 38.6 % (ref 36.0–46.0)
Hemoglobin: 11.7 g/dL — ABNORMAL LOW (ref 12.0–15.0)
Immature Granulocytes: 0 %
Lymphocytes Relative: 12 %
Lymphs Abs: 0.8 10*3/uL (ref 0.7–4.0)
MCH: 25.4 pg — ABNORMAL LOW (ref 26.0–34.0)
MCHC: 30.3 g/dL (ref 30.0–36.0)
MCV: 83.7 fL (ref 80.0–100.0)
Monocytes Absolute: 0.7 10*3/uL (ref 0.1–1.0)
Monocytes Relative: 10 %
Neutro Abs: 5.3 10*3/uL (ref 1.7–7.7)
Neutrophils Relative %: 78 %
Platelets: 185 10*3/uL (ref 150–400)
RBC: 4.61 MIL/uL (ref 3.87–5.11)
RDW: 16.4 % — ABNORMAL HIGH (ref 11.5–15.5)
WBC: 6.8 10*3/uL (ref 4.0–10.5)
nRBC: 0 % (ref 0.0–0.2)

## 2023-01-21 LAB — URINALYSIS, W/ REFLEX TO CULTURE (INFECTION SUSPECTED)
Bacteria, UA: NONE SEEN
Bilirubin Urine: NEGATIVE
Glucose, UA: NEGATIVE mg/dL
Ketones, ur: NEGATIVE mg/dL
Leukocytes,Ua: NEGATIVE
Nitrite: NEGATIVE
Protein, ur: NEGATIVE mg/dL
Specific Gravity, Urine: 1.011 (ref 1.005–1.030)
pH: 7 (ref 5.0–8.0)

## 2023-01-21 LAB — COMPREHENSIVE METABOLIC PANEL
ALT: 16 U/L (ref 0–44)
AST: 23 U/L (ref 15–41)
Albumin: 3.9 g/dL (ref 3.5–5.0)
Alkaline Phosphatase: 59 U/L (ref 38–126)
Anion gap: 9 (ref 5–15)
BUN: 21 mg/dL (ref 8–23)
CO2: 26 mmol/L (ref 22–32)
Calcium: 8.6 mg/dL — ABNORMAL LOW (ref 8.9–10.3)
Chloride: 101 mmol/L (ref 98–111)
Creatinine, Ser: 0.77 mg/dL (ref 0.44–1.00)
GFR, Estimated: >60 mL/min (ref >60)
Glucose, Bld: 102 mg/dL — ABNORMAL HIGH (ref 70–99)
Potassium: 4.1 mmol/L (ref 3.5–5.1)
Sodium: 136 mmol/L (ref 135–145)
Total Bilirubin: 0.6 mg/dL (ref 0.3–1.2)
Total Protein: 7.1 g/dL (ref 6.5–8.1)

## 2023-01-21 LAB — CK: Total CK: 81 U/L (ref 38–234)

## 2023-01-21 MED ORDER — SODIUM CHLORIDE 0.9 % IV BOLUS
500.0000 mL | Freq: Once | INTRAVENOUS | Status: AC
  Administered 2023-01-21: 500 mL via INTRAVENOUS

## 2023-01-21 MED ORDER — OXYCODONE-ACETAMINOPHEN 5-325 MG PO TABS
1.0000 | ORAL_TABLET | Freq: Once | ORAL | Status: AC
  Administered 2023-01-21: 1 via ORAL
  Filled 2023-01-21 (×2): qty 1

## 2023-01-21 NOTE — ED Provider Notes (Signed)
[place] EMERGENCY DEPARTMENT AT [facility] Provider Note   CSN: [id number] Arrival date & time: 01/21/23  1456     History  Chief Complaint  Patient presents with   Kaitlin [name] is a 89 y.o. female history of A-fib on Eliquis, here presenting with fall.  Patient states that she had cramps to bilateral legs around 2:30 AM and lost her balance and fell and hit her head on the nightstand.  She states that she had a posterior hematoma afterwards.  She also landed on her buttock and was complaining of some sacral pain.  Patient denies passing out.  The history is provided by the patient.       Home Medications Prior to Admission medications   Medication Sig Start Date End Date Taking? Authorizing Provider  ALPRAZolam (XANAX) 0.25 MG tablet Take 0.125 mg by mouth 3 (three) times daily as needed for anxiety. For sleep    [provider]  apixaban (ELIQUIS) 2.5 MG TABS tablet Take 1 tablet (2.5 mg total) by mouth 2 (two) times daily. 11/29/22   Azalee Course, PA  B Complex-C (SUPER B COMPLEX) TABS Take 1 tablet by mouth daily.      [provider]  calcium carbonate (TUMS - DOSED IN MG ELEMENTAL CALCIUM) 500 MG chewable tablet Chew 1 tablet by mouth 2 (two) times daily.    [provider]  cholecalciferol (VITAMIN D) 1000 units tablet Take 1,000 Units by mouth daily.    [provider]  Multiple Vitamin (MULTIVITAMIN) LIQD Take 15 mLs by mouth daily. 10/01/22   Jerald Kief, MD  nitroGLYCERIN (NITROSTAT) 0.4 MG SL tablet Place 0.4 mg under the tongue every 5 (five) minutes as needed for chest pain.    [provider]  oxyCODONE (OXY IR/ROXICODONE) 5 MG immediate release tablet Take 5 mg by mouth every 6 (six) hours as needed. 12/01/22   [provider]  polyethylene glycol powder (MIRALAX) 17 GM/SCOOP powder Take 17 g by mouth 2 (two) times daily as needed for mild constipation. 09/24/22   Almon Hercules, MD   sennosides-docusate sodium (SENOKOT-S) 8.6-50 MG tablet Take 1-2 tablets by mouth daily as needed for constipation. 09/28/22   Almon Hercules, MD      Allergies    Dilaudid [hydromorphone hcl], Ciprofloxacin, Doxycycline, Gabapentin, Codeine, Morphine, Morphine and related, and Nizatidine    Review of Systems   Review of Systems  Musculoskeletal:        Sacral pain   Skin:  Positive for wound.  All other systems reviewed and are negative.   Physical Exam Updated Vital Signs BP (!) 148/71 (BP Location: Left Arm)   Pulse 69   Temp 98.1 F (36.7 C) (Oral)   Resp 16   Wt 40 kg   SpO2 98%   BMI 15.62 kg/m  Physical Exam Vitals and nursing note reviewed.  HENT:     Head:     Comments: Posterior scalp hematoma    Mouth/Throat:     Mouth: Mucous membranes are moist.  Eyes:     Extraocular Movements: Extraocular movements intact.     Pupils: Pupils are equal, round, and reactive to light.  Cardiovascular:     Rate and Rhythm: Normal rate and regular rhythm.     Pulses: Normal pulses.     Heart sounds: Normal heart sounds.  Pulmonary:     Effort: Pulmonary effort is normal.     Breath sounds: Normal  breath sounds.  Abdominal:     General: Abdomen is flat.     Palpations: Abdomen is soft.  Musculoskeletal:     Cervical back: Normal range of motion and neck supple.     Comments: Mild tenderness bilateral hips.  Normal range of motion of bilateral hips  Skin:    General: Skin is warm.     Capillary Refill: Capillary refill takes less than 2 seconds.  Neurological:     General: No focal deficit present.     Mental Status: She is alert and oriented to person, place, and time.  Psychiatric:        Mood and Affect: Mood normal.        Behavior: Behavior normal.     ED Results / Procedures / Treatments   Labs (all labs ordered are listed, but only abnormal results are displayed) Labs Reviewed  CBC WITH DIFFERENTIAL/PLATELET - Abnormal; Notable for the following  components:      Result Value   Hemoglobin 11.7 (*)    MCH 25.4 (*)    RDW 16.4 (*)    All other components within normal limits  COMPREHENSIVE METABOLIC PANEL - Abnormal; Notable for the following components:   Glucose, Bld 102 (*)    Calcium 8.6 (*)    All other components within normal limits  CK  URINALYSIS, W/ REFLEX TO CULTURE (INFECTION SUSPECTED)    EKG None  Radiology No results found.  Procedures Procedures    Medications Ordered in ED Medications  oxyCODONE-acetaminophen (PERCOCET/ROXICET) 5-325 MG per tablet 1 tablet (has no administration in time range)  sodium chloride 0.9 % bolus 500 mL (has no administration in time range)    ED Course/ Medical Decision Making/ A&P                             Medical Decision Making Kaitlin [name] is a 89 y.o. female here presenting with fall.  Patient has a posterior scalp hematoma.  She is also on Eliquis so we will get CT head and cervical spine to rule out fracture or bleed.  Patient also had injury to her sacral area so we will do a CT pelvis to rule out occult fracture.  Will also get basic labs to make sure she is not in rhabdo.  Patient states that she gets recurrent UTIs so we will check a urinalysis as well.  8:00 PM Patient's CK level was normal.  UA is normal.  CT head and cervical spine unremarkable.  Patient does have a lower sacral fracture.  Patient states that she has Percocet at home.  Since her pain is under control, she stable for discharge.  Will have her follow-up with Ortho outpatient.  Problems Addressed: Closed fracture of sacrum, unspecified portion of sacrum, initial encounter: acute illness or injury Scalp hematoma, initial encounter: acute illness or injury  Amount and/or Complexity of Data Reviewed Labs: ordered. Decision-making details documented in ED Course. Radiology: ordered and independent interpretation performed. Decision-making details documented in ED  Course.  Risk Prescription drug management.    Final Clinical Impression(s) / ED Diagnoses Final diagnoses:  None    Rx / DC Orders ED Discharge Orders     None         Charlynne Pander, MD 01/21/23 2001

## 2023-01-21 NOTE — Telephone Encounter (Signed)
Patient last seen in clinic with me on 12/05/22 for right upper lobe lung nodule. She was seen in ED for fall on 01/21/23. Was scanned with CT head, C-spine, pelvis and chest. Findings significant for nondisplaced fracture of sacrum and enlarging right upper lobe nodule from 6 x 32mm > 8.5 x 7.0 cm.  Staff - please contact patient for follow-up with me next week regarding the changes seen with her right upper lobe nodule on her CT scan. OK to used blocked spots for urgent discussion.

## 2023-01-21 NOTE — ED Triage Notes (Addendum)
Pt reports fall @ 0230 after getting cramps to bilateral legs and losing balance.  Pt hit head on night stand.  Denies loc  Denies headache, neck pain  Also c/o pain to sacrum.  Pt is on eliquis Pt A&O x4

## 2023-01-21 NOTE — Discharge Instructions (Addendum)
You have sacral fracture.  I recommend taking Tylenol for pain and take oxycodone as needed for severe pain  You may need physical therapy for your sacral fracture and I recommend follow-up with orthopedic doctor  As we discussed, you do not have any intracranial bleeding today and you do not have a urine infection  Return to ER if you have another fall, passing out, weakness, unsteadiness

## 2023-01-30 ENCOUNTER — Ambulatory Visit (HOSPITAL_BASED_OUTPATIENT_CLINIC_OR_DEPARTMENT_OTHER): Payer: Medicare Other | Admitting: Pulmonary Disease

## 2023-02-05 ENCOUNTER — Emergency Department (HOSPITAL_COMMUNITY): Payer: Medicare Other

## 2023-02-05 ENCOUNTER — Other Ambulatory Visit: Payer: Self-pay

## 2023-02-05 ENCOUNTER — Encounter (HOSPITAL_COMMUNITY): Payer: Self-pay | Admitting: Emergency Medicine

## 2023-02-05 ENCOUNTER — Observation Stay (HOSPITAL_COMMUNITY)
Admission: EM | Admit: 2023-02-05 | Discharge: 2023-02-08 | Disposition: A | Discharge status: Home / Self Care | Payer: Medicare Other | Attending: Family Medicine | Admitting: Family Medicine

## 2023-02-05 DIAGNOSIS — I4819 Other persistent atrial fibrillation: Secondary | ICD-10-CM | POA: Diagnosis not present

## 2023-02-05 DIAGNOSIS — R2681 Unsteadiness on feet: Secondary | ICD-10-CM | POA: Insufficient documentation

## 2023-02-05 DIAGNOSIS — W19XXXD Unspecified fall, subsequent encounter: Secondary | ICD-10-CM | POA: Insufficient documentation

## 2023-02-05 DIAGNOSIS — S3210XA Unspecified fracture of sacrum, initial encounter for closed fracture: Secondary | ICD-10-CM | POA: Diagnosis not present

## 2023-02-05 DIAGNOSIS — E43 Unspecified severe protein-calorie malnutrition: Secondary | ICD-10-CM | POA: Insufficient documentation

## 2023-02-05 DIAGNOSIS — K219 Gastro-esophageal reflux disease without esophagitis: Secondary | ICD-10-CM | POA: Diagnosis not present

## 2023-02-05 DIAGNOSIS — S322XXD Fracture of coccyx, subsequent encounter for fracture with routine healing: Secondary | ICD-10-CM | POA: Diagnosis not present

## 2023-02-05 DIAGNOSIS — Z79899 Other long term (current) drug therapy: Secondary | ICD-10-CM | POA: Diagnosis not present

## 2023-02-05 DIAGNOSIS — Z8673 Personal history of transient ischemic attack (TIA), and cerebral infarction without residual deficits: Secondary | ICD-10-CM | POA: Insufficient documentation

## 2023-02-05 DIAGNOSIS — M533 Sacrococcygeal disorders, not elsewhere classified: Secondary | ICD-10-CM | POA: Diagnosis present

## 2023-02-05 DIAGNOSIS — I959 Hypotension, unspecified: Secondary | ICD-10-CM | POA: Diagnosis not present

## 2023-02-05 DIAGNOSIS — F341 Dysthymic disorder: Secondary | ICD-10-CM | POA: Diagnosis present

## 2023-02-05 DIAGNOSIS — S3210XS Unspecified fracture of sacrum, sequela: Secondary | ICD-10-CM | POA: Insufficient documentation

## 2023-02-05 DIAGNOSIS — S322XXS Fracture of coccyx, sequela: Secondary | ICD-10-CM

## 2023-02-05 DIAGNOSIS — W19XXXA Unspecified fall, initial encounter: Secondary | ICD-10-CM | POA: Diagnosis not present

## 2023-02-05 DIAGNOSIS — Z7901 Long term (current) use of anticoagulants: Secondary | ICD-10-CM | POA: Insufficient documentation

## 2023-02-05 DIAGNOSIS — S3210XG Unspecified fracture of sacrum, subsequent encounter for fracture with delayed healing: Secondary | ICD-10-CM | POA: Diagnosis not present

## 2023-02-05 DIAGNOSIS — Z9181 History of falling: Secondary | ICD-10-CM | POA: Insufficient documentation

## 2023-02-05 DIAGNOSIS — S3210XD Unspecified fracture of sacrum, subsequent encounter for fracture with routine healing: Secondary | ICD-10-CM | POA: Diagnosis not present

## 2023-02-05 LAB — CBC WITH DIFFERENTIAL/PLATELET
Abs Immature Granulocytes: 0.01 10*3/uL (ref 0.00–0.07)
Basophils Absolute: 0 10*3/uL (ref 0.0–0.1)
Basophils Relative: 0 %
Eosinophils Absolute: 0 10*3/uL (ref 0.0–0.5)
Eosinophils Relative: 1 %
HCT: 37.9 % (ref 36.0–46.0)
Hemoglobin: 11.3 g/dL — ABNORMAL LOW (ref 12.0–15.0)
Immature Granulocytes: 0 %
Lymphocytes Relative: 14 %
Lymphs Abs: 0.8 10*3/uL (ref 0.7–4.0)
MCH: 25.9 pg — ABNORMAL LOW (ref 26.0–34.0)
MCHC: 29.8 g/dL — ABNORMAL LOW (ref 30.0–36.0)
MCV: 86.7 fL (ref 80.0–100.0)
Monocytes Absolute: 0.6 10*3/uL (ref 0.1–1.0)
Monocytes Relative: 10 %
Neutro Abs: 4.3 10*3/uL (ref 1.7–7.7)
Neutrophils Relative %: 75 %
Platelets: 243 10*3/uL (ref 150–400)
RBC: 4.37 MIL/uL (ref 3.87–5.11)
RDW: 17.2 % — ABNORMAL HIGH (ref 11.5–15.5)
WBC: 5.7 10*3/uL (ref 4.0–10.5)
nRBC: 0 % (ref 0.0–0.2)

## 2023-02-05 LAB — URINALYSIS, ROUTINE W REFLEX MICROSCOPIC
Bilirubin Urine: NEGATIVE
Glucose, UA: NEGATIVE mg/dL
Ketones, ur: NEGATIVE mg/dL
Nitrite: NEGATIVE
Protein, ur: NEGATIVE mg/dL
Specific Gravity, Urine: 1.02 (ref 1.005–1.030)
pH: 6 (ref 5.0–8.0)

## 2023-02-05 LAB — COMPREHENSIVE METABOLIC PANEL
ALT: 15 U/L (ref 0–44)
AST: 19 U/L (ref 15–41)
Albumin: 3.1 g/dL — ABNORMAL LOW (ref 3.5–5.0)
Alkaline Phosphatase: 116 U/L (ref 38–126)
Anion gap: 7 (ref 5–15)
BUN: 16 mg/dL (ref 8–23)
CO2: 29 mmol/L (ref 22–32)
Calcium: 8.6 mg/dL — ABNORMAL LOW (ref 8.9–10.3)
Chloride: 100 mmol/L (ref 98–111)
Creatinine, Ser: 0.58 mg/dL (ref 0.44–1.00)
GFR, Estimated: >60 mL/min (ref >60)
Glucose, Bld: 95 mg/dL (ref 70–99)
Potassium: 4.2 mmol/L (ref 3.5–5.1)
Sodium: 136 mmol/L (ref 135–145)
Total Bilirubin: 0.6 mg/dL (ref 0.3–1.2)
Total Protein: 6.7 g/dL (ref 6.5–8.1)

## 2023-02-05 LAB — URINALYSIS, MICROSCOPIC (REFLEX)

## 2023-02-05 MED ORDER — OXYCODONE HCL 5 MG PO TABS
5.0000 mg | ORAL_TABLET | Freq: Four times a day (QID) | ORAL | Status: DC | PRN
  Administered 2023-02-05: 5 mg via ORAL
  Filled 2023-02-05: qty 1

## 2023-02-05 MED ORDER — METHOCARBAMOL 1000 MG/10ML IJ SOLN: 500.0000 mg | Freq: Four times a day (QID) | INTRAVENOUS | Status: DC | PRN

## 2023-02-05 MED ORDER — METHOCARBAMOL 500 MG PO TABS
500.0000 mg | ORAL_TABLET | Freq: Four times a day (QID) | ORAL | Status: DC | PRN
  Administered 2023-02-06 – 2023-02-08 (×6): 500 mg via ORAL
  Filled 2023-02-05 (×6): qty 1

## 2023-02-05 MED ORDER — APIXABAN 2.5 MG PO TABS
2.5000 mg | ORAL_TABLET | Freq: Two times a day (BID) | ORAL | Status: DC
  Administered 2023-02-05 – 2023-02-08 (×6): 2.5 mg via ORAL
  Filled 2023-02-05 (×6): qty 1

## 2023-02-05 MED ORDER — OXYCODONE HCL 5 MG PO TABS: 10.0000 mg | ORAL_TABLET | Freq: Four times a day (QID) | ORAL | Status: DC | PRN

## 2023-02-05 MED ORDER — SENNOSIDES-DOCUSATE SODIUM 8.6-50 MG PO TABS
1.0000 | ORAL_TABLET | Freq: Every day | ORAL | Status: DC | PRN
  Administered 2023-02-07: 2 via ORAL
  Filled 2023-02-05: qty 2
  Filled 2023-02-05: qty 1

## 2023-02-05 MED ORDER — POLYETHYLENE GLYCOL 3350 17 G PO PACK
17.0000 g | PACK | Freq: Two times a day (BID) | ORAL | Status: DC | PRN
  Administered 2023-02-06 – 2023-02-07 (×2): 17 g via ORAL
  Filled 2023-02-05 (×2): qty 1

## 2023-02-05 MED ORDER — FENTANYL CITRATE PF 50 MCG/ML IJ SOSY: 50.0000 ug | PREFILLED_SYRINGE | Freq: Once | INTRAMUSCULAR | Status: DC

## 2023-02-05 MED ORDER — FENTANYL CITRATE PF 50 MCG/ML IJ SOSY: 25.0000 ug | PREFILLED_SYRINGE | INTRAMUSCULAR | Status: DC | PRN

## 2023-02-05 MED ORDER — OXYCODONE HCL 5 MG PO TABS
5.0000 mg | ORAL_TABLET | ORAL | Status: DC | PRN
  Administered 2023-02-05 – 2023-02-08 (×12): 10 mg via ORAL
  Filled 2023-02-05 (×12): qty 2

## 2023-02-05 MED ORDER — ALPRAZOLAM 0.25 MG PO TABS
0.1250 mg | ORAL_TABLET | Freq: Three times a day (TID) | ORAL | Status: DC | PRN
  Administered 2023-02-06 – 2023-02-08 (×6): 0.125 mg via ORAL
  Filled 2023-02-05 (×6): qty 1

## 2023-02-05 MED ORDER — FENTANYL CITRATE PF 50 MCG/ML IJ SOSY
50.0000 ug | PREFILLED_SYRINGE | Freq: Once | INTRAMUSCULAR | Status: AC
  Administered 2023-02-05: 50 ug via INTRAVENOUS
  Filled 2023-02-05: qty 1

## 2023-02-05 MED ORDER — BISACODYL 10 MG RE SUPP: 10.0000 mg | Freq: Every day | RECTAL | Status: DC | PRN

## 2023-02-05 NOTE — ED Notes (Signed)
Patient transported to CT 

## 2023-02-05 NOTE — ED Triage Notes (Signed)
Per [facility] pt coming from home for tailbone pain. Patient had a fall earlier this month and broke her coccyx. Denies any more falls. EMS reports pt able to ambulate a little to stretcher however family states patient has not been able to ambulate at home.

## 2023-02-05 NOTE — Assessment & Plan Note (Signed)
Continue Eliquis 2.5 mg twice daily patient not on rate control
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
## 2023-02-05 NOTE — Assessment & Plan Note (Signed)
Continue Protonix °

## 2023-02-05 NOTE — Assessment & Plan Note (Signed)
Continue home medications Alprazolam  at 0.125 mg 3 times daily as needed for anxiety and sleep

## 2023-02-05 NOTE — ED Provider Notes (Signed)
[place] EMERGENCY DEPARTMENT AT [facility] Provider Note   CSN: [id number] Arrival date & time: 02/05/23  1533     History Afib, IBS, Anxiety  Chief Complaint  Patient presents with   Tailbone Pain    Kaitlin [name] is a 89 y.o. female.  89 y.o female with a PMH of IBS, Anxiety, Afib presents to the ED via EMS with a chief complaint of tailbone pain. Patient suffered a fall about a month ago, evaluated in the ED with a dx of sacrum fracture. Today, family reports she has been regressing while at home, she has not been walking much. She ambulated after she arrived at home and today but has not been doing much in between. Patient reports she was moved by staff, and they "dropped her" accidentally.She and family are concern for a new injury. Patient is on oxycodone q6 but has been taking this every 4 hours instead. She denies any chest pain, shortness of breath or other complaints.   The history is provided by the patient and a relative.       Home Medications Prior to Admission medications   Medication Sig Start Date End Date Taking? Authorizing Provider  ALPRAZolam (XANAX) 0.25 MG tablet Take 0.125 mg by mouth 3 (three) times daily as needed for anxiety. For sleep    [provider]  apixaban (ELIQUIS) 2.5 MG TABS tablet Take 1 tablet (2.5 mg total) by mouth 2 (two) times daily. 11/29/22   Azalee Course, PA  B Complex-C (SUPER B COMPLEX) TABS Take 1 tablet by mouth daily.      [provider]  calcium carbonate (TUMS - DOSED IN MG ELEMENTAL CALCIUM) 500 MG chewable tablet Chew 1 tablet by mouth 2 (two) times daily.    [provider]  cholecalciferol (VITAMIN D) 1000 units tablet Take 1,000 Units by mouth daily.    [provider]  Multiple Vitamin (MULTIVITAMIN) LIQD Take 15 mLs by mouth daily. 10/01/22   Jerald Kief, MD  nitroGLYCERIN (NITROSTAT) 0.4 MG SL tablet Place 0.4 mg under the tongue every 5 (five) minutes as needed for  chest pain.    [provider]  oxyCODONE (OXY IR/ROXICODONE) 5 MG immediate release tablet Take 5 mg by mouth every 6 (six) hours as needed. 12/01/22   [provider]  polyethylene glycol powder (MIRALAX) 17 GM/SCOOP powder Take 17 g by mouth 2 (two) times daily as needed for mild constipation. 09/24/22   Almon Hercules, MD  sennosides-docusate sodium (SENOKOT-S) 8.6-50 MG tablet Take 1-2 tablets by mouth daily as needed for constipation. 09/28/22   Almon Hercules, MD      Allergies    Dilaudid [hydromorphone hcl], Ciprofloxacin, Doxycycline, Gabapentin, Codeine, Morphine, Morphine and related, and Nizatidine    Review of Systems   Review of Systems  Constitutional:  Negative for chills and fever.  Respiratory:  Negative for shortness of breath.   Cardiovascular:  Negative for chest pain.  Gastrointestinal:  Negative for abdominal pain, nausea and vomiting.  Genitourinary:  Negative for flank pain.  Musculoskeletal:  Positive for back pain, gait problem and myalgias.  Neurological:  Negative for light-headedness and headaches.  All other systems reviewed and are negative.   Physical Exam Updated Vital Signs BP (!) 152/92   Pulse 76   Temp (!) 97.4 F (36.3 C) (Oral)   Resp 18   Ht  (1.6 m)   Wt 39.9 kg   SpO2 98%   BMI  15.59 kg/m  Physical Exam Vitals and nursing note reviewed.     ED Results / Procedures / Treatments   Labs (all labs ordered are listed, but only abnormal results are displayed) Labs Reviewed  CBC WITH DIFFERENTIAL/PLATELET - Abnormal; Notable for the following components:      Result Value   Hemoglobin 11.3 (*)    MCH 25.9 (*)    MCHC 29.8 (*)    RDW 17.2 (*)    All other components within normal limits  COMPREHENSIVE METABOLIC PANEL - Abnormal; Notable for the following components:   Calcium 8.6 (*)    Albumin 3.1 (*)    All other components within normal limits  URINALYSIS, ROUTINE W REFLEX MICROSCOPIC - Abnormal; Notable  for the following components:   Hgb urine dipstick MODERATE (*)    Leukocytes,Ua TRACE (*)    All other components within normal limits  URINALYSIS, MICROSCOPIC (REFLEX) - Abnormal; Notable for the following components:   Bacteria, UA RARE (*)    All other components within normal limits    EKG None  Radiology CT PELVIS WO CONTRAST  Result Date: 02/05/2023 CLINICAL DATA:  Hip trauma with fracture suspected. X-ray done. Patient fell earlier this month. Fracture to the coccyx. EXAM: CT PELVIS WITHOUT CONTRAST TECHNIQUE: Multidetector CT imaging of the pelvis was performed following the standard protocol without intravenous contrast. RADIATION DOSE REDUCTION: This exam was performed according to the departmental dose-optimization program which includes automated exposure control, adjustment of the mA and/or kV according to patient size and/or use of iterative reconstruction technique. COMPARISON:  CT pelvis 01/21/2023.  No radiographs are available. FINDINGS: Urinary Tract:  No abnormality visualized. Bowel: Diverticulosis of the colon. Scattered stool throughout the colon. No large or small bowel distention or wall thickening. Vascular/Lymphatic: Aortoiliac calcifications.  No aneurysm. Reproductive:  No mass or other significant abnormality Other: Surgical absence of the gallbladder. Sutures along the midline anterior abdominal wall. No free air or free fluid. Musculoskeletal: Degenerative changes in the lower lumbar spine, hips, and SI joints. Tarlov cysts in the sacrum. Cortical irregularities and depression of the anterior sacral ala bilaterally consistent with acute fractures. There is progressive cortical depression since the prior study. SI joints and symphysis pubis are not displaced. Hips appear intact. IMPRESSION: 1. Acute appearing fractures of the sacral ala bilaterally, progressing since prior study. 2. Degenerative changes in the lower lumbar spine and hips. 3. Aortic atherosclerosis.  Electronically Signed   By: Burman Nieves M.D.   On: 02/05/2023 18:16    Procedures Procedures    Medications Ordered in ED Medications  oxyCODONE (Oxy IR/ROXICODONE) immediate release tablet 5 mg (5 mg Oral Given 02/05/23 1642)  fentaNYL (SUBLIMAZE) injection 50 mcg (has no administration in time range)    ED Course/ Medical Decision Making/ A&P Clinical Course as of 02/05/23 1858  Wed Feb 05, 2023  1802 Leukocytes,Ua(!): TRACE [JS]  1802 Bacteria, UA(!): RARE [JS]    Clinical Course User Index [JS] Claude Manges, PA-C                             Medical Decision Making Amount and/or Complexity of Data Reviewed Labs: ordered. Decision-making details documented in ED Course. Radiology: ordered.  Risk Prescription drug management. Decision regarding hospitalization.    This patient presents to the ED for concern of tailbone pain, this involves a number of treatment options, and is a complaint that carries with it a high risk of  complications and morbidity.  The differential diagnosis includes acute on chronic injury, electrolyte derangement versus radiculopathy.    Co morbidities: Discussed in HPI  Brief History:  See HPI.   EMR reviewed including pt PMHx, past surgical history and past visits to ER.   See HPI for more details   Lab Tests:  I ordered and independently interpreted labs.  The pertinent results include:    I personally reviewed all laboratory work and imaging. Metabolic panel without any acute abnormality specifically kidney function within normal limits and no significant electrolyte abnormalities. CBC without leukocytosis or significant anemia.  Imaging Studies:  CT pelvis showed: 1. Acute appearing fractures of the sacral ala bilaterally,  progressing since prior study.  2. Degenerative changes in the lower lumbar spine and hips.  3. Aortic atherosclerosis.   Medicines ordered:  I ordered medication including oxycodone  for pain  control Reevaluation of the patient after these medicines showed that the patient improved I have reviewed the patients home medicines and have made adjustments as needed  Consults:  I requested consultation with Orthopedic surgery Dr. Ave Filter and discussed lab and imaging findings as well as pertinent plan - they recommend: admission for pain control  Reevaluation:  After the interventions noted above I re-evaluated patient and found that they have :stayed the same  Social Determinants of Health:  The patient's social determinants of health were a factor in the care of this patient   Lives at home with son who is at the bedside.   Problem List / ED Course:  Patient with underlying Afib, presents to the ED with a chief complaint of sacrum pain which has been ongoing since her fall in April 9.  Patient received CT imaging of her head, cervical spine, back last time and diagnosed with a sacral fracture.  On today's visit she reports worsening pain, not controlled with her home pain meds, taking oxycodone Q4 instead of Q6.  She is taking care of by her son who is at the bedside reports patient has not been wanting to get out of bed, has had regression in her ambulation along with independence.  Patient also reports being somewhat constipated at home, however had a large bowel movement on Monday without any blood in her stool.  She has been on hydrocodone in the past and now switched to oxycodone. Labs were obtained today without any acute findings, no electrolyte derangement to account for her decrease in walking.  Urinalysis does show a trace of leukocytes and rare bacteria, daughter at the bedside reports that she needs to be treated as last time she ended up uroseptic.  She was given oxycodone while in the ED.  I did reCT her pelvis with some worsening sacral fractures, spoke to orthopedic team Dr. Ave Filter who recommended admission for pain control.  I spoke to Dr. Adela Glimpse hospitalist service  who will admit patient for ongoing pain control. Patient along with son and family would like to be admitted, will also order PT and OT to evaluated patient.   Dispostion:  After consideration of the diagnostic results and the patients response to treatment, I feel that the patent would benefit from admission for pain control, admitted by Dr. Hayes Ludwig.    Portions of this note were generated with Scientist, clinical (histocompatibility and immunogenetics). Dictation errors may occur despite best attempts at proofreading.   Final Clinical Impression(s) / ED Diagnoses Final diagnoses:  Fall, subsequent encounter  Sacrum and coccyx fracture, sequela    Rx / DC Orders  ED Discharge Orders     None         Claude Manges, Cordelia Poche 02/05/23 Jiles Harold, MD 02/05/23 2328
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
## 2023-02-05 NOTE — Subjective & Objective (Signed)
Came in with tailbone pain unable to ambulate Had a fall around April 9th and had imaging done showing fracture of her sacrum no other falls EMS said that she was able to ambulate to the stretcher but family feels like she is too much pain to ambulate requesting admission

## 2023-02-05 NOTE — Progress Notes (Signed)
PT Cancellation Note  Patient Details Name: Kaitlin [name] MRN: [id number] DOB: July 29, 1927   Cancelled Treatment:    Reason Eval/Treat Not Completed: Other (comment) Awaiting further imaging.  [name], PT, DPT Acute Rehabilitation Services Office [phone]    Norval Morton 02/05/2023, 4:40 PM

## 2023-02-05 NOTE — Assessment & Plan Note (Signed)
ER provider discussed case with Dr. Alethia Berthold orthopedics they recommend admission for pain control and OT PT therapy Provide Fentanyl IV 25 mg as needed as needed for breakthrough and increased dose of oxycodone as patient states it does not help her pain at this state

## 2023-02-06 DIAGNOSIS — S3210XS Unspecified fracture of sacrum, sequela: Secondary | ICD-10-CM | POA: Diagnosis not present

## 2023-02-06 DIAGNOSIS — S3210XG Unspecified fracture of sacrum, subsequent encounter for fracture with delayed healing: Secondary | ICD-10-CM | POA: Diagnosis not present

## 2023-02-06 LAB — RETICULOCYTES
Immature Retic Fract: 9.1 % (ref 2.3–15.9)
RBC.: 4.33 MIL/uL (ref 3.87–5.11)
Retic Count, Absolute: 49.8 10*3/uL (ref 19.0–186.0)
Retic Ct Pct: 1.2 % (ref 0.4–3.1)

## 2023-02-06 LAB — FOLATE: Folate: 24 ng/mL (ref >5.9)

## 2023-02-06 LAB — VITAMIN B12: Vitamin B-12: 1031 pg/mL — ABNORMAL HIGH (ref 180–914)

## 2023-02-06 LAB — PHOSPHORUS: Phosphorus: 3.6 mg/dL (ref 2.5–4.6)

## 2023-02-06 LAB — MAGNESIUM: Magnesium: 2 mg/dL (ref 1.7–2.4)

## 2023-02-06 LAB — IRON AND TIBC
Iron: 29 ug/dL (ref 28–170)
Saturation Ratios: 9 % — ABNORMAL LOW (ref 10.4–31.8)
TIBC: 330 ug/dL (ref 250–450)
UIBC: 301 ug/dL

## 2023-02-06 LAB — FERRITIN: Ferritin: 33 ng/mL (ref 11–307)

## 2023-02-06 LAB — PREALBUMIN: Prealbumin: 9 mg/dL — ABNORMAL LOW (ref 18–38)

## 2023-02-06 LAB — CK: Total CK: 26 U/L — ABNORMAL LOW (ref 38–234)

## 2023-02-06 LAB — VITAMIN D 25 HYDROXY (VIT D DEFICIENCY, FRACTURES): Vit D, 25-Hydroxy: 34.21 ng/mL (ref 30–100)

## 2023-02-06 MED ORDER — ACETAMINOPHEN 500 MG PO TABS
1000.0000 mg | ORAL_TABLET | Freq: Three times a day (TID) | ORAL | Status: DC
  Administered 2023-02-06 – 2023-02-07 (×4): 1000 mg via ORAL
  Filled 2023-02-06 (×4): qty 2

## 2023-02-06 MED ORDER — LIDOCAINE 5 % EX PTCH
1.0000 | MEDICATED_PATCH | CUTANEOUS | Status: DC
  Administered 2023-02-06 – 2023-02-08 (×3): 1 via TRANSDERMAL
  Filled 2023-02-06 (×3): qty 1

## 2023-02-06 NOTE — Evaluation (Signed)
Occupational Therapy Evaluation Patient Details Name: Kaitlin [name] MRN: [id number] DOB: 09-06-1927 Today's Date: 02/06/2023   History of Present Illness Pt is a 89 y/o female who presents to the ED with pain and inability to ambulate after a fall around 01/21/2023. Sacral fracture found on imaging that appears to be progressing with prior study. PMH significant for anxiety, a-fib, GIB.   Clinical Impression   At baseline PLOF, pt lives alone and is Independent with ADLs, IADLs, and functional mobility. Pt was driving. Pt now plans to discharge home with son and daughter-in-law. Pt currently completes UB ADLs Independent while sitting and requires Mod to Max assist with LB ADLs. Pt currently requires Min guard to Min assist for functional transfers and functional mobility. Pt current functional level is limited by pain. Pt participated well in session and is motivated to return to PLOF. Pt will benefit from acute skilled OT services to increase safety and independence with ADLs, functional transfers, and functional mobility. Post acute discharge, pt will benefit from continued skilled OT services in the home.      Recommendations for follow up therapy are one component of a multi-disciplinary discharge planning process, led by the attending physician.  Recommendations may be updated based on patient status, additional functional criteria and insurance authorization.   Assistance Recommended at Discharge Frequent or constant Supervision/Assistance  Patient can return home with the following A little help with walking and/or transfers;A lot of help with bathing/dressing/bathroom;Assistance with cooking/housework;Assist for transportation;Help with stairs or ramp for entrance    Functional Status Assessment  Patient has had a recent decline in their functional status and demonstrates the ability to make significant improvements in function in a reasonable and predictable amount of time.   Equipment Recommendations  BSC/3in1    Recommendations for Other Services       Precautions / Restrictions Precautions Precautions: Fall;Back Precaution Booklet Issued: No Precaution Comments: Back precautions for comfort Restrictions Weight Bearing Restrictions: No      Mobility Bed Mobility Overal bed mobility: Needs Assistance Bed Mobility: Supine to Sit     Supine to sit: Min assist     General bed mobility comments: Light assist to advance LE's around to EOB. VC's to reach for railing and bed pad utilized to pivot hips around. Pt able to scoot out to EOB to get feet on floor without assist.    Transfers Overall transfer level: Needs assistance Equipment used: Rolling walker (2 wheels) Transfers: Sit to/from Stand, Bed to chair/wheelchair/BSC Sit to Stand: Min assist     Step pivot transfers: Min assist     General transfer comment: Assist for power up to full stand. Increased time to achieve max upright posture. Pt/family reports pt is grossly flexed at baseline.      Balance Overall balance assessment: Needs assistance Sitting-balance support: Feet supported, No upper extremity supported Sitting balance-Leahy Scale: Fair     Standing balance support: No upper extremity supported, During functional activity, Reliant on assistive device for balance Standing balance-Leahy Scale: Fair Standing balance comment: Able to maintain static standing at sink  for short bouts without assist or UE support.                           ADL either performed or assessed with clinical judgement   ADL Overall ADL's : Needs assistance/impaired Eating/Feeding: Independent   Grooming: Wash/dry hands;Wash/dry face;Oral care;Brushing hair;Min guard;Standing   Upper Body Bathing: Min guard;Sitting   Lower  Body Bathing: Maximal assistance;Sit to/from stand;Cueing for compensatory techniques;Cueing for back precautions;Cueing for safety   Upper Body Dressing :  Independent;Sitting   Lower Body Dressing: Maximal assistance;Cueing for compensatory techniques;Cueing for back precautions;Sit to/from stand;Cueing for safety   Toilet Transfer: Minimal assistance;Rolling walker (2 wheels);BSC/3in1;Cueing for safety   Toileting- Clothing Manipulation and Hygiene: Moderate assistance;Cueing for compensatory techniques;Cueing for back precautions;Sit to/from stand;Cueing for safety       Functional mobility during ADLs: Minimal assistance;Rolling walker (2 wheels);Cueing for safety       Vision Baseline Vision/History: 1 Wears glasses Ability to See in Adequate Light: 0 Adequate Patient Visual Report: No change from baseline       Perception     Praxis Praxis Praxis tested?: Within functional limits    Pertinent Vitals/Pain Pain Assessment Pain Assessment: Faces Faces Pain Scale: Hurts little more Pain Location: Sacrum occasionally with movement. Reports "shifting" feeling of bones during mobility. Pain Descriptors / Indicators: Sore, Aching Pain Intervention(s): Limited activity within patient's tolerance, Monitored during session, Repositioned, Other (comment), Premedicated before session (Pt recieved pain medication approx. 1.5 hours before session.)     Hand Dominance Right   Extremity/Trunk Assessment Upper Extremity Assessment Upper Extremity Assessment: RUE deficits/detail (B UE strength WFL; B UE AROM WFL) RUE Deficits / Details: resting tremor on R side RUE Sensation: WNL RUE Coordination: WNL   Lower Extremity Assessment Lower Extremity Assessment: Defer to PT evaluation RLE Deficits / Details: Resting tremor on R side. Grossly 4/5 strength in quads/hamstrings/hip flexors. Age appropriate strength.   Cervical / Trunk Assessment Cervical / Trunk Assessment: Kyphotic   Communication Communication Communication: No difficulties   Cognition Arousal/Alertness: Awake/alert Behavior During Therapy: WFL for tasks  assessed/performed Overall Cognitive Status: Within Functional Limits for tasks assessed                                       General Comments  Pt particpated well in skilled OT eval and is motivated to participate. Pt has good family support.    Exercises     Shoulder Instructions      Home Living Family/patient expects to be discharged to:: Private residence Living Arrangements: Alone;Children (Pt typical lives alone, but will be discharging to son's home.) Available Help at Discharge: Family;Available 24 hours/day Type of Home: House Home Access: Stairs to enter [employer] of Steps: 5 Entrance Stairs-Rails: Right;Left Home Layout: One level     Bathroom Shower/Tub: Tub/shower unit;Walk-in shower   Bathroom Toilet: Handicapped height     Home Equipment: Hand held shower head;Educational psychologist (4 wheels)   Additional Comments: children check in on her daily      Prior Functioning/Environment Prior Level of Function : Independent/Modified Independent;Driving;History of Falls (last six months)             Mobility Comments: Previously Independent ADLs Comments: Independent at baseline PLOF        OT Problem List: Decreased activity tolerance;Impaired balance (sitting and/or standing);Decreased knowledge of use of DME or AE;Pain      OT Treatment/Interventions: Self-care/ADL training;Therapeutic exercise;Energy conservation;Therapeutic activities;Patient/family education;Balance training;DME and/or AE instruction    OT Goals(Current goals can be found in the care plan section) Acute Rehab OT Goals Patient Stated Goal: Pt wants to return to being Independent with ADLs and return to living Independently. OT Goal Formulation: With patient Time For Goal Achievement: 02/20/23 Potential to Achieve Goals: Good ADL Goals  Pt Will Perform Grooming: with modified independence;standing Pt Will Perform Lower Body Bathing: with  supervision;sit to/from stand (with adaptive equipment as needed) Pt Will Perform Lower Body Dressing: with modified independence;sit to/from stand (with adaptive equipment as needed) Pt Will Transfer to Toilet: with modified independence;bedside commode;ambulating Pt Will Perform Toileting - Clothing Manipulation and hygiene: with modified independence;sit to/from stand  OT Frequency: Min 2X/week    Co-evaluation PT/OT/SLP Co-Evaluation/Treatment: Yes Reason for Co-Treatment: To address functional/ADL transfers;For patient/therapist safety PT goals addressed during session: Mobility/safety with mobility;Balance;Proper use of DME;Strengthening/ROM OT goals addressed during session: ADL's and self-care;Proper use of Adaptive equipment and DME      AM-PAC OT "6 Clicks" Daily Activity     Outcome Measure Help from another person eating meals?: None Help from another person taking care of personal grooming?: A Little (in standing) Help from another person toileting, which includes using toliet, bedpan, or urinal?: A Lot Help from another person bathing (including washing, rinsing, drying)?: A Lot Help from another person to put on and taking off regular upper body clothing?: None Help from another person to put on and taking off regular lower body clothing?: A Lot 6 Click Score: 17   End of Session Equipment Utilized During Treatment: Gait belt;Rolling walker (2 wheels) Nurse Communication: Mobility status  Activity Tolerance: Patient limited by pain Patient left: in chair;with call bell/phone within reach;with family/visitor present  OT Visit Diagnosis: Unsteadiness on feet (R26.81);Repeated falls (R29.6);History of falling (Z91.81);Pain                Time: 4098-1191 OT Time Calculation (min): 32 min Charges:  OT General Charges $OT Visit: 1 Visit OT Evaluation $OT Eval Low Complexity: 1 Low OT Treatments $Self Care/Home Management : 8-22 mins  Leeam Cedrone "Orson Eva., OTR/L, MA Acute  Rehab [phone]   Lendon Colonel 02/06/2023, 2:26 PM

## 2023-02-06 NOTE — Hospital Course (Addendum)
89 y.o.f with past medical history of IBS, Anxiety, Afib  on eliquis, hx of Gi bleed, anemia presented to the ED with pain and tailbone, unable to ambulate after a fall around April 9 and had imaging done showing fracture of her sacrum, no other falls and per EMS she was able to ambulate to the stretcher but family feels like she is having too much pain to ambulate and admission requested. At home has been taking oxycodone. Family is concerned that she was dropped by EMS onto the stretcher In the ED CT pelvis acute appearing fracture of the sacral ala bilaterally progressing since prior study, degenerative changes in the lower lumbar spine and hips, aortic atherosclerosis. Labs fairly stable

## 2023-02-06 NOTE — Progress Notes (Signed)
PROGRESS NOTE Kaitlin [name]  ZOX:[id number] DOB: Nov 09, 1926 DOA: 02/05/2023 PCP: Thana Ates, MD  Brief Narrative/Hospital Course:  89 y.o.f with past medical history of IBS, Anxiety, Afib  on eliquis, hx of Gi bleed, anemia presented to the ED with pain and tailbone, unable to ambulate after a fall around April 9 and had imaging done showing fracture of her sacrum, no other falls and per EMS she was able to ambulate to the stretcher but family feels like she is having too much pain to ambulate and admission requested. At home has been taking oxycodone. Family is concerned that she was dropped by EMS onto the stretcher In the ED CT pelvis acute appearing fracture of the sacral ala bilaterally progressing since prior study, degenerative changes in the lower lumbar spine and hips, aortic atherosclerosis. Labs fairly stable     Subjective: Patient seen and examined this morning.  Complains of back pain which is limiting her mobility Son arrived at the bedside Patient is alert awake appears anxious Low past 24 hours afebrile BP -120s, saturating well on room air Labs showed CK20 6, anemia panel fairly stable with saturation at 9, vitamin D 34 and B12 103 1 stable CBC, UA on admit and unremarkable   Assessment and Plan: Principal Problem:   Sacral fracture, closed Active Problems:   Persistent atrial fibrillation   ANXIETY DEPRESSION   GERD  Acute appearing sacral alla fracture bilaterally progressing since 01/21/23 Tailbone pain Recent Fall CT pelvis>fracture of the sacral ala bilaterally progressing since prior study.  ED discussed with Dr. Ave Filter from orthopedics advised admission for pain control, PT OT.Continue current pain management: Add Tylenol 1000 mg 3 times daily, lidocaine patch, continue oxy p.o. and iv opiates-muscle relaxant bowel regimen.  Will try to limit opiates.  Pain management discussed with patient's son and patient and they are agreeable  Persistent atrial  fibrillation on Eliquis 2.5 mg continue the same.  Not on rate controlling agent GERD continue PPI Anxiety/depression on alprazolam as needed.  Appears to have a lot of anxieties. IBS: Symptomatic management.  LOW BMI Body mass index is 15.59 kg/m. :RD consulted augment diet Nutrition Status:       DVT prophylaxis: apixaban (ELIQUIS) tablet 2.5 mg Start: 02/05/23 2230 Code Status:   Code Status: Full Code Family Communication: plan of care discussed with patient/son at bedside. Patient status is:  admitted as observation but remains hospitalized for ongoing  because of due to uncontrolled pain Level of care: Med-Surg   Dispo: The patient is from: home             Anticipated disposition: TBD, PTOT pending Objective: Vitals last 24 hrs: Vitals:   02/05/23 2142 02/05/23 2215 02/06/23 0347 02/06/23 0751  BP: (!) 155/92 (!) 144/79 (!) 117/58 (!) 124/56  Pulse: 86 82 73 75  Resp: 18 18 19    Temp:  98.5 F (36.9 C) 97.9 F (36.6 C) 98.6 F (37 C)  TempSrc:  Oral Oral Oral  SpO2: 97% 100% 98% 99%  Weight:      Height:       Weight change:   Physical Examination: General exam: alert awake, frail, frail older than stated age HEENT:Oral mucosa moist, Ear/Nose WNL grossly Respiratory system: bilaterally clear BS, no use of accessory muscle Cardiovascular system: S1 & S2 +, No JVD. Gastrointestinal system: Abdomen soft,NT,ND, BS+ Nervous System:Alert, awake, moving extremities. Extremities: LE edema neg,distal peripheral pulses palpable.  Skin: No rashes,no icterus. MSK: Normal muscle bulk,tone, power  Medications reviewed:  Scheduled Meds:  acetaminophen  1,000 mg Oral TID   apixaban  2.5 mg Oral BID   lidocaine  1 patch Transdermal Q24H   Continuous Infusions:  methocarbamol (ROBAXIN) IV     Unresulted Labs (From admission, onward)     Start     Ordered   02/07/23 0500  Basic metabolic panel  Tomorrow morning,   R        02/06/23 0901   02/07/23 0500  CBC  Tomorrow  morning,   R        02/06/23 0901           Data Reviewed: I have personally reviewed following labs and imaging studies CBC: Recent Labs  Lab 02/05/23 1628  WBC 5.7  NEUTROABS 4.3  HGB 11.3*  HCT 37.9  MCV 86.7  PLT 243   Basic Metabolic Panel: Recent Labs  Lab 02/05/23 1628 02/06/23 0329  NA 136  --   K 4.2  --   CL 100  --   CO2 29  --   GLUCOSE 95  --   BUN 16  --   CREATININE 0.58  --   CALCIUM 8.6*  --   MG  --  2.0  PHOS  --  3.6   GFR: Estimated Creatinine Clearance: 27.7 mL/min (by C-G formula based on SCr of 0.58 mg/dL). Liver Function Tests: Recent Labs  Lab 02/05/23 1628  AST 19  ALT 15  ALKPHOS 116  BILITOT 0.6  PROT 6.7  ALBUMIN 3.1*   No results for input(s): "LIPASE", "AMYLASE" in the last 168 hours. No results for input(s): "AMMONIA" in the last 168 hours. Coagulation Profile: No results for input(s): "INR", "PROTIME" in the last 168 hours. BNP (last 3 results) No results for input(s): "PROBNP" in the last 8760 hours. HbA1C: No results for input(s): "HGBA1C" in the last 72 hours. CBG: No results for input(s): "GLUCAP" in the last 168 hours. Lipid Profile: No results for input(s): "CHOL", "HDL", "LDLCALC", "TRIG", "CHOLHDL", "LDLDIRECT" in the last 72 hours. Thyroid Function Tests: No results for input(s): "TSH", "T4TOTAL", "FREET4", "T3FREE", "THYROIDAB" in the last 72 hours. Sepsis Labs: No results for input(s): "PROCALCITON", "LATICACIDVEN" in the last 168 hours.  No results found for this or any previous visit (from the past 240 hour(s)).  Antimicrobials: Anti-infectives (From admission, onward)    None      Culture/Microbiology    Component Value Date/Time   SDES  09/25/2022 1242    BLOOD RIGHT ARM Performed at [facility], [address] [place], [place] [address]    SDES  09/25/2022 1242    SITE NOT SPECIFIED BLOOD Performed at [facility], [address] [place], [place] [address]     SPECREQUEST  09/25/2022 1242    BOTTLES DRAWN AEROBIC ONLY Blood Culture adequate volume Performed at [facility], [address], [place], [place] [address]    SPECREQUEST  09/25/2022 1242    BOTTLES DRAWN AEROBIC ONLY Blood Culture adequate volume Performed at [facility], [address], [place], [place] [address]    CULT  09/25/2022 1242    NO GROWTH 5 DAYS Performed at [facility], [address] [place], [place] [address]    CULT  09/25/2022 1242    NO GROWTH 5 DAYS Performed at [facility], [address], [place], [place] [address]    REPTSTATUS 09/30/2022 FINAL 09/25/2022 1242   REPTSTATUS 09/30/2022  FINAL 09/25/2022 1242   Radiology Studies: CT PELVIS WO CONTRAST  Result Date: 02/05/2023 CLINICAL DATA:  Hip trauma with fracture suspected. X-ray done. Patient fell earlier this month. Fracture to the coccyx. EXAM: CT PELVIS WITHOUT CONTRAST TECHNIQUE: Multidetector CT imaging of the pelvis was performed following the standard protocol without intravenous contrast. RADIATION DOSE REDUCTION: This exam was performed according to the departmental dose-optimization program which includes automated exposure control, adjustment of the mA and/or kV according to patient size and/or use of iterative reconstruction technique. COMPARISON:  CT pelvis 01/21/2023.  No radiographs are available. FINDINGS: Urinary Tract:  No abnormality visualized. Bowel: Diverticulosis of the colon. Scattered stool throughout the colon. No large or small bowel distention or wall thickening. Vascular/Lymphatic: Aortoiliac calcifications.  No aneurysm. Reproductive:  No mass or other significant abnormality Other: Surgical absence of the gallbladder. Sutures along the midline anterior abdominal wall. No free air or free fluid. Musculoskeletal: Degenerative changes in the lower lumbar spine, hips, and SI joints. Tarlov cysts in the sacrum. Cortical irregularities  and depression of the anterior sacral ala bilaterally consistent with acute fractures. There is progressive cortical depression since the prior study. SI joints and symphysis pubis are not displaced. Hips appear intact. IMPRESSION: 1. Acute appearing fractures of the sacral ala bilaterally, progressing since prior study. 2. Degenerative changes in the lower lumbar spine and hips. 3. Aortic atherosclerosis. Electronically Signed   By: Burman Nieves M.D.   On: 02/05/2023 18:16     LOS: 0 days   Lanae Boast, MD [facility]  02/06/2023, 11:27 AM

## 2023-02-06 NOTE — Progress Notes (Signed)
Initial Nutrition Assessment  DOCUMENTATION CODES:   Severe malnutrition in context of chronic illness, Underweight  INTERVENTION:  Continue regular diet as ordered; family to assist with meal ordering Ensure Enlive po BID, each supplement provides 350 kcal and 20 grams of protein. Magic cup TID with meals, each supplement provides 290 kcal and 9 grams of protein "High Calorie, High Protein Nutrition Therapy" handout added to AVS  NUTRITION DIAGNOSIS:   Severe Malnutrition related to social / environmental circumstances (advanced age, lives alone) as evidenced by severe fat depletion, severe muscle depletion.  GOAL:   Patient will meet greater than or equal to 90% of their needs  MONITOR:   PO intake, Supplement acceptance, Labs, Weight trends  REASON FOR ASSESSMENT:   Consult Hip fracture protocol  ASSESSMENT:   Pt admitted with sacral pain 2/2 prior sacral fracture 4/9. PMH significant for IBS, anxiety, afib, GIB, anemia.  Pt receiving medical management for pain 2/2 sacral fracture.   Pt noted to be drowsy at time of visit. Her son present at bedside reports she was just given some pain medications.   Pt lives at home alone however her son states that she has been living with him intermittently since December. When she is with him she "eats good." Otherwise pt states that she does not cook and eat the way she used to with everyone grown up and moved out. Her other son is a Teacher, early years/pre. She recently started taking ElderTonic which when looked up is a MVI/B-complex containing supplement. Her son states that this has helped with her appetite. She usually eats very small portions. Her son encourages her to consume high calorie foods such as donuts and ice cream, to help encourage weight gain as pt is motivated and trying to increase her weight.   Son inquiring about bringing ElderTonic to the hospital for pt to continue during admission. Reached out to Pharmacy and MD to  coordinate as this unfortunately is not carried inpatient. Son plans to monitor her appetite and PO intake and will bring is he notices her intake declining.   Reviewed weight history. It appears pt's weight has remained stable within the last year. Current weight is 39.9 kg compared to with of 40 kg since December 2023.   Medications and labs reviewed  NUTRITION - FOCUSED PHYSICAL EXAM:  Flowsheet Row Most Recent Value  Orbital Region Severe depletion  Upper Arm Region Severe depletion  Thoracic and Lumbar Region Severe depletion  Buccal Region Severe depletion  Temple Region Severe depletion  Clavicle Bone Region Severe depletion  Clavicle and Acromion Bone Region Severe depletion  Scapular Bone Region Severe depletion  Dorsal Hand Severe depletion  Patellar Region Severe depletion  Anterior Thigh Region Severe depletion  Posterior Calf Region Severe depletion  Edema (RD Assessment) None  Hair Reviewed  Eyes Reviewed  Mouth Reviewed  Skin Reviewed  Nails Reviewed       Diet Order:   Diet Order             Diet regular Fluid consistency: Thin  Diet effective now                   EDUCATION NEEDS:   Education needs have been addressed  Skin:  Skin Assessment: Reviewed RN Assessment  Last BM:  unknown  Height:   Ht Readings from Last 1 Encounters:  02/05/23  (1.6 m)    Weight:   Wt Readings from Last 1 Encounters:  02/05/23 39.9 kg  BMI:  Body mass index is 15.59 kg/m.  Estimated Nutritional Needs:   Kcal:  1200-1400  Protein:  60-75g  Fluid:  1.2-1.4L  Kaitlin [name], RDN, LDN Clinical Nutrition

## 2023-02-06 NOTE — Evaluation (Signed)
Physical Therapy Evaluation  Patient Details Name: Kaitlin [name] MRN: [id number] DOB: 15-Jan-1927 Today's Date: 02/06/2023  History of Present Illness  Pt is a 89 y/o female who presents to the ED with pain and inability to ambulate after a fall around 01/21/2023. Sacral fracture found on imaging that appears to be progressing with prior study. PMH significant for anxiety, a-fib, GIB.   Clinical Impression  Pt admitted with above diagnosis. Pt currently with functional limitations due to the deficits listed below (see PT Problem List). At the time of PT eval pt was able to perform transfers and ambulation with gross min assist and RW for support. Pt will benefit from acute skilled PT to increase their independence and safety with mobility to allow discharge.          Recommendations for follow up therapy are one component of a multi-disciplinary discharge planning process, led by the attending physician.  Recommendations may be updated based on patient status, additional functional criteria and insurance authorization.  Follow Up Recommendations       Assistance Recommended at Discharge Frequent or constant Supervision/Assistance  Patient can return home with the following  A little help with walking and/or transfers;A little help with bathing/dressing/bathroom;Assistance with cooking/housework;Assist for transportation;Help with stairs or ramp for entrance    Equipment Recommendations Rolling walker (2 wheels)  Recommendations for Other Services       Functional Status Assessment Patient has had a recent decline in their functional status and demonstrates the ability to make significant improvements in function in a reasonable and predictable amount of time.     Precautions / Restrictions Precautions Precautions: Fall;Back Precaution Booklet Issued: No Precaution Comments: Back precautions for comfort Restrictions Weight Bearing Restrictions: No      Mobility  Bed  Mobility Overal bed mobility: Needs Assistance Bed Mobility: Supine to Sit     Supine to sit: Min assist     General bed mobility comments: Light assist to advance LE's around to EOB. VC's to reach for railing and bed pad utilized to pivot hips around. Pt able to scoot out to EOB to get feet on floor without assist.    Transfers Overall transfer level: Needs assistance Equipment used: Rolling walker (2 wheels) Transfers: Sit to/from Stand Sit to Stand: Min assist           General transfer comment: Assist for power up to full stand. Increased time to achieve max upright posture. Pt/family reports pt is grossly flexed at baseline.    Ambulation/Gait Ambulation/Gait assistance: Min assist Gait Distance (Feet): 7 Feet Assistive device: Rolling walker (2 wheels) Gait Pattern/deviations: Step-through pattern, Decreased stride length, Trunk flexed, Narrow base of support Gait velocity: Decreased Gait velocity interpretation: <1.31 ft/sec, indicative of household ambulator   General Gait Details: Slow and guarded due to anticipation of pain. Pt tolerated well without complaints of increased pain. Occasional assist for walker management.  Stairs            Wheelchair Mobility    Modified Rankin (Stroke Patients Only)       Balance Overall balance assessment: Needs assistance Sitting-balance support: Feet supported, No upper extremity supported Sitting balance-Leahy Scale: Fair     Standing balance support: No upper extremity supported, During functional activity, Reliant on assistive device for balance Standing balance-Leahy Scale: Fair Standing balance comment: Able to maintain static standing at sink  for short bouts without assist or UE support.  Pertinent Vitals/Pain Pain Assessment Pain Assessment: Faces Faces Pain Scale: Hurts little more Pain Location: Sacrum occasionally with movement. Reports "shifting" feeling of  bones during mobility. Pain Descriptors / Indicators: Sore, Aching Pain Intervention(s): Limited activity within patient's tolerance, Monitored during session, Repositioned    Home Living Family/patient expects to be discharged to:: Private residence Living Arrangements: Alone;Children Available Help at Discharge: Family;Available 24 hours/day Type of Home: House Home Access: Stairs to enter Entrance Stairs-Rails: Doctor, general practice of Steps: 5   Home Layout: One level Home Equipment: Hand held shower head;Educational psychologist (4 wheels) Additional Comments: children check in on her daily    Prior Function Prior Level of Function : Independent/Modified Independent;Driving;History of Falls (last six months)                     Hand Dominance   Dominant Hand: Right    Extremity/Trunk Assessment   Upper Extremity Assessment Upper Extremity Assessment: RUE deficits/detail RUE Deficits / Details: resting tremor on R side    Lower Extremity Assessment Lower Extremity Assessment: RLE deficits/detail RLE Deficits / Details: Resting tremor on R side. Grossly 4/5 strength in quads/hamstrings/hip flexors. Age appropriate strength.    Cervical / Trunk Assessment Cervical / Trunk Assessment: Kyphotic  Communication   Communication: No difficulties  Cognition Arousal/Alertness: Awake/alert Behavior During Therapy: WFL for tasks assessed/performed Overall Cognitive Status: Within Functional Limits for tasks assessed                                          General Comments      Exercises     Assessment/Plan    PT Assessment Patient needs continued PT services  PT Problem List Decreased strength;Decreased activity tolerance;Decreased balance;Decreased mobility;Decreased knowledge of use of DME;Decreased safety awareness;Decreased knowledge of precautions;Pain       PT Treatment Interventions DME instruction;Gait training;Stair  training;Functional mobility training;Therapeutic activities;Therapeutic exercise;Balance training;Patient/family education    PT Goals (Current goals can be found in the Care Plan section)  Acute Rehab PT Goals Patient Stated Goal: Return to baseline of function, living alone PT Goal Formulation: With patient/family Time For Goal Achievement: 02/13/23 Potential to Achieve Goals: Good    Frequency Min 3X/week     Co-evaluation PT/OT/SLP Co-Evaluation/Treatment: Yes Reason for Co-Treatment: To address functional/ADL transfers;For patient/therapist safety PT goals addressed during session: Mobility/safety with mobility;Balance;Proper use of DME;Strengthening/ROM         AM-PAC PT "6 Clicks" Mobility  Outcome Measure Help needed turning from your back to your side while in a flat bed without using bedrails?: A Little Help needed moving from lying on your back to sitting on the side of a flat bed without using bedrails?: A Little Help needed moving to and from a bed to a chair (including a wheelchair)?: A Little Help needed standing up from a chair using your arms (e.g., wheelchair or bedside chair)?: A Little Help needed to walk in hospital room?: A Little Help needed climbing 3-5 steps with a railing? : A Little 6 Click Score: 18    End of Session Equipment Utilized During Treatment: Gait belt Activity Tolerance: Patient tolerated treatment well Patient left: in chair;with call bell/phone within reach;with family/visitor present Nurse Communication: Mobility status PT Visit Diagnosis: Unsteadiness on feet (R26.81);Pain Pain - part of body:  (sacrum)    Time: 4098-1191 PT Time Calculation (min) (ACUTE ONLY): 36 min  Charges:   PT Evaluation $PT Eval Moderate Complexity: 1 Mod          Kaitlin [name], PT, DPT Acute Rehabilitation Services Secure Chat Preferred Office: [phone]   Kaitlin [name] 02/06/2023, 1:44 PM

## 2023-02-07 DIAGNOSIS — E43 Unspecified severe protein-calorie malnutrition: Secondary | ICD-10-CM | POA: Insufficient documentation

## 2023-02-07 DIAGNOSIS — S3210XG Unspecified fracture of sacrum, subsequent encounter for fracture with delayed healing: Secondary | ICD-10-CM | POA: Diagnosis not present

## 2023-02-07 DIAGNOSIS — S3210XS Unspecified fracture of sacrum, sequela: Secondary | ICD-10-CM | POA: Diagnosis not present

## 2023-02-07 LAB — CBC
HCT: 45.2 % (ref 36.0–46.0)
Hemoglobin: 13.5 g/dL (ref 12.0–15.0)
MCH: 25.8 pg — ABNORMAL LOW (ref 26.0–34.0)
MCHC: 29.9 g/dL — ABNORMAL LOW (ref 30.0–36.0)
MCV: 86.3 fL (ref 80.0–100.0)
Platelets: 297 10*3/uL (ref 150–400)
RBC: 5.24 MIL/uL — ABNORMAL HIGH (ref 3.87–5.11)
RDW: 17.1 % — ABNORMAL HIGH (ref 11.5–15.5)
WBC: 5.1 10*3/uL (ref 4.0–10.5)
nRBC: 0 % (ref 0.0–0.2)

## 2023-02-07 LAB — BASIC METABOLIC PANEL
Anion gap: 11 (ref 5–15)
BUN: 14 mg/dL (ref 8–23)
CO2: 29 mmol/L (ref 22–32)
Calcium: 9.4 mg/dL (ref 8.9–10.3)
Chloride: 97 mmol/L — ABNORMAL LOW (ref 98–111)
Creatinine, Ser: 0.73 mg/dL (ref 0.44–1.00)
GFR, Estimated: >60 mL/min (ref >60)
Glucose, Bld: 100 mg/dL — ABNORMAL HIGH (ref 70–99)
Potassium: 4.2 mmol/L (ref 3.5–5.1)
Sodium: 137 mmol/L (ref 135–145)

## 2023-02-07 MED ORDER — ACETAMINOPHEN 500 MG PO TABS
1000.0000 mg | ORAL_TABLET | Freq: Three times a day (TID) | ORAL | Status: DC
  Administered 2023-02-07 – 2023-02-08 (×4): 1000 mg via ORAL
  Filled 2023-02-07 (×4): qty 2

## 2023-02-07 NOTE — Progress Notes (Signed)
PROGRESS NOTE Raymond Azure  BMW:[id number] DOB: April 06, 1927 DOA: 02/05/2023 PCP: Thana Ates, MD  Brief Narrative/Hospital Course:  89 y.o.f with past medical history of IBS, Anxiety, Afib  on eliquis, hx of Gi bleed, anemia presented to the ED with pain and tailbone, unable to ambulate after a fall around April 9 and had imaging done showing fracture of her sacrum, no other falls and per EMS she was able to ambulate to the stretcher but family feels like she is having too much pain to ambulate and admission requested. At home has been taking oxycodone. Family is concerned that she was dropped by EMS onto the stretcher In the ED CT pelvis acute appearing fracture of the sacral ala bilaterally progressing since prior study, degenerative changes in the lower lumbar spine and hips, aortic atherosclerosis. Labs fairly stable   Subjective: Seen and examined Daughter at bedside Asking to go to different unit-not much happy-reports they only have 2 staffs for this unit Reports pain is not well-controlled today, she reports she feels much better yesterday  Assessment and Plan: Principal Problem:   Sacral fracture, closed (HCC) Active Problems:   Persistent atrial fibrillation (HCC)   ANXIETY DEPRESSION   GERD   Protein-calorie malnutrition, severe  Acute appearing sacral alla fracture bilaterally progressing since 01/21/23 Tailbone pain Recent Fall CT pelvis>fracture of the sacral ala bilaterally progressing since prior study.  ED discussed with Dr. Ave Filter from orthopedics advised admission for pain control, PT OT. Patient has better control yesterday but today not well-controlled.  She is Tylenol 1000 mg 3 times daily, lidocaine patch, oxy 5-1- mg prn  and muscle relaxant  and bowel regimen. Patient eager to work with PT OT today Family wondering about 24/7 caregiver TOC notified  Persistent atrial fibrillation:rate controlled, cont Eliquis 2.5 mg. Not on rate controlling  agent GERD continue PPI Anxiety/depression on alprazolam orn and appears anxious IBS: cont symptomatic management.  Severe malnutrition BMI 15, augment diet RD consulted Nutrition Problem: Severe Malnutrition Etiology: social / environmental circumstances (advanced age, lives alone) Signs/Symptoms: severe fat depletion, severe muscle depletion Interventions: Ensure Enlive (each supplement provides 350kcal and 20 grams of protein), Magic cup, Education  DVT prophylaxis: apixaban (ELIQUIS) tablet 2.5 mg Start: 02/05/23 2230 Code Status:   Code Status: Full Code Family Communication: plan of care discussed with patient/son at bedside. Patient status is:  admitted as observation but remains hospitalized for ongoing  because of due to uncontrolled pain Level of care: Med-Surg   Dispo: The patient is from: home             Anticipated disposition: TBD,cont ptot Objective: Vitals last 24 hrs: Vitals:   02/06/23 1557 02/06/23 2035 02/07/23 0701 02/07/23 0800  BP: 110/64 (!) 141/45 (!) 154/80 (!) 144/78  Pulse: (!) 57 71 90 87  Resp:  18 20 16   Temp: 97.9 F (36.6 C) 97.6 F (36.4 C) 97.6 F (36.4 C)   TempSrc: Oral Oral Oral   SpO2: 97% 100% 98% 100%  Weight:      Height:       Weight change:   Physical Examination: General exam: Aaoc3, tremulous, elderly and frail HEENT:Oral mucosa moist, Ear/Nose WNL grossly, dentition normal. Respiratory system: bilaterally clear BS, no use of accessory muscle Cardiovascular system: S1 & S2 +, regular rate. Gastrointestinal system: Abdomen soft, NT,ND,BS+ Nervous System:Alert, awake, moving extremities and grossly nonfocal Extremities: LE ankle edema neg, lower extremities warm Skin: No rashes,no icterus. MSK: Normal muscle bulk,tone, power   Medications  reviewed:  Scheduled Meds:  acetaminophen  1,000 mg Oral TID   apixaban  2.5 mg Oral BID   lidocaine  1 patch Transdermal Q24H   Continuous Infusions:  methocarbamol (ROBAXIN) IV      Unresulted Labs (From admission, onward)     Start     Ordered   02/07/23 0500  Basic metabolic panel  Tomorrow morning,   R        02/06/23 0901   02/07/23 0500  CBC  Tomorrow morning,   R        02/06/23 0901           Data Reviewed: I have personally reviewed following labs and imaging studies CBC: Recent Labs  Lab 02/05/23 1628  WBC 5.7  NEUTROABS 4.3  HGB 11.3*  HCT 37.9  MCV 86.7  PLT 243    Basic Metabolic Panel: Recent Labs  Lab 02/05/23 1628 02/06/23 0329  NA 136  --   K 4.2  --   CL 100  --   CO2 29  --   GLUCOSE 95  --   BUN 16  --   CREATININE 0.58  --   CALCIUM 8.6*  --   MG  --  2.0  PHOS  --  3.6    Recent Labs  Lab 02/05/23 1628  AST 19  ALT 15  ALKPHOS 116  BILITOT 0.6  PROT 6.7  ALBUMIN 3.1*   No results found for this or any previous visit (from the past 240 hour(s)).  Antimicrobials: Anti-infectives (From admission, onward)    None     Culture/Microbiology    Component Value Date/Time   SDES  09/25/2022 1242    BLOOD RIGHT ARM Performed at [facility], [address], [place], [place] [address]    SDES  09/25/2022 1242    SITE NOT SPECIFIED BLOOD Performed at [facility], [address], [place], [place] [address]    SPECREQUEST  09/25/2022 1242    BOTTLES DRAWN AEROBIC ONLY Blood Culture adequate volume Performed at [facility], [address] [place], [place] [address]    SPECREQUEST  09/25/2022 1242    BOTTLES DRAWN AEROBIC ONLY Blood Culture adequate volume Performed at [facility], [address], [place], [place] [address]    CULT  09/25/2022 1242    NO GROWTH 5 DAYS Performed at [facility], [address], [place], [place] [address]    CULT  09/25/2022 1242    NO GROWTH 5 DAYS Performed at [facility], [address] [place], [place] [address]    REPTSTATUS 09/30/2022 FINAL 09/25/2022 1242   REPTSTATUS 09/30/2022  FINAL 09/25/2022 1242   Radiology Studies: CT PELVIS WO CONTRAST  Result Date: 02/05/2023 CLINICAL DATA:  Hip trauma with fracture suspected. X-ray done. Patient fell earlier this month. Fracture to the coccyx. EXAM: CT PELVIS WITHOUT CONTRAST TECHNIQUE: Multidetector CT imaging of the pelvis was performed following the standard protocol without intravenous contrast. RADIATION DOSE REDUCTION: This exam was performed according to the departmental dose-optimization program which includes automated exposure control, adjustment of the mA and/or kV according to patient size and/or use of iterative reconstruction technique. COMPARISON:  CT pelvis 01/21/2023.  No radiographs are available. FINDINGS: Urinary Tract:  No abnormality visualized. Bowel: Diverticulosis of the colon. Scattered stool throughout the colon. No large or small bowel distention or wall thickening. Vascular/Lymphatic: Aortoiliac calcifications.  No aneurysm. Reproductive:  No mass or  other significant abnormality Other: Surgical absence of the gallbladder. Sutures along the midline anterior abdominal wall. No free air or free fluid. Musculoskeletal: Degenerative changes in the lower lumbar spine, hips, and SI joints. Tarlov cysts in the sacrum. Cortical irregularities and depression of the anterior sacral ala bilaterally consistent with acute fractures. There is progressive cortical depression since the prior study. SI joints and symphysis pubis are not displaced. Hips appear intact. IMPRESSION: 1. Acute appearing fractures of the sacral ala bilaterally, progressing since prior study. 2. Degenerative changes in the lower lumbar spine and hips. 3. Aortic atherosclerosis. Electronically Signed   By: Burman Nieves M.D.   On: 02/05/2023 18:16     LOS: 0 days   Lanae Boast, MD [facility]  02/07/2023, 10:26 AM

## 2023-02-07 NOTE — Care Management (Cosign Needed)
The patient is confined to one room  and cannot safely ambulate to the bathroom

## 2023-02-07 NOTE — Progress Notes (Signed)
Physical Therapy Treatment Patient Details Name: Kaitlin [name] MRN: [id number] DOB: 1927/04/17 Today's Date: 02/07/2023   History of Present Illness Pt is a 89 y/o female who presents to the ED with pain and inability to ambulate after a fall around 01/21/2023. Sacral fracture found on imaging that appears to be progressing with prior study. PMH significant for anxiety, a-fib, GIB.    PT Comments    Pt greeted up in recliner, pleasant and agreeable to session with continued progress towards acute goals. Pt needing initially min A to power up to stand to RW, down to min guard with further transfers. Pt with mild c/o dizziness at start of session, BP stable, 157/57 in sitting and 151/80 in standing with symptoms resolving with standing marching. Pt able to progress gait distance in room with RW support and min A to manage RW, with no LOB or buckling noted. Pt daughter present and encouraging throughout session and current plan remains appropriate to address deficits and maximize functional independence and decrease caregiver burden. Pt continues to benefit from skilled PT services to progress toward functional mobility goals.    Recommendations for follow up therapy are one component of a multi-disciplinary discharge planning process, led by the attending physician.  Recommendations may be updated based on patient status, additional functional criteria and insurance authorization.  Follow Up Recommendations       Assistance Recommended at Discharge Frequent or constant Supervision/Assistance  Patient can return home with the following A little help with walking and/or transfers;A little help with bathing/dressing/bathroom;Assistance with cooking/housework;Assist for transportation;Help with stairs or ramp for entrance   Equipment Recommendations  Rolling walker (2 wheels)    Recommendations for Other Services       Precautions / Restrictions Precautions Precautions:  Fall;Back Precaution Booklet Issued: No Precaution Comments: Back precautions for comfort Restrictions Weight Bearing Restrictions: No     Mobility  Bed Mobility Overal bed mobility: Needs Assistance             General bed mobility comments: pt up in chair pre and post session    Transfers Overall transfer level: Needs assistance Equipment used: Rolling walker (2 wheels) Transfers: Sit to/from Stand, Bed to chair/wheelchair/BSC Sit to Stand: Min assist, Min guard           General transfer comment: min A initially to come to stand with assist to power up and steady on rise as pt with c/o dizziness, min guard on subsequent attempts    Ambulation/Gait Ambulation/Gait assistance: Min assist Gait Distance (Feet): 20 Feet Assistive device: Standard walker Gait Pattern/deviations: Step-through pattern, Decreased stride length, Trunk flexed, Narrow base of support Gait velocity: decr     General Gait Details: Slow and guarded due to anticipation of pain. Pt tolerated well without complaints of increased pain. assist for walker management as room with only standard walker avaliable   Stairs             Wheelchair Mobility    Modified Rankin (Stroke Patients Only)       Balance Overall balance assessment: Needs assistance Sitting-balance support: Feet supported, No upper extremity supported Sitting balance-Leahy Scale: Fair     Standing balance support: No upper extremity supported, During functional activity, Reliant on assistive device for balance Standing balance-Leahy Scale: Fair Standing balance comment: Able to maintain static standing at sink  for short bouts without assist or UE support.  Cognition Arousal/Alertness: Awake/alert Behavior During Therapy: WFL for tasks assessed/performed Overall Cognitive Status: Within Functional Limits for tasks assessed                                           Exercises Other Exercises Other Exercises: standing marching x20    General Comments General comments (skin integrity, edema, etc.): pt daughter present and supportive      Pertinent Vitals/Pain Pain Assessment Pain Assessment: Faces Faces Pain Scale: Hurts little more Pain Location: Sacrum occasionally with movement. Reports "shifting" feeling of bones during mobility. Pain Descriptors / Indicators: Sore, Aching, Guarding, Grimacing Pain Intervention(s): Monitored during session, Limited activity within patient's tolerance, RN gave pain meds during session    Home Living                          Prior Function            PT Goals (current goals can now be found in the care plan section) Acute Rehab PT Goals PT Goal Formulation: With patient/family Potential to Achieve Goals: Good Progress towards PT goals: Progressing toward goals    Frequency    Min 3X/week      PT Plan      Co-evaluation              AM-PAC PT "6 Clicks" Mobility   Outcome Measure  Help needed turning from your back to your side while in a flat bed without using bedrails?: A Little Help needed moving from lying on your back to sitting on the side of a flat bed without using bedrails?: A Little Help needed moving to and from a bed to a chair (including a wheelchair)?: A Little Help needed standing up from a chair using your arms (e.g., wheelchair or bedside chair)?: A Little Help needed to walk in hospital room?: A Little Help needed climbing 3-5 steps with a railing? : A Little 6 Click Score: 18    End of Session Equipment Utilized During Treatment: Gait belt Activity Tolerance: Patient tolerated treatment well Patient left: in chair;with call bell/phone within reach;with family/visitor present Nurse Communication: Mobility status PT Visit Diagnosis: Unsteadiness on feet (R26.81);Pain Pain - part of body: Shoulder     Time: 0102-7253 PT Time Calculation (min)  (ACUTE ONLY): 26 min  Charges:  $Gait Training: 8-22 mins $Therapeutic Activity: 8-22 mins                     Hendry Speas R. PTA Acute Rehabilitation Services Office: [phone]    Catalina Antigua 02/07/2023, 11:44 AM

## 2023-02-07 NOTE — TOC Initial Note (Addendum)
Transition of Care [facility]) - Initial/Assessment Note    Patient Details  Name: Kaitlin [name] MRN: [id number] Date of Birth: 1927/08/06  Transition of Care [facility]) CM/SW Contact:    Lockie Pares, RN Phone Number: 02/07/2023, 12:58 PM  Clinical Narrative:                 Consult placed for [facility] for 24/7 care. Called room and daughters phone, [name]. Left confidential voice message to discuss DC planning. 24/7 care would be private pay.   1325 Patients daughter called back. The patient does live alone has rollator  at home. She has and will be staying with her son upon DC, up to this point of fall she has been very independent. Discussed 24/7 care options. They would like home health PT and OT if possible. After the fall she said she had to lift her mother to get her to the rollator  chair then wheel her in the bathroom and lift and pivot. She is also requesting a bedside commode.  They are going to install grab bars and  she has a shower seat. She stated she will have the patients son call  this RNCM to give his address for [facility] and any additional information. The patient has not had HH previously.     Barriers to Discharge: Continued Medical Work up   Patient Goals and CMS Choice            Expected Discharge Plan and Services   Discharge Planning Services: CM Consult   Living arrangements for the past 2 months: Single Family Home                                      Prior Living Arrangements/Services Living arrangements for the past 2 months: Single Family Home Lives with:: Adult Children Patient language and need for interpreter reviewed:: Yes        Need for Family Participation in Patient Care: Yes (Comment) Care giver support system in place?: Yes (comment)   Criminal Activity/Legal Involvement Pertinent to Current Situation/Hospitalization: No - Comment as needed  Activities of Daily Living Home Assistive Devices/Equipment: Cane (specify quad or  straight), Walker (specify type) ADL Screening (condition at time of admission) Patient's cognitive ability adequate to safely complete daily activities?: Yes Is the patient deaf or have difficulty hearing?: No Does the patient have difficulty seeing, even when wearing glasses/contacts?: No Does the patient have difficulty concentrating, remembering, or making decisions?: No Patient able to express need for assistance with ADLs?: Yes Does the patient have difficulty dressing or bathing?: Yes Independently performs ADLs?: No Communication: Independent Dressing (OT): Needs assistance Is this a change from baseline?: Change from baseline, expected to last >3 days Grooming: Needs assistance Is this a change from baseline?: Change from baseline, expected to last >3 days Feeding: Needs assistance Is this a change from baseline?: Change from baseline, expected to last >3 days Bathing: Needs assistance Is this a change from baseline?: Change from baseline, expected to last >3 days Toileting: Needs assistance Is this a change from baseline?: Change from baseline, expected to last >3days In/Out Bed: Needs assistance Is this a change from baseline?: Change from baseline, expected to last >3 days Walks in Home: Needs assistance Is this a change from baseline?: Change from baseline, expected to last >3 days Does the patient have difficulty walking or climbing stairs?: Yes Weakness of Legs:  Both Weakness of Arms/Hands: Both  Permission Sought/Granted                  Emotional Assessment       Orientation: : Oriented to Self Alcohol / Substance Use: Not Applicable Psych Involvement: No (comment)  Admission diagnosis:  Sacral fracture, closed (HCC) [S32.10XA] Sacrum and coccyx fracture, sequela [S32.10XS, S32.2XXS] Fall, subsequent encounter [W19.XXXD] Patient Active Problem List   Diagnosis Date Noted   Protein-calorie malnutrition, severe 02/07/2023   Sacral fracture, closed  (HCC) 02/05/2023   Urinary tract infection without hematuria 10/29/2022   DNR (do not resuscitate) 09/27/2022   Acute pyonephrosis 09/26/2022   Goals of care, counseling/discussion 09/26/2022   Intractable low back pain 09/25/2022   Acute GI bleeding 09/19/2022   Multiple lung nodules on CT 09/19/2022   Acute blood loss anemia 09/19/2022   Chronic pain 09/19/2022   Underweight 09/19/2022   Dysphagia 10/18/2021   Lumbar stenosis with neurogenic claudication 10/29/2018   History of stroke 02/11/2018   Hyperlipidemia 08/13/2017   Fatigue 07/14/2017   Lymphadenopathy 07/14/2017   Fever and chills 07/14/2017   Left leg weakness 03/07/2017   Monoclonal paraproteinemia 03/07/2017   Persistent atrial fibrillation (HCC) 03/07/2017   Esophageal dysmotility 10/18/2014   Osteoarthritis    Tachy-brady syndrome (HCC)    Chest pain    NSTEMI (non-ST elevated myocardial infarction) (HCC) 01/25/2014   Essential hypertension, benign 08/11/2013   Sinus node dysfunction/post termination pauses    Chronic right lower quadrant pain 11/12/2011   COLONIC POLYPS, HX OF 04/12/2010   ANXIETY DEPRESSION 02/07/2009   PERIPHERAL NEUROPATHY 02/07/2009   Mitral valve disorder 02/07/2009   OSTEOARTHRITIS 02/07/2009   Chronic back pain 02/07/2009   INSOMNIA 02/07/2009   GERD 06/23/2008   Irritable bowel syndrome 06/23/2008   Diverticulosis of colon with hemorrhage 12/07/2007   PCP:  Thana Ates, MD Pharmacy:   [id number] - [place], [place] - [address] [place] [address] Phone: [phone] Fax: [phone]  [id number] - [place], [place] - [address] AT [address] [facility] [address] [place] [address] Phone: [phone] Fax: [phone]     Social Determinants of Health (SDOH) Social History: SDOH Screenings   Food Insecurity: No Food Insecurity (02/05/2023)  Housing: Low Risk  (02/05/2023)  Transportation Needs:  No Transportation Needs (02/05/2023)  Utilities: Not At Risk (02/05/2023)  Tobacco Use: Low Risk  (02/05/2023)   SDOH Interventions:     Readmission Risk Interventions    09/25/2022   10:02 AM  Readmission Risk Prevention Plan  Post Dischage Appt Complete  Medication Screening Complete  Transportation Screening Complete

## 2023-02-08 DIAGNOSIS — S3210XG Unspecified fracture of sacrum, subsequent encounter for fracture with delayed healing: Secondary | ICD-10-CM | POA: Diagnosis not present

## 2023-02-08 DIAGNOSIS — S3210XS Unspecified fracture of sacrum, sequela: Secondary | ICD-10-CM | POA: Diagnosis not present

## 2023-02-08 MED ORDER — OXYCODONE HCL ER 10 MG PO T12A: 10.0000 mg | EXTENDED_RELEASE_TABLET | Freq: Two times a day (BID) | ORAL | 0 refills | Status: DC

## 2023-02-08 MED ORDER — BISACODYL 10 MG RE SUPP
10.0000 mg | Freq: Once | RECTAL | Status: AC
  Administered 2023-02-08: 10 mg via RECTAL
  Filled 2023-02-08: qty 1

## 2023-02-08 MED ORDER — METHOCARBAMOL 500 MG PO TABS: 500.0000 mg | ORAL_TABLET | Freq: Four times a day (QID) | ORAL | 0 refills | Status: DC | PRN

## 2023-02-08 MED ORDER — OXYCODONE HCL ER 10 MG PO T12A
10.0000 mg | EXTENDED_RELEASE_TABLET | Freq: Two times a day (BID) | ORAL | Status: DC
  Administered 2023-02-08: 10 mg via ORAL
  Filled 2023-02-08: qty 1

## 2023-02-08 NOTE — Discharge Summary (Addendum)
Physician Discharge Summary  Kaitlin [name] ZOX:[id number] DOB: December 24, 1926 DOA: 02/05/2023  PCP: Thana Ates, MD  Admit date: 02/05/2023 Discharge date: 02/08/2023    Admitted From: Home Disposition: Home  Recommendations for Outpatient Follow-up:  Follow up with PCP in 1-2 weeks Please obtain BMP/CBC in one week Please follow up with your PCP on the following pending results: Unresulted Labs (From admission, onward)    None         Home Health: None Equipment/Devices: None  Discharge Condition: Stable  CODE STATUS: Full code Diet recommendation: Cardiac  Subjective: Patient seen and examined.  She was still complaining of back pain but this was improving.  Had a very lengthy discussion with the son at the bedside, details below.  Brief/Interim Summary: 89 y.o.f with past medical history of IBS, Anxiety, Afib  on eliquis, hx of Gi bleed, anemia presented to the ED with pain and tailbone, unable to ambulate after a fall around April 9 and had imaging done showing fracture of her sacrum, no other falls and per EMS she was able to ambulate to the stretcher but family felt like she was having too much pain to ambulate and admission requested mainly for pain control and to escalate pain medications in controlled environment, per family's request. In the ED CT pelvis acute appearing fracture of the sacral ala bilaterally progressing since prior study.  ED discussed with Dr. Ave Filter from orthopedics advised admission for pain control, was seen by PT OT, they recommended home health PT OT.  However family initially wanted to arrange 24/7 care for the patient.  I saw this patient for the first time and was able to meet her son at the bedside.  He informed me that he now is satisfied that patient's pain is better controlled and they have decided that they would not hire 24 7 care but instead they think they are well capable of taking care of her at home 24/7 by themselves.  After  lengthy discussion about how we can control her pain, we decided to start her on oxycodone 10 mg ER twice daily.  Patient already has immediate release oxycodone 5 mg at home's, almost 10 to 12 tablets.  We mutually decided to discharge her on oxycontin ER 10 mg twice daily for total of 5 days and if needed, she can use short acting oxycodone as well as Robaxin at home.  Per son's request, due to her constipation, will give her Dulcolax suppository and she had a bowel movement as well.  He is now agreeable with the discharge plan.   Persistent atrial fibrillation:rate controlled, cont Eliquis 2.5 mg. Not on rate controlling agent GERD continue PPI Anxiety/depression on alprazolam orn and appears anxious which is her baseline. IBS: cont symptomatic management.   Severe malnutrition BMI 15, augment diet RD consulted Nutrition Problem: Severe Malnutrition Etiology: social / environmental circumstances (advanced age, lives alone) Signs/Symptoms: severe fat depletion, severe muscle depletion Interventions: Ensure Enlive (each supplement provides 350kcal and 20 grams of protein), Magic cup, Education  Discharge plan was discussed with patient and/or family member and they verbalized understanding and agreed with it.  Discharge Diagnoses:  Principal Problem:   Sacral fracture, closed (HCC) Active Problems:   Persistent atrial fibrillation (HCC)   ANXIETY DEPRESSION   GERD   Protein-calorie malnutrition, severe    Discharge Instructions   Allergies as of 02/08/2023       Reactions   Dilaudid [hydromorphone Hcl] Other (See Comments)   Dropped heart rate  really low   Ciprofloxacin Other (See Comments)   Dizziness   Doxycycline    Sever dizziness and nausea   Gabapentin    Loss of balance, "weird feeling"   Codeine Nausea And Vomiting, Rash   Morphine Nausea And Vomiting, Rash   Morphine And Related Nausea And Vomiting, Rash   Nizatidine Other (See Comments)   Unknown reaction          Medication List     TAKE these medications    ALPRAZolam 0.25 MG tablet Commonly known as: XANAX Take 0.125 mg by mouth 3 (three) times daily as needed for anxiety. For sleep   apixaban 2.5 MG Tabs tablet Commonly known as: ELIQUIS Take 1 tablet (2.5 mg total) by mouth 2 (two) times daily.   calcium carbonate 500 MG chewable tablet Commonly known as: TUMS - dosed in mg elemental calcium Chew 1 tablet by mouth 2 (two) times daily.   cholecalciferol 1000 units tablet Commonly known as: VITAMIN D Take 1,000 Units by mouth daily.   methocarbamol 500 MG tablet Commonly known as: ROBAXIN Take 1 tablet (500 mg total) by mouth every 6 (six) hours as needed for muscle spasms.   multivitamin Liqd Take 15 mLs by mouth daily.   nitroGLYCERIN 0.4 MG SL tablet Commonly known as: NITROSTAT Place 0.4 mg under the tongue every 5 (five) minutes as needed for chest pain.   oxyCODONE 5 MG immediate release tablet Commonly known as: Oxy IR/ROXICODONE Take 5 mg by mouth every 6 (six) hours as needed for moderate pain. What changed: Another medication with the same name was added. Make sure you understand how and when to take each.   oxyCODONE 10 mg 12 hr tablet Commonly known as: OXYCONTIN Take 1 tablet (10 mg total) by mouth every 12 (twelve) hours. What changed: You were already taking a medication with the same name, and this prescription was added. Make sure you understand how and when to take each.   polyethylene glycol powder 17 GM/SCOOP powder Commonly known as: MiraLax Take 17 g by mouth 2 (two) times daily as needed for mild constipation.   sennosides-docusate sodium 8.6-50 MG tablet Commonly known as: SENOKOT-S Take 1-2 tablets by mouth daily as needed for constipation.   Super B Complex Tabs Take 1 tablet by mouth daily.               Durable Medical Equipment  (From admission, onward)           Start     Ordered   02/07/23 1340  For home use only DME  Bedside commode  Once       Question:  Patient needs a bedside commode to treat with the following condition  Answer:  Sacral fracture, closed (HCC)   02/07/23 1339            Follow-up Information     Thana Ates, MD Follow up in 1 week(s).   Specialty: Internal Medicine Contact information: [address] [place] [address] [phone]                Allergies  Allergen Reactions   Dilaudid [Hydromorphone Hcl] Other (See Comments)    Dropped heart rate really low   Ciprofloxacin Other (See Comments)    Dizziness   Doxycycline     Sever dizziness and nausea   Gabapentin     Loss of balance, "weird feeling"   Codeine Nausea And Vomiting and Rash   Morphine  Nausea And Vomiting and Rash   Morphine And Related Nausea And Vomiting and Rash   Nizatidine Other (See Comments)    Unknown reaction     Consultations: None   Procedures/Studies: CT PELVIS WO CONTRAST  Result Date: 02/05/2023 CLINICAL DATA:  Hip trauma with fracture suspected. X-ray done. Patient fell earlier this month. Fracture to the coccyx. EXAM: CT PELVIS WITHOUT CONTRAST TECHNIQUE: Multidetector CT imaging of the pelvis was performed following the standard protocol without intravenous contrast. RADIATION DOSE REDUCTION: This exam was performed according to the departmental dose-optimization program which includes automated exposure control, adjustment of the mA and/or kV according to patient size and/or use of iterative reconstruction technique. COMPARISON:  CT pelvis 01/21/2023.  No radiographs are available. FINDINGS: Urinary Tract:  No abnormality visualized. Bowel: Diverticulosis of the colon. Scattered stool throughout the colon. No large or small bowel distention or wall thickening. Vascular/Lymphatic: Aortoiliac calcifications.  No aneurysm. Reproductive:  No mass or other significant abnormality Other: Surgical absence of the gallbladder. Sutures along the midline anterior  abdominal wall. No free air or free fluid. Musculoskeletal: Degenerative changes in the lower lumbar spine, hips, and SI joints. Tarlov cysts in the sacrum. Cortical irregularities and depression of the anterior sacral ala bilaterally consistent with acute fractures. There is progressive cortical depression since the prior study. SI joints and symphysis pubis are not displaced. Hips appear intact. IMPRESSION: 1. Acute appearing fractures of the sacral ala bilaterally, progressing since prior study. 2. Degenerative changes in the lower lumbar spine and hips. 3. Aortic atherosclerosis. Electronically Signed   By: Burman Nieves M.D.   On: 02/05/2023 18:16   CT Chest Wo Contrast  Result Date: 01/21/2023 CLINICAL DATA:  Fall. EXAM: CT CHEST WITHOUT CONTRAST TECHNIQUE: Multidetector CT imaging of the chest was performed following the standard protocol without IV contrast. RADIATION DOSE REDUCTION: This exam was performed according to the departmental dose-optimization program which includes automated exposure control, adjustment of the mA and/or kV according to patient size and/or use of iterative reconstruction technique. COMPARISON:  CT scan of September 25, 2022. PET scan of December 02, 2022. FINDINGS: Cardiovascular: Atherosclerosis of thoracic aorta is noted without aneurysm formation. Mild cardiomegaly is noted. No pericardial effusion is noted. Mediastinum/Nodes: Thyroid gland is unremarkable. No adenopathy is noted. Moderate esophageal dilatation is noted. Lungs/Pleura: No pneumothorax or pleural effusion is noted. Stable biapical scarring is noted. There is again noted irregular lesion in the right upper lobe. It is bilobed in appearance with the most superior component measuring 7 x 6 mm which is not significantly changed compared to prior exam. The more inferior solid component measures 8.5 x 7.0 cm and does appear to be enlarged compared to prior exam where it measures 6 x 5 mm. This is best seen on  image number 36 of series 6. Upper Abdomen: No acute abnormality. Musculoskeletal: No chest wall mass or suspicious bone lesions identified. IMPRESSION: Bilobed irregular part solid lesion seen in the right upper lobe on prior exam is again noted. Its more inferior portion appears to be slightly enlarged and more irregular in appearance, currently measuring 8.5 x 7.0 cm. Previously, this measured 6 x 5 mm. This remains concerning for malignancy. Moderate esophageal dilatation is noted of unknown etiology. Endoscopy is recommended for further evaluation. Stable biapical scarring. Aortic Atherosclerosis (ICD10-I70.0). Electronically Signed   By: Lupita Raider M.D.   On: 01/21/2023 18:59   CT PELVIS WO CONTRAST  Result Date: 01/21/2023 CLINICAL DATA:  Fall earlier this morning.  Pelvic pain. Clinical concern for hip fracture. EXAM: CT PELVIS WITHOUT CONTRAST TECHNIQUE: Multidetector CT imaging of the pelvis was performed following the standard protocol without intravenous contrast. RADIATION DOSE REDUCTION: This exam was performed according to the departmental dose-optimization program which includes automated exposure control, adjustment of the mA and/or kV according to patient size and/or use of iterative reconstruction technique. COMPARISON:  CT abdomen pelvis, 10/15/2022. FINDINGS: Urinary Tract: Normal bladder. Visualized ureters are normal in caliber. Bowel: No bowel obstruction, wall thickening or inflammatory changes. Numerous sigmoid colon diverticula. Vascular/Lymphatic: Aortoiliac atherosclerotic calcifications. No aneurysm. No enlarged lymph nodes. Reproductive:  Status post hysterectomy.  No pelvic mass. Other: Enlarged spleen, similar to the prior CT of the abdomen and pelvis. Musculoskeletal: Subtle cortical breach along the anterior aspect of the sacrum, just below the S2-S3 junction, and at the upper level of a stable spinal canal nerve root sheath cyst. This is only evident on the sagittal  reconstructed images, but appears new from the CT dated 10/15/2022. No other evidence of a fracture. No bone lesion. Hip joints, SI joints and pubic symphysis are normally aligned. IMPRESSION: 1. Subtle evidence of a nondisplaced fracture, transverse across the mid to lower sacrum, as detailed above. 2. No other evidence of a fracture. No other evidence of an acute abnormality. Electronically Signed   By: Amie Portland M.D.   On: 01/21/2023 18:48   CT Cervical Spine Wo Contrast  Result Date: 01/21/2023 CLINICAL DATA:  Trauma EXAM: CT CERVICAL SPINE WITHOUT CONTRAST TECHNIQUE: Multidetector CT imaging of the cervical spine was performed without intravenous contrast. Multiplanar CT image reconstructions were also generated. RADIATION DOSE REDUCTION: This exam was performed according to the departmental dose-optimization program which includes automated exposure control, adjustment of the mA and/or kV according to patient size and/or use of iterative reconstruction technique. COMPARISON:  Chest CT 09/25/2022 FINDINGS: Alignment: No subluxation.  Facet alignment is within normal limits. Skull base and vertebrae: No acute fracture. No primary bone lesion or focal pathologic process. Soft tissues and spinal canal: No prevertebral fluid or swelling. No visible canal hematoma. Disc levels: Multilevel degenerative change with moderate severe disc space narrowing and degenerative change C4-C5, C5-C6 and C6-C7. Facet degenerative changes at multiple levels with foraminal narrowing Upper chest: Apical fibrosis and mild bronchiectasis. Other: None IMPRESSION: Degenerative changes of the cervical spine. No acute osseous abnormality. Electronically Signed   By: Jasmine Pang M.D.   On: 01/21/2023 18:46   CT HEAD WO CONTRAST ( )  Result Date: 01/21/2023 CLINICAL DATA:  Trauma EXAM: CT HEAD WITHOUT CONTRAST TECHNIQUE: Contiguous axial images were obtained from the base of the skull through the vertex without intravenous  contrast. RADIATION DOSE REDUCTION: This exam was performed according to the departmental dose-optimization program which includes automated exposure control, adjustment of the mA and/or kV according to patient size and/or use of iterative reconstruction technique. COMPARISON:  Head CT 03/07/2017 FINDINGS: Brain: No evidence of acute infarction, hemorrhage, hydrocephalus, extra-axial collection or mass lesion/mass effect. Vascular: Atherosclerotic calcifications are present within the cavernous internal carotid arteries. Skull: Normal. Negative for fracture or focal lesion. Sinuses/Orbits: No acute finding. Other: None. IMPRESSION: No acute intracranial abnormality. Electronically Signed   By: Darliss Cheney M.D.   On: 01/21/2023 18:43     Discharge Exam: Vitals:   02/08/23 0643 02/08/23 0801  BP:  (!) 143/95  Pulse:  84  Resp:  16  Temp: 99.7 F (37.6 C) 99.1 F (37.3 C)  SpO2:  97%   Vitals:  02/07/23 0800 02/07/23 2154 02/08/23 0643 02/08/23 0801  BP: (!) 144/78 (!) 143/70  (!) 143/95  Pulse: 87 69  84  Resp: 16 17  16   Temp:  98.2 F (36.8 C) 99.7 F (37.6 C) 99.1 F (37.3 C)  TempSrc:  Oral Oral Oral  SpO2: 100% 99%  97%  Weight:      Height:        General: Pt is alert, awake, not in acute distress Cardiovascular: RRR, S1/S2 +, no rubs, no gallops Respiratory: CTA bilaterally, no wheezing, no rhonchi Abdominal: Soft, NT, ND, bowel sounds + Extremities: no edema, no cyanosis    The results of significant diagnostics from this hospitalization (including imaging, microbiology, ancillary and laboratory) are listed below for reference.     Microbiology: No results found for this or any previous visit (from the past 240 hour(s)).   Labs: BNP (last 3 results) No results for input(s): "BNP" in the last 8760 hours. Basic Metabolic Panel: Recent Labs  Lab 02/05/23 1628 02/06/23 0329 02/07/23 0930  NA 136  --  137  K 4.2  --  4.2  CL 100  --  97*  CO2 29  --  29   GLUCOSE 95  --  100*  BUN 16  --  14  CREATININE 0.58  --  0.73  CALCIUM 8.6*  --  9.4  MG  --  2.0  --   PHOS  --  3.6  --    Liver Function Tests: Recent Labs  Lab 02/05/23 1628  AST 19  ALT 15  ALKPHOS 116  BILITOT 0.6  PROT 6.7  ALBUMIN 3.1*   No results for input(s): "LIPASE", "AMYLASE" in the last 168 hours. No results for input(s): "AMMONIA" in the last 168 hours. CBC: Recent Labs  Lab 02/05/23 1628 02/07/23 0930  WBC 5.7 5.1  NEUTROABS 4.3  --   HGB 11.3* 13.5  HCT 37.9 45.2  MCV 86.7 86.3  PLT 243 297   Cardiac Enzymes: Recent Labs  Lab 02/06/23 0329  CKTOTAL 26*   BNP: Invalid input(s): "POCBNP" CBG: No results for input(s): "GLUCAP" in the last 168 hours. D-Dimer No results for input(s): "DDIMER" in the last 72 hours. Hgb A1c No results for input(s): "HGBA1C" in the last 72 hours. Lipid Profile No results for input(s): "CHOL", "HDL", "LDLCALC", "TRIG", "CHOLHDL", "LDLDIRECT" in the last 72 hours. Thyroid function studies No results for input(s): "TSH", "T4TOTAL", "T3FREE", "THYROIDAB" in the last 72 hours.  Invalid input(s): "FREET3" Anemia work up Recent Labs    02/06/23 0329  VITAMINB12 1,031*  FOLATE 24.0  FERRITIN 33  TIBC 330  IRON 29  RETICCTPCT 1.2   Urinalysis    Component Value Date/Time   COLORURINE YELLOW 02/05/2023 1559   APPEARANCEUR CLEAR 02/05/2023 1559   LABSPEC 1.020 02/05/2023 1559   PHURINE 6.0 02/05/2023 1559   GLUCOSEU NEGATIVE 02/05/2023 1559   HGBUR MODERATE (A) 02/05/2023 1559   BILIRUBINUR NEGATIVE 02/05/2023 1559   KETONESUR NEGATIVE 02/05/2023 1559   PROTEINUR NEGATIVE 02/05/2023 1559   UROBILINOGEN 0.2 01/25/2014 1657   NITRITE NEGATIVE 02/05/2023 1559   LEUKOCYTESUR TRACE (A) 02/05/2023 1559   Sepsis Labs Recent Labs  Lab 02/05/23 1628 02/07/23 0930  WBC 5.7 5.1   Microbiology No results found for this or any previous visit (from the past 240 hour(s)).   Time coordinating discharge:  Over 30 minutes  SIGNED:   Hughie Closs, MD  [facility] 02/08/2023, 3:22 PM *Please note that this  is a verbal dictation therefore any spelling or grammatical errors are due to the "[facility] [facility]" system interpretation. If 7PM-7AM, please contact night-coverage [email or web address]

## 2023-02-08 NOTE — TOC Transition Note (Signed)
Transition of Care [facility]) - CM/SW Discharge Note   Patient Details  Name: Kaitlin [name] MRN: [id number] Date of Birth: 02/18/1927  Transition of Care [facility]) CM/SW Contact:  Lawerance Sabal, RN Phone Number: 02/08/2023, 1:43 PM   Clinical Narrative:     Kaitlin [name] w patient's daughter over the phone who tried to dial in her brother Kaitlin [name] but was not successful. She called him to tell him the patient was getting discharged and she states he said she was not getting DC'd today and hung up on her.   She states that the plan was to have the patient stay with her brother Kaitlin [name], but is deferring choice to him at this time.  Went to patient's room and spoke w Kaitlin [name] and patient who is alert and oriented and able to make her own decisions.  Kaitlin [name] is in the room.  Confirmed with Kaitlin [name] that patient will be staying with him at DC and will have 24/7 support at his [name].  Kaitlin [name] declined all HH, stating he could take care of his mother. He states that he can pick up on therapies at a later time if he thinks she needs them after she is [name]. I explained that he could reach out to her PCP and get an order and the office could assist with the referral. I explained the benefit of having skilled PT at [name], faster recovery, decreased readmission rate, improved outcomes, and the patient engaged to ask more questions. Kaitlin [name] continued to decline services, interrupting our conversation, I responded that I was answering the patient's questions and he stepped out of the room for [place] to talk.  Patient tearful stating she feels like she is in the getting put in the middle between her children, her daughter wanting services and her son not wanting them. I asked her what her preference was and she could not decide, instead concentrating on not wanting to upset her son because she appreciates the care she gives him. She went on for a bit about how attentive both he and his wife are, and that they take care of all of her needs.   I reinforced to her that Kaitlin [name] knows to call her PCP if they change their minds and want HH follow up.  Patient thankful, no other needs identified for DC.    Final next level of care: [name]/Self Care Barriers to Discharge: No Barriers Identified   Patient Goals and CMS Choice      Discharge Placement                         Discharge Plan and Services Additional resources added to the After Visit Summary for     Discharge Planning Services: CM Consult            DME Arranged: Bedside commode DME Agency: [employer] Date DME Agency Contacted: 02/07/23 Time DME Agency Contacted: 1345 Representative spoke with at DME Agency: Barbara Cower HH Arranged: Refused HH          Social Determinants of Health (SDOH) Interventions SDOH Screenings   Food Insecurity: No Food Insecurity (02/05/2023)  Housing: Low Risk  (02/05/2023)  Transportation Needs: No Transportation Needs (02/05/2023)  Utilities: Not At Risk (02/05/2023)  Tobacco Use: Low Risk  (02/05/2023)     Readmission Risk Interventions    09/25/2022   10:02 AM  Readmission Risk Prevention Plan  Post Dischage Appt Complete  Medication Screening Complete  Transportation Screening Complete

## 2023-02-13 ENCOUNTER — Ambulatory Visit (HOSPITAL_BASED_OUTPATIENT_CLINIC_OR_DEPARTMENT_OTHER): Payer: Medicare Other | Admitting: Pulmonary Disease

## 2023-02-17 DIAGNOSIS — R051 Acute cough: Secondary | ICD-10-CM | POA: Diagnosis not present

## 2023-02-17 DIAGNOSIS — S3210XD Unspecified fracture of sacrum, subsequent encounter for fracture with routine healing: Secondary | ICD-10-CM | POA: Diagnosis not present

## 2023-02-17 DIAGNOSIS — G8929 Other chronic pain: Secondary | ICD-10-CM | POA: Diagnosis not present

## 2023-02-20 DIAGNOSIS — S3210XD Unspecified fracture of sacrum, subsequent encounter for fracture with routine healing: Secondary | ICD-10-CM | POA: Diagnosis not present

## 2023-02-20 DIAGNOSIS — K589 Irritable bowel syndrome without diarrhea: Secondary | ICD-10-CM | POA: Diagnosis not present

## 2023-02-20 DIAGNOSIS — Z9181 History of falling: Secondary | ICD-10-CM | POA: Diagnosis not present

## 2023-02-20 DIAGNOSIS — I4819 Other persistent atrial fibrillation: Secondary | ICD-10-CM | POA: Diagnosis not present

## 2023-02-20 DIAGNOSIS — K219 Gastro-esophageal reflux disease without esophagitis: Secondary | ICD-10-CM | POA: Diagnosis not present

## 2023-02-20 DIAGNOSIS — F418 Other specified anxiety disorders: Secondary | ICD-10-CM | POA: Diagnosis not present

## 2023-02-20 DIAGNOSIS — E43 Unspecified severe protein-calorie malnutrition: Secondary | ICD-10-CM | POA: Diagnosis not present

## 2023-02-20 DIAGNOSIS — Z7901 Long term (current) use of anticoagulants: Secondary | ICD-10-CM | POA: Diagnosis not present

## 2023-02-22 DIAGNOSIS — F418 Other specified anxiety disorders: Secondary | ICD-10-CM | POA: Diagnosis not present

## 2023-02-22 DIAGNOSIS — K589 Irritable bowel syndrome without diarrhea: Secondary | ICD-10-CM | POA: Diagnosis not present

## 2023-02-22 DIAGNOSIS — S3210XD Unspecified fracture of sacrum, subsequent encounter for fracture with routine healing: Secondary | ICD-10-CM | POA: Diagnosis not present

## 2023-02-22 DIAGNOSIS — K219 Gastro-esophageal reflux disease without esophagitis: Secondary | ICD-10-CM | POA: Diagnosis not present

## 2023-02-22 DIAGNOSIS — E43 Unspecified severe protein-calorie malnutrition: Secondary | ICD-10-CM | POA: Diagnosis not present

## 2023-02-22 DIAGNOSIS — I4819 Other persistent atrial fibrillation: Secondary | ICD-10-CM | POA: Diagnosis not present

## 2023-02-23 ENCOUNTER — Inpatient Hospital Stay (HOSPITAL_COMMUNITY)
Admission: EM | Admit: 2023-02-23 | Discharge: 2023-03-03 | DRG: 871 | Disposition: A | Discharge status: Home / Self Care | Payer: Medicare Other | Attending: Internal Medicine | Admitting: Internal Medicine

## 2023-02-23 ENCOUNTER — Emergency Department (HOSPITAL_COMMUNITY): Payer: Medicare Other

## 2023-02-23 ENCOUNTER — Encounter (HOSPITAL_COMMUNITY): Payer: Self-pay

## 2023-02-23 DIAGNOSIS — I4891 Unspecified atrial fibrillation: Secondary | ICD-10-CM | POA: Diagnosis not present

## 2023-02-23 DIAGNOSIS — Z9049 Acquired absence of other specified parts of digestive tract: Secondary | ICD-10-CM

## 2023-02-23 DIAGNOSIS — Z8673 Personal history of transient ischemic attack (TIA), and cerebral infarction without residual deficits: Secondary | ICD-10-CM

## 2023-02-23 DIAGNOSIS — K224 Dyskinesia of esophagus: Secondary | ICD-10-CM | POA: Diagnosis present

## 2023-02-23 DIAGNOSIS — Z66 Do not resuscitate: Secondary | ICD-10-CM | POA: Diagnosis present

## 2023-02-23 DIAGNOSIS — R911 Solitary pulmonary nodule: Secondary | ICD-10-CM | POA: Diagnosis present

## 2023-02-23 DIAGNOSIS — J189 Pneumonia, unspecified organism: Secondary | ICD-10-CM | POA: Diagnosis not present

## 2023-02-23 DIAGNOSIS — Z8719 Personal history of other diseases of the digestive system: Secondary | ICD-10-CM

## 2023-02-23 DIAGNOSIS — J9811 Atelectasis: Secondary | ICD-10-CM | POA: Diagnosis present

## 2023-02-23 DIAGNOSIS — Z681 Body mass index (BMI) 19 or less, adult: Secondary | ICD-10-CM | POA: Diagnosis not present

## 2023-02-23 DIAGNOSIS — Z881 Allergy status to other antibiotic agents status: Secondary | ICD-10-CM

## 2023-02-23 DIAGNOSIS — I4510 Unspecified right bundle-branch block: Secondary | ICD-10-CM | POA: Diagnosis present

## 2023-02-23 DIAGNOSIS — R627 Adult failure to thrive: Secondary | ICD-10-CM | POA: Diagnosis present

## 2023-02-23 DIAGNOSIS — W19XXXA Unspecified fall, initial encounter: Secondary | ICD-10-CM | POA: Diagnosis not present

## 2023-02-23 DIAGNOSIS — A419 Sepsis, unspecified organism: Principal | ICD-10-CM | POA: Diagnosis present

## 2023-02-23 DIAGNOSIS — F32A Depression, unspecified: Secondary | ICD-10-CM | POA: Diagnosis present

## 2023-02-23 DIAGNOSIS — I4819 Other persistent atrial fibrillation: Secondary | ICD-10-CM | POA: Diagnosis present

## 2023-02-23 DIAGNOSIS — M8448XA Pathological fracture, other site, initial encounter for fracture: Secondary | ICD-10-CM | POA: Diagnosis not present

## 2023-02-23 DIAGNOSIS — Z888 Allergy status to other drugs, medicaments and biological substances status: Secondary | ICD-10-CM

## 2023-02-23 DIAGNOSIS — R29810 Facial weakness: Secondary | ICD-10-CM | POA: Diagnosis not present

## 2023-02-23 DIAGNOSIS — J9601 Acute respiratory failure with hypoxia: Secondary | ICD-10-CM | POA: Diagnosis not present

## 2023-02-23 DIAGNOSIS — Z885 Allergy status to narcotic agent status: Secondary | ICD-10-CM

## 2023-02-23 DIAGNOSIS — F419 Anxiety disorder, unspecified: Secondary | ICD-10-CM | POA: Insufficient documentation

## 2023-02-23 DIAGNOSIS — Z8249 Family history of ischemic heart disease and other diseases of the circulatory system: Secondary | ICD-10-CM

## 2023-02-23 DIAGNOSIS — I10 Essential (primary) hypertension: Secondary | ICD-10-CM | POA: Diagnosis present

## 2023-02-23 DIAGNOSIS — G8929 Other chronic pain: Secondary | ICD-10-CM | POA: Diagnosis not present

## 2023-02-23 DIAGNOSIS — Z7189 Other specified counseling: Secondary | ICD-10-CM | POA: Diagnosis not present

## 2023-02-23 DIAGNOSIS — R091 Pleurisy: Secondary | ICD-10-CM | POA: Diagnosis not present

## 2023-02-23 DIAGNOSIS — G9340 Encephalopathy, unspecified: Secondary | ICD-10-CM | POA: Diagnosis not present

## 2023-02-23 DIAGNOSIS — E871 Hypo-osmolality and hyponatremia: Secondary | ICD-10-CM | POA: Diagnosis not present

## 2023-02-23 DIAGNOSIS — Z1152 Encounter for screening for COVID-19: Secondary | ICD-10-CM | POA: Diagnosis not present

## 2023-02-23 DIAGNOSIS — R6520 Severe sepsis without septic shock: Secondary | ICD-10-CM | POA: Diagnosis present

## 2023-02-23 DIAGNOSIS — D472 Monoclonal gammopathy: Secondary | ICD-10-CM | POA: Diagnosis present

## 2023-02-23 DIAGNOSIS — J918 Pleural effusion in other conditions classified elsewhere: Secondary | ICD-10-CM | POA: Diagnosis present

## 2023-02-23 DIAGNOSIS — T402X5A Adverse effect of other opioids, initial encounter: Secondary | ICD-10-CM | POA: Diagnosis present

## 2023-02-23 DIAGNOSIS — I495 Sick sinus syndrome: Secondary | ICD-10-CM | POA: Diagnosis present

## 2023-02-23 DIAGNOSIS — Z79899 Other long term (current) drug therapy: Secondary | ICD-10-CM

## 2023-02-23 DIAGNOSIS — R0789 Other chest pain: Secondary | ICD-10-CM | POA: Diagnosis not present

## 2023-02-23 DIAGNOSIS — R0602 Shortness of breath: Secondary | ICD-10-CM | POA: Diagnosis not present

## 2023-02-23 DIAGNOSIS — Z833 Family history of diabetes mellitus: Secondary | ICD-10-CM

## 2023-02-23 DIAGNOSIS — D75839 Thrombocytosis, unspecified: Secondary | ICD-10-CM | POA: Diagnosis present

## 2023-02-23 DIAGNOSIS — R0681 Apnea, not elsewhere classified: Secondary | ICD-10-CM | POA: Diagnosis not present

## 2023-02-23 DIAGNOSIS — Z823 Family history of stroke: Secondary | ICD-10-CM

## 2023-02-23 DIAGNOSIS — K219 Gastro-esophageal reflux disease without esophagitis: Secondary | ICD-10-CM | POA: Diagnosis present

## 2023-02-23 DIAGNOSIS — E878 Other disorders of electrolyte and fluid balance, not elsewhere classified: Secondary | ICD-10-CM | POA: Diagnosis present

## 2023-02-23 DIAGNOSIS — G928 Other toxic encephalopathy: Secondary | ICD-10-CM | POA: Diagnosis present

## 2023-02-23 DIAGNOSIS — R059 Cough, unspecified: Secondary | ICD-10-CM | POA: Diagnosis not present

## 2023-02-23 DIAGNOSIS — K589 Irritable bowel syndrome without diarrhea: Secondary | ICD-10-CM | POA: Diagnosis present

## 2023-02-23 DIAGNOSIS — R918 Other nonspecific abnormal finding of lung field: Secondary | ICD-10-CM | POA: Diagnosis not present

## 2023-02-23 DIAGNOSIS — R262 Difficulty in walking, not elsewhere classified: Secondary | ICD-10-CM | POA: Diagnosis not present

## 2023-02-23 DIAGNOSIS — H9190 Unspecified hearing loss, unspecified ear: Secondary | ICD-10-CM | POA: Diagnosis present

## 2023-02-23 DIAGNOSIS — Z7901 Long term (current) use of anticoagulants: Secondary | ICD-10-CM

## 2023-02-23 DIAGNOSIS — Z515 Encounter for palliative care: Secondary | ICD-10-CM | POA: Diagnosis not present

## 2023-02-23 DIAGNOSIS — R54 Age-related physical debility: Secondary | ICD-10-CM | POA: Diagnosis present

## 2023-02-23 DIAGNOSIS — Z8042 Family history of malignant neoplasm of prostate: Secondary | ICD-10-CM

## 2023-02-23 DIAGNOSIS — R0902 Hypoxemia: Secondary | ICD-10-CM | POA: Diagnosis not present

## 2023-02-23 DIAGNOSIS — R64 Cachexia: Secondary | ICD-10-CM | POA: Diagnosis not present

## 2023-02-23 DIAGNOSIS — I252 Old myocardial infarction: Secondary | ICD-10-CM

## 2023-02-23 DIAGNOSIS — Z8261 Family history of arthritis: Secondary | ICD-10-CM

## 2023-02-23 DIAGNOSIS — J90 Pleural effusion, not elsewhere classified: Secondary | ICD-10-CM

## 2023-02-23 DIAGNOSIS — Z808 Family history of malignant neoplasm of other organs or systems: Secondary | ICD-10-CM

## 2023-02-23 DIAGNOSIS — I2510 Atherosclerotic heart disease of native coronary artery without angina pectoris: Secondary | ICD-10-CM | POA: Diagnosis present

## 2023-02-23 LAB — RESP PANEL BY RT-PCR (RSV, FLU A&B, COVID)  RVPGX2
Influenza A by PCR: NEGATIVE
Influenza B by PCR: NEGATIVE
Resp Syncytial Virus by PCR: NEGATIVE
SARS Coronavirus 2 by RT PCR: NEGATIVE

## 2023-02-23 LAB — COMPREHENSIVE METABOLIC PANEL
ALT: 15 U/L (ref 0–44)
AST: 23 U/L (ref 15–41)
Albumin: 2.4 g/dL — ABNORMAL LOW (ref 3.5–5.0)
Alkaline Phosphatase: 183 U/L — ABNORMAL HIGH (ref 38–126)
Anion gap: 13 (ref 5–15)
BUN: 20 mg/dL (ref 8–23)
CO2: 25 mmol/L (ref 22–32)
Calcium: 8.9 mg/dL (ref 8.9–10.3)
Chloride: 91 mmol/L — ABNORMAL LOW (ref 98–111)
Creatinine, Ser: 0.48 mg/dL (ref 0.44–1.00)
GFR, Estimated: >60 mL/min (ref >60)
Glucose, Bld: 153 mg/dL — ABNORMAL HIGH (ref 70–99)
Potassium: 4.3 mmol/L (ref 3.5–5.1)
Sodium: 129 mmol/L — ABNORMAL LOW (ref 135–145)
Total Bilirubin: 0.6 mg/dL (ref 0.3–1.2)
Total Protein: 6.5 g/dL (ref 6.5–8.1)

## 2023-02-23 LAB — CBC WITH DIFFERENTIAL/PLATELET
Abs Immature Granulocytes: 1.21 10*3/uL — ABNORMAL HIGH (ref 0.00–0.07)
Basophils Absolute: 0.1 10*3/uL (ref 0.0–0.1)
Basophils Relative: 0 %
Eosinophils Absolute: 0 10*3/uL (ref 0.0–0.5)
Eosinophils Relative: 0 %
HCT: 40 % (ref 36.0–46.0)
Hemoglobin: 12.3 g/dL (ref 12.0–15.0)
Immature Granulocytes: 4 %
Lymphocytes Relative: 4 %
Lymphs Abs: 1.2 10*3/uL (ref 0.7–4.0)
MCH: 26 pg (ref 26.0–34.0)
MCHC: 30.8 g/dL (ref 30.0–36.0)
MCV: 84.6 fL (ref 80.0–100.0)
Monocytes Absolute: 1.6 10*3/uL — ABNORMAL HIGH (ref 0.1–1.0)
Monocytes Relative: 5 %
Neutro Abs: 27.4 10*3/uL — ABNORMAL HIGH (ref 1.7–7.7)
Neutrophils Relative %: 87 %
Platelets: 469 10*3/uL — ABNORMAL HIGH (ref 150–400)
RBC: 4.73 MIL/uL (ref 3.87–5.11)
RDW: 16.6 % — ABNORMAL HIGH (ref 11.5–15.5)
WBC: 31.6 10*3/uL — ABNORMAL HIGH (ref 4.0–10.5)
nRBC: 0 % (ref 0.0–0.2)

## 2023-02-23 LAB — BRAIN NATRIURETIC PEPTIDE: B Natriuretic Peptide: 327 pg/mL — ABNORMAL HIGH (ref 0.0–100.0)

## 2023-02-23 LAB — I-STAT CHEM 8, ED
BUN: 24 mg/dL — ABNORMAL HIGH (ref 8–23)
Calcium, Ion: 1.11 mmol/L — ABNORMAL LOW (ref 1.15–1.40)
Chloride: 93 mmol/L — ABNORMAL LOW (ref 98–111)
Creatinine, Ser: 0.4 mg/dL — ABNORMAL LOW (ref 0.44–1.00)
Glucose, Bld: 151 mg/dL — ABNORMAL HIGH (ref 70–99)
HCT: 40 % (ref 36.0–46.0)
Hemoglobin: 13.6 g/dL (ref 12.0–15.0)
Potassium: 4.3 mmol/L (ref 3.5–5.1)
Sodium: 128 mmol/L — ABNORMAL LOW (ref 135–145)
TCO2: 31 mmol/L (ref 22–32)

## 2023-02-23 LAB — PROTIME-INR
INR: 1.7 — ABNORMAL HIGH (ref 0.8–1.2)
Prothrombin Time: 20.4 seconds — ABNORMAL HIGH (ref 11.4–15.2)

## 2023-02-23 MED ORDER — SODIUM CHLORIDE 0.9 % IV SOLN
1.0000 g | Freq: Once | INTRAVENOUS | Status: DC
  Filled 2023-02-23: qty 10

## 2023-02-23 MED ORDER — LACTATED RINGERS IV BOLUS (SEPSIS)
250.0000 mL | Freq: Once | INTRAVENOUS | Status: AC
  Administered 2023-02-24: 250 mL via INTRAVENOUS

## 2023-02-23 MED ORDER — SODIUM CHLORIDE 0.9 % IV SOLN
500.0000 mg | Freq: Once | INTRAVENOUS | Status: DC
  Filled 2023-02-23: qty 5

## 2023-02-23 MED ORDER — LACTATED RINGERS IV BOLUS (SEPSIS)
1000.0000 mL | Freq: Once | INTRAVENOUS | Status: AC
  Administered 2023-02-24: 1000 mL via INTRAVENOUS

## 2023-02-23 MED ORDER — ALPRAZOLAM 0.25 MG PO TABS: 0.2500 mg | ORAL_TABLET | Freq: Once | ORAL | Status: DC

## 2023-02-23 MED ORDER — LACTATED RINGERS IV SOLN: INTRAVENOUS | Status: DC

## 2023-02-23 NOTE — ED Provider Notes (Signed)
[place] EMERGENCY DEPARTMENT AT [facility] Provider Note   CSN: [id number] Arrival date & time: 02/23/23  2205     History {Add pertinent medical, surgical, social history, OB history to HPI:1} Chief Complaint  Patient presents with   Shortness of Breath    Anora Mylisa Zimny is a 89 y.o. female.  89 y/o female with hx of Afib (on Eliquis), tachy/brady syndrome, CVA, IBS, anxiety and depression presents to the ED for SOB.  She was sitting at home when she suddenly became short of breath.  She experienced a cough which was productive of thick, brown mucus.  Son is reportedly a retired Research scientist (medical) and tried to wait it out with the patient for about 40 minutes before calling EMS.  EMS reports sats of 80% on upon their arrival.  They administered 1 DuoNeb as well as 125 mg Solu-Medrol, 2 g IV magnesium with little change in the patient's symptoms.  Roomed in the ED on a nonrebreather with saturations of 100%.  This was quickly titrated down to 3 L with maintenance of oxygen saturations.  She had a recent fall about 4 weeks ago causing a tailbone fracture.  Family reported general failure to thrive and decreased mobility since this incident, decreased oral intake.  She has not had any known fevers, denies significant leg swelling, chest pain, syncope.  Opened discussion with patient and family on code status, given age. Patient currently full code; however, patient and family seem open to modifications to scope of desired treatment/goals.  The history is provided by the patient and the EMS personnel. No language interpreter was used.  Shortness of Breath      Home Medications Prior to Admission medications   Medication Sig Start Date End Date Taking? Authorizing Provider  ALPRAZolam (XANAX) 0.25 MG tablet Take 0.125 mg by mouth 3 (three) times daily as needed for anxiety. For sleep    [provider]  apixaban (ELIQUIS) 2.5 MG TABS tablet Take 1 tablet (2.5 mg  total) by mouth 2 (two) times daily. 11/29/22   Azalee Course, PA  B Complex-C (SUPER B COMPLEX) TABS Take 1 tablet by mouth daily.      [provider]  calcium carbonate (TUMS - DOSED IN MG ELEMENTAL CALCIUM) 500 MG chewable tablet Chew 1 tablet by mouth 2 (two) times daily.    [provider]  cholecalciferol (VITAMIN D) 1000 units tablet Take 1,000 Units by mouth daily.    [provider]  methocarbamol (ROBAXIN) 500 MG tablet Take 1 tablet (500 mg total) by mouth every 6 (six) hours as needed for muscle spasms. 02/08/23   Hughie Closs, MD  Multiple Vitamin (MULTIVITAMIN) LIQD Take 15 mLs by mouth daily. 10/01/22   Jerald Kief, MD  nitroGLYCERIN (NITROSTAT) 0.4 MG SL tablet Place 0.4 mg under the tongue every 5 (five) minutes as needed for chest pain.    [provider]  oxyCODONE (OXY IR/ROXICODONE) 5 MG immediate release tablet Take 5 mg by mouth every 6 (six) hours as needed for moderate pain. 12/01/22   [provider]  oxyCODONE (OXYCONTIN) 10 mg 12 hr tablet Take 1 tablet (10 mg total) by mouth every 12 (twelve) hours. 02/08/23   Hughie Closs, MD  polyethylene glycol powder (MIRALAX) 17 GM/SCOOP powder Take 17 g by mouth 2 (two) times daily as needed for mild constipation. 09/24/22   Almon Hercules, MD  sennosides-docusate sodium (SENOKOT-S) 8.6-50 MG tablet Take 1-2 tablets by mouth daily as  needed for constipation. 09/28/22   Almon Hercules, MD      Allergies    Dilaudid [hydromorphone hcl], Ciprofloxacin, Doxycycline, Gabapentin, Codeine, Morphine, Morphine and related, and Nizatidine    Review of Systems   Review of Systems  Unable to perform ROS: Acuity of condition  Respiratory:  Positive for shortness of breath.     Physical Exam Updated Vital Signs BP (!) 172/76   Pulse (!) 140   Temp (!) 97.4 F (36.3 C) (Oral)   Resp (!) 31   Ht 5\' 3"  (1.6 m)   Wt 39.9 kg   SpO2 98%   BMI 15.58 kg/m   Physical Exam Vitals and nursing  note reviewed.  Constitutional:      General: She is not in acute distress.    Appearance: She is well-developed. She is not diaphoretic.     Comments: Thin/frail, cachectic appearing.  HENT:     Head: Normocephalic and atraumatic.  Eyes:     General: No scleral icterus.    Conjunctiva/sclera: Conjunctivae normal.  Cardiovascular:     Rate and Rhythm: Normal rate. Rhythm irregular.     Comments: 1+ distal radial pulse RUE Pulmonary:     Effort: Tachypnea present.     Breath sounds: No wheezing or rhonchi.     Comments: Mouth breathing, appears dyspneic and tachypneic; however, lungs are grossly clear bilaterally without rales, wheezing, rhonchi. Musculoskeletal:        General: Normal range of motion.     Cervical back: Normal range of motion.     Comments: No pitting edema to BLE  Skin:    General: Skin is warm and dry.     Coloration: Skin is not pale.     Findings: No erythema or rash.  Neurological:     Mental Status: She is alert and oriented to person, place, and time.     Comments: Moving extremities spontaneously.  Psychiatric:        Mood and Affect: Mood is anxious.        Behavior: Behavior normal.     ED Results / Procedures / Treatments   Labs (all labs ordered are listed, but only abnormal results are displayed) Labs Reviewed  COMPREHENSIVE METABOLIC PANEL - Abnormal; Notable for the following components:      Result Value   Sodium 129 (*)    Chloride 91 (*)    Glucose, Bld 153 (*)    Albumin 2.4 (*)    Alkaline Phosphatase 183 (*)    All other components within normal limits  CBC WITH DIFFERENTIAL/PLATELET - Abnormal; Notable for the following components:   WBC 31.6 (*)    RDW 16.6 (*)    Platelets 469 (*)    Neutro Abs 27.4 (*)    Monocytes Absolute 1.6 (*)    Abs Immature Granulocytes 1.21 (*)    All other components within normal limits  BRAIN NATRIURETIC PEPTIDE - Abnormal; Notable for the following components:   B Natriuretic Peptide 327.0  (*)    All other components within normal limits  PROTIME-INR - Abnormal; Notable for the following components:   Prothrombin Time 20.4 (*)    INR 1.7 (*)    All other components within normal limits  I-STAT CHEM 8, ED - Abnormal; Notable for the following components:   Sodium 128 (*)    Chloride 93 (*)    BUN 24 (*)    Creatinine, Ser 0.40 (*)    Glucose, Bld 151 (*)  Calcium, Ion 1.11 (*)    All other components within normal limits  RESP PANEL BY RT-PCR (RSV, FLU A&B, COVID)  RVPGX2  CULTURE, BLOOD (ROUTINE X 2)  CULTURE, BLOOD (ROUTINE X 2)  LACTIC ACID, PLASMA  I-STAT VENOUS BLOOD GAS, ED    EKG EKG Interpretation  Date/Time:  Sunday Feb 23 2023 22:10:48 EDT Ventricular Rate:  92 PR Interval:    QRS Duration: 139 QT Interval:  375 QTC Calculation: 464 R Axis:   119 Text Interpretation: Atrial fibrillation Right bundle branch block Lateral infarct, age indeterminate Anteroseptal infarct, age indeterminate Abnormal ECG Confirmed by Gerhard Munch [phone]) on 02/23/2023 11:27:32 PM  Radiology DG Chest Port 1 View  Result Date: 02/23/2023 CLINICAL DATA:  Shortness of breath.  Fall recently. EXAM: PORTABLE CHEST 1 VIEW COMPARISON:  03/18/2022, 01/21/2023. FINDINGS: The heart borders are obscured and mediastinal contours are within normal limits. There is atherosclerotic calcification of the aorta. Apical pleural scarring is noted bilaterally. There is redemonstration of a nodular density in the right upper lobe. There is patchy airspace disease in the mid to lower lung fields with small to moderate pleural effusions. No acute osseous abnormality. IMPRESSION: 1. Small to moderate bilateral pleural effusions with atelectasis, edema, or infiltrate at the lung bases. 2. Nodular density in the right upper lobe, better evaluated on recent CT. Electronically Signed   By: Thornell Sartorius M.D.   On: 02/23/2023 23:03    Procedures .Critical Care  Performed by: Antony Madura,  PA-C Authorized by: Antony Madura, PA-C   Critical care provider statement:    Critical care time (minutes):  30   Critical care time was exclusive of:  Separately billable procedures and treating other patients   Critical care was necessary to treat or prevent imminent or life-threatening deterioration of the following conditions:  Respiratory failure   Critical care was time spent personally by me on the following activities:  Development of treatment plan with patient or surrogate, discussions with consultants, evaluation of patient's response to treatment, examination of patient, ordering and review of laboratory studies, ordering and review of radiographic studies, ordering and performing treatments and interventions, pulse oximetry, re-evaluation of patient's condition, review of old charts and obtaining history from patient or surrogate   I assumed direction of critical care for this patient from another provider in my specialty: no     Care discussed with: admitting provider     {Document cardiac monitor, telemetry assessment procedure when appropriate:1}  Medications Ordered in ED Medications  ALPRAZolam (XANAX) tablet 0.25 mg (has no administration in time range)  ceFEPIme (MAXIPIME) 1 g in sodium chloride 0.9 % 100 mL IVPB (has no administration in time range)  lactated ringers infusion (has no administration in time range)  lactated ringers bolus 1,000 mL (has no administration in time range)    And  lactated ringers bolus 250 mL (has no administration in time range)    ED Course/ Medical Decision Making/ A&P Clinical Course as of 02/23/23 2357  Sun Feb 23, 2023  2343 Patient maintaining sats of 97% or above on 3L via [place]. Persistent tachypnea, but no overt distress. Continues to appear anxious and is prescribed Xanax TID PRN. Will give dose of home med pending admission.  [KH]  2344 Brain natriuretic peptide(!) [KH]  2345 BNP only 327. Clinically does not appear overtly volume  overloaded. Higher suspicion for PNA given visualized CXR and WBC of 31.6, up from baseline of ~5. [KH]  2354 Case discussed with Dr.  Mansy of [facility] who will assess in the ED for admission. [KH]    Clinical Course User Index [KH] Antony Madura, PA-C   {   Click here for ABCD2, HEART and other calculatorsREFRESH Note before signing :1}                          Medical Decision Making Amount and/or Complexity of Data Reviewed Labs: ordered. Decision-making details documented in ED Course. Radiology: ordered.  Risk Prescription drug management. Decision regarding hospitalization.   ***  {Document critical care time when appropriate:1} {Document review of labs and clinical decision tools ie heart score, Chads2Vasc2 etc:1}  {Document your independent review of radiology images, and any outside records:1} {Document your discussion with family members, caretakers, and with consultants:1} {Document social determinants of health affecting pt's care:1} {Document your decision making why or why not admission, treatments were needed:1} Final Clinical Impression(s) / ED Diagnoses Final diagnoses:  Acute respiratory failure with hypoxia (HCC)  Community acquired pneumonia, unspecified laterality    Rx / DC Orders ED Discharge Orders     None

## 2023-02-23 NOTE — ED Triage Notes (Signed)
Patient was at home and suddenly became [facility], normally on RA Cough with thick, brown mucus, HX of heart failure, recent fall 4 weeks ago with tailbone FX, she has been failure to thrive, decreased oral intake

## 2023-02-24 ENCOUNTER — Inpatient Hospital Stay (HOSPITAL_COMMUNITY): Payer: Medicare Other

## 2023-02-24 ENCOUNTER — Telehealth: Payer: Self-pay | Admitting: Pulmonary Disease

## 2023-02-24 DIAGNOSIS — J9601 Acute respiratory failure with hypoxia: Principal | ICD-10-CM

## 2023-02-24 DIAGNOSIS — A419 Sepsis, unspecified organism: Secondary | ICD-10-CM | POA: Diagnosis not present

## 2023-02-24 DIAGNOSIS — J189 Pneumonia, unspecified organism: Secondary | ICD-10-CM

## 2023-02-24 DIAGNOSIS — E871 Hypo-osmolality and hyponatremia: Secondary | ICD-10-CM

## 2023-02-24 DIAGNOSIS — I2510 Atherosclerotic heart disease of native coronary artery without angina pectoris: Secondary | ICD-10-CM | POA: Insufficient documentation

## 2023-02-24 DIAGNOSIS — F419 Anxiety disorder, unspecified: Secondary | ICD-10-CM | POA: Insufficient documentation

## 2023-02-24 LAB — OSMOLALITY: Osmolality: 285 mOsm/kg (ref 275–295)

## 2023-02-24 LAB — PROTIME-INR
INR: 1.7 — ABNORMAL HIGH (ref 0.8–1.2)
Prothrombin Time: 20.3 seconds — ABNORMAL HIGH (ref 11.4–15.2)

## 2023-02-24 LAB — I-STAT VENOUS BLOOD GAS, ED
Acid-Base Excess: 6 mmol/L — ABNORMAL HIGH (ref 0.0–2.0)
Bicarbonate: 31.2 mmol/L — ABNORMAL HIGH (ref 20.0–28.0)
Calcium, Ion: 1.16 mmol/L (ref 1.15–1.40)
HCT: 40 % (ref 36.0–46.0)
Hemoglobin: 13.6 g/dL (ref 12.0–15.0)
O2 Saturation: 93 %
Potassium: 4.4 mmol/L (ref 3.5–5.1)
Sodium: 129 mmol/L — ABNORMAL LOW (ref 135–145)
TCO2: 33 mmol/L — ABNORMAL HIGH (ref 22–32)
pCO2, Ven: 48.2 mmHg (ref 44–60)
pH, Ven: 7.42 (ref 7.25–7.43)
pO2, Ven: 67 mmHg — ABNORMAL HIGH (ref 32–45)

## 2023-02-24 LAB — CBC
HCT: 36.6 % (ref 36.0–46.0)
Hemoglobin: 11.5 g/dL — ABNORMAL LOW (ref 12.0–15.0)
MCH: 26.5 pg (ref 26.0–34.0)
MCHC: 31.4 g/dL (ref 30.0–36.0)
MCV: 84.3 fL (ref 80.0–100.0)
Platelets: 464 10*3/uL — ABNORMAL HIGH (ref 150–400)
RBC: 4.34 MIL/uL (ref 3.87–5.11)
RDW: 16.5 % — ABNORMAL HIGH (ref 11.5–15.5)
WBC: 34.6 10*3/uL — ABNORMAL HIGH (ref 4.0–10.5)
nRBC: 0 % (ref 0.0–0.2)

## 2023-02-24 LAB — OSMOLALITY, URINE: Osmolality, Ur: 886 mOsm/kg (ref 300–900)

## 2023-02-24 LAB — BASIC METABOLIC PANEL
Anion gap: 12 (ref 5–15)
BUN: 17 mg/dL (ref 8–23)
CO2: 26 mmol/L (ref 22–32)
Calcium: 8.5 mg/dL — ABNORMAL LOW (ref 8.9–10.3)
Chloride: 90 mmol/L — ABNORMAL LOW (ref 98–111)
Creatinine, Ser: 0.47 mg/dL (ref 0.44–1.00)
GFR, Estimated: >60 mL/min (ref >60)
Glucose, Bld: 153 mg/dL — ABNORMAL HIGH (ref 70–99)
Potassium: 4.2 mmol/L (ref 3.5–5.1)
Sodium: 128 mmol/L — ABNORMAL LOW (ref 135–145)

## 2023-02-24 LAB — STREP PNEUMONIAE URINARY ANTIGEN: Strep Pneumo Urinary Antigen: NEGATIVE

## 2023-02-24 LAB — LACTIC ACID, PLASMA: Lactic Acid, Venous: 1.7 mmol/L (ref 0.5–1.9)

## 2023-02-24 LAB — CULTURE, BLOOD (ROUTINE X 2)

## 2023-02-24 LAB — SODIUM, URINE, RANDOM: Sodium, Ur: 33 mmol/L

## 2023-02-24 LAB — PROCALCITONIN: Procalcitonin: 0.2 ng/mL

## 2023-02-24 LAB — CORTISOL-AM, BLOOD: Cortisol - AM: 38.4 ug/dL — ABNORMAL HIGH (ref 6.7–22.6)

## 2023-02-24 MED ORDER — SODIUM CHLORIDE 0.9 % IV SOLN
2.0000 g | INTRAVENOUS | Status: AC
  Administered 2023-02-24 – 2023-02-27 (×4): 2 g via INTRAVENOUS
  Filled 2023-02-24 (×4): qty 20

## 2023-02-24 MED ORDER — OXYCODONE HCL ER 10 MG PO T12A: 10.0000 mg | EXTENDED_RELEASE_TABLET | Freq: Every evening | ORAL | Status: DC | PRN

## 2023-02-24 MED ORDER — ACETAMINOPHEN 325 MG PO TABS
650.0000 mg | ORAL_TABLET | Freq: Four times a day (QID) | ORAL | Status: DC | PRN
  Administered 2023-02-24 – 2023-02-26 (×6): 650 mg via ORAL
  Filled 2023-02-24 (×6): qty 2

## 2023-02-24 MED ORDER — OXYCODONE HCL 5 MG PO TABS
5.0000 mg | ORAL_TABLET | Freq: Three times a day (TID) | ORAL | Status: DC
  Administered 2023-02-24 (×2): 5 mg via ORAL
  Filled 2023-02-24 (×2): qty 1

## 2023-02-24 MED ORDER — ALPRAZOLAM 0.25 MG PO TABS
0.1250 mg | ORAL_TABLET | Freq: Three times a day (TID) | ORAL | Status: DC | PRN
  Administered 2023-02-25 – 2023-03-02 (×9): 0.125 mg via ORAL
  Filled 2023-02-24 (×10): qty 1

## 2023-02-24 MED ORDER — METHOCARBAMOL 500 MG PO TABS
500.0000 mg | ORAL_TABLET | Freq: Four times a day (QID) | ORAL | Status: DC | PRN
  Administered 2023-02-24 – 2023-03-01 (×3): 500 mg via ORAL
  Filled 2023-02-24 (×3): qty 1

## 2023-02-24 MED ORDER — LABETALOL HCL 5 MG/ML IV SOLN
20.0000 mg | INTRAVENOUS | Status: DC | PRN
  Administered 2023-02-24: 20 mg via INTRAVENOUS
  Filled 2023-02-24: qty 4

## 2023-02-24 MED ORDER — GUAIFENESIN ER 600 MG PO TB12
600.0000 mg | ORAL_TABLET | Freq: Two times a day (BID) | ORAL | Status: DC
  Administered 2023-02-24 – 2023-03-03 (×14): 600 mg via ORAL
  Filled 2023-02-24 (×15): qty 1

## 2023-02-24 MED ORDER — ONDANSETRON HCL 4 MG PO TABS: 4.0000 mg | ORAL_TABLET | Freq: Four times a day (QID) | ORAL | Status: DC | PRN

## 2023-02-24 MED ORDER — ONDANSETRON HCL 4 MG/2ML IJ SOLN: 4.0000 mg | Freq: Four times a day (QID) | INTRAMUSCULAR | Status: DC | PRN

## 2023-02-24 MED ORDER — POLYETHYLENE GLYCOL 3350 17 G PO PACK
17.0000 g | PACK | Freq: Two times a day (BID) | ORAL | Status: DC | PRN
  Administered 2023-02-26 – 2023-02-27 (×2): 17 g via ORAL
  Filled 2023-02-24 (×2): qty 1

## 2023-02-24 MED ORDER — TRAZODONE HCL 50 MG PO TABS
25.0000 mg | ORAL_TABLET | Freq: Every evening | ORAL | Status: DC | PRN
  Administered 2023-03-02: 25 mg via ORAL
  Filled 2023-02-24 (×2): qty 1

## 2023-02-24 MED ORDER — SENNOSIDES-DOCUSATE SODIUM 8.6-50 MG PO TABS
1.0000 | ORAL_TABLET | Freq: Every day | ORAL | Status: DC | PRN
  Administered 2023-02-27: 2 via ORAL
  Filled 2023-02-24: qty 2

## 2023-02-24 MED ORDER — CALCIUM CARBONATE ANTACID 500 MG PO CHEW
1.0000 | CHEWABLE_TABLET | Freq: Two times a day (BID) | ORAL | Status: DC
  Administered 2023-02-24 – 2023-03-02 (×11): 200 mg via ORAL
  Filled 2023-02-24 (×14): qty 1

## 2023-02-24 MED ORDER — OXYCODONE HCL ER 10 MG PO T12A
10.0000 mg | EXTENDED_RELEASE_TABLET | Freq: Two times a day (BID) | ORAL | Status: DC
  Administered 2023-02-24: 10 mg via ORAL
  Filled 2023-02-24: qty 1

## 2023-02-24 MED ORDER — IOHEXOL 350 MG/ML SOLN
75.0000 mL | Freq: Once | INTRAVENOUS | Status: AC | PRN
  Administered 2023-02-24: 75 mL via INTRAVENOUS

## 2023-02-24 MED ORDER — IPRATROPIUM-ALBUTEROL 0.5-2.5 (3) MG/3ML IN SOLN: 3.0000 mL | Freq: Four times a day (QID) | RESPIRATORY_TRACT | Status: DC

## 2023-02-24 MED ORDER — NITROGLYCERIN 0.4 MG SL SUBL: 0.4000 mg | SUBLINGUAL_TABLET | SUBLINGUAL | Status: DC | PRN

## 2023-02-24 MED ORDER — ACETAMINOPHEN 650 MG RE SUPP
650.0000 mg | Freq: Four times a day (QID) | RECTAL | Status: DC | PRN
  Filled 2023-02-24: qty 1

## 2023-02-24 MED ORDER — OXYCODONE HCL 5 MG PO TABS
5.0000 mg | ORAL_TABLET | Freq: Four times a day (QID) | ORAL | Status: DC | PRN
  Administered 2023-02-24: 5 mg via ORAL
  Filled 2023-02-24: qty 1

## 2023-02-24 MED ORDER — LACTATED RINGERS IV SOLN
150.0000 mL/h | INTRAVENOUS | Status: DC
  Administered 2023-02-24 (×2): 150 mL/h via INTRAVENOUS

## 2023-02-24 MED ORDER — SODIUM CHLORIDE 0.9 % IV SOLN
500.0000 mg | INTRAVENOUS | Status: AC
  Administered 2023-02-24 – 2023-02-28 (×5): 500 mg via INTRAVENOUS
  Filled 2023-02-24 (×5): qty 5

## 2023-02-24 MED ORDER — SODIUM CHLORIDE 0.9 % IV SOLN
2.0000 g | Freq: Once | INTRAVENOUS | Status: AC
  Administered 2023-02-24: 2 g via INTRAVENOUS
  Filled 2023-02-24: qty 20

## 2023-02-24 MED ORDER — SODIUM CHLORIDE 0.9 % IV SOLN: INTRAVENOUS | Status: DC

## 2023-02-24 MED ORDER — HYDROCOD POLI-CHLORPHE POLI ER 10-8 MG/5ML PO SUER: 5.0000 mL | Freq: Two times a day (BID) | ORAL | Status: DC | PRN

## 2023-02-24 MED ORDER — APIXABAN 2.5 MG PO TABS
2.5000 mg | ORAL_TABLET | Freq: Two times a day (BID) | ORAL | Status: DC
  Administered 2023-02-24 (×2): 2.5 mg via ORAL
  Filled 2023-02-24 (×2): qty 1

## 2023-02-24 MED ORDER — IPRATROPIUM-ALBUTEROL 0.5-2.5 (3) MG/3ML IN SOLN
3.0000 mL | Freq: Four times a day (QID) | RESPIRATORY_TRACT | Status: DC
  Administered 2023-02-24 – 2023-02-25 (×5): 3 mL via RESPIRATORY_TRACT
  Filled 2023-02-24 (×6): qty 3

## 2023-02-24 NOTE — Sepsis Progress Note (Signed)
[facility] following for sepsis protocol. 
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
## 2023-02-24 NOTE — Assessment & Plan Note (Signed)
-   This is clearly secondary to her pneumonia and sepsis. - O2 protocol will be followed.

## 2023-02-24 NOTE — Telephone Encounter (Signed)
Pt daughter Malachi Bonds [phone] is calling because her mom was sent to hospital because she was not breathing good, back on April 9th she fell and broke her tailbone. On April 24 th she had a full body CT scan and they seen the spot on her lung had grew. Pt has been immobile due to her fall. Pt daughter states she has been coughing up a lot of phlegm, oyster sized like, yellow and greenish. Pt is currently in hospital. Her daughter is truly concerned about her mothers health due to her being 89 years old.

## 2023-02-24 NOTE — Assessment & Plan Note (Signed)
-   We will continue her Eliquis. 

## 2023-02-24 NOTE — Assessment & Plan Note (Signed)
-   We will continue as needed sublingual nitroglycerin.

## 2023-02-24 NOTE — Assessment & Plan Note (Signed)
-   This is likely hypovolemic. - She will be hydrated with IV normal saline and will follow BMP. 

## 2023-02-24 NOTE — Assessment & Plan Note (Addendum)
-   Sepsis is manifested by significant cytosis, tachycardia and tachypnea. - She meets severe criteria given associated acute hypoxic respiratory failure. - She will be admitted to a medical telemetry bed. - Will continue antibiotic therapy with IV Rocephin and Zithromax for likely community-acquired multifocal pneumonia. - We will follow blood cultures and and obtain sputum culture. - Mucolytic therapy and bronchodilator therapy will be provided. - She will be hydrated with IV normal saline.

## 2023-02-24 NOTE — H&P (Incomplete)
[place]   PATIENT NAME: Kaitlin [name]    MR#:  [id number]  DATE OF BIRTH:  [birth year]  DATE OF ADMISSION:  02/23/2023  PRIMARY CARE PHYSICIAN: Thana Ates, MD   Patient is coming from: Home  REQUESTING/REFERRING PHYSICIAN: Antony Madura, PA-C   CHIEF COMPLAINT:   Chief Complaint  Patient presents with   Shortness of Breath    HISTORY OF PRESENT ILLNESS:  Kaitlin [name] is a 88 y.o. female with medical history significant for atrial fibrillation, anxiety and depression, GERD, diverticulosis, MI thought to be related to coronary spasm with history of atherosclerosis, IBS, peripheral neuropathy, tachybradycardia syndrome, essential hypertension,, who presented to emergency room with acute onset of worsening dyspnea with associated cough productive of thick brown sputum without significant wheezing since Friday.  She was initially thought to have symptoms related to allergy and was given Zyrtec by her PCP.  She was noted to be hypoxic with a pulse oximetry of 80% on room air.  She was given 1 DuoNeb as well as 125 mg of IV Solu-Medrol and 2 g of IV magnesium sulfate with little change in her symptoms.  She came to the ER on 100% nonrebreather with pulse oximetry of 100%.  She was then tapered down to 3 L of O2 by nasal cannula maintaining her pulse oximetry.  She had a recent fall of 4 weeks ago with subsequent coccygeal fracture for which she was admitted to the ER.  Since then she has been having failed to thrive and decreased mobility with decreased p.o. intake.  She denies any fever or chills.  No nausea or vomiting or abdominal pain.  No dysuria, oliguria or hematuria or flank pain.  . ED Course: On presentation to the emergency room, BP was 184/91 with respiratory rate of 25 and temperature 97.4 and pulse symmetry was 99% on 4 L O2 via nasal cannula.  VBG showed pH 7.42 with HCO3 of 31.2.  CMP was remarkable for hyponatremia 129, hypochloremia 91 and a blood glucose of  153, alkaline phosphatase of 183 and albumin 2.4 with a BNP of 327 and CBC showing left significant leukocytosis of 31.6 with neutrophilia and thrombocytosis ptosis of 469.  INR was 1.7 and PT 20.4.  EKG as reviewed by me : EKG showed atrial fibrillation with controlled ventricular sponsor 92, right bundle branch block and anteroseptal Q waves with T wave inversion laterally. Imaging: Portable chest ray showed: 1. Small to moderate bilateral pleural effusions with atelectasis, edema, or infiltrate at the lung bases. 2. Nodular density in the right upper lobe, better evaluated on recent CT.  The patient was initially ordered IV cefepime and vancomycin, 250 mL IV lactated ringer bolus followed by 150/h and 0.25 mg of p.o. Xanax.  She will be admitted to a medical telemetry bed for further evaluation and management. PAST MEDICAL HISTORY:   Past Medical History:  Diagnosis Date   Anxiety and depression    Atrial fibrillation (HCC)    Chest pain    a. 03/2005 MV: Ef 79%, no ischemia.   CVA (cerebral vascular accident) (HCC)    small lacunar infarcts in the left cerebellum and pons in 02/2017   Diverticulosis    Fever, recurrent    GERD (gastroesophageal reflux disease)    Heart attack (HCC) 01/2014   widely patent cors, ?spasm   History of colon polyps    IBS (irritable bowel syndrome)    Insomnia    Laryngeal trauma  penetration   Lumbar back pain    Osteoarthritis    Peripheral neuropathy    Tachy-brady syndrome (HCC)    a. Post termination of 4 seconds - refused PPM.    PAST SURGICAL HISTORY:   Past Surgical History:  Procedure Laterality Date   ABDOMINAL HYSTERECTOMY     BACK SURGERY     CHOLECYSTECTOMY     COLONOSCOPY  05/17/2010   diverticulosis   ESOPHAGOGASTRODUODENOSCOPY N/A 09/19/2022   Procedure: ESOPHAGOGASTRODUODENOSCOPY (EGD);  Surgeon: Iva Boop, MD;  Location: [facility];  Service: Gastroenterology;  Laterality: N/A;   FLEXIBLE SIGMOIDOSCOPY N/A  09/19/2022   Procedure: FLEXIBLE SIGMOIDOSCOPY;  Surgeon: Iva Boop, MD;  Location: [facility];  Service: Gastroenterology;  Laterality: N/A;   LAPAROTOMY     LEFT HEART CATHETERIZATION WITH CORONARY ANGIOGRAM N/A 01/26/2014   Procedure: LEFT HEART CATHETERIZATION WITH CORONARY ANGIOGRAM;  Surgeon: Lesleigh Noe, MD;  Location: [facility] CATH LAB;  Service: Cardiovascular;  Laterality: N/A;   NOSE SURGERY     UPPER GASTROINTESTINAL ENDOSCOPY  02/03/2007   normal    SOCIAL HISTORY:   Social History   Tobacco Use   Smoking status: Never   Smokeless tobacco: Never   Tobacco comments:    Worked around smokers from [place] to 1991  Substance Use Topics   Alcohol use: No    FAMILY HISTORY:   Family History  Problem Relation Age of Onset   Thyroid cancer Sister    Diabetes Father    Heart disease Mother    Heart disease Sister    Healthy Brother    Healthy Sister    Healthy Brother    Arthritis Brother    Stroke Brother    Other Brother        stomach issues   Bone cancer Maternal Grandfather    Prostate cancer Paternal Grandfather    Colon cancer Neg Hx    Neuropathy Neg Hx    Esophageal cancer Neg Hx    Stomach cancer Neg Hx     DRUG ALLERGIES:   Allergies  Allergen Reactions   Dilaudid [Hydromorphone Hcl] Other (See Comments)    Dropped heart rate really low   Ciprofloxacin Other (See Comments)    Dizziness   Doxycycline     Sever dizziness and nausea   Gabapentin     Loss of balance, "weird feeling"   Codeine Nausea And Vomiting and Rash   Morphine Nausea And Vomiting and Rash   Morphine And Related Nausea And Vomiting and Rash   Nizatidine Other (See Comments)    Unknown reaction     REVIEW OF SYSTEMS:   ROS As per history of present illness. All pertinent systems were reviewed above. Constitutional, HEENT, cardiovascular, respiratory, GI, GU, musculoskeletal, neuro, psychiatric, endocrine, integumentary and hematologic systems were reviewed and are  otherwise negative/unremarkable except for positive findings mentioned above in the HPI.   MEDICATIONS AT HOME:   Prior to Admission medications   Medication Sig Start Date End Date Taking? Authorizing Provider  ALPRAZolam (XANAX) 0.25 MG tablet Take 0.125 mg by mouth 3 (three) times daily as needed for anxiety. For sleep    [provider]  apixaban (ELIQUIS) 2.5 MG TABS tablet Take 1 tablet (2.5 mg total) by mouth 2 (two) times daily. 11/29/22   Azalee Course, PA  B Complex-C (SUPER B COMPLEX) TABS Take 1 tablet by mouth daily.      [provider]  calcium carbonate (TUMS - DOSED IN MG  ELEMENTAL CALCIUM) 500 MG chewable tablet Chew 1 tablet by mouth 2 (two) times daily.    [provider]  cholecalciferol (VITAMIN D) 1000 units tablet Take 1,000 Units by mouth daily.    [provider]  methocarbamol (ROBAXIN) 500 MG tablet Take 1 tablet (500 mg total) by mouth every 6 (six) hours as needed for muscle spasms. 02/08/23   Hughie Closs, MD  Multiple Vitamin (MULTIVITAMIN) LIQD Take 15 mLs by mouth daily. 10/01/22   Jerald Kief, MD  nitroGLYCERIN (NITROSTAT) 0.4 MG SL tablet Place 0.4 mg under the tongue every 5 (five) minutes as needed for chest pain.    [provider]  oxyCODONE (OXY IR/ROXICODONE) 5 MG immediate release tablet Take 5 mg by mouth every 6 (six) hours as needed for moderate pain. 12/01/22   [provider]  oxyCODONE (OXYCONTIN) 10 mg 12 hr tablet Take 1 tablet (10 mg total) by mouth every 12 (twelve) hours. 02/08/23   Hughie Closs, MD  polyethylene glycol powder (MIRALAX) 17 GM/SCOOP powder Take 17 g by mouth 2 (two) times daily as needed for mild constipation. 09/24/22   Almon Hercules, MD  sennosides-docusate sodium (SENOKOT-S) 8.6-50 MG tablet Take 1-2 tablets by mouth daily as needed for constipation. 09/28/22   Almon Hercules, MD      VITAL SIGNS:  Blood pressure (!) 165/73, pulse 79, temperature (!) 97.4 F (36.3 C),  temperature source Oral, resp. rate 19, height 5\' 3"  (1.6 m), weight 39.9 kg, SpO2 100 %.  PHYSICAL EXAMINATION:  Physical Exam  GENERAL: Ill looking 89 y.o.-year-old patient lying in the bed with moderate respiratory distress  with conversational dyspnea. EYES: Pupils equal, round, reactive to light and accommodation. No scleral icterus. Extraocular muscles intact.  HEENT: Head atraumatic, normocephalic. Oropharynx and nasopharynx clear.  NECK:  Supple, no jugular venous distention. No thyroid enlargement, no tenderness.  LUNGS: Diminished bibasal breath sounds with bibasal crackles. No use of accessory muscles of respiration.  CARDIOVASCULAR: Regular rate and rhythm, S1, S2 normal. No murmurs, rubs, or gallops.  ABDOMEN: Soft, nondistended, nontender. Bowel sounds present. No organomegaly or mass.  EXTREMITIES: No pedal edema, cyanosis, or clubbing.  NEUROLOGIC: Cranial nerves II through XII are intact. Muscle strength 5/5 in all extremities. Sensation intact. Gait not checked.  PSYCHIATRIC: The patient is alert and oriented x 3.  Normal affect and good eye contact. SKIN: No obvious rash, lesion, or ulcer.   LABORATORY PANEL:   CBC Recent Labs  Lab 02/23/23 2221 02/23/23 2247 02/24/23 0004  WBC 31.6*  --   --   HGB 12.3   < > 13.6  HCT 40.0   < > 40.0  PLT 469*  --   --    < > = values in this interval not displayed.   ------------------------------------------------------------------------------------------------------------------  Chemistries  Recent Labs  Lab 02/23/23 2221 02/23/23 2247 02/24/23 0004  NA 129* 128* 129*  K 4.3 4.3 4.4  CL 91* 93*  --   CO2 25  --   --   GLUCOSE 153* 151*  --   BUN 20 24*  --   CREATININE 0.48 0.40*  --   CALCIUM 8.9  --   --   AST 23  --   --   ALT 15  --   --   ALKPHOS 183*  --   --   BILITOT 0.6  --   --     ------------------------------------------------------------------------------------------------------------------  Cardiac Enzymes No results for input(s): "TROPONINI" in  the last 168 hours. ------------------------------------------------------------------------------------------------------------------  RADIOLOGY:  DG Chest Port 1 View  Result Date: 02/23/2023 CLINICAL DATA:  Shortness of breath.  Fall recently. EXAM: PORTABLE CHEST 1 VIEW COMPARISON:  03/18/2022, 01/21/2023. FINDINGS: The heart borders are obscured and mediastinal contours are within normal limits. There is atherosclerotic calcification of the aorta. Apical pleural scarring is noted bilaterally. There is redemonstration of a nodular density in the right upper lobe. There is patchy airspace disease in the mid to lower lung fields with small to moderate pleural effusions. No acute osseous abnormality. IMPRESSION: 1. Small to moderate bilateral pleural effusions with atelectasis, edema, or infiltrate at the lung bases. 2. Nodular density in the right upper lobe, better evaluated on recent CT. Electronically Signed   By: Thornell Sartorius M.D.   On: 02/23/2023 23:03      IMPRESSION AND PLAN:  Assessment and Plan: * Sepsis due to pneumonia (HCC) - Sepsis is manifested by significant cytosis, tachycardia and tachypnea. - She meets severe criteria given associated acute hypoxic respiratory failure. - She will be admitted to a medical telemetry bed. - Will continue antibiotic therapy with IV Rocephin and Zithromax for likely community-acquired multifocal pneumonia. - We will follow blood cultures and and obtain sputum culture. - Mucolytic therapy and bronchodilator therapy will be provided. - She will be hydrated with IV normal saline.  Acute respiratory failure with hypoxia (HCC) - This is clearly secondary to her pneumonia and sepsis. - O2 protocol will be followed.  Hyponatremia - This is likely hypovolemic. - She will  be hydrated with IV normal saline and will follow BMP.  Persistent atrial fibrillation (HCC) - We will continue her Eliquis.  Coronary artery disease - We will continue as needed sublingual nitroglycerin.  Anxiety - We will continue Xanax.   DVT prophylaxis: Eliquis. Advanced Care Planning:  Code Status: She is DNR.  This was discussed with her son.  They wanted her to be intubated in case of respiratory failure/arrest without cardiac arrest. Family Communication:  The plan of care was discussed in details with the patient (and family). I answered all questions. The patient agreed to proceed with the above mentioned plan. Further management will depend upon hospital course. Disposition Plan: Back to previous home environment Consults called: none. All the records are reviewed and case discussed with ED provider.  Status is: Inpatient   At the time of the admission, it appears that the appropriate admission status for this patient is inpatient.  This is judged to be reasonable and necessary in order to provide the required intensity of service to ensure the patient's safety given the presenting symptoms, physical exam findings and initial radiographic and laboratory data in the context of comorbid conditions.  The patient requires inpatient status due to high intensity of service, high risk of further deterioration and high frequency of surveillance required.  I certify that at the time of admission, it is my clinical judgment that the patient will require inpatient hospital care extending more than 2 midnights.                            Dispo: The patient is from: Home              Anticipated d/c is to: Home              Patient currently is not medically stable to d/c.  Difficult to place patient: No  Hannah Beat M.D on 02/24/2023 at 2:56 AM  [facility]   From 7 PM-7 AM, contact night-coverage [email or web address]  CC: Primary care physician; Thana Ates,  MD

## 2023-02-24 NOTE — Assessment & Plan Note (Addendum)
-   We will continue Xanax. 

## 2023-02-24 NOTE — Progress Notes (Signed)
PROGRESS NOTE    Kaitlin [name]  ZOX:[id number] DOB: 1926/12/26 DOA: 02/23/2023 PCP: Thana Ates, MD     Brief Narrative:   From admission h and p  Kaitlin [name] is a 89 y.o. female with medical history significant for atrial fibrillation, anxiety and depression, GERD, diverticulosis, MI thought to be related to coronary spasm with history of atherosclerosis, IBS, peripheral neuropathy, tachybradycardia syndrome, essential hypertension,, who presented to emergency room with acute onset of worsening dyspnea with associated cough productive of thick brown sputum without significant wheezing since Friday.  She was initially thought to have symptoms related to allergy and was given Zyrtec by her PCP.  She was noted to be hypoxic with a pulse oximetry of 80% on room air.  She was given 1 DuoNeb as well as 125 mg of IV Solu-Medrol and 2 g of IV magnesium sulfate with little change in her symptoms.  She came to the ER on 100% nonrebreather with pulse oximetry of 100%.  She was then tapered down to 3 L of O2 by nasal cannula maintaining her pulse oximetry.  She had a recent fall of 4 weeks ago with subsequent coccygeal fracture for which she was admitted to the ER.  Since then she has been having failed to thrive and decreased mobility with decreased p.o. intake.  She denies any fever or chills.  No nausea or vomiting or abdominal pain.  No dysuria, oliguria or hematuria or flank pain.  .   Assessment & Plan:   Principal Problem:   Sepsis due to pneumonia [facility]) Active Problems:   Acute respiratory failure with hypoxia (HCC)   Hyponatremia   Persistent atrial fibrillation (HCC)   Anxiety   Coronary artery disease   Community acquired pneumonia  # CAP Leukocytosis, productive cough, possible infiltrate on x-ray, with effusions - cont ceftriaxone/azithromycin - f/u CTA - f/u blood cultures, ngtd - f/u urine antigens legionella and strep  # Lung mass RUL, recent CT showed  progression in size, currently being worked up outpatient by pulm - CTA to further characterize, if significant fluid collection consider thora with cytology - may benefit from palliative involvement  # Encephalopathy Likely 2/2 opioids and benzos, acute illness, hyponatremia. Son reports some recent facial drooping - MRI brain  # Acute hypoxic respiratory failure 80# on arrival now 98% on 3 liters - [place] O2, wean as able  # Hyponatremia 129 on arrival, 128 today. Has not improved with fluids. Cortisol wnl. Siadh from process most likely etiology - tsh - urine osm/sodium - serum osm  # Sacral insufficiency fractures # Chronic pain - will seek to balance pain control with over-sedation. Will continue xanax 0.125 tid prn, will hold oxycodone given patient's degree of sedation - PT/OT  # A-fib Rate controlled - hold apixaban for the time being    DVT prophylaxis: SCDs Code Status: DNR Family Communication: 3 children updated @ bedside 5/13  Level of care: Telemetry Medical Status is: Inpatient Remains inpatient appropriate because: severity of illness    Consultants:  none  Procedures: none  Antimicrobials:  See above    Subjective: Somnolent, chronic sacral pain  Objective: Vitals:   02/24/23 0715 02/24/23 0730 02/24/23 1102 02/24/23 1531  BP: (!) 144/88 (!) 156/69    Pulse: 73 87 88 79  Resp: 16 18 18 18   Temp:      TempSrc:      SpO2: 99% 100% 100% 98%  Weight:      Height:  Intake/Output Summary (Last 24 hours) at 02/24/2023 1721 Last data filed at 02/24/2023 0504 Gross per 24 hour  Intake 1350 ml  Output --  Net 1350 ml   Filed Weights   02/23/23 2220  Weight: 39.9 kg    Examination:  General exam: Appears calm , chronically ill appearing Respiratory system: decreased breath sounds at bases and rales at bases Cardiovascular system: S1 & S2 heard, soft systolic murmur. No pedal edema. Gastrointestinal system: Abdomen is  nondistended, soft and nontender.   Central nervous system: somnolent, confused, moving all 4 Extremities: decreased muscle tone Skin: No rashes, lesions or ulcers Psychiatry: calm, confused    Data Reviewed: I have personally reviewed following labs and imaging studies  CBC: Recent Labs  Lab 02/23/23 2221 02/23/23 2247 02/24/23 0004 02/24/23 0442  WBC 31.6*  --   --  34.6*  NEUTROABS 27.4*  --   --   --   HGB 12.3 13.6 13.6 11.5*  HCT 40.0 40.0 40.0 36.6  MCV 84.6  --   --  84.3  PLT 469*  --   --  464*   Basic Metabolic Panel: Recent Labs  Lab 02/23/23 2221 02/23/23 2247 02/24/23 0004 02/24/23 0442  NA 129* 128* 129* 128*  K 4.3 4.3 4.4 4.2  CL 91* 93*  --  90*  CO2 25  --   --  26  GLUCOSE 153* 151*  --  153*  BUN 20 24*  --  17  CREATININE 0.48 0.40*  --  0.47  CALCIUM 8.9  --   --  8.5*   GFR: Estimated Creatinine Clearance: 27.7 mL/min (by C-G formula based on SCr of 0.47 mg/dL). Liver Function Tests: Recent Labs  Lab 02/23/23 2221  AST 23  ALT 15  ALKPHOS 183*  BILITOT 0.6  PROT 6.5  ALBUMIN 2.4*   No results for input(s): "LIPASE", "AMYLASE" in the last 168 hours. No results for input(s): "AMMONIA" in the last 168 hours. Coagulation Profile: Recent Labs  Lab 02/23/23 2221 02/24/23 0442  INR 1.7* 1.7*   Cardiac Enzymes: No results for input(s): "CKTOTAL", "CKMB", "CKMBINDEX", "TROPONINI" in the last 168 hours. BNP (last 3 results) No results for input(s): "PROBNP" in the last 8760 hours. HbA1C: No results for input(s): "HGBA1C" in the last 72 hours. CBG: No results for input(s): "GLUCAP" in the last 168 hours. Lipid Profile: No results for input(s): "CHOL", "HDL", "LDLCALC", "TRIG", "CHOLHDL", "LDLDIRECT" in the last 72 hours. Thyroid Function Tests: No results for input(s): "TSH", "T4TOTAL", "FREET4", "T3FREE", "THYROIDAB" in the last 72 hours. Anemia Panel: No results for input(s): "VITAMINB12", "FOLATE", "FERRITIN", "TIBC", "IRON",  "RETICCTPCT" in the last 72 hours. Urine analysis:    Component Value Date/Time   COLORURINE YELLOW 02/05/2023 1559   APPEARANCEUR CLEAR 02/05/2023 1559   LABSPEC 1.020 02/05/2023 1559   PHURINE 6.0 02/05/2023 1559   GLUCOSEU NEGATIVE 02/05/2023 1559   HGBUR MODERATE (A) 02/05/2023 1559   BILIRUBINUR NEGATIVE 02/05/2023 1559   KETONESUR NEGATIVE 02/05/2023 1559   PROTEINUR NEGATIVE 02/05/2023 1559   UROBILINOGEN 0.2 01/25/2014 1657   NITRITE NEGATIVE 02/05/2023 1559   LEUKOCYTESUR TRACE (A) 02/05/2023 1559   Sepsis Labs: @LABRCNTIP (procalcitonin:4,lacticidven:4)  ) Recent Results (from the past 240 hour(s))  Resp panel by RT-PCR (RSV, Flu A&B, Covid) Anterior Nasal Swab     Status: None   Collection Time: 02/23/23 10:22 PM   Specimen: Anterior Nasal Swab  Result Value Ref Range Status   SARS Coronavirus 2 by RT PCR NEGATIVE  NEGATIVE Final   Influenza A by PCR NEGATIVE NEGATIVE Final   Influenza B by PCR NEGATIVE NEGATIVE Final    Comment: (NOTE) The Xpert Xpress SARS-CoV-2/FLU/RSV plus assay is intended as an aid in the diagnosis of influenza from Nasopharyngeal swab specimens and should not be used as a sole basis for treatment. Nasal washings and aspirates are unacceptable for Xpert Xpress SARS-CoV-2/FLU/RSV testing.  Fact Sheet for Patients: BloggerCourse.com  Fact Sheet for Healthcare Providers: SeriousBroker.it  This test is not yet approved or cleared by the [place] FDA and has been authorized for detection and/or diagnosis of SARS-CoV-2 by FDA under an Emergency Use Authorization (EUA). This EUA will remain in effect (meaning this test can be used) for the duration of the COVID-19 declaration under Section 564(b)(1) of the Act, 21 U.S.C. section 360bbb-3(b)(1), unless the authorization is terminated or revoked.     Resp Syncytial Virus by PCR NEGATIVE NEGATIVE Final    Comment: (NOTE) Fact Sheet for  Patients: BloggerCourse.com  Fact Sheet for Healthcare Providers: SeriousBroker.it  This test is not yet approved or cleared by the [place] FDA and has been authorized for detection and/or diagnosis of SARS-CoV-2 by FDA under an Emergency Use Authorization (EUA). This EUA will remain in effect (meaning this test can be used) for the duration of the COVID-19 declaration under Section 564(b)(1) of the Act, 21 U.S.C. section 360bbb-3(b)(1), unless the authorization is terminated or revoked.  Performed at [facility], [address] [place], [place] [address]   Blood culture (routine x 2)     Status: None (Preliminary result)   Collection Time: 02/23/23 11:30 PM   Specimen: BLOOD  Result Value Ref Range Status   Specimen Description BLOOD SITE NOT SPECIFIED  Final   Special Requests   Final    BOTTLES DRAWN AEROBIC AND ANAEROBIC Blood Culture results may not be optimal due to an excessive volume of blood received in culture bottles   Culture   Final    NO GROWTH < 12 HOURS Performed at [facility], [address] [place], [place] [address]    Report Status PENDING  Incomplete  Blood culture (routine x 2)     Status: None (Preliminary result)   Collection Time: 02/23/23 11:35 PM   Specimen: BLOOD LEFT FOREARM  Result Value Ref Range Status   Specimen Description BLOOD LEFT FOREARM  Final   Special Requests   Final    BOTTLES DRAWN AEROBIC AND ANAEROBIC Blood Culture results may not be optimal due to an excessive volume of blood received in culture bottles   Culture   Final    NO GROWTH < 12 HOURS Performed at [facility], [address] [place], [place] [address]    Report Status PENDING  Incomplete         Radiology Studies: DG Chest Port 1 View  Result Date: 02/23/2023 CLINICAL DATA:  Shortness of breath.  Fall recently. EXAM: PORTABLE CHEST 1 VIEW COMPARISON:  03/18/2022, 01/21/2023.  FINDINGS: The heart borders are obscured and mediastinal contours are within normal limits. There is atherosclerotic calcification of the aorta. Apical pleural scarring is noted bilaterally. There is redemonstration of a nodular density in the right upper lobe. There is patchy airspace disease in the mid to lower lung fields with small to moderate pleural effusions. No acute osseous abnormality. IMPRESSION: 1. Small to moderate bilateral pleural effusions with atelectasis, edema, or infiltrate at the lung bases. 2. Nodular density in the  right upper lobe, better evaluated on recent CT. Electronically Signed   By: Thornell Sartorius M.D.   On: 02/23/2023 23:03        Scheduled Meds:  apixaban  2.5 mg Oral BID   calcium carbonate  1 tablet Oral BID WC   guaiFENesin  600 mg Oral BID   ipratropium-albuterol  3 mL Nebulization QID   oxyCODONE  5 mg Oral TID   Continuous Infusions:  azithromycin Stopped (02/24/23 0504)   cefTRIAXone (ROCEPHIN)  IV     lactated ringers 150 mL/hr (02/24/23 1433)     LOS: 1 day     Silvano Bilis, MD [facility]   If 7PM-7AM, please contact night-coverage [email or web address] Password [facility] 02/24/2023, 5:21 PM

## 2023-02-25 ENCOUNTER — Encounter (HOSPITAL_COMMUNITY)
Admission: EM | Disposition: A | Discharge status: Home / Self Care | Payer: Self-pay | Source: Home / Self Care | Attending: Internal Medicine

## 2023-02-25 ENCOUNTER — Other Ambulatory Visit: Payer: Self-pay

## 2023-02-25 ENCOUNTER — Inpatient Hospital Stay (HOSPITAL_COMMUNITY): Payer: Medicare Other

## 2023-02-25 ENCOUNTER — Encounter (HOSPITAL_COMMUNITY): Payer: Self-pay | Admitting: Family Medicine

## 2023-02-25 DIAGNOSIS — A419 Sepsis, unspecified organism: Secondary | ICD-10-CM | POA: Diagnosis not present

## 2023-02-25 DIAGNOSIS — J90 Pleural effusion, not elsewhere classified: Secondary | ICD-10-CM

## 2023-02-25 DIAGNOSIS — J189 Pneumonia, unspecified organism: Secondary | ICD-10-CM | POA: Diagnosis not present

## 2023-02-25 DIAGNOSIS — J918 Pleural effusion in other conditions classified elsewhere: Secondary | ICD-10-CM

## 2023-02-25 DIAGNOSIS — R918 Other nonspecific abnormal finding of lung field: Secondary | ICD-10-CM

## 2023-02-25 HISTORY — PX: THORACENTESIS: SHX235

## 2023-02-25 LAB — BASIC METABOLIC PANEL
Anion gap: 8 (ref 5–15)
BUN: 20 mg/dL (ref 8–23)
CO2: 28 mmol/L (ref 22–32)
Calcium: 8.9 mg/dL (ref 8.9–10.3)
Chloride: 97 mmol/L — ABNORMAL LOW (ref 98–111)
Creatinine, Ser: 0.43 mg/dL — ABNORMAL LOW (ref 0.44–1.00)
GFR, Estimated: >60 mL/min (ref >60)
Glucose, Bld: 117 mg/dL — ABNORMAL HIGH (ref 70–99)
Potassium: 4.5 mmol/L (ref 3.5–5.1)
Sodium: 133 mmol/L — ABNORMAL LOW (ref 135–145)

## 2023-02-25 LAB — LACTATE DEHYDROGENASE, PLEURAL OR PERITONEAL FLUID: LD, Fluid: 48 U/L — ABNORMAL HIGH (ref 3–23)

## 2023-02-25 LAB — BODY FLUID CELL COUNT WITH DIFFERENTIAL
Eos, Fluid: 0 %
Lymphs, Fluid: 11 %
Monocyte-Macrophage-Serous Fluid: 17 % — ABNORMAL LOW (ref 50–90)
Neutrophil Count, Fluid: 72 % — ABNORMAL HIGH (ref 0–25)
Total Nucleated Cell Count, Fluid: 2559 cu mm — ABNORMAL HIGH (ref 0–1000)

## 2023-02-25 LAB — CULTURE, BLOOD (ROUTINE X 2)

## 2023-02-25 LAB — LEGIONELLA PNEUMOPHILA SEROGP 1 UR AG: L. pneumophila Serogp 1 Ur Ag: NEGATIVE

## 2023-02-25 LAB — PROTEIN, PLEURAL OR PERITONEAL FLUID: Total protein, fluid: <3 g/dL

## 2023-02-25 SURGERY — THORACENTESIS
Anesthesia: LOCAL

## 2023-02-25 MED ORDER — LIDOCAINE HCL (PF) 1 % IJ SOLN
INTRAMUSCULAR | Status: AC
  Filled 2023-02-25: qty 30

## 2023-02-25 MED ORDER — APIXABAN 2.5 MG PO TABS
2.5000 mg | ORAL_TABLET | Freq: Two times a day (BID) | ORAL | Status: DC
  Administered 2023-02-26 – 2023-03-03 (×11): 2.5 mg via ORAL
  Filled 2023-02-25 (×11): qty 1

## 2023-02-25 MED ORDER — FENTANYL CITRATE PF 50 MCG/ML IJ SOSY
12.5000 ug | PREFILLED_SYRINGE | Freq: Once | INTRAMUSCULAR | Status: AC
  Administered 2023-02-25: 12.5 ug via INTRAVENOUS

## 2023-02-25 MED ORDER — OXYCODONE HCL 5 MG PO TABS
2.5000 mg | ORAL_TABLET | Freq: Once | ORAL | Status: AC
  Administered 2023-02-26: 2.5 mg via ORAL
  Filled 2023-02-25: qty 1

## 2023-02-25 MED ORDER — HYDROCOD POLI-CHLORPHE POLI ER 10-8 MG/5ML PO SUER: 5.0000 mL | Freq: Every evening | ORAL | Status: DC | PRN

## 2023-02-25 MED ORDER — LIDOCAINE HCL (PF) 1 % IJ SOLN
5.0000 mL | Freq: Once | INTRAMUSCULAR | Status: DC
  Filled 2023-02-25: qty 5

## 2023-02-25 MED ORDER — FENTANYL CITRATE (PF) 100 MCG/2ML IJ SOLN
INTRAMUSCULAR | Status: AC
  Filled 2023-02-25: qty 2

## 2023-02-25 MED ORDER — HEPARIN SODIUM (PORCINE) 5000 UNIT/ML IJ SOLN
5000.0000 [IU] | Freq: Two times a day (BID) | INTRAMUSCULAR | Status: DC
  Administered 2023-02-25: 5000 [IU] via SUBCUTANEOUS
  Filled 2023-02-25: qty 1

## 2023-02-25 MED ORDER — METOPROLOL SUCCINATE ER 25 MG PO TB24
12.5000 mg | ORAL_TABLET | Freq: Every day | ORAL | Status: DC
  Filled 2023-02-25: qty 1

## 2023-02-25 MED ORDER — IPRATROPIUM-ALBUTEROL 0.5-2.5 (3) MG/3ML IN SOLN
3.0000 mL | RESPIRATORY_TRACT | Status: DC | PRN
  Administered 2023-02-27 – 2023-03-01 (×2): 3 mL via RESPIRATORY_TRACT
  Filled 2023-02-25 (×2): qty 3

## 2023-02-25 MED ORDER — LIDOCAINE 5 % EX PTCH
1.0000 | MEDICATED_PATCH | Freq: Once | CUTANEOUS | Status: AC
  Administered 2023-02-25: 1 via TRANSDERMAL
  Filled 2023-02-25: qty 1

## 2023-02-25 MED ORDER — OXYCODONE HCL 5 MG PO TABS
2.5000 mg | ORAL_TABLET | Freq: Once | ORAL | Status: AC | PRN
  Administered 2023-02-25: 2.5 mg via ORAL
  Filled 2023-02-25: qty 1
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
## 2023-02-25 NOTE — Progress Notes (Signed)
Pt & family reporting severe chronic pain in back not relieved with Robaxin/Tylenol/ repositioning. Per family, pt was on Oxy 5 and was stopped abruptly due to SOB/ pneumonia. Pt has been somewhat restless as well. Tried xanax and robaxin again with little relief. Pts family requesting oxycodone and lidocaine patch. Provider paged, please see new orders.

## 2023-02-25 NOTE — Progress Notes (Signed)
PT Cancellation Note  Patient Details Name: Charyl Tomassetti MRN: [id number] DOB: 05-06-1927   Cancelled Treatment:    Reason Eval/Treat Not Completed: Patient at procedure or test/unavailable  Will follow up later today as time allows;  Otherwise, will follow up for PT tomorrow;   Thank you,  Van Clines, PT  Acute Rehabilitation Services Office [phone]    Levi Aland 02/25/2023, 2:09 PM

## 2023-02-25 NOTE — Progress Notes (Signed)
Pt takes meds crushed in apple sauce. Family states pt has some challenges swallowing thin liquids due to history of esophageal spasms. Pt only had a few small sips of water throughout the night but tolerated tomato soup well. Pt needs frequent redirection to swallow mouth contents.

## 2023-02-25 NOTE — Telephone Encounter (Signed)
Contacted Kaitlin [name] via telephone. Currently hospitalized. Discussed care with primary and pulmonary team. Encouraged Kaitlin [name] to discuss plan and recommendations with inpatient team.

## 2023-02-25 NOTE — Evaluation (Signed)
Occupational Therapy Evaluation Patient Details Name: Kaitlin [name] MRN: [id number] DOB: 18-Feb-1927 Today's Date: 02/25/2023   History of Present Illness Pt is a 89 y/o female who presents to the ED with SOB, history of coccyx fx on 01/21/23. PMH significant for anxiety, a-fib, GIB.   Clinical Impression   Pt is s/p above diagnosis. Pt visited by two sons and daughter during visit discussing plan of care with mother. Pt anxious about discussions children were having about her POC. Pt was living at home with 24/7 care, recently moved in with son who assists 24/7, has all DME at home necessary, Pt would benefit from [facility] but Pt and son may refuse as son believes he is able to attend to all needs until mother is able to improve function and return home. Pt displays some confusion when asked to perform tasks, requires some assistance for ADLs and safety awareness, will be seen acutely during stay.      Recommendations for follow up therapy are one component of a multi-disciplinary discharge planning process, led by the attending physician.  Recommendations may be updated based on patient status, additional functional criteria and insurance authorization.   Assistance Recommended at Discharge Frequent or constant Supervision/Assistance  Patient can return home with the following A little help with walking and/or transfers;A lot of help with bathing/dressing/bathroom;Assistance with cooking/housework;Assist for transportation;Help with stairs or ramp for entrance    Functional Status Assessment  Patient has had a recent decline in their functional status and demonstrates the ability to make significant improvements in function in a reasonable and predictable amount of time.  Equipment Recommendations  None recommended by OT    Recommendations for Other Services       Precautions / Restrictions Precautions Precautions: Fall Restrictions Weight Bearing Restrictions: No      Mobility  Bed Mobility Overal bed mobility: Needs Assistance Bed Mobility: Supine to Sit     Supine to sit: Mod assist, HOB elevated     General bed mobility comments: assistance for sitting upright and for BLEs off bed    Transfers Overall transfer level: Needs assistance Equipment used: Rolling walker (2 wheels) Transfers: Sit to/from Stand, Bed to chair/wheelchair/BSC Sit to Stand: Mod assist     Step pivot transfers: Min guard     General transfer comment: assist with STS, once up is min guard for ambulation      Balance Overall balance assessment: Needs assistance Sitting-balance support: Feet supported Sitting balance-Leahy Scale: Fair     Standing balance support: Bilateral upper extremity supported, During functional activity, Reliant on assistive device for balance Standing balance-Leahy Scale: Fair Standing balance comment: requires RW for support                           ADL either performed or assessed with clinical judgement   ADL Overall ADL's : Needs assistance/impaired Eating/Feeding: Set up   Grooming: Wash/dry hands;Wash/dry face;Oral care;Brushing hair;Minimal assistance;Sitting   Upper Body Bathing: Minimal assistance;Sitting   Lower Body Bathing: Moderate assistance;Cueing for safety;Sitting/lateral leans   Upper Body Dressing : Minimal assistance;Cueing for sequencing   Lower Body Dressing: Moderate assistance;Cueing for safety;Sit to/from stand   Toilet Transfer: Moderate assistance;Cueing for safety;Regular Toilet;Rolling walker (2 wheels)   Toileting- Clothing Manipulation and Hygiene: Moderate assistance;Cueing for safety;Sit to/from stand       Functional mobility during ADLs: Min guard;Cueing for safety;Rolling walker (2 wheels) General ADL Comments: Pt displays decreased safety awareness and sequencing  with tasks, difficulty problem solving when asked to perform tasks     Vision Baseline Vision/History: 1 Wears  glasses Ability to See in Adequate Light: 0 Adequate Patient Visual Report: No change from baseline       Perception     Praxis      Pertinent Vitals/Pain Pain Assessment Pain Assessment: No/denies pain     Hand Dominance Right   Extremity/Trunk Assessment Upper Extremity Assessment Upper Extremity Assessment: Generalized weakness;RUE deficits/detail;LUE deficits/detail RUE Deficits / Details: decreased shoulder ROM, difficulty with reaching/overhead tasks, UB ADLs LUE Deficits / Details: decreased shoulder ROM, difficulty with reaching/overhead tasks, UB ADLs   Lower Extremity Assessment Lower Extremity Assessment: Defer to PT evaluation       Communication Communication Communication: HOH   Cognition Arousal/Alertness: Awake/alert Behavior During Therapy: WFL for tasks assessed/performed Overall Cognitive Status: Impaired/Different from baseline Area of Impairment: Following commands, Safety/judgement, Awareness, Problem solving                       Following Commands: Follows one step commands inconsistently Safety/Judgement: Decreased awareness of safety   Problem Solving: Slow processing, Decreased initiation, Requires verbal cues General Comments: Pt had difficulty with following instructions, some confusion with asking to perform ADLs or mobility, distracted with two sons and nursing in room, anxious about her sons making discussing DC plan     General Comments       Exercises     Shoulder Instructions      Home Living Family/patient expects to be discharged to:: Private residence Living Arrangements: Children Available Help at Discharge: Family;Available 24 hours/day Type of Home: House                                  Prior Functioning/Environment Prior Level of Function : Needs assist       Physical Assist : Mobility (physical);ADLs (physical) Mobility (physical): Transfers;Stairs ADLs (physical):  Bathing;Dressing;IADLs Mobility Comments: ind a few months ago, uses rollator since coccyx fx on 01/21/23, now presents with weakness and decreased balance ADLs Comments: asssist with bathing, dressing, supervision-min A, help from son        OT Problem List: Decreased strength;Decreased range of motion;Decreased activity tolerance;Impaired balance (sitting and/or standing);Decreased safety awareness;Impaired UE functional use      OT Treatment/Interventions: Self-care/ADL training;Therapeutic exercise;Energy conservation;DME and/or AE instruction;Therapeutic activities;Patient/family education    OT Goals(Current goals can be found in the care plan section) Acute Rehab OT Goals Patient Stated Goal: to return home with son and improve functional strength OT Goal Formulation: With patient/family Time For Goal Achievement: 03/11/23 Potential to Achieve Goals: Good  OT Frequency: Min 2X/week    Co-evaluation              AM-PAC OT "6 Clicks" Daily Activity     Outcome Measure Help from another person eating meals?: A Little Help from another person taking care of personal grooming?: A Little Help from another person toileting, which includes using toliet, bedpan, or urinal?: A Lot Help from another person bathing (including washing, rinsing, drying)?: A Lot Help from another person to put on and taking off regular upper body clothing?: A Little Help from another person to put on and taking off regular lower body clothing?: A Lot 6 Click Score: 15   End of Session Equipment Utilized During Treatment: Gait belt;Rolling walker (2 wheels) Nurse Communication: Mobility status  Activity Tolerance: Patient tolerated  treatment well Patient left: in chair;with call bell/phone within reach;with chair alarm set;with nursing/sitter in room;with family/visitor present  OT Visit Diagnosis: Unsteadiness on feet (R26.81);Repeated falls (R29.6);History of falling (Z91.81);Other symptoms and signs  involving cognitive function;Adult, failure to thrive (R62.7)                Time: 1610-9604 OT Time Calculation (min): 47 min Charges:  OT General Charges $OT Visit: 1 Visit OT Evaluation $OT Eval Moderate Complexity: 1 Mod OT Treatments $Self Care/Home Management : 23-37 mins  [address], OTR/L   Alexis Goodell 02/25/2023, 11:55 AM

## 2023-02-25 NOTE — Progress Notes (Signed)
Per family, please no visitors outside of the three people listed in patients contacts.   Kaitlin [name], Kaitlin [name] and Kaitlin [name] are the only visitors the family wants for the patient.

## 2023-02-25 NOTE — Progress Notes (Signed)
Met with Mike Craze  There seems to be a lot of family dynamics-Mike is a IT sales professional and strongly believes that patient should undergo a trial of resuscitation with intubation if she were to get "ill"-I explained that that would be part barbaric if we were to do this and not do chest compressions which she has claimed in the past that she would not do We explored this in more detail I explained this to Mellody Dance and Lawson Fiscal as well--Keith is a Teacher, early years/pre by trade and understands that cardiorespiratory resuscitation goes hand-in-hand not just intubation without current ACLS guidelines of compressions and airway then breathing  Malachi Bonds shares that her perception is that her brother Sherald Barge is the patient's daughter from the first marriage and Kathlene November and Mellody Dance are children from the second marriage] is trying to direct care She shares her concerns about this and mentions that she takes the patient to all of her appointments and does everything for her  I have no way of corroborating any of this information, I have simply requested that the family come together and ensure that we are serving the patient's best interest-I think that anything but DNR at her frail age of 14 with her BMI of 15 would not be in the patient's best interests  I referenced Dr. Sundra Aland eye pal discussion from 09/2022 with all of the family and they concur that this is the way forward DNR CODE STATUS stands I have alerted the unit director as well to some of the dynamics going on  > 85 minutes

## 2023-02-25 NOTE — Progress Notes (Signed)
Patient sons refused Metoprolol which was ordered for elevated BP. Patient son who is a pharmacist said that diastolic is not treatable until its over 90.I explained that patient systolic was 172, he was wanted the med to be held and monitor BP.  Provider made aware

## 2023-02-25 NOTE — Progress Notes (Signed)
PROGRESS NOTE   Kaitlin [name]  ZOX:[id number] DOB: March 24, 1927 DOA: 02/23/2023 PCP: Thana Ates, MD  Brief Narrative:  89 year old white female home dwelling moderately functional NSTEMI 01/29/2014 but normal coronaries, mitral valve prolapse + tachybradycardia/atrial fib CHA2DS2-VASc 2 > 6 Previous acute small lacunar infarcts 03/08/2017-apixaban Anxiety HTN Chronic low back pain Esophageal dysmotility status postdilatation previously BMI 15 MGUS CT chest 09/25/2022 = RUL solid density 16 X19-(previously 7X 8 mm)-follows with Dr. Everardo All of pulmonary last seen 12/05/2022 and surveillance was warranted at that time as family elected for this mainly  Recent admission 4/24-4/27 after a fall 01/21/2023 with sacral fracture-managed nonoperatively and discharged home  Represent [facility] ED 5/12 SOB thick mucus requiring nonrebreather with EMS given sats 80% Rx DuoNeb Solu-Medrol magnesium WBC 31 sodium 129 BNP 327 platelet 469 INR 1.7, portable CXR = small-moderate bilateral effusions atelectasis and nodular densities RUL Rx cefepime and bank bolus fluids Xanax  5/14 pulmonary consulted regarding effusions now found on CTA that look loculated to my review-thoracentesis 650 mL Dr. Chestine Spore   Hospital-Problem based course  Multifocal pneumonia with pleural effusions R >L side, severe is criteria not septic on admission Thoracentesis as above-continues azithromycin ceftriaxone Cut back saline 100-->50 cc/H Follow thoracentesis cultures 5/14, blood cultures 5/12 Continue Tussionex nightly, Mucinex twice daily and as needed albuterol  Tachybradycardia syndrome A-fib CHADVASC >6 Resume Eliquis a.m.-?  Not on rate/rhythm control?  Prior to admission Keep on monitors for now Adding Toprol-XL 12.5 today given elevated pressures in the 170s-watch heart rate carefully Can use labetalol 20 every 3 as needed blood pressure >160  Toxic encephalopathy on admission MR brain 5/13 no intracranial  findings suggestive of stroke  Lacunar infarct 02/2017 On apixaban which will be resumed 5/15  Chronic esophageal dysmotility with multiple dilatations Placed on dysphagia 2 diet-observe carefully and seated at 45 degrees while eating  Previous NSTEMI 01/2014 with normal coronaries Supportive management at this time  [facility] Outpatient follow-up   RUL nodule Previously seen by Dr. Everardo All of pulmonology in the outpatient setting-family at the time was interested in active surveillance and discussion about?  XRT down the line Will would defer this discussion to pulmonologist as an outpatient-will CC her with regards to the same  BMI 15 Adult failure to thrive At baseline the patient is able to vocalize her needs but has had some decline since the last 6 months She has failure to thrive is 89 years old and I reiterated my concerns about aggressive resuscitation to the family She remains DNR after extensive discussion  DVT prophylaxis: Heparin for now, resume Eliquis a.m. 5/15-order placed Code Status: DNR Family Communication: Discussed with all 3 family members Disposition:  Status is: Inpatient Remains inpatient appropriate because:   Patient is sick and has multifocal pneumonia Not approved for discharge for at least 48 hours    Subjective: Coherent soft-spoken can voice her concerns but quite hard of hearing When I saw her in the morning she had significant shortness of breath however in postprocedure at around 3:30 PM she seemed a little better although sleepy with the sedation that had been given for thoracentesis No chest pain currently  Objective: Vitals:   02/24/23 1538 02/24/23 2020 02/24/23 2300 02/24/23 2309  BP: 128/69 (!) 148/81  (!) 132/52  Pulse: 72 83  64  Resp: 16     Temp: (!) 97.4 F (36.3 C)  (!) 97.4 F (36.3 C)   TempSrc: Oral     SpO2: 100% 100%  99%  Weight:      Height:        Intake/Output Summary (Last 24 hours) at 02/25/2023 0757 Last data  filed at 02/25/2023 0300 Gross per 24 hour  Intake 859.75 ml  Output --  Net 859.75 ml   Filed Weights   02/23/23 2220  Weight: 39.9 kg    Examination:  Cachectic ill-appearing white female no distress no icterus no pallor Bitemporal wasting supraclavicular wasting Neck soft supple Decreased air entry posterolaterally bilaterally cannot appreciate rales no egophony Abdomen is soft No lower extremity edema Abdomen scaphoid Neuro intact grossly to power rest deferred  Data Reviewed: personally reviewed   CBC    Component Value Date/Time   WBC 34.6 (H) 02/24/2023 0442   RBC 4.34 02/24/2023 0442   HGB 11.5 (L) 02/24/2023 0442   HGB 12.4 12/13/2022 1451   HGB 15.4 07/28/2017 1417   HCT 36.6 02/24/2023 0442   HCT 39.5 12/13/2022 1451   HCT 46.8 (H) 07/28/2017 1417   PLT 464 (H) 02/24/2023 0442   PLT 205 12/13/2022 1451   MCV 84.3 02/24/2023 0442   MCV 81 12/13/2022 1451   MCV 92.3 07/28/2017 1417   MCH 26.5 02/24/2023 0442   MCHC 31.4 02/24/2023 0442   RDW 16.5 (H) 02/24/2023 0442   RDW 13.6 12/13/2022 1451   RDW 14.7 (H) 07/28/2017 1417   LYMPHSABS 1.2 02/23/2023 2221   LYMPHSABS 1.3 01/08/2018 1236   LYMPHSABS 1.4 07/28/2017 1417   MONOABS 1.6 (H) 02/23/2023 2221   MONOABS 0.5 07/28/2017 1417   EOSABS 0.0 02/23/2023 2221   EOSABS 0.1 01/08/2018 1236   BASOSABS 0.1 02/23/2023 2221   BASOSABS 0.0 01/08/2018 1236   BASOSABS 0.0 07/28/2017 1417      Latest Ref Rng & Units 02/24/2023    4:42 AM 02/24/2023   12:04 AM 02/23/2023   10:47 PM  CMP  Glucose 70 - 99 mg/dL 161   096   BUN 8 - 23 mg/dL 17   24   Creatinine 0.45 - 1.00 mg/dL 4.09   8.11   Sodium 914 - 145 mmol/L 128  129  128   Potassium 3.5 - 5.1 mmol/L 4.2  4.4  4.3   Chloride 98 - 111 mmol/L 90   93   CO2 22 - 32 mmol/L 26     Calcium 8.9 - 10.3 mg/dL 8.5        Radiology Studies: MR BRAIN WO CONTRAST  Result Date: 02/24/2023 CLINICAL DATA:  Encephalopathy, facial droop. EXAM: MRI HEAD WITHOUT  CONTRAST TECHNIQUE: Multiplanar, multiecho pulse sequences of the brain and surrounding structures were obtained without intravenous contrast. COMPARISON:  MRI brain 03/07/2017.  Head CT 01/21/2023. FINDINGS: Brain: No acute infarct or hemorrhage. Stable mild chronic small-vessel disease. Volume is within expected range for age. No hydrocephalus or extra-axial collection. No mass or midline shift. No foci of abnormal susceptibility. Vascular: Normal flow voids. Skull and upper cervical spine: Normal marrow signal. Sinuses/Orbits: Unremarkable. Other: None. IMPRESSION: No acute intracranial process. Electronically Signed   By: Orvan Falconer M.D.   On: 02/24/2023 20:35   CT Angio Chest Pulmonary Embolism (PE) W or WO Contrast  Result Date: 02/24/2023 CLINICAL DATA:  Lung mass, cough. EXAM: CT ANGIOGRAPHY CHEST WITH CONTRAST TECHNIQUE: Multidetector CT imaging of the chest was performed using the standard protocol during bolus administration of intravenous contrast. Multiplanar CT image reconstructions and MIPs were obtained to evaluate the vascular anatomy. RADIATION DOSE REDUCTION: This exam was performed according to the departmental  dose-optimization program which includes automated exposure control, adjustment of the mA and/or kV according to patient size and/or use of iterative reconstruction technique. CONTRAST:  75mL OMNIPAQUE IOHEXOL 350 MG/ML SOLN COMPARISON:  Chest x-ray 02/23/2023.  CT of the chest 01/21/2023. FINDINGS: Cardiovascular: Heart is enlarged. There is no pericardial effusion. Aorta is normal in size. There are atherosclerotic calcifications of the aorta. There is adequate opacification of the pulmonary arteries to the segmental level. There is no evidence for pulmonary embolism. Mediastinum/Nodes: There are enlarged right hilar lymph nodes measuring up to 11 mm short axis. No other enlarged mediastinal lymph nodes are seen. Visualized esophagus and thyroid gland are within normal limits.  Lungs/Pleura: There are new small to moderate-sized bilateral pleural effusions, right greater than left. Biapical pleural-parenchymal scarring appears stable. Bilobed nodular density in the right upper lobe with nodular components measuring 6 mm and minimal surrounding ground-glass opacity appears unchanged from prior. There is new ill-defined nodular and tree-in-bud opacities in the bilateral inferior lower lobes, right middle lobe and inferior upper lobes bilaterally. There is additional airspace consolidation with air bronchograms in the right middle lobe, lingula and right lower lobe. There is compressive atelectasis of the left lower lobe. Trachea and central airways are patent. There is no pneumothorax. Upper Abdomen: No acute abnormality. Musculoskeletal: No chest wall abnormality. Mild compression deformities of the superior endplates of T3, T4 and T5 have slightly progressed. Review of the MIP images confirms the above findings. IMPRESSION: 1. No evidence for pulmonary embolism. 2. New small to moderate-sized bilateral pleural effusions, right greater than left. 3. New ill-defined nodular and tree-in-bud opacities in the inferior upper lobes, right middle lobe and right lower lobe worrisome for multifocal pneumonia. 4. New airspace consolidation in the right middle lobe, right lower lobe and lingula. 5. Stable bilobed nodular density in the right upper lobe measuring 6 mm. 6. Stable cardiomegaly. 7. Right hilar lymphadenopathy. 8. Progression of mild compression deformities of the superior endplates of T3, T4 and T5. Aortic Atherosclerosis (ICD10-I70.0). Electronically Signed   By: Darliss Cheney M.D.   On: 02/24/2023 19:26   DG Chest Port 1 View  Result Date: 02/23/2023 CLINICAL DATA:  Shortness of breath.  Fall recently. EXAM: PORTABLE CHEST 1 VIEW COMPARISON:  03/18/2022, 01/21/2023. FINDINGS: The heart borders are obscured and mediastinal contours are within normal limits. There is atherosclerotic  calcification of the aorta. Apical pleural scarring is noted bilaterally. There is redemonstration of a nodular density in the right upper lobe. There is patchy airspace disease in the mid to lower lung fields with small to moderate pleural effusions. No acute osseous abnormality. IMPRESSION: 1. Small to moderate bilateral pleural effusions with atelectasis, edema, or infiltrate at the lung bases. 2. Nodular density in the right upper lobe, better evaluated on recent CT. Electronically Signed   By: Thornell Sartorius M.D.   On: 02/23/2023 23:03     Scheduled Meds:  [START ON 02/26/2023] apixaban  2.5 mg Oral BID   calcium carbonate  1 tablet Oral BID WC   guaiFENesin  600 mg Oral BID   heparin injection (subcutaneous)  5,000 Units Subcutaneous Q12H   lidocaine (PF)  5 mL Intradermal Once   metoprolol succinate  12.5 mg Oral Daily   Continuous Infusions:  sodium chloride 100 mL/hr at 02/25/23 0518   azithromycin 500 mg (02/25/23 0214)   cefTRIAXone (ROCEPHIN)  IV 2 g (02/24/23 2306)     LOS: 2 days   Time spent: 45 minutes  Jai-Gurmukh Lennard Capek,  MD [facility] To contact the attending provider between 7A-7P or the covering provider during after hours 7P-7A, please log into the web site [email or web address] and access using universal [place] password for that web site. If you do not have the password, please call the hospital operator.  02/25/2023, 7:57 AM

## 2023-02-25 NOTE — Procedures (Signed)
Thoracentesis  Procedure Note  Kaitlin [name]  [id number]  05-Sep-1927  Date:02/25/23  Time:2:31 PM   Provider Performing:[place] [name]   Procedure: Thoracentesis with imaging guidance ([id number])  Indication(s) Pleural Effusion  Consent Risks of the procedure as well as the alternatives and risks of each were explained to the patient and/or caregiver.  Consent for the procedure was obtained and is signed in the bedside chart  Anesthesia Topical only with 1% lidocaine    Time Out Verified patient identification, verified procedure, site/side was marked, verified correct patient position, special equipment/implants available, medications/allergies/relevant history reviewed, required imaging and test results available.   Sterile Technique Maximal sterile technique including full sterile barrier drape, hand hygiene, sterile gown, sterile gloves, mask, hair covering, sterile ultrasound probe cover (if used).  Procedure Description Ultrasound was used to identify appropriate pleural anatomy for placement and overlying skin marked.  Area of drainage cleaned and draped in sterile fashion. Lidocaine was used to anesthetize the skin and subcutaneous tissue.  650 cc's of amber, cloudy appearing fluid was drained from the right pleural space. Catheter then removed and bandaid applied to site.   Complications/Tolerance None; patient tolerated the procedure well. Chest X-ray is ordered to confirm no post-procedural complication.   EBL Minimal   Specimen(s) Pleural fluid for cytology, culture & sensitivities, cell count with diff, protein, LDH  Steffanie Dunn, DO 02/25/23 2:34 PM [facility]  For contact information, see Amion. If no response to pager, please call PCCM consult pager. After hours, 7PM- 7AM, please call Elink.

## 2023-02-26 DIAGNOSIS — J9601 Acute respiratory failure with hypoxia: Secondary | ICD-10-CM | POA: Diagnosis not present

## 2023-02-26 DIAGNOSIS — J90 Pleural effusion, not elsewhere classified: Secondary | ICD-10-CM

## 2023-02-26 DIAGNOSIS — R918 Other nonspecific abnormal finding of lung field: Secondary | ICD-10-CM

## 2023-02-26 DIAGNOSIS — I4819 Other persistent atrial fibrillation: Secondary | ICD-10-CM | POA: Diagnosis not present

## 2023-02-26 DIAGNOSIS — E871 Hypo-osmolality and hyponatremia: Secondary | ICD-10-CM | POA: Diagnosis not present

## 2023-02-26 DIAGNOSIS — J189 Pneumonia, unspecified organism: Secondary | ICD-10-CM | POA: Diagnosis not present

## 2023-02-26 DIAGNOSIS — F419 Anxiety disorder, unspecified: Secondary | ICD-10-CM

## 2023-02-26 LAB — BASIC METABOLIC PANEL
Anion gap: 8 (ref 5–15)
BUN: 17 mg/dL (ref 8–23)
CO2: 29 mmol/L (ref 22–32)
Calcium: 8.7 mg/dL — ABNORMAL LOW (ref 8.9–10.3)
Chloride: 98 mmol/L (ref 98–111)
Creatinine, Ser: 0.43 mg/dL — ABNORMAL LOW (ref 0.44–1.00)
GFR, Estimated: >60 mL/min (ref >60)
Glucose, Bld: 106 mg/dL — ABNORMAL HIGH (ref 70–99)
Potassium: 4.1 mmol/L (ref 3.5–5.1)
Sodium: 135 mmol/L (ref 135–145)

## 2023-02-26 LAB — CBC WITH DIFFERENTIAL/PLATELET
Abs Immature Granulocytes: 1.18 10*3/uL — ABNORMAL HIGH (ref 0.00–0.07)
Basophils Absolute: 0.1 10*3/uL (ref 0.0–0.1)
Basophils Relative: 0 %
Eosinophils Absolute: 0 10*3/uL (ref 0.0–0.5)
Eosinophils Relative: 0 %
HCT: 36.3 % (ref 36.0–46.0)
Hemoglobin: 11 g/dL — ABNORMAL LOW (ref 12.0–15.0)
Immature Granulocytes: 5 %
Lymphocytes Relative: 4 %
Lymphs Abs: 0.9 10*3/uL (ref 0.7–4.0)
MCH: 25.5 pg — ABNORMAL LOW (ref 26.0–34.0)
MCHC: 30.3 g/dL (ref 30.0–36.0)
MCV: 84.2 fL (ref 80.0–100.0)
Monocytes Absolute: 1.1 10*3/uL — ABNORMAL HIGH (ref 0.1–1.0)
Monocytes Relative: 5 %
Neutro Abs: 19.7 10*3/uL — ABNORMAL HIGH (ref 1.7–7.7)
Neutrophils Relative %: 86 %
Platelets: 348 10*3/uL (ref 150–400)
RBC: 4.31 MIL/uL (ref 3.87–5.11)
RDW: 16.5 % — ABNORMAL HIGH (ref 11.5–15.5)
WBC: 23 10*3/uL — ABNORMAL HIGH (ref 4.0–10.5)
nRBC: 0 % (ref 0.0–0.2)

## 2023-02-26 LAB — CULTURE, BLOOD (ROUTINE X 2): Culture: NO GROWTH

## 2023-02-26 LAB — BODY FLUID CULTURE W GRAM STAIN: Culture: NO GROWTH

## 2023-02-26 MED ORDER — OXYCODONE-ACETAMINOPHEN 5-325 MG PO TABS
1.0000 | ORAL_TABLET | Freq: Four times a day (QID) | ORAL | Status: DC | PRN
  Administered 2023-02-26: 1 via ORAL
  Filled 2023-02-26 (×2): qty 1

## 2023-02-26 MED ORDER — OXYCODONE-ACETAMINOPHEN 5-325 MG PO TABS
1.0000 | ORAL_TABLET | ORAL | Status: DC | PRN
  Administered 2023-02-26 – 2023-03-02 (×18): 1 via ORAL
  Administered 2023-03-03: 2 via ORAL
  Administered 2023-03-03: 1 via ORAL
  Filled 2023-02-26 (×3): qty 1
  Filled 2023-02-26: qty 2
  Filled 2023-02-26 (×2): qty 1
  Filled 2023-02-26: qty 2
  Filled 2023-02-26 (×10): qty 1
  Filled 2023-02-26: qty 2
  Filled 2023-02-26 (×5): qty 1
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
## 2023-02-26 NOTE — Progress Notes (Signed)
PROGRESS NOTE    Kaitlin [name]  ZOX:[id number] DOB: November 07, 1926 DOA: 02/23/2023 PCP: Thana Ates, MD    Brief Narrative:  Patient is a 89 year old white female patient with past medical history of anxiety and depression, atrial fibrillation, CVA, GERD, peripheral neuropathy, tachybradycardia syndrome and history of non-ST elevation MI 2015 with normal coronaries, Esophageal dysmotility status postdilatation previously, MGUS was admitted hospital on 02/24/2023 with complaints of shortness of breath, productive cough.  Patient was noted to be hypoxic with a pulse ox of 80% on room air and was given DuoNeb and Solu-Medrol and was brought into the hospital.  In the ED she needed to 100% nonrebreather mask.  Of note patient did have a recent fall 4 weeks ago with coccygeal fracture and since then she had been having failure to thrive and decreased mobility with poor oral intake.  In the ED patient had elevated blood pressure and was subsequently on 4 L of oxygen by nasal cannula.  Initial labs showed hyponatremia with a sodium of 129 and BNP 327.  CBC showed significant leukocytosis at 31.6 with neutrophilia, and INR was 1.7.  EKG showed atrial fibrillation with controlled ventricular response.  Chest x-ray showed a small to moderate bilateral pleural effusion with RUL solid density 16 X19-(previously 7X 8 mm)-follows with Dr. Everardo All of pulmonary last seen 12/05/2022 and surveillance was warranted at that time as family elected for this mainly.  During hospitalization, pulmonary was consulted for possibility of loculated effusion and thoracocentesis was done with removal of 650 mL of fluid.  Patient was continued on azithromycin and Rocephin.   Assessment and plan.  Multifocal pneumonia with bilateral parapneumonic effusions. Status post thoracocentesis.  Pleural fluid culture with no organisms so far.  Blood cultures negative in 3 days.  Continue Tussionex Mucinex as needed albuterol.  Pulmonary  following.  Procalcitonin low.  Follow cytology.  Pulmonary hygiene impression of extreme frailty and further intervention might not improve her quality of life so at this time have recommended comfort care.   Tachybradycardia syndrome/ A-fib  CHADVASC >6 patient was started on Toprol-XL 12.5 mg due to elevated blood pressure.  Has been started on Eliquis.   Toxic encephalopathy on admission.  MRI of the brain without any acute findings.  History of lacunar infarct 2018.  Will resume Eliquis if okay with pulmonary.   Chronic esophageal dysmotility with multiple dilatations Continue dysphagia 2 diet, put on 45 degrees while eating   Previous NSTEMI 01/2014 with normal coronaries Continue supportive care.  Has been started on metoprolol.   MGUS Will need outpatient follow-up    RUL mass.  Enlarging. Previously seen by Dr. Everardo All of pulmonology in the outpatient setting.  Family at that time was interested in active surveillance.  Further plan as per pulmonary.  Recent increase in size noted.   Failure to thrive/debility, deconditioning. Decline since the last 6 months.  Patient appears to be extremely cachectic, debilitated and frail.  Prognosis seems to be very guarded and poor at this time.  CODE STATUS.  DNR.   DVT prophylaxis: apixaban (ELIQUIS) tablet 2.5 mg Start: 02/26/23 1000 Place and maintain sequential compression device Start: 02/24/23 1734 apixaban (ELIQUIS) tablet 2.5 mg   Code Status:     Code Status: DNR  Disposition: Uncertain at this time  Status is: Inpatient  Remains inpatient appropriate because: Pending clinical improvement, frailty, debility, IV antibiotics,   Family Communication:  Spoke with the patient's daughter at bedside. I also spoke with the son and  updated him of the clinical condition of the patient.  Spoke about palliative care involvement for goals of care and symptom management and better understanding of symptomatology/expectations and family  coordination discussion.  Consultants:  PCCM Pulmonary Palliative care  Procedures:  None  Antimicrobials:  Rocephin and Zithromax 5/12>  Subjective: Today, patient was seen and examined at bedside.  Patient's family at bedside.  Multiple symptoms including cough, phlegm production, shortness of breath, decreased appetite.  Patient's daughter states that patient might be in pain and wishes for resuming pain medications.  Objective: Vitals:   02/25/23 1627 02/25/23 2023 02/26/23 0437 02/26/23 0752  BP: (!) 172/77 131/62 138/82 (!) 161/77  Pulse: 79 82 66 100  Resp: 20 17 18 15   Temp: (!) 97.5 F (36.4 C) 98.1 F (36.7 C)  (!) 97.5 F (36.4 C)  TempSrc: Oral Oral  Oral  SpO2: 96% 96% 99% 99%  Weight:      Height:        Intake/Output Summary (Last 24 hours) at 02/26/2023 1350 Last data filed at 02/26/2023 0300 Gross per 24 hour  Intake 1821.29 ml  Output --  Net 1821.29 ml   Filed Weights   02/23/23 2220  Weight: 39.9 kg    Physical Examination: Body mass index is 15.58 kg/m.  General: Thinly built, in mild distress, intermittently somnolent, frail, cachectic, debilitated and weak.  On nasal cannula oxygen HENT:   No scleral pallor or icterus noted. Oral mucosa is moist.  Chest:    Diminished breath sounds bilaterally.  Coarse breath sounds noted. CVS: S1 &S2 heard. No murmur.  Regular rate and rhythm. Abdomen: Soft, nontender, nondistended.  Bowel sounds are heard.   Extremities: No cyanosis, clubbing or edema.  Peripheral pulses are palpable. Psych: Alert, awake and Communicative, weak and frail. CNS:  No cranial nerve deficits.  Generalized weakness noted. Skin: Warm and dry.  No rashes noted.  Data Reviewed:   CBC: Recent Labs  Lab 02/23/23 2221 02/23/23 2247 02/24/23 0004 02/24/23 0442 02/26/23 0440  WBC 31.6*  --   --  34.6* 23.0*  NEUTROABS 27.4*  --   --   --  19.7*  HGB 12.3 13.6 13.6 11.5* 11.0*  HCT 40.0 40.0 40.0 36.6 36.3  MCV 84.6  --    --  84.3 84.2  PLT 469*  --   --  464* 348    Basic Metabolic Panel: Recent Labs  Lab 02/23/23 2221 02/23/23 2247 02/24/23 0004 02/24/23 0442 02/25/23 0928 02/26/23 0440  NA 129* 128* 129* 128* 133* 135  K 4.3 4.3 4.4 4.2 4.5 4.1  CL 91* 93*  --  90* 97* 98  CO2 25  --   --  26 28 29   GLUCOSE 153* 151*  --  153* 117* 106*  BUN 20 24*  --  17 20 17   CREATININE 0.48 0.40*  --  0.47 0.43* 0.43*  CALCIUM 8.9  --   --  8.5* 8.9 8.7*    Liver Function Tests: Recent Labs  Lab 02/23/23 2221  AST 23  ALT 15  ALKPHOS 183*  BILITOT 0.6  PROT 6.5  ALBUMIN 2.4*     Radiology Studies: DG CHEST PORT 1 VIEW  Result Date: 02/25/2023 CLINICAL DATA:  Post thoracentesis EXAM: PORTABLE CHEST 1 VIEW COMPARISON:  CT 02/24/2023 radiograph 02/23/2023 1 FINDINGS: Mild apical thickening at the RIGHT apex unchanged from comparison studies. No RIGHT pneumothorax identified. Reduction in RIGHT pleural effusion. Persistent bilateral pleural effusions noted. Bibasilar atelectasis  and basilar airspace disease again noted. IMPRESSION: No appreciable pneumothorax following thoracentesis. Persistent bilateral effusions and bibasilar airspace disease. Electronically Signed   By: Genevive Bi M.D.   On: 02/25/2023 15:03   MR BRAIN WO CONTRAST  Result Date: 02/24/2023 CLINICAL DATA:  Encephalopathy, facial droop. EXAM: MRI HEAD WITHOUT CONTRAST TECHNIQUE: Multiplanar, multiecho pulse sequences of the brain and surrounding structures were obtained without intravenous contrast. COMPARISON:  MRI brain 03/07/2017.  Head CT 01/21/2023. FINDINGS: Brain: No acute infarct or hemorrhage. Stable mild chronic small-vessel disease. Volume is within expected range for age. No hydrocephalus or extra-axial collection. No mass or midline shift. No foci of abnormal susceptibility. Vascular: Normal flow voids. Skull and upper cervical spine: Normal marrow signal. Sinuses/Orbits: Unremarkable. Other: None. IMPRESSION: No acute  intracranial process. Electronically Signed   By: Orvan Falconer M.D.   On: 02/24/2023 20:35   CT Angio Chest Pulmonary Embolism (PE) W or WO Contrast  Result Date: 02/24/2023 CLINICAL DATA:  Lung mass, cough. EXAM: CT ANGIOGRAPHY CHEST WITH CONTRAST TECHNIQUE: Multidetector CT imaging of the chest was performed using the standard protocol during bolus administration of intravenous contrast. Multiplanar CT image reconstructions and MIPs were obtained to evaluate the vascular anatomy. RADIATION DOSE REDUCTION: This exam was performed according to the departmental dose-optimization program which includes automated exposure control, adjustment of the mA and/or kV according to patient size and/or use of iterative reconstruction technique. CONTRAST:  75mL OMNIPAQUE IOHEXOL 350 MG/ML SOLN COMPARISON:  Chest x-ray 02/23/2023.  CT of the chest 01/21/2023. FINDINGS: Cardiovascular: Heart is enlarged. There is no pericardial effusion. Aorta is normal in size. There are atherosclerotic calcifications of the aorta. There is adequate opacification of the pulmonary arteries to the segmental level. There is no evidence for pulmonary embolism. Mediastinum/Nodes: There are enlarged right hilar lymph nodes measuring up to 11 mm short axis. No other enlarged mediastinal lymph nodes are seen. Visualized esophagus and thyroid gland are within normal limits. Lungs/Pleura: There are new small to moderate-sized bilateral pleural effusions, right greater than left. Biapical pleural-parenchymal scarring appears stable. Bilobed nodular density in the right upper lobe with nodular components measuring 6 mm and minimal surrounding ground-glass opacity appears unchanged from prior. There is new ill-defined nodular and tree-in-bud opacities in the bilateral inferior lower lobes, right middle lobe and inferior upper lobes bilaterally. There is additional airspace consolidation with air bronchograms in the right middle lobe, lingula and right  lower lobe. There is compressive atelectasis of the left lower lobe. Trachea and central airways are patent. There is no pneumothorax. Upper Abdomen: No acute abnormality. Musculoskeletal: No chest wall abnormality. Mild compression deformities of the superior endplates of T3, T4 and T5 have slightly progressed. Review of the MIP images confirms the above findings. IMPRESSION: 1. No evidence for pulmonary embolism. 2. New small to moderate-sized bilateral pleural effusions, right greater than left. 3. New ill-defined nodular and tree-in-bud opacities in the inferior upper lobes, right middle lobe and right lower lobe worrisome for multifocal pneumonia. 4. New airspace consolidation in the right middle lobe, right lower lobe and lingula. 5. Stable bilobed nodular density in the right upper lobe measuring 6 mm. 6. Stable cardiomegaly. 7. Right hilar lymphadenopathy. 8. Progression of mild compression deformities of the superior endplates of T3, T4 and T5. Aortic Atherosclerosis (ICD10-I70.0). Electronically Signed   By: Darliss Cheney M.D.   On: 02/24/2023 19:26      LOS: 3 days    Joycelyn Das, MD [facility] Available via Epic secure chat  7am-7pm After these hours, please refer to coverage provider listed on [email or web address] 02/26/2023, 1:50 PM

## 2023-02-26 NOTE — Plan of Care (Signed)
  Problem: Pain Managment: Goal: General experience of comfort will improve Outcome: Progressing   

## 2023-02-26 NOTE — Evaluation (Signed)
Physical Therapy Evaluation Patient Details Name: Kaitlin [name] MRN: [id number] DOB: August 13, 1927 Today's Date: 02/26/2023  History of Present Illness  89 yo female with onset of SOB and productive cough was admitted 5/12 for acute respiratory failure, hypoxia, and received meds for pulm function.  Has 69 week old coccyx fracture. PMHx:  a-fib, anxiety, depression, GERD, diverticulosis, MI, coronary spasm, atherosclerosis, IBS, PN, tachybrady syndrome, HTN, coccyx fracture, GI bleed  Clinical Impression  Pt was seen for initiation of standing at bedside, with daughter in attendance to provide information and support to get pt up with encouragement.  Pt is motivated to try but very weak and slept poorly last PM apparently.  Will recommend her to be considered for PT <3 hours a day with inpt care, and to consider her needs thereafter longer term.  Family has not committed to venue but are considering her final results when all testing information is in.  Follow acutely for goals of PT as are outlined below.       Recommendations for follow up therapy are one component of a multi-disciplinary discharge planning process, led by the attending physician.  Recommendations may be updated based on patient status, additional functional criteria and insurance authorization.  Follow Up Recommendations Can patient physically be transported by private vehicle: No     Assistance Recommended at Discharge Frequent or constant Supervision/Assistance  Patient can return home with the following  Two people to help with walking and/or transfers;A lot of help with bathing/dressing/bathroom;Assistance with cooking/housework;Direct supervision/assist for medications management;Direct supervision/assist for financial management;Assist for transportation;Help with stairs or ramp for entrance    Equipment Recommendations None recommended by PT  Recommendations for Other Services       Functional Status Assessment  Patient has had a recent decline in their functional status and demonstrates the ability to make significant improvements in function in a reasonable and predictable amount of time.     Precautions / Restrictions Precautions Precautions: Fall Restrictions Weight Bearing Restrictions: No      Mobility  Bed Mobility Overal bed mobility: Needs Assistance Bed Mobility: Supine to Sit, Sit to Supine     Supine to sit: Max assist Sit to supine: Total assist   General bed mobility comments: tired out from standing practice, unable to assist return to bed    Transfers Overall transfer level: Needs assistance Equipment used: Rolling walker (2 wheels) Transfers: Sit to/from Stand Sit to Stand: Mod assist           General transfer comment: cannot walk once standing as legs give out too quickly    Ambulation/Gait               General Gait Details: unable to step  Stairs            Wheelchair Mobility    Modified Rankin (Stroke Patients Only)       Balance Overall balance assessment: Needs assistance Sitting-balance support: Feet supported Sitting balance-Leahy Scale: Poor     Standing balance support: Bilateral upper extremity supported, During functional activity, Reliant on assistive device for balance Standing balance-Leahy Scale: Poor                               Pertinent Vitals/Pain Pain Assessment Pain Assessment: 0-10 Pain Score: 10-Worst pain ever Pain Location: sacral area and back Pain Descriptors / Indicators: Grimacing, Guarding, Spasm Pain Intervention(s): Limited activity within patient's tolerance, Monitored during session, Premedicated before  session, Repositioned    Home Living Family/patient expects to be discharged to:: Unsure Living Arrangements: Children Available Help at Discharge: Family;Available 24 hours/day Type of Home: House Home Access: Stairs to enter Entrance Stairs-Rails: Conservation officer, historic buildings of Steps: 5   Home Layout: One level Home Equipment: Hand held shower head;Educational psychologist (4 wheels) Additional Comments: was recently living home alone, but now family is awaiting testing/results to determine her dc location    Prior Function Prior Level of Function : Needs assist       Physical Assist : Mobility (physical) Mobility (physical): Transfers;Gait;Bed mobility   Mobility Comments: has declined to rollator and uses as a wheelchair at home since coccyx fracture       Hand Dominance   Dominant Hand: Right    Extremity/Trunk Assessment   Upper Extremity Assessment Upper Extremity Assessment: Defer to OT evaluation RUE Deficits / Details: poor overhead reach and ability to generate force to assist mobility    Lower Extremity Assessment Lower Extremity Assessment: Generalized weakness    Cervical / Trunk Assessment Cervical / Trunk Assessment: Kyphotic  Communication   Communication: HOH  Cognition Arousal/Alertness: Awake/alert Behavior During Therapy: WFL for tasks assessed/performed Overall Cognitive Status: Difficult to assess Area of Impairment: Following commands, Attention, Problem solving                   Current Attention Level: Selective   Following Commands: Follows one step commands with increased time Safety/Judgement: Decreased awareness of safety, Decreased awareness of deficits   Problem Solving: Slow processing General Comments: pt is up to side of bed with fatigue and needed a lot of cues and help to come to stand up.  Pt is distracted by pain and fatigue        General Comments General comments (skin integrity, edema, etc.): pt is generally weak and required pillow on R side to support sitting with no direct help.    Exercises     Assessment/Plan    PT Assessment Patient needs continued PT services  PT Problem List Decreased strength;Decreased activity tolerance;Decreased balance;Decreased  mobility;Decreased coordination;Decreased safety awareness;Decreased skin integrity;Pain       PT Treatment Interventions DME instruction;Gait training;Functional mobility training;Therapeutic activities;Therapeutic exercise;Balance training;Neuromuscular re-education;Patient/family education    PT Goals (Current goals can be found in the Care Plan section)  Acute Rehab PT Goals Patient Stated Goal: none stated PT Goal Formulation: With family Time For Goal Achievement:  Potential to Achieve Goals: Fair    Frequency Min 2X/week     Co-evaluation               AM-PAC PT "6 Clicks" Mobility  Outcome Measure Help needed turning from your back to your side while in a flat bed without using bedrails?: A Lot Help needed moving from lying on your back to sitting on the side of a flat bed without using bedrails?: A Lot Help needed moving to and from a bed to a chair (including a wheelchair)?: A Lot Help needed standing up from a chair using your arms (e.g., wheelchair or bedside chair)?: A Lot Help needed to walk in hospital room?: Total Help needed climbing 3-5 steps with a railing? : Total 6 Click Score: 10    End of Session Equipment Utilized During Treatment: Gait belt Activity Tolerance: Patient limited by fatigue Patient left: in chair;with call bell/phone within reach;with family/visitor present Nurse Communication: Mobility status PT Visit Diagnosis: Unsteadiness on feet (R26.81);Pain Pain - Right/Left:  (back and sacrum)  Pain - part of body:  (low back sacrum)    Time: 1610-9604 PT Time Calculation (min) (ACUTE ONLY): 25 min   Charges:   PT Evaluation $PT Eval Moderate Complexity: 1 Mod PT Treatments $Therapeutic Activity: 8-22 mins       Ivar Drape 02/26/2023, 2:16 PM  Samul Dada, PT PhD Acute Rehab Dept. Number: [facility] [id number] and [facility] [phone]

## 2023-02-26 NOTE — Care Management Important Message (Signed)
Important Message  Patient Details  Name: Kassidi Decasas MRN: [id number] Date of Birth: [birth year]   Medicare Important Message Given:  Yes     Sherilyn Banker 02/26/2023, 11:56 AM

## 2023-02-27 ENCOUNTER — Encounter (HOSPITAL_COMMUNITY): Payer: Self-pay | Admitting: Critical Care Medicine

## 2023-02-27 DIAGNOSIS — E871 Hypo-osmolality and hyponatremia: Secondary | ICD-10-CM | POA: Diagnosis not present

## 2023-02-27 DIAGNOSIS — J189 Pneumonia, unspecified organism: Secondary | ICD-10-CM | POA: Diagnosis not present

## 2023-02-27 DIAGNOSIS — J9601 Acute respiratory failure with hypoxia: Secondary | ICD-10-CM | POA: Diagnosis not present

## 2023-02-27 DIAGNOSIS — I4819 Other persistent atrial fibrillation: Secondary | ICD-10-CM | POA: Diagnosis not present

## 2023-02-27 LAB — CBC
HCT: 38.4 % (ref 36.0–46.0)
Hemoglobin: 11.9 g/dL — ABNORMAL LOW (ref 12.0–15.0)
MCH: 26.3 pg (ref 26.0–34.0)
MCHC: 31 g/dL (ref 30.0–36.0)
MCV: 85 fL (ref 80.0–100.0)
Platelets: 312 10*3/uL (ref 150–400)
RBC: 4.52 MIL/uL (ref 3.87–5.11)
RDW: 16.6 % — ABNORMAL HIGH (ref 11.5–15.5)
WBC: 22.6 10*3/uL — ABNORMAL HIGH (ref 4.0–10.5)
nRBC: 0 % (ref 0.0–0.2)

## 2023-02-27 LAB — BASIC METABOLIC PANEL
Anion gap: 10 (ref 5–15)
BUN: 12 mg/dL (ref 8–23)
CO2: 29 mmol/L (ref 22–32)
Calcium: 8.5 mg/dL — ABNORMAL LOW (ref 8.9–10.3)
Chloride: 95 mmol/L — ABNORMAL LOW (ref 98–111)
Creatinine, Ser: 0.48 mg/dL (ref 0.44–1.00)
GFR, Estimated: >60 mL/min (ref >60)
Glucose, Bld: 91 mg/dL (ref 70–99)
Potassium: 4.4 mmol/L (ref 3.5–5.1)
Sodium: 134 mmol/L — ABNORMAL LOW (ref 135–145)

## 2023-02-27 LAB — CYTOLOGY - NON PAP

## 2023-02-27 LAB — BODY FLUID CULTURE W GRAM STAIN

## 2023-02-27 LAB — MAGNESIUM: Magnesium: 1.8 mg/dL (ref 1.7–2.4)

## 2023-02-27 MED ORDER — NYSTATIN 100000 UNIT/ML MT SUSP
5.0000 mL | Freq: Four times a day (QID) | OROMUCOSAL | Status: DC
  Administered 2023-02-27 – 2023-03-03 (×16): 500000 [IU] via ORAL
  Filled 2023-02-27 (×15): qty 5

## 2023-02-27 NOTE — Progress Notes (Signed)
PROGRESS NOTE    Kaitlin [name]  ZOX:[id number] DOB: April 12, 1927 DOA: 02/23/2023 PCP: Thana Ates, MD    Brief Narrative:  Patient is a 89 year old white female patient with past medical history of anxiety and depression, atrial fibrillation, CVA, GERD, peripheral neuropathy, tachybradycardia syndrome and history of non-ST elevation MI 2015 with normal coronaries, Esophageal dysmotility status postdilatation previously, MGUS was admitted hospital on 02/24/2023 with complaints of shortness of breath, productive cough.  Patient was noted to be hypoxic with a pulse ox of 80% on room air and was given DuoNeb and Solu-Medrol and was brought into the hospital.  In the ED she needed to 100% nonrebreather mask.  Of note patient did have a recent fall 4 weeks ago with coccygeal fracture and since then she had been having failure to thrive and decreased mobility with poor oral intake.  In the ED patient had elevated blood pressure and was subsequently on 4 L of oxygen by nasal cannula.  Initial labs showed hyponatremia with a sodium of 129 and BNP 327.  CBC showed significant leukocytosis at 31.6 with neutrophilia, and INR was 1.7.  EKG showed atrial fibrillation with controlled ventricular response.  Chest x-ray showed a small to moderate bilateral pleural effusion with RUL solid density 16 X19-(previously 7X 8 mm)-follows with Dr. Everardo All of pulmonary last seen 12/05/2022 and surveillance was warranted at that time as family elected for this mainly.  During hospitalization, pulmonary was consulted for possibility of loculated effusion and thoracocentesis was done with removal of 650 mL of fluid.  Patient was continued on azithromycin and Rocephin.   Assessment and plan.  Multifocal pneumonia with bilateral parapneumonic effusions. Status post thoracocentesis.  Pleural fluid culture with no organisms so far.  Blood cultures negative in 4 days.  Continue  Mucinex as needed albuterol.  Pulmonary following.   Procalcitonin low.  Follow pleural fluid cytology.  Continue pulmonary hygiene, patient has been requiring suctioning due to productive sputum.  Due to extreme frailty  further intervention might not improve her quality of life so currently recommending no further invasive intervention and possibility of comfort care.  On IV Rocephin and Zithromax.  Will continue for now.  Currently on 2 L of oxygen by nasal cannula.  He is afebrile.  Still has significant leukocytosis at 22.6 from 23 K yesterday.  Tachybradycardia syndrome/ A-fib  CHADVASC >6 patient was started on Toprol-XL 12.5 mg due to elevated blood pressure.  Has been started on Eliquis.   Toxic encephalopathy on admission.  MRI of the brain without any acute findings.  History of lacunar infarct 2018.  Has been started on Eliquis.   Chronic esophageal dysmotility with multiple dilatations Continue dysphagia 2 diet, put on 45 degrees while eating.  Has been having a lot of secretions and suctioning.   Previous NSTEMI 01/2014 with normal coronaries Continue metoprolol   MGUS Will need outpatient follow-up    RUL mass.  Enlarging. Previously seen by Dr. Everardo All of pulmonology in the outpatient setting.  Family at that time was interested in active surveillance. Recent increase in size noted.  Pulmonary following.   Failure to thrive/debility, deconditioning. Decline since the last 6 months.  Patient appears to be extremely cachectic, debilitated and frail.  Prognosis seems to be very guarded at this time.  Have spoken with the family members at different times and will get palliative care consultation.  CODE STATUS.  DNR.   DVT prophylaxis: apixaban (ELIQUIS) tablet 2.5 mg Start: 02/26/23 1000 Place and maintain  sequential compression device Start: 02/24/23 1734 apixaban (ELIQUIS) tablet 2.5 mg   Code Status:     Code Status: DNR  Disposition: Uncertain at this time  Status is: Inpatient  Remains inpatient appropriate because:  Pending clinical improvement, frailty, debility, IV antibiotics,   Family Communication:  Spoke with the patient's daughter at bedside. I also spoke with the son on the phone yesterday.  I again spoke about palliative care consultation to define further goals of care.  Consultants:  PCCM Pulmonary Palliative care  Procedures:  Thoracocentesis.  Antimicrobials:  Rocephin and Zithromax 5/12>  Subjective: Today, patient was seen and examined at bedside.  Multiple family members at bedside complains of cough shortness of breath and phlegm production with decreased appetite.  Patient did have quite a bit of pain yesterday and after adjusting Percocet she seems to be little more comfortable.    Objective: Vitals:   02/26/23 0752 02/26/23 1547 02/27/23 0538 02/27/23 0813  BP: (!) 161/77 (!) 156/71 (!) 175/89 (!) 151/75  Pulse: 100 82 73 84  Resp: 15 18 17 16   Temp: (!) 97.5 F (36.4 C) (!) 97.5 F (36.4 C) 97.6 F (36.4 C) 98.2 F (36.8 C)  TempSrc: Oral Oral  Oral  SpO2: 99% 99% 98% 97%  Weight:      Height:       No intake or output data in the 24 hours ending 02/27/23 1042  Filed Weights   02/23/23 2220  Weight: 39.9 kg    Physical Examination: Body mass index is 15.58 kg/m.   General: Awake, feeble voice, in mild distress, appears frail cachectic, On nasal cannula oxygen HENT:   No scleral pallor or icterus noted. Oral mucosa is moist.  Chest:    Diminished breath sounds bilaterally.   CVS: S1 &S2 heard. No murmur.  Regular rate and rhythm. Abdomen: Soft, nontender, nondistended.  Bowel sounds are heard.   Extremities: No cyanosis, clubbing or edema.  Peripheral pulses are palpable. Psych: Alert, awake and Communicative, weak and frail. CNS:  No cranial nerve deficits.  Generalized weakness noted. Skin: Warm and dry.  No rashes noted.  Data Reviewed:   CBC: Recent Labs  Lab 02/23/23 2221 02/23/23 2247 02/24/23 0004 02/24/23 0442 02/26/23 0440  02/27/23 0111  WBC 31.6*  --   --  34.6* 23.0* 22.6*  NEUTROABS 27.4*  --   --   --  19.7*  --   HGB 12.3 13.6 13.6 11.5* 11.0* 11.9*  HCT 40.0 40.0 40.0 36.6 36.3 38.4  MCV 84.6  --   --  84.3 84.2 85.0  PLT 469*  --   --  464* 348 312     Basic Metabolic Panel: Recent Labs  Lab 02/23/23 2221 02/23/23 2247 02/24/23 0004 02/24/23 0442 02/25/23 0928 02/26/23 0440 02/27/23 0111  NA 129* 128* 129* 128* 133* 135 134*  K 4.3 4.3 4.4 4.2 4.5 4.1 4.4  CL 91* 93*  --  90* 97* 98 95*  CO2 25  --   --  26 28 29 29   GLUCOSE 153* 151*  --  153* 117* 106* 91  BUN 20 24*  --  17 20 17 12   CREATININE 0.48 0.40*  --  0.47 0.43* 0.43* 0.48  CALCIUM 8.9  --   --  8.5* 8.9 8.7* 8.5*  MG  --   --   --   --   --   --  1.8     Liver Function Tests: Recent Labs  Lab  02/23/23 2221  AST 23  ALT 15  ALKPHOS 183*  BILITOT 0.6  PROT 6.5  ALBUMIN 2.4*      Radiology Studies: DG CHEST PORT 1 VIEW  Result Date: 02/25/2023 CLINICAL DATA:  Post thoracentesis EXAM: PORTABLE CHEST 1 VIEW COMPARISON:  CT 02/24/2023 radiograph 02/23/2023 1 FINDINGS: Mild apical thickening at the RIGHT apex unchanged from comparison studies. No RIGHT pneumothorax identified. Reduction in RIGHT pleural effusion. Persistent bilateral pleural effusions noted. Bibasilar atelectasis and basilar airspace disease again noted. IMPRESSION: No appreciable pneumothorax following thoracentesis. Persistent bilateral effusions and bibasilar airspace disease. Electronically Signed   By: Genevive Bi M.D.   On: 02/25/2023 15:03      LOS: 4 days    Joycelyn Das, MD [facility] Available via Epic secure chat 7am-7pm After these hours, please refer to coverage provider listed on [email or web address] 02/27/2023, 10:42 AM

## 2023-02-27 NOTE — Social Work (Signed)
  Transition of Care [facility]) Screening Note   Patient Details  Name: Kaitlin [name] Date of Birth: [birth year]   Transition of Care [facility]) CM/SW Contact:    Carley Hammed, LCSW Phone Number: 02/27/2023, 2:36 PM    Transition of Care Department [facility]) has reviewed patient and no TOC needs have been identified at this time. We will continue to monitor patient advancement through interdisciplinary progression rounds. If new patient transition needs arise, please place a TOC consult.

## 2023-02-27 NOTE — Progress Notes (Signed)
Occupational Therapy Treatment Patient Details Name: Kaitlin [name] MRN: [id number] DOB: 1927/10/10 Today's Date: 02/27/2023   History of present illness 89 yo female with onset of SOB and productive cough was admitted 5/12 for acute respiratory failure, hypoxia, and received meds for pulm function.  Has 80 week old coccyx fracture. PMHx:  a-fib, anxiety, depression, GERD, diverticulosis, MI, coronary spasm, atherosclerosis, IBS, PN, tachybrady syndrome, HTN, coccyx fracture, GI bleed   OT comments  Pt with good progress toward established OT goals. Moving about room with RW with min guard A and cues for hand placement and safety. Pt performing transfers with min guard And toileting with up to mod A. One LOB requiring mod A to correct. Due to pt requiring assist, updating discharge recommendation. Patient will benefit from continued inpatient follow up therapy, <3 hours/day. If family able to provide assist, pt may go home.    Recommendations for follow up therapy are one component of a multi-disciplinary discharge planning process, led by the attending physician.  Recommendations may be updated based on patient status, additional functional criteria and insurance authorization.    Assistance Recommended at Discharge Frequent or constant Supervision/Assistance  Patient can return home with the following  A little help with walking and/or transfers;A lot of help with bathing/dressing/bathroom;Assistance with cooking/housework;Assist for transportation;Help with stairs or ramp for entrance   Equipment Recommendations  None recommended by OT    Recommendations for Other Services      Precautions / Restrictions Precautions Precautions: Fall Restrictions Weight Bearing Restrictions: No       Mobility Bed Mobility               General bed mobility comments: In recliner on arrival and departure    Transfers Overall transfer level: Needs assistance Equipment used: Rolling  walker (2 wheels) Transfers: Sit to/from Stand Sit to Stand: Min guard           General transfer comment: cues for safety.     Balance Overall balance assessment: Needs assistance Sitting-balance support: Feet supported Sitting balance-Leahy Scale: Fair     Standing balance support: Bilateral upper extremity supported, During functional activity, Reliant on assistive device for balance Standing balance-Leahy Scale: Poor Standing balance comment: requires RW for support                           ADL either performed or assessed with clinical judgement   ADL Overall ADL's : Needs assistance/impaired     Grooming: Wash/dry hands;Min guard;Standing Grooming Details (indicate cue type and reason): at sink             Lower Body Dressing: Moderate assistance;Sit to/from stand Lower Body Dressing Details (indicate cue type and reason): to manage underpants standing by [facility] Toilet Transfer: Min guard;Rolling walker (2 wheels) Toilet Transfer Details (indicate cue type and reason): Min guard up to BSC ~7 ft Toileting- Clothing Manipulation and Hygiene: Total assistance Toileting - Clothing Manipulation Details (indicate cue type and reason): pt requesting to be wiped due to gown beng in way     Functional mobility during ADLs: Min guard;Cueing for safety;Rolling walker (2 wheels) General ADL Comments: One LOB requiring external support after standing from [facility]. Pt reports she got dizzy but BP consistent with other BPs. Cues to hold onto walker with BUE when near surfaces    Extremity/Trunk Assessment              Vision  Perception     Praxis      Cognition Arousal/Alertness: Awake/alert Behavior During Therapy: WFL for tasks assessed/performed Overall Cognitive Status: Difficult to assess Area of Impairment: Following commands, Attention, Problem solving                   Current Attention Level: Selective   Following Commands:  Follows one step commands with increased time     Problem Solving: Slow processing General Comments: intermittent cues. Some decreased attention to task and easily distracted        Exercises      Shoulder Instructions       General Comments Grand daughter present and supportive    Pertinent Vitals/ Pain       Pain Assessment Pain Assessment: Faces Faces Pain Scale: Hurts little more Pain Location: sacral area and back Pain Descriptors / Indicators: Grimacing, Guarding, Spasm Pain Intervention(s): Limited activity within patient's tolerance, Monitored during session  Home Living                                          Prior Functioning/Environment              Frequency  Min 2X/week        Progress Toward Goals  OT Goals(current goals can now be found in the care plan section)  Progress towards OT goals: Progressing toward goals  Acute Rehab OT Goals Time For Goal Achievement: 03/11/23 Potential to Achieve Goals: Good ADL Goals Pt Will Perform Lower Body Bathing: with min guard assist;sitting/lateral leans Pt Will Perform Lower Body Dressing: with set-up;sit to/from stand Pt Will Transfer to Toilet: with supervision;ambulating Pt Will Perform Toileting - Clothing Manipulation and hygiene: with supervision;sit to/from stand Pt/caregiver will Perform Home Exercise Program: Increased strength;Both right and left upper extremity;With theraband;With written HEP provided  Plan Discharge plan remains appropriate;Frequency remains appropriate    Co-evaluation                 AM-PAC OT "6 Clicks" Daily Activity     Outcome Measure   Help from another person eating meals?: A Little Help from another person taking care of personal grooming?: A Little Help from another person toileting, which includes using toliet, bedpan, or urinal?: A Lot Help from another person bathing (including washing, rinsing, drying)?: A Lot Help from another  person to put on and taking off regular upper body clothing?: A Little Help from another person to put on and taking off regular lower body clothing?: A Lot 6 Click Score: 15    End of Session Equipment Utilized During Treatment: Gait belt;Rolling walker (2 wheels)  OT Visit Diagnosis: Unsteadiness on feet (R26.81);Repeated falls (R29.6);History of falling (Z91.81);Other symptoms and signs involving cognitive function;Adult, failure to thrive (R62.7)   Activity Tolerance Patient tolerated treatment well   Patient Left in chair;with call bell/phone within reach;with chair alarm set;with family/visitor present   Nurse Communication Mobility status        Time: 1610-9604 OT Time Calculation (min): 32 min  Charges: OT General Charges $OT Visit: 1 Visit OT Treatments $Self Care/Home Management : 23-37 mins  Tyler Deis, OTR/L [facility] Acute Rehabilitation Office: [phone]   Myrla Halsted 02/27/2023, 7:38 PM

## 2023-02-28 DIAGNOSIS — I4819 Other persistent atrial fibrillation: Secondary | ICD-10-CM | POA: Diagnosis not present

## 2023-02-28 DIAGNOSIS — E871 Hypo-osmolality and hyponatremia: Secondary | ICD-10-CM | POA: Diagnosis not present

## 2023-02-28 DIAGNOSIS — J189 Pneumonia, unspecified organism: Secondary | ICD-10-CM | POA: Diagnosis not present

## 2023-02-28 DIAGNOSIS — J9601 Acute respiratory failure with hypoxia: Secondary | ICD-10-CM | POA: Diagnosis not present

## 2023-02-28 LAB — CBC
HCT: 36.9 % (ref 36.0–46.0)
Hemoglobin: 11.2 g/dL — ABNORMAL LOW (ref 12.0–15.0)
MCH: 26.5 pg (ref 26.0–34.0)
MCHC: 30.4 g/dL (ref 30.0–36.0)
MCV: 87.2 fL (ref 80.0–100.0)
Platelets: 299 10*3/uL (ref 150–400)
RBC: 4.23 MIL/uL (ref 3.87–5.11)
RDW: 16.8 % — ABNORMAL HIGH (ref 11.5–15.5)
WBC: 17.9 10*3/uL — ABNORMAL HIGH (ref 4.0–10.5)
nRBC: 0 % (ref 0.0–0.2)

## 2023-02-28 LAB — CULTURE, BLOOD (ROUTINE X 2): Culture: NO GROWTH

## 2023-02-28 LAB — BASIC METABOLIC PANEL
Anion gap: 9 (ref 5–15)
BUN: 10 mg/dL (ref 8–23)
CO2: 30 mmol/L (ref 22–32)
Calcium: 8.5 mg/dL — ABNORMAL LOW (ref 8.9–10.3)
Chloride: 97 mmol/L — ABNORMAL LOW (ref 98–111)
Creatinine, Ser: 0.42 mg/dL — ABNORMAL LOW (ref 0.44–1.00)
GFR, Estimated: >60 mL/min (ref >60)
Glucose, Bld: 87 mg/dL (ref 70–99)
Potassium: 4.3 mmol/L (ref 3.5–5.1)
Sodium: 136 mmol/L (ref 135–145)

## 2023-02-28 LAB — MAGNESIUM: Magnesium: 1.8 mg/dL (ref 1.7–2.4)

## 2023-02-28 NOTE — Progress Notes (Signed)
   02/28/23 1245  Spiritual Encounters  Type of Visit Initial  Care provided to: Pt and family  Conversation partners present during encounter Nurse  Referral source Family  Reason for visit Advance directives  OnCall Visit No  Spiritual Framework  Presenting Themes Other (comment);Meaning/purpose/sources of inspiration  Community/Connection Family  Patient Stress Factors Family relationships  Family Stress Factors Family relationships  Interventions  Spiritual Care Interventions Made Reflective listening;Decision-making support/facilitation;Prayer  Intervention Outcomes  Outcomes Other (comment)   Responded to request for advance directive, completed with son Kathlene November. However once completed son began making demands as to mother's care. Son wants to restrict visitors (his siblings and other family members) as to where only those he select can visit.  Was advised his siblings let in tears on yesterday (and fearful of Kathlene November). Tried to explain to son that advance directive does not allow him to make any decisions unless patient is unable to and that he must follow her wishes. The advance directive was completed with Kathlene November and his wife as the only ones to make decisions. Appears there may be some issues with family dynamics. Son insist that he can speak for the patient. Explained that Advance directive is not guardianship.  Son alleges to have called CW in chaplains presence, and was given different information. Son states he will discuss this with the director and he will make sure his mother (the patient) has no other visitors.

## 2023-02-28 NOTE — TOC Initial Note (Addendum)
Transition of Care [facility]) - Initial/Assessment Note    Patient Details  Name: Kaitlin [name] MRN: [id number] Date of Birth: October 18, 1926  Transition of Care [facility]) CM/SW Contact:    Kingsley Plan, RN Phone Number: 02/28/2023, 3:51 PM  Clinical Narrative:                  Sherron Monday to patient, and son Kathlene November at bedside   Discussed PT recommendations for SNF for short term rehab. Patient and Kathlene November declined at this time. Patient plans to discharge to [facility]'s home. Discussed home health PT. Kathlene November and patient declined home health . Kathlene November feels that he can provide care for his mother and when she wants home health , he will call PCP to arrange home health .   Patient has walker and cane at home, needs bedside commode. Bedside commode ordered through [employer].   Patient currently on oxygen in the hospital. She does not have home oxygen. If needed will need qualifying oxygen saturation note and order    Expected Discharge Plan: Skilled Nursing Facility Barriers to Discharge: Continued Medical Work up   Patient Goals and CMS Choice Patient states their goals for this hospitalization and ongoing recovery are:: to return to [facility] home          Expected Discharge Plan and Services   Discharge Planning Services: CM Consult Post Acute Care Choice: Durable Medical Equipment Living arrangements for the past 2 months: Single Family Home                 DME Arranged: 3-N-1 DME Agency: [employer] Date DME Agency Contacted: 02/28/23 Time DME Agency Contacted: 1550 Representative spoke with at DME Agency: Vaughan Basta HH Arranged: Refused HH          Prior Living Arrangements/Services Living arrangements for the past 2 months: Single Family Home Lives with:: Adult Children Patient language and need for interpreter reviewed:: Yes Do you feel safe going back to the place where you live?: Yes      Need for Family Participation in Patient Care: Yes (Comment) Care giver support  system in place?: Yes (comment) Current home services: DME Criminal Activity/Legal Involvement Pertinent to Current Situation/Hospitalization: No - Comment as needed  Activities of Daily Living Home Assistive Devices/Equipment: Cane (specify quad or straight), Walker (specify type), Wheelchair ADL Screening (condition at time of admission) Patient's cognitive ability adequate to safely complete daily activities?: Yes Is the patient deaf or have difficulty hearing?: No Does the patient have difficulty seeing, even when wearing glasses/contacts?: No Does the patient have difficulty concentrating, remembering, or making decisions?: No Patient able to express need for assistance with ADLs?: No Does the patient have difficulty dressing or bathing?: Yes Independently performs ADLs?: No Does the patient have difficulty walking or climbing stairs?: Yes Weakness of Legs: Both Weakness of Arms/Hands: Both  Permission Sought/Granted   Permission granted to share information with : Yes, Verbal Permission Granted  Share Information with NAME: Kathlene November son           Emotional Assessment Appearance:: Appears stated age Attitude/Demeanor/Rapport: Engaged Affect (typically observed): Accepting Orientation: : Oriented to Self, Oriented to  Time, Oriented to Place Alcohol / Substance Use: Not Applicable Psych Involvement: No (comment)  Admission diagnosis:  Acute respiratory failure with hypoxia (HCC) [J96.01] Sepsis due to pneumonia (HCC) [J18.9, A41.9] Community acquired pneumonia, unspecified laterality [J18.9] Patient Active Problem List   Diagnosis Date Noted   Pleural effusion 02/25/2023   Multilobar lung infiltrate 02/25/2023  Mass of right lung 02/25/2023   Hyponatremia 02/24/2023   Anxiety 02/24/2023   Coronary artery disease 02/24/2023   Acute respiratory failure with hypoxia (HCC) 02/24/2023   Community acquired pneumonia 02/24/2023   Sepsis due to pneumonia (HCC) 02/23/2023    Protein-calorie malnutrition, severe 02/07/2023   Sacral fracture, closed (HCC) 02/05/2023   Urinary tract infection without hematuria 10/29/2022   DNR (do not resuscitate) 09/27/2022   Acute pyonephrosis 09/26/2022   Goals of care, counseling/discussion 09/26/2022   Intractable low back pain 09/25/2022   Acute GI bleeding 09/19/2022   Multiple lung nodules on CT 09/19/2022   Acute blood loss anemia 09/19/2022   Chronic pain 09/19/2022   Underweight 09/19/2022   Dysphagia 10/18/2021   Lumbar stenosis with neurogenic claudication 10/29/2018   History of stroke 02/11/2018   Hyperlipidemia 08/13/2017   Fatigue 07/14/2017   Lymphadenopathy 07/14/2017   Fever and chills 07/14/2017   Left leg weakness 03/07/2017   Monoclonal paraproteinemia 03/07/2017   Persistent atrial fibrillation (HCC) 03/07/2017   Esophageal dysmotility 10/18/2014   Osteoarthritis    Tachy-brady syndrome (HCC)    Chest pain    NSTEMI (non-ST elevated myocardial infarction) (HCC) 01/25/2014   Essential hypertension, benign 08/11/2013   Sinus node dysfunction/post termination pauses    Chronic right lower quadrant pain 11/12/2011   COLONIC POLYPS, HX OF 04/12/2010   ANXIETY DEPRESSION 02/07/2009   PERIPHERAL NEUROPATHY 02/07/2009   Mitral valve disorder 02/07/2009   OSTEOARTHRITIS 02/07/2009   Chronic back pain 02/07/2009   INSOMNIA 02/07/2009   GERD 06/23/2008   Irritable bowel syndrome 06/23/2008   Diverticulosis of colon with hemorrhage 12/07/2007   PCP:  Thana Ates, MD Pharmacy:   [id number] - [place], [place] - [address] [place] [address] Phone: [phone] Fax: [phone]  [id number] - [place], [place] - [address] AT [address] [facility] [address] [place] [address] Phone: [phone] Fax: [phone]     Social Determinants of Health (SDOH) Social History: SDOH Screenings   Food Insecurity: No Food  Insecurity (02/25/2023)  Housing: Low Risk  (02/25/2023)  Transportation Needs: No Transportation Needs (02/25/2023)  Utilities: Not At Risk (02/25/2023)  Tobacco Use: Low Risk  (02/27/2023)   SDOH Interventions:     Readmission Risk Interventions    09/25/2022   10:02 AM  Readmission Risk Prevention Plan  Post Dischage Appt Complete  Medication Screening Complete  Transportation Screening Complete

## 2023-02-28 NOTE — Progress Notes (Signed)
PROGRESS NOTE    Kaitlin [name]  ZOX:[id number] DOB: [birth year] DOA: 02/23/2023 PCP: Thana Ates, MD    Brief Narrative:   Patient is a 89 year old white female patient with past medical history of anxiety and depression, atrial fibrillation, CVA, GERD, peripheral neuropathy, tachybradycardia syndrome and history of non-ST elevation MI 2015 with normal coronaries, Esophageal dysmotility status postdilatation previously, MGUS was admitted hospital on 02/24/2023 with complaints of shortness of breath, productive cough.  Patient was noted to be hypoxic with a pulse ox of 80% on room air and was given DuoNeb and Solu-Medrol and was brought into the hospital.  In the ED she needed to 100% nonrebreather mask.  Of note patient did have a recent fall 4 weeks ago with coccygeal fracture and since then she had been having failure to thrive and decreased mobility with poor oral intake.  In the ED patient had elevated blood pressure and was subsequently on 4 L of oxygen by nasal cannula.  Initial labs showed hyponatremia with a sodium of 129 and BNP 327.  CBC showed significant leukocytosis at 31.6 with neutrophilia, and INR was 1.7.  EKG showed atrial fibrillation with controlled ventricular response.  Chest x-ray showed a small to moderate bilateral pleural effusion with RUL solid density 16 X19-(previously 7X 8 mm)-follows with Dr. Everardo All of pulmonary last seen 12/05/2022 and surveillance was warranted at that time as family elected for this mainly.  During hospitalization, pulmonary was consulted for possibility of loculated effusion and thoracocentesis was done with removal of 650 mL of fluid.  Patient was continued on azithromycin and Rocephin.   Assessment and plan.  Multifocal pneumonia with bilateral parapneumonic effusions. Status post thoracocentesis.  Pleural fluid culture with no organisms so far.  Blood cultures negative. Continue  Mucinex, as needed, albuterol.  Procalcitonin low.  Pleural  fluid cytology with no malignant cells.  On IV Rocephin and Zithromax.   Currently on 3 L of oxygen by nasal cannula.  Afebrile.  Still has  leukocytosis at but trending down at 17.9 from 22.6< 23 K yesterday.  Overall clinical improvement noted.  Patient more alert awake today.  Tachybradycardia syndrome/ A-fib  CHADVASC >6 patient was started on Toprol-XL 12.5 mg due to elevated blood pressure.  Has been started on Eliquis.   Toxic encephalopathy on admission.  MRI of the brain without any acute findings.  History of lacunar infarct 2018.  Has been started on Eliquis.   Chronic esophageal dysmotility with multiple dilatations Continue dysphagia 2 diet, put on 45 degrees while eating.     Previous NSTEMI 01/2014 with normal coronaries Continue metoprolol   MGUS Will need outpatient follow-up    RUL mass.  Enlarging. Previously seen by Dr. Revonda Standard of pulmonology in the outpatient setting.  Family at that time was interested in active surveillance. Recent increase in size noted.  Pulmonary to follow as outpatient   Failure to thrive/debility, deconditioning. Decline since the last 6 months.  Patient appears to be extremely cachectic, debilitated and frail.  Prognosis was guarded.  Seeing some clinical improvement today.  Family hopeful about recovery.  CODE STATUS.  DNR.   DVT prophylaxis: apixaban (ELIQUIS) tablet 2.5 mg Start: 02/26/23 1000 Place and maintain sequential compression device Start: 02/24/23 1734 apixaban (ELIQUIS) tablet 2.5 mg   Code Status:     Code Status: DNR  Disposition: Skilled nursing facility in 1 to 2 days  Status is: Inpatient  Remains inpatient appropriate because: Pending clinical improvement, frailty, debility, IV antibiotics, need  for skilled nursing facility placement.   Family Communication:  I again spoke with the patient's daughter and son at bedside.  Consultants:  PCCM Pulmonary Palliative care  Procedures:   Thoracocentesis.  Antimicrobials:  Rocephin and Zithromax 5/12>  Subjective:  Today, patient was seen and examined at bedside.  Multiple family members at bedside.  Patient appears to be more alert awake and Communicative.  Denies overt shortness of breath or chest pain he still has some cough with phlegm production.  Family stating that he appears to be much better today.  Objective: Vitals:   02/27/23 1629 02/27/23 2138 02/28/23 0622 02/28/23 0744  BP: (!) 156/74 (!) 140/64 (!) 143/67 (!) 111/48  Pulse: 71 68 72 (!) 49  Resp: 16 17 16 16   Temp: (!) 97.3 F (36.3 C)     TempSrc: Oral     SpO2: 98% 99% 98% 98%  Weight:      Height:        Intake/Output Summary (Last 24 hours) at 02/28/2023 1142 Last data filed at 02/28/2023 0606 Gross per 24 hour  Intake 2923.05 ml  Output 400 ml  Net 2523.05 ml    Filed Weights   02/23/23 2220  Weight: 39.9 kg    Physical Examination: Body mass index is 15.58 kg/m.   General: Alert awake and Communicative, appears frail weak and deconditioned.  On 3 L of oxygen by nasal cannula.  HENT:   No scleral pallor or icterus noted. Oral mucosa is moist.  Chest:    Diminished breath sounds bilaterally.   CVS: S1 &S2 heard. No murmur.  Regular rate and rhythm. Abdomen: Soft, nontender, nondistended.  Bowel sounds are heard.   Extremities: No cyanosis, clubbing or edema.  Peripheral pulses are palpable. Psych: Alert, awake and Communicative, weak and frail. CNS:  No cranial nerve deficits.  Generalized weakness noted. Skin: Warm and dry.  No rashes noted.  Data Reviewed:   CBC: Recent Labs  Lab 02/23/23 2221 02/23/23 2247 02/24/23 0004 02/24/23 0442 02/26/23 0440 02/27/23 0111 02/28/23 0754  WBC 31.6*  --   --  34.6* 23.0* 22.6* 17.9*  NEUTROABS 27.4*  --   --   --  19.7*  --   --   HGB 12.3   < > 13.6 11.5* 11.0* 11.9* 11.2*  HCT 40.0   < > 40.0 36.6 36.3 38.4 36.9  MCV 84.6  --   --  84.3 84.2 85.0 87.2  PLT 469*  --   --   464* 348 312 299   < > = values in this interval not displayed.     Basic Metabolic Panel: Recent Labs  Lab 02/24/23 0442 02/25/23 0928 02/26/23 0440 02/27/23 0111 02/28/23 0754  NA 128* 133* 135 134* 136  K 4.2 4.5 4.1 4.4 4.3  CL 90* 97* 98 95* 97*  CO2 26 28 29 29 30   GLUCOSE 153* 117* 106* 91 87  BUN 17 20 17 12 10   CREATININE 0.47 0.43* 0.43* 0.48 0.42*  CALCIUM 8.5* 8.9 8.7* 8.5* 8.5*  MG  --   --   --  1.8 1.8     Liver Function Tests: Recent Labs  Lab 02/23/23 2221  AST 23  ALT 15  ALKPHOS 183*  BILITOT 0.6  PROT 6.5  ALBUMIN 2.4*      Radiology Studies: No results found.    LOS: 5 days    Joycelyn Das, MD [facility] Available via Epic secure chat 7am-7pm After these hours, please  refer to coverage provider listed on [email or web address] 02/28/2023, 11:42 AM

## 2023-02-28 NOTE — TOC Initial Note (Addendum)
Transition of Care [facility]) - Initial/Assessment Note    Patient Details  Name: Kaitlin [name] MRN: [id number] Date of Birth: 1926/12/16  [facility]) CM/SW Contact:    Deatra Robinson, [place] Phone Number: 02/28/2023, 3:35 PM  Clinical Narrative: Spoke with pt's son Kathlene November re recommendation for SNF. Pt's son currently refusing SNF and plans for pt to return home with family support and HH. Son reports pt with no DME in place and thinks pt may need a RW and BSC. Discussed pending Palliative Medicine consult and son reports he does not think it will be needed. TOC will follow for dc planning needs.   Dellie Burns, MSW, LCSW [phone] (coverage)                   Expected Discharge Plan: Home w Home Health Services Barriers to Discharge: Continued Medical Work up   Patient Goals and CMS Choice            Expected Discharge Plan and Services   Discharge Planning Services: CM Consult   Living arrangements for the past 2 months: Single Family Home                                      Prior Living Arrangements/Services Living arrangements for the past 2 months: Single Family Home Lives with:: Adult Children          Need for Family Participation in Patient Care: Yes (Comment) Care giver support system in place?: Yes (comment)      Activities of Daily Living Home Assistive Devices/Equipment: Cane (specify quad or straight), Walker (specify type), Wheelchair ADL Screening (condition at time of admission) Patient's cognitive ability adequate to safely complete daily activities?: Yes Is the patient deaf or have difficulty hearing?: No Does the patient have difficulty seeing, even when wearing glasses/contacts?: No Does the patient have difficulty concentrating, remembering, or making decisions?: No Patient able to express need for assistance with ADLs?: No Does the patient have difficulty dressing or bathing?: Yes Independently performs  ADLs?: No Does the patient have difficulty walking or climbing stairs?: Yes Weakness of Legs: Both Weakness of Arms/Hands: Both  Permission Sought/Granted                  Emotional Assessment       Orientation: : Oriented to Self, Oriented to Place, Oriented to  Time, Oriented to Situation, Fluctuating Orientation (Suspected and/or reported Sundowners) Alcohol / Substance Use: Not Applicable Psych Involvement: No (comment)  Admission diagnosis:  Acute respiratory failure with hypoxia (HCC) [J96.01] Sepsis due to pneumonia (HCC) [J18.9, A41.9] Community acquired pneumonia, unspecified laterality [J18.9] Patient Active Problem List   Diagnosis Date Noted   Pleural effusion 02/25/2023   Multilobar lung infiltrate 02/25/2023   Mass of right lung 02/25/2023   Hyponatremia 02/24/2023   Anxiety 02/24/2023   Coronary artery disease 02/24/2023   Acute respiratory failure with hypoxia (HCC) 02/24/2023   Community acquired pneumonia 02/24/2023   Sepsis due to pneumonia (HCC) 02/23/2023   Protein-calorie malnutrition, severe 02/07/2023   Sacral fracture, closed (HCC) 02/05/2023   Urinary tract infection without hematuria 10/29/2022   DNR (do not resuscitate) 09/27/2022   Acute pyonephrosis 09/26/2022   Goals of care, counseling/discussion 09/26/2022   Intractable low back pain 09/25/2022   Acute GI bleeding 09/19/2022   Multiple lung nodules on CT 09/19/2022   Acute blood loss anemia 09/19/2022  Chronic pain 09/19/2022   Underweight 09/19/2022   Dysphagia 10/18/2021   Lumbar stenosis with neurogenic claudication 10/29/2018   History of stroke 02/11/2018   Hyperlipidemia 08/13/2017   Fatigue 07/14/2017   Lymphadenopathy 07/14/2017   Fever and chills 07/14/2017   Left leg weakness 03/07/2017   Monoclonal paraproteinemia 03/07/2017   Persistent atrial fibrillation (HCC) 03/07/2017   Esophageal dysmotility 10/18/2014   Osteoarthritis    Tachy-brady syndrome (HCC)     Chest pain    NSTEMI (non-ST elevated myocardial infarction) (HCC) 01/25/2014   Essential hypertension, benign 08/11/2013   Sinus node dysfunction/post termination pauses    Chronic right lower quadrant pain 11/12/2011   COLONIC POLYPS, HX OF 04/12/2010   ANXIETY DEPRESSION 02/07/2009   PERIPHERAL NEUROPATHY 02/07/2009   Mitral valve disorder 02/07/2009   OSTEOARTHRITIS 02/07/2009   Chronic back pain 02/07/2009   INSOMNIA 02/07/2009   GERD 06/23/2008   Irritable bowel syndrome 06/23/2008   Diverticulosis of colon with hemorrhage 12/07/2007   PCP:  Thana Ates, MD Pharmacy:   [id number] - [place], [place] - [address] [place] [address] Phone: [phone] Fax: [phone]  [id number] - [place], [place] - [address] AT [address] [facility] [address] [place] [address] Phone: [phone] Fax: [phone]     Social Determinants of Health (SDOH) Social History: SDOH Screenings   Food Insecurity: No Food Insecurity (02/25/2023)  Housing: Low Risk  (02/25/2023)  Transportation Needs: No Transportation Needs (02/25/2023)  Utilities: Not At Risk (02/25/2023)  Tobacco Use: Low Risk  (02/27/2023)   SDOH Interventions:     Readmission Risk Interventions    09/25/2022   10:02 AM  Readmission Risk Prevention Plan  Post Dischage Appt Complete  Medication Screening Complete  Transportation Screening Complete

## 2023-03-01 DIAGNOSIS — Z66 Do not resuscitate: Secondary | ICD-10-CM

## 2023-03-01 DIAGNOSIS — Z7189 Other specified counseling: Secondary | ICD-10-CM

## 2023-03-01 DIAGNOSIS — Z515 Encounter for palliative care: Secondary | ICD-10-CM

## 2023-03-01 DIAGNOSIS — I4819 Other persistent atrial fibrillation: Secondary | ICD-10-CM | POA: Diagnosis not present

## 2023-03-01 DIAGNOSIS — J9601 Acute respiratory failure with hypoxia: Secondary | ICD-10-CM | POA: Diagnosis not present

## 2023-03-01 DIAGNOSIS — E871 Hypo-osmolality and hyponatremia: Secondary | ICD-10-CM | POA: Diagnosis not present

## 2023-03-01 DIAGNOSIS — J189 Pneumonia, unspecified organism: Secondary | ICD-10-CM | POA: Diagnosis not present

## 2023-03-01 LAB — BASIC METABOLIC PANEL
Anion gap: 6 (ref 5–15)
BUN: 9 mg/dL (ref 8–23)
CO2: 31 mmol/L (ref 22–32)
Calcium: 8.5 mg/dL — ABNORMAL LOW (ref 8.9–10.3)
Chloride: 97 mmol/L — ABNORMAL LOW (ref 98–111)
Creatinine, Ser: 0.42 mg/dL — ABNORMAL LOW (ref 0.44–1.00)
GFR, Estimated: >60 mL/min (ref >60)
Glucose, Bld: 99 mg/dL (ref 70–99)
Potassium: 4.1 mmol/L (ref 3.5–5.1)
Sodium: 134 mmol/L — ABNORMAL LOW (ref 135–145)

## 2023-03-01 MED ORDER — METOPROLOL SUCCINATE ER 25 MG PO TB24
12.5000 mg | ORAL_TABLET | Freq: Every day | ORAL | Status: DC
  Administered 2023-03-02: 12.5 mg via ORAL
  Filled 2023-03-01: qty 1

## 2023-03-01 NOTE — Progress Notes (Signed)
02 weaned to 1LPM, sats maintaining 98% while awake. Sons at bedside report patient mouth breathing while asleep and occasionally waking up as if gasping for breath.02 increased to 1.5LPM while sleeping for comfort.

## 2023-03-01 NOTE — Consult Note (Signed)
Palliative Care Consult Note                                  Date: 03/01/2023   Patient Name: Kaitlin [name]  DOB: 06-22-1927  MRN: [id number]  Age / Sex: 89 y.o., female  PCP: Thana Ates, MD Referring Physician: Joycelyn Das, MD  Reason for Consultation: Establishing goals of care  HPI/Patient Profile: Palliative Care consult requested for goals of care discussion in this 89 y.o. female  with past medical history of anxiety, depression, GERD, atrial fibrillation, CVA, MI, Tachy-brady syndrome, peripheral neuropathy, and s/p fall (April 2024) resulting in coccyx fracture, and back pain. She was admitted on 02/23/2023 from home with shortness of breath and cough. Initially required nonrebreather but has successfully tolerated 4L nasal cannula.   Past Medical History:  Diagnosis Date   Anxiety and depression    Atrial fibrillation (HCC)    Chest pain    a. 03/2005 MV: Ef 79%, no ischemia.   CVA (cerebral vascular accident) (HCC)    small lacunar infarcts in the left cerebellum and pons in 02/2017   Diverticulosis    Fever, recurrent    GERD (gastroesophageal reflux disease)    Heart attack (HCC) 01/2014   widely patent cors, ?spasm   History of colon polyps    IBS (irritable bowel syndrome)    Insomnia    Laryngeal trauma    penetration   Lumbar back pain    Osteoarthritis    Peripheral neuropathy    Tachy-brady syndrome (HCC)    a. Post termination of 4 seconds - refused PPM.     Subjective:   This NP Royal [facility]thorn reviewed medical records, received report from team, assessed the patient and then met at the patient's bedside with [name], her son/HCPOA, Kaitlin [name] and grandson, Kaitlin [name]. to discuss diagnosis, prognosis, GOC, wishes disposition and options.  [name] is awake and alert. Able to engage in discussions however allows son to lead most of conversation.    Concept of Palliative Care was introduced as  specialized medical care for people and their families living with serious illness.  It focuses on providing relief from the symptoms and stress of a serious illness.  The goal is to improve quality of life for both the patient and the family. Values and goals of care important to patient and family were attempted to be elicited.  Icreated space and opportunity for patient and family to explore state of health prior to admission, thoughts, and feelings.   [name] was living in the home alone several months-weeks prior. Since recent fall moved in with her son Kaitlin [name] for additional support. Widowed. She has 3 children and lots of grandchildren. She worked for more than 34 years at [employer].   Prior to recent fall patient was mostly independent in the home. Since fall patient has experienced increasing fatigue, decreased appetite, and requiring more assistance from family.   We discussed Her current illness and what it means in the larger context of Her on-going co-morbidities. Natural disease trajectory and expectations were discussed.  Patient and family verbalized understanding of current illness and co-morbidities. They speak to their family's longevity sharing pictures of patient's sister who is 73 years old and very active. Kaitlin [name] shares their goal is to focus on patient's health and treating the treatable to allow her to return home. Family is declining SNF or any home health  needs outside of equipment at this time. Shares she has a good support system and feels that family can and will provide the care she needs.   Patient and family expresses some back and sacral discomfort. RN to administer PRNs. Patient has not been able to sleep well throughout the night despite managing pain and use of Xanax. Education provided on relaxation techniques. She has trazadone ordered as needed. Also Robaxin as needed for muscle spasms.   I discussed the importance of continued conversation with family and their  medical providers regarding overall plan of care and treatment options, ensuring decisions are within the context of the patients values and GOCs.  Questions and concerns were addressed. The family was encouraged to call with questions or concerns.  PMT will continue to support holistically as needed.  Objective:   Primary Diagnoses: Present on Admission:  Sepsis due to pneumonia (HCC)  Persistent atrial fibrillation (HCC)   Scheduled Meds:  apixaban  2.5 mg Oral BID   calcium carbonate  1 tablet Oral BID WC   guaiFENesin  600 mg Oral BID   lidocaine (PF)  5 mL Intradermal Once   nystatin  5 mL Oral QID    Continuous Infusions:   PRN Meds: acetaminophen **OR** acetaminophen, ALPRAZolam, ipratropium-albuterol, labetalol, methocarbamol, nitroGLYCERIN, ondansetron **OR** ondansetron (ZOFRAN) IV, oxyCODONE-acetaminophen, polyethylene glycol, senna-docusate, traZODone  Allergies  Allergen Reactions   Dilaudid [Hydromorphone Hcl] Other (See Comments)    Dropped heart rate really low   Ciprofloxacin Other (See Comments)    Dizziness   Doxycycline     Sever dizziness and nausea   Gabapentin     Loss of balance, "weird feeling"   Codeine Nausea And Vomiting and Rash   Morphine Nausea And Vomiting and Rash   Morphine And Codeine Nausea And Vomiting and Rash   Nizatidine Other (See Comments)    Unknown reaction     Review of Systems  Constitutional:  Positive for activity change and fatigue.  Musculoskeletal:  Positive for arthralgias.  Neurological:  Positive for weakness.  Unless otherwise noted, a complete review of systems is negative.  Physical Exam General: frail, complains of discomfot Cardiovascular: regular rate and rhythm Pulmonary: diminished bilaterally  Abdomen: soft, nontender, + bowel sounds Extremities: no edema, no joint deformities Skin: no rashes, warm and dry Neurological: AAO x3  Vital Signs:  BP (!) 150/75 (BP Location: Right Arm)   Pulse 78    Temp 97.6 F (36.4 C) (Oral)   Resp 19   Ht 5\' 3"  (1.6 m)   Wt 39.9 kg   SpO2 98%   BMI 15.58 kg/m  Pain Scale: 0-10 POSS *See Group Information*: S-Acceptable,Sleep, easy to arouse Pain Score: Asleep  SpO2: SpO2: 98 % O2 Device:SpO2: 98 % O2 Flow Rate: .O2 Flow Rate (L/min): 3 L/min  IO: Intake/output summary:  Intake/Output Summary (Last 24 hours) at 03/01/2023 1610 Last data filed at 03/01/2023 0636 Gross per 24 hour  Intake 1281.95 ml  Output 550 ml  Net 731.95 ml    LBM: Last BM Date : 02/22/23 Baseline Weight: Weight: 39.9 kg Most recent weight: Weight: 39.9 kg      Palliative Assessment/Data:    Advanced Care Planning:   Primary Decision Maker: PATIENT and HCPOA  Code Status/Advance Care Planning: DNR  A discussion was had today regarding advanced directives. Concepts specific to code status, artifical feeding and hydration, continued IV antibiotics and rehospitalization was had.    Patient is DNR.   Assessment & Plan:  SUMMARY OF RECOMMENDATIONS   DNR/DNI-as confirmed by family Continue with current plan of care. Continue to treat the treatable allowing patient every opportunity to improve/thrive. Patient and family verbalized goals to return home with family support. Remaining hopeful for improvement and stability. Speaks to family history of longevity.  Son declines SNF or any home health support. Family feels they can provide all care needs with appropriate equipment.  PMT will continue to support and follow as needed. Please call team line with urgent needs. Will not see daily in the setting of goals clear and set.  Symptom Management:  Per Attending  Palliative Prophylaxis:  Aspiration, Bowel Regimen, Delirium Protocol, Frequent Pain Assessment, and Turn Reposition  Additional Recommendations (Limitations, Scope, Preferences): Full Scope Treatment  Psycho-social/Spiritual:  Desire for further Chaplaincy support: no Additional Recommendations:  goals of care  Prognosis:  Guarded  Discharge Planning:  Home with family. Declines SNF or homehealth.      Patient and family expressed understanding and was in agreement with this plan.   Time Total: 55 min  Visit consisted of counseling and education dealing with the complex and emotionally intense issues of symptom management and palliative care in the setting of serious and potentially life-threatening illness.Greater than 50%  of this time was spent counseling and coordinating care related to the above assessment and plan.  Signed by:  Willette Alma, AGPCNP-BC Palliative [facility]   Phone: [phone] Pager: [phone] Amion: Thea Alken   Thank you for allowing the Palliative Medicine Team to assist in the care of this patient. Please utilize secure chat with additional questions, if there is no response within 30 minutes please call the above phone number. Palliative Medicine Team providers are available by phone from 7am to 5pm daily and can be reached through the team cell phone.  Should this patient require assistance outside of these hours, please call the patient's attending physician.  *Please note that this is a verbal dictation therefore any spelling or grammatical errors are due to the "Dragon [employer] One" system interpretation.

## 2023-03-01 NOTE — Progress Notes (Signed)
PROGRESS NOTE    Kaitlin [name]  WUJ:[id number] DOB: May 01, 1927 DOA: 02/23/2023 PCP: Thana Ates, MD    Brief Narrative:   Patient is a 89 year old white female patient with past medical history of anxiety and depression, atrial fibrillation, CVA, GERD, peripheral neuropathy, tachybradycardia syndrome and history of non-ST elevation MI 2015 with normal coronaries, Esophageal dysmotility status postdilatation previously, MGUS was admitted hospital on 02/24/2023 with complaints of shortness of breath, productive cough.  Patient was noted to be hypoxic with a pulse ox of 80% on room air and was given DuoNeb and Solu-Medrol and was brought into the hospital.  In the ED she needed to 100% nonrebreather mask.  Of note patient did have a recent fall 4 weeks ago with coccygeal fracture and since then she had been having failure to thrive and decreased mobility with poor oral intake.  In the ED patient had elevated blood pressure and was subsequently on 4 L of oxygen by nasal cannula.  Initial labs showed hyponatremia with a sodium of 129 and BNP 327.  CBC showed significant leukocytosis at 31.6 with neutrophilia, and INR was 1.7.  EKG showed atrial fibrillation with controlled ventricular response.  Chest x-ray showed a small to moderate bilateral pleural effusion with RUL solid density 16 X19-(previously 7X 8 mm)-follows with Dr. Everardo All of pulmonary last seen 12/05/2022 and surveillance was warranted at that time as family elected for this mainly.  During hospitalization, pulmonary was consulted for possibility of loculated effusion and thoracocentesis was done with removal of 650 mL of fluid.  Patient was continued on azithromycin and Rocephin.    Assessment and plan.  Multifocal pneumonia with bilateral parapneumonic effusions. Status post thoracocentesis.  Pleural fluid culture with no organisms so far.  Blood cultures negative. Continue  Mucinex, as needed, albuterol.  Procalcitonin low.  Pleural  fluid cytology with no malignant cells.  Has completed course of IV Rocephin and Zithromax.   Currently on 3 L of oxygen by nasal cannula.  Continue to wean oxygen.  Clinically has improved.  Afebrile.  Still has  leukocytosis but trending down.  Check CBC in AM.    Tachybradycardia syndrome/ A-fib  CHADVASC >6 patient. Continue Eliquis.  Will add metoprolol XL 12.5 mg daily.   Toxic encephalopathy on admission.  MRI of the brain without any acute findings.  History of lacunar infarct 2018.  Continue Eliquis   Chronic esophageal dysmotility with multiple dilatations Continue dysphagia 2 diet, put on 45 degrees while eating.  Chronic issue   Previous NSTEMI 01/2014 with normal coronaries Continue metoprolol   MGUS Will need outpatient follow-up    RUL mass.  Enlarging. Previously seen by Dr. Revonda Standard of pulmonology in the outpatient setting.  Family at that time was interested in active surveillance. Recent increase in size noted.  Pulmonary to follow as outpatient   Failure to thrive/debility, deconditioning. Decline since the last 6 months.  Patient appears to be  cachectic, debilitated and frail.  Prognosis is guarded.  Still on 3 L oxygen.  Has remained stable with improving mentation and energy.  CODE STATUS.  DNR.   DVT prophylaxis: apixaban (ELIQUIS) tablet 2.5 mg Start: 02/26/23 1000 Place and maintain sequential compression device Start: 02/24/23 1734 apixaban (ELIQUIS) tablet 2.5 mg   Code Status:     Code Status: DNR  Disposition: PT has recommended skilled nursing facility but family wishes to go home with home health likely in 1 to 2 days  Status is: Inpatient  Remains inpatient appropriate  because: Pending clinical improvement, frailty, debility, on supplemental oxygen   Family Communication:  I spoke with the patient's sons at bedside. Consultants:  PCCM Pulmonary Palliative care  Procedures:  Thoracocentesis.  Antimicrobials:  Rocephin and Zithromax  5/12>  Subjective:  Today, patient was seen and examined at bedside.  Sons at bedside feeding her.  Denies overt pain, nausea, vomiting, fever, chills or rigor.  Has some cough with some productive sputum.  Communicative.  Objective: Vitals:   02/28/23 1625 02/28/23 1948 03/01/23 0443 03/01/23 0550  BP: (!) 154/65 129/62 (!) 150/75   Pulse: 76 69 78   Resp: 18 17 19    Temp: 98 F (36.7 C)   97.6 F (36.4 C)  TempSrc: Oral   Oral  SpO2: 97% 99% 98%   Weight:      Height:        Intake/Output Summary (Last 24 hours) at 03/01/2023 1037 Last data filed at 03/01/2023 0636 Gross per 24 hour  Intake 1281.95 ml  Output 550 ml  Net 731.95 ml    Filed Weights   02/23/23 2220  Weight: 39.9 kg    Physical Examination: Body mass index is 15.58 kg/m.   General: Thinly built, alert awake and Communicative, appears frail weak and deconditioned.  On nasal cannula oxygen.   HENT:   No scleral pallor or icterus noted. Oral mucosa is moist.  Chest:    Diminished breath sounds bilaterally.  No overt wheezes or crackles. CVS: S1 &S2 heard. No murmur.  Regular rate and rhythm. Abdomen: Soft, nontender, nondistended.  Bowel sounds are heard.   Extremities: No cyanosis, clubbing or edema.  Peripheral pulses are palpable. Psych: Alert, awake and Communicative, weak and frail. CNS:  No cranial nerve deficits.  Generalized weakness noted. Skin: Warm and dry.  No rashes noted.  Data Reviewed:   CBC: Recent Labs  Lab 02/23/23 2221 02/23/23 2247 02/24/23 0004 02/24/23 0442 02/26/23 0440 02/27/23 0111 02/28/23 0754  WBC 31.6*  --   --  34.6* 23.0* 22.6* 17.9*  NEUTROABS 27.4*  --   --   --  19.7*  --   --   HGB 12.3   < > 13.6 11.5* 11.0* 11.9* 11.2*  HCT 40.0   < > 40.0 36.6 36.3 38.4 36.9  MCV 84.6  --   --  84.3 84.2 85.0 87.2  PLT 469*  --   --  464* 348 312 299   < > = values in this interval not displayed.     Basic Metabolic Panel: Recent Labs  Lab 02/25/23 0928  02/26/23 0440 02/27/23 0111 02/28/23 0754 03/01/23 0131  NA 133* 135 134* 136 134*  K 4.5 4.1 4.4 4.3 4.1  CL 97* 98 95* 97* 97*  CO2 28 29 29 30 31   GLUCOSE 117* 106* 91 87 99  BUN 20 17 12 10 9   CREATININE 0.43* 0.43* 0.48 0.42* 0.42*  CALCIUM 8.9 8.7* 8.5* 8.5* 8.5*  MG  --   --  1.8 1.8  --      Liver Function Tests: Recent Labs  Lab 02/23/23 2221  AST 23  ALT 15  ALKPHOS 183*  BILITOT 0.6  PROT 6.5  ALBUMIN 2.4*      Radiology Studies: No results found.    LOS: 6 days    Joycelyn Das, MD [facility] Available via Epic secure chat 7am-7pm After these hours, please refer to coverage provider listed on [email or web address] 03/01/2023, 10:37 AM

## 2023-03-01 NOTE — Plan of Care (Signed)

## 2023-03-02 DIAGNOSIS — J189 Pneumonia, unspecified organism: Secondary | ICD-10-CM | POA: Diagnosis not present

## 2023-03-02 DIAGNOSIS — E871 Hypo-osmolality and hyponatremia: Secondary | ICD-10-CM | POA: Diagnosis not present

## 2023-03-02 DIAGNOSIS — J9601 Acute respiratory failure with hypoxia: Secondary | ICD-10-CM | POA: Diagnosis not present

## 2023-03-02 DIAGNOSIS — I4819 Other persistent atrial fibrillation: Secondary | ICD-10-CM | POA: Diagnosis not present

## 2023-03-02 LAB — CBC
HCT: 35.5 % — ABNORMAL LOW (ref 36.0–46.0)
Hemoglobin: 10.9 g/dL — ABNORMAL LOW (ref 12.0–15.0)
MCH: 26 pg (ref 26.0–34.0)
MCHC: 30.7 g/dL (ref 30.0–36.0)
MCV: 84.5 fL (ref 80.0–100.0)
Platelets: 287 10*3/uL (ref 150–400)
RBC: 4.2 MIL/uL (ref 3.87–5.11)
RDW: 16 % — ABNORMAL HIGH (ref 11.5–15.5)
WBC: 14.3 10*3/uL — ABNORMAL HIGH (ref 4.0–10.5)
nRBC: 0 % (ref 0.0–0.2)

## 2023-03-02 LAB — BASIC METABOLIC PANEL
Anion gap: 6 (ref 5–15)
BUN: 6 mg/dL — ABNORMAL LOW (ref 8–23)
CO2: 34 mmol/L — ABNORMAL HIGH (ref 22–32)
Calcium: 8.4 mg/dL — ABNORMAL LOW (ref 8.9–10.3)
Chloride: 90 mmol/L — ABNORMAL LOW (ref 98–111)
Creatinine, Ser: 0.36 mg/dL — ABNORMAL LOW (ref 0.44–1.00)
GFR, Estimated: >60 mL/min (ref >60)
Glucose, Bld: 112 mg/dL — ABNORMAL HIGH (ref 70–99)
Potassium: 3.8 mmol/L (ref 3.5–5.1)
Sodium: 130 mmol/L — ABNORMAL LOW (ref 135–145)

## 2023-03-02 LAB — MAGNESIUM: Magnesium: 1.8 mg/dL (ref 1.7–2.4)

## 2023-03-02 NOTE — Progress Notes (Addendum)
PROGRESS NOTE    Jahnae Macnamara  ZOX:[id number] DOB: Aug 22, 1927 DOA: 02/23/2023 PCP: Thana Ates, MD    Brief Narrative:   Patient is a 89 year old white female patient with past medical history of anxiety and depression, atrial fibrillation, CVA, GERD, peripheral neuropathy, tachybradycardia syndrome and history of non-ST elevation MI 2015 with normal coronaries, Esophageal dysmotility status postdilatation previously, MGUS was admitted hospital on 02/24/2023 with complaints of shortness of breath, productive cough.  Patient was noted to be hypoxic with a pulse ox of 80% on room air and was given DuoNeb and Solu-Medrol and was brought into the hospital.  In the ED she needed to 100% nonrebreather mask.  Of note patient did have a recent fall 4 weeks ago with coccygeal fracture and since then she had been having failure to thrive and decreased mobility with poor oral intake.  In the ED patient had elevated blood pressure and was subsequently on 4 L of oxygen by nasal cannula.  Initial labs showed hyponatremia with a sodium of 129 and BNP 327.  CBC showed significant leukocytosis at 31.6 with neutrophilia, and INR was 1.7.  EKG showed atrial fibrillation with controlled ventricular response.  Chest x-ray showed a small to moderate bilateral pleural effusion with RUL solid density 16 X19-(previously 7X 8 mm)-follows with Dr. Everardo All of pulmonary last seen 12/05/2022 and surveillance was warranted at that time as family elected for this mainly.  During hospitalization, pulmonary was consulted for possibility of loculated effusion and thoracocentesis was done with removal of 650 mL of fluid.  Patient was continued on azithromycin and Rocephin.    Assessment and plan.  Multifocal pneumonia with bilateral parapneumonic effusions. Status post thoracocentesis.  Pleural fluid culture with no organisms so far.  Blood cultures negative. Continue  Mucinex, as needed, albuterol.  Procalcitonin low.  Pleural  fluid cytology with no malignant cells.  Has completed course of IV Rocephin and Zithromax.  Patient is currently on room air and will continue to wean off oxygen as.  Clinically has improved.  Afebrile.  Leukocytosis trending down.    Tachybradycardia syndrome/ A-fib  CHADVASC >6 patient. Continue Eliquis.  Continue metoprolol XL 12.5 mg daily.   Toxic encephalopathy on admission.  Resolved at this time.  MRI of the brain without any acute findings.  History of lacunar infarct 2018.  Continue Eliquis   Chronic esophageal dysmotility with multiple dilatations Continue dysphagia 2 diet, put on 45 degrees while eating.  Chronic issue   Previous NSTEMI 01/2014 with normal coronaries Continue metoprolol   MGUS Will need outpatient follow-up    RUL mass.  Enlarging. Previously seen by Dr. Revonda Standard of pulmonology in the outpatient setting.  Family at that time was interested in active surveillance. Recent increase in size noted.  Pulmonary to follow as outpatient   Failure to thrive/debility, deconditioning. Decline since the last 6 months.  Patient appears to be  cachectic, debilitated and frail.  Prognosis is guarded long-term. Has remained stable with improving mentation and energy.  CODE STATUS.  DNR.   DVT prophylaxis: apixaban (ELIQUIS) tablet 2.5 mg Start: 02/26/23 1000 Place and maintain sequential compression device Start: 02/24/23 1734 apixaban (ELIQUIS) tablet 2.5 mg   Code Status:     Code Status: DNR  Disposition:  PT has recommended skilled nursing facility but family wishes to go home with home health likely on 03/03/2023 if patient remains stable without oxygen  Status is: Inpatient  Remains inpatient appropriate because: Pending clinical improvement, frailty, debility, on supplemental  oxygen   Family Communication:  I spoke with the patient's sons at bedside.  Consultants:  PCCM Pulmonary Palliative care  Procedures:  Thoracocentesis.  Antimicrobials:   Rocephin and Zithromax 5/12>  Subjective:  Today, patient was seen and examined at bedside.  Denies any nausea vomiting fever chills or rigor.  Patient's son at bedside.  Off oxygen this morning.  Has mild cough.  Objective: Vitals:   03/01/23 1627 03/01/23 2124 03/02/23 0518 03/02/23 0800  BP: (!) 157/78 (!) 167/80 100/78 (!) 152/90  Pulse: 79 69 72 63  Resp: 18 16 18 18   Temp: 97.8 F (36.6 C) 97.7 F (36.5 C)    TempSrc:  Oral  Oral  SpO2: 95% 99% 98% 97%  Weight:      Height:        Intake/Output Summary (Last 24 hours) at 03/02/2023 1107 Last data filed at 03/02/2023 0141 Gross per 24 hour  Intake --  Output 600 ml  Net -600 ml    Filed Weights   02/23/23 2220  Weight: 39.9 kg    Physical Examination: Body mass index is 15.58 kg/m.   General: Alert awake and Communicative, frail weak and deconditioned, on nasal cannula oxygen, thinly built. HENT:   No scleral pallor or icterus noted. Oral mucosa is moist.  Chest:    Diminished breath sounds bilaterally.  No overt wheezes or crackles. CVS: S1 &S2 heard. No murmur.  Regular rate and rhythm. Abdomen: Soft, nontender, nondistended.  Bowel sounds are heard.   Extremities: No cyanosis, clubbing or edema.  Peripheral pulses are palpable. Psych: Alert, awake and Communicative, weak and frail. CNS:  No cranial nerve deficits.  Generalized weakness noted. Skin: Warm and dry.  No rashes noted.  Data Reviewed:   CBC: Recent Labs  Lab 02/23/23 2221 02/23/23 2247 02/24/23 0442 02/26/23 0440 02/27/23 0111 02/28/23 0754 03/02/23 0139  WBC 31.6*  --  34.6* 23.0* 22.6* 17.9* 14.3*  NEUTROABS 27.4*  --   --  19.7*  --   --   --   HGB 12.3   < > 11.5* 11.0* 11.9* 11.2* 10.9*  HCT 40.0   < > 36.6 36.3 38.4 36.9 35.5*  MCV 84.6  --  84.3 84.2 85.0 87.2 84.5  PLT 469*  --  464* 348 312 299 287   < > = values in this interval not displayed.     Basic Metabolic Panel: Recent Labs  Lab 02/26/23 0440 02/27/23 0111  02/28/23 0754 03/01/23 0131 03/02/23 0139  NA 135 134* 136 134* 130*  K 4.1 4.4 4.3 4.1 3.8  CL 98 95* 97* 97* 90*  CO2 29 29 30 31  34*  GLUCOSE 106* 91 87 99 112*  BUN 17 12 10 9  6*  CREATININE 0.43* 0.48 0.42* 0.42* 0.36*  CALCIUM 8.7* 8.5* 8.5* 8.5* 8.4*  MG  --  1.8 1.8  --  1.8     Liver Function Tests: Recent Labs  Lab 02/23/23 2221  AST 23  ALT 15  ALKPHOS 183*  BILITOT 0.6  PROT 6.5  ALBUMIN 2.4*      Radiology Studies: No results found.    LOS: 7 days    Joycelyn Das, MD [facility] Available via Epic secure chat 7am-7pm After these hours, please refer to coverage provider listed on [email or web address] 03/02/2023, 11:07 AM

## 2023-03-02 NOTE — Progress Notes (Signed)
Patient reports she is ready to accept visitors. Music therapist notified.

## 2023-03-02 NOTE — Progress Notes (Signed)
Patient verbalized to nurse that she would not like any visitors at this time expect for son Kaitlin [name] who is at bedside as she would like to rest. Press photographer informed and Diplomatic Services operational officer to notify visitor's desk. Patient educated that she can change her mind at any time and expressed understanding.

## 2023-03-02 NOTE — Plan of Care (Signed)

## 2023-03-03 DIAGNOSIS — J9601 Acute respiratory failure with hypoxia: Secondary | ICD-10-CM | POA: Diagnosis not present

## 2023-03-03 DIAGNOSIS — E871 Hypo-osmolality and hyponatremia: Secondary | ICD-10-CM | POA: Diagnosis not present

## 2023-03-03 DIAGNOSIS — I4819 Other persistent atrial fibrillation: Secondary | ICD-10-CM | POA: Diagnosis not present

## 2023-03-03 DIAGNOSIS — J189 Pneumonia, unspecified organism: Secondary | ICD-10-CM | POA: Diagnosis not present

## 2023-03-03 MED ORDER — GUAIFENESIN ER 600 MG PO TB12: 600.0000 mg | ORAL_TABLET | Freq: Two times a day (BID) | ORAL | 0 refills | Status: DC

## 2023-03-03 MED ORDER — ADULT MULTIVITAMIN LIQUID CH: 15.0000 mL | Freq: Every day | ORAL | 0 refills | Status: DC

## 2023-03-03 MED ORDER — ONDANSETRON HCL 4 MG PO TABS: 4.0000 mg | ORAL_TABLET | Freq: Every day | ORAL | 1 refills | Status: DC | PRN

## 2023-03-03 MED ORDER — ALBUTEROL SULFATE HFA 108 (90 BASE) MCG/ACT IN AERS: 2.0000 | INHALATION_SPRAY | Freq: Four times a day (QID) | RESPIRATORY_TRACT | 0 refills | Status: DC | PRN

## 2023-03-03 MED ORDER — CALCIUM CARBONATE ANTACID 500 MG PO CHEW: 1.0000 | CHEWABLE_TABLET | Freq: Two times a day (BID) | ORAL | 1 refills | Status: DC

## 2023-03-03 MED ORDER — NYSTATIN 100000 UNIT/ML MT SUSP: 5.0000 mL | Freq: Four times a day (QID) | OROMUCOSAL | 0 refills | Status: AC

## 2023-03-03 MED ORDER — METHOCARBAMOL 500 MG PO TABS: 500.0000 mg | ORAL_TABLET | Freq: Four times a day (QID) | ORAL | 0 refills | Status: DC | PRN

## 2023-03-03 NOTE — Progress Notes (Signed)
Pt periwick has been removed by family to go to the restroom.

## 2023-03-03 NOTE — Discharge Summary (Signed)
Physician Discharge Summary  Kaitlin [name] ZOX:[id number] DOB: 06-09-1927 DOA: 02/23/2023  PCP: Thana Ates, MD  Admit date: 02/23/2023 Discharge date: 03/03/2023  Admitted From: Home  Discharge disposition: Home  Recommendations for Outpatient Follow-Up:   Follow up with your primary care provider in one week.  Check CBC, BMP, magnesium in the next visit Recommend follow-up with pulmonary as outpatient for pulmonary nodule.  Discharge Diagnosis:   Principal Problem:   Sepsis due to pneumonia [facility]) Active Problems:   Acute respiratory failure with hypoxia (HCC)   Hyponatremia   Persistent atrial fibrillation (HCC)   Anxiety   Coronary artery disease   Community acquired pneumonia   Pleural effusion   Multilobar lung infiltrate   Mass of right lung   Discharge Condition: Improved.  Diet recommendation: Low sodium, heart healthy.    Wound care: None.  Code status: Full.   History of Present Illness:   Patient is a 89 year old white female patient with past medical history of anxiety and depression, atrial fibrillation, CVA, GERD, peripheral neuropathy, tachybradycardia syndrome and history of non-ST elevation MI 2015 with normal coronaries, esophageal dysmotility status postdilatation previously, MGUS was admitted hospital on 02/24/2023 with complaints of shortness of breath, productive cough.  Patient was noted to be hypoxic with a pulse ox of 80% on room air and was given DuoNeb and Solu-Medrol and was brought into the hospital.  In the ED she needed to 100% nonrebreather mask.  Of note patient did have a recent fall 4 weeks ago with coccygeal fracture and since then she had been having failure to thrive and decreased mobility with poor oral intake.  In the ED patient had elevated blood pressure and was subsequently on 4 L of oxygen by nasal cannula.  Initial labs showed hyponatremia with a sodium of 129 and BNP 327.  CBC showed significant leukocytosis at 31.6  with neutrophilia, and INR was 1.7.  EKG showed atrial fibrillation with controlled ventricular response.  Chest x-ray showed a small to moderate bilateral pleural effusion with RUL solid density 16 X19-(previously 7X 8 mm)-follows with Dr. Everardo All of pulmonary last seen 12/05/2022 and surveillance was warranted at that time as family elected for active surveillance.     Hospital Course:   Following conditions were addressed during hospitalization as listed below,  Multifocal pneumonia with bilateral parapneumonic effusions. During hospitalization, pulmonary was consulted for pleural effusion and thoracocentesis was done with removal of 650 mL of fluid.  Pleural fluid culture with no organisms.  Blood cultures with negative. Continue  Mucinex, as needed, albuterol.  Procalcitonin low.  Pleural fluid cytology with no malignant cells.  Has completed course of IV Rocephin and Zithromax.  Patient is currently on room air. Clinically has improved.  Afebrile.  Leukocytosis trending down latest WBC at 14.3.     Tachybradycardia syndrome/ A-fib  CHADVASC >6 patient. Continue Eliquis.  Patient does not wish to be on metoprolol. It was discontinued in the past.   Toxic encephalopathy on admission.  Resolved at this time.  MRI of the brain without any acute findings.  History of lacunar infarct 2018.  Continue Eliquis   Chronic esophageal dysmotility with multiple dilatations Continue dysphagia 2 diet, put on 45 degrees while eating.  Chronic issue.  Aspiration precautions emphasized.   Previous NSTEMI 01/2014 with normal coronaries Appears to be on metoprolol.   MGUS Will need outpatient follow-up    RUL mass.  Enlarging. Previously seen by Dr. Revonda Standard of pulmonology in the outpatient setting.  Family at that time was interested in active surveillance. Recent increase in size noted.  Recommend follow-up with pulmonary as outpatient.   Failure to thrive/debility, deconditioning. Decline since the  last 6 months.  Patient appears to be  cachectic, debilitated and frail.  Prognosis is guarded long-term. Has remained stable with improving mentation and energy.  Disposition.  At this time, patient is stable for disposition home with outpatient PCP and pulmonary  Medical Consultants:   PCCM Pulmonary Palliative care  Procedures:    Thoracocentesis.  Subjective:   Today, patient was seen and examined at bedside.  Feels better.  No nausea vomiting fever shortness of breath chest pain or palpitation.  Family wishes to take the patient home.  Discharge Exam:   Vitals:   03/03/23 0625 03/03/23 0833  BP: 133/65 (!) 137/59  Pulse: 75 74  Resp: 18 16  Temp: 97.6 F (36.4 C) (!) 97.5 F (36.4 C)  SpO2: 97% 92%   Vitals:   03/02/23 1627 03/02/23 2036 03/03/23 0625 03/03/23 0833  BP: (!) 167/83 (!) 167/61 133/65 (!) 137/59  Pulse: (!) 59 60 75 74  Resp: 16 18 18 16   Temp: 98 F (36.7 C) 98.4 F (36.9 C) 97.6 F (36.4 C) (!) 97.5 F (36.4 C)  TempSrc: Oral Oral Oral Oral  SpO2: 94% 99% 97% 92%  Weight:      Height:       Body mass index is 15.58 kg/m.   General: Alert awake, not in obvious distress, thinly built,weak and deconditioned, elderly female. HENT: pupils equally reacting to light,  No scleral pallor or icterus noted. Oral mucosa is moist.  Chest:    Diminished breath sounds bilaterally.  Coarse breath sounds noted. CVS: S1 &S2 heard. No murmur.  Regular rate and rhythm. Abdomen: Soft, nontender, nondistended.  Bowel sounds are heard.   Extremities: No cyanosis, clubbing or edema.  Peripheral pulses are palpable. Psych: Alert, awake and Communicative, weak and deconditioned CNS:  No cranial nerve deficits.  Generalized weakness noted Skin: Warm and dry.  No rashes noted.  The results of significant diagnostics from this hospitalization (including imaging, microbiology, ancillary and laboratory) are listed below for reference.     Diagnostic Studies:   MR  BRAIN WO CONTRAST  Result Date: 02/24/2023 CLINICAL DATA:  Encephalopathy, facial droop. EXAM: MRI HEAD WITHOUT CONTRAST TECHNIQUE: Multiplanar, multiecho pulse sequences of the brain and surrounding structures were obtained without intravenous contrast. COMPARISON:  MRI brain 03/07/2017.  Head CT 01/21/2023. FINDINGS: Brain: No acute infarct or hemorrhage. Stable mild chronic small-vessel disease. Volume is within expected range for age. No hydrocephalus or extra-axial collection. No mass or midline shift. No foci of abnormal susceptibility. Vascular: Normal flow voids. Skull and upper cervical spine: Normal marrow signal. Sinuses/Orbits: Unremarkable. Other: None. IMPRESSION: No acute intracranial process. Electronically Signed   By: Orvan Falconer M.D.   On: 02/24/2023 20:35   CT Angio Chest Pulmonary Embolism (PE) W or WO Contrast  Result Date: 02/24/2023 CLINICAL DATA:  Lung mass, cough. EXAM: CT ANGIOGRAPHY CHEST WITH CONTRAST TECHNIQUE: Multidetector CT imaging of the chest was performed using the standard protocol during bolus administration of intravenous contrast. Multiplanar CT image reconstructions and MIPs were obtained to evaluate the vascular anatomy. RADIATION DOSE REDUCTION: This exam was performed according to the departmental dose-optimization program which includes automated exposure control, adjustment of the mA and/or kV according to patient size and/or use of iterative reconstruction technique. CONTRAST:  75mL OMNIPAQUE IOHEXOL 350 MG/ML SOLN COMPARISON:  Chest x-ray 02/23/2023.  CT of the chest 01/21/2023. FINDINGS: Cardiovascular: Heart is enlarged. There is no pericardial effusion. Aorta is normal in size. There are atherosclerotic calcifications of the aorta. There is adequate opacification of the pulmonary arteries to the segmental level. There is no evidence for pulmonary embolism. Mediastinum/Nodes: There are enlarged right hilar lymph nodes measuring up to 11 mm short axis. No  other enlarged mediastinal lymph nodes are seen. Visualized esophagus and thyroid gland are within normal limits. Lungs/Pleura: There are new small to moderate-sized bilateral pleural effusions, right greater than left. Biapical pleural-parenchymal scarring appears stable. Bilobed nodular density in the right upper lobe with nodular components measuring 6 mm and minimal surrounding ground-glass opacity appears unchanged from prior. There is new ill-defined nodular and tree-in-bud opacities in the bilateral inferior lower lobes, right middle lobe and inferior upper lobes bilaterally. There is additional airspace consolidation with air bronchograms in the right middle lobe, lingula and right lower lobe. There is compressive atelectasis of the left lower lobe. Trachea and central airways are patent. There is no pneumothorax. Upper Abdomen: No acute abnormality. Musculoskeletal: No chest wall abnormality. Mild compression deformities of the superior endplates of T3, T4 and T5 have slightly progressed. Review of the MIP images confirms the above findings. IMPRESSION: 1. No evidence for pulmonary embolism. 2. New small to moderate-sized bilateral pleural effusions, right greater than left. 3. New ill-defined nodular and tree-in-bud opacities in the inferior upper lobes, right middle lobe and right lower lobe worrisome for multifocal pneumonia. 4. New airspace consolidation in the right middle lobe, right lower lobe and lingula. 5. Stable bilobed nodular density in the right upper lobe measuring 6 mm. 6. Stable cardiomegaly. 7. Right hilar lymphadenopathy. 8. Progression of mild compression deformities of the superior endplates of T3, T4 and T5. Aortic Atherosclerosis (ICD10-I70.0). Electronically Signed   By: Darliss Cheney M.D.   On: 02/24/2023 19:26   DG Chest Port 1 View  Result Date: 02/23/2023 CLINICAL DATA:  Shortness of breath.  Fall recently. EXAM: PORTABLE CHEST 1 VIEW COMPARISON:  03/18/2022, 01/21/2023.  FINDINGS: The heart borders are obscured and mediastinal contours are within normal limits. There is atherosclerotic calcification of the aorta. Apical pleural scarring is noted bilaterally. There is redemonstration of a nodular density in the right upper lobe. There is patchy airspace disease in the mid to lower lung fields with small to moderate pleural effusions. No acute osseous abnormality. IMPRESSION: 1. Small to moderate bilateral pleural effusions with atelectasis, edema, or infiltrate at the lung bases. 2. Nodular density in the right upper lobe, better evaluated on recent CT. Electronically Signed   By: Thornell Sartorius M.D.   On: 02/23/2023 23:03     Labs:   Basic Metabolic Panel: Recent Labs  Lab 02/26/23 0440 02/27/23 0111 02/28/23 0754 03/01/23 0131 03/02/23 0139  NA 135 134* 136 134* 130*  K 4.1 4.4 4.3 4.1 3.8  CL 98 95* 97* 97* 90*  CO2 29 29 30 31  34*  GLUCOSE 106* 91 87 99 112*  BUN 17 12 10 9  6*  CREATININE 0.43* 0.48 0.42* 0.42* 0.36*  CALCIUM 8.7* 8.5* 8.5* 8.5* 8.4*  MG  --  1.8 1.8  --  1.8   GFR Estimated Creatinine Clearance: 27.7 mL/min (A) (by C-G formula based on SCr of 0.36 mg/dL (L)). Liver Function Tests: No results for input(s): "AST", "ALT", "ALKPHOS", "BILITOT", "PROT", "ALBUMIN" in the last 168 hours. No results for input(s): "LIPASE", "AMYLASE" in the last 168 hours. No  results for input(s): "AMMONIA" in the last 168 hours. Coagulation profile No results for input(s): "INR", "PROTIME" in the last 168 hours.  CBC: Recent Labs  Lab 02/26/23 0440 02/27/23 0111 02/28/23 0754 03/02/23 0139  WBC 23.0* 22.6* 17.9* 14.3*  NEUTROABS 19.7*  --   --   --   HGB 11.0* 11.9* 11.2* 10.9*  HCT 36.3 38.4 36.9 35.5*  MCV 84.2 85.0 87.2 84.5  PLT 348 312 299 287   Cardiac Enzymes: No results for input(s): "CKTOTAL", "CKMB", "CKMBINDEX", "TROPONINI" in the last 168 hours. BNP: Invalid input(s): "POCBNP" CBG: No results for input(s): "GLUCAP" in the  last 168 hours. D-Dimer No results for input(s): "DDIMER" in the last 72 hours. Hgb A1c No results for input(s): "HGBA1C" in the last 72 hours. Lipid Profile No results for input(s): "CHOL", "HDL", "LDLCALC", "TRIG", "CHOLHDL", "LDLDIRECT" in the last 72 hours. Thyroid function studies No results for input(s): "TSH", "T4TOTAL", "T3FREE", "THYROIDAB" in the last 72 hours.  Invalid input(s): "FREET3" Anemia work up No results for input(s): "VITAMINB12", "FOLATE", "FERRITIN", "TIBC", "IRON", "RETICCTPCT" in the last 72 hours. Microbiology Recent Results (from the past 240 hour(s))  Resp panel by RT-PCR (RSV, Flu A&B, Covid) Anterior Nasal Swab     Status: None   Collection Time: 02/23/23 10:22 PM   Specimen: Anterior Nasal Swab  Result Value Ref Range Status   SARS Coronavirus 2 by RT PCR NEGATIVE NEGATIVE Final   Influenza A by PCR NEGATIVE NEGATIVE Final   Influenza B by PCR NEGATIVE NEGATIVE Final    Comment: (NOTE) The Xpert Xpress SARS-CoV-2/FLU/RSV plus assay is intended as an aid in the diagnosis of influenza from Nasopharyngeal swab specimens and should not be used as a sole basis for treatment. Nasal washings and aspirates are unacceptable for Xpert Xpress SARS-CoV-2/FLU/RSV testing.  Fact Sheet for Patients: BloggerCourse.com  Fact Sheet for Healthcare Providers: SeriousBroker.it  This test is not yet approved or cleared by the [place] FDA and has been authorized for detection and/or diagnosis of SARS-CoV-2 by FDA under an Emergency Use Authorization (EUA). This EUA will remain in effect (meaning this test can be used) for the duration of the COVID-19 declaration under Section 564(b)(1) of the Act, 21 U.S.C. section 360bbb-3(b)(1), unless the authorization is terminated or revoked.     Resp Syncytial Virus by PCR NEGATIVE NEGATIVE Final    Comment: (NOTE) Fact Sheet for  Patients: BloggerCourse.com  Fact Sheet for Healthcare Providers: SeriousBroker.it  This test is not yet approved or cleared by the [place] FDA and has been authorized for detection and/or diagnosis of SARS-CoV-2 by FDA under an Emergency Use Authorization (EUA). This EUA will remain in effect (meaning this test can be used) for the duration of the COVID-19 declaration under Section 564(b)(1) of the Act, 21 U.S.C. section 360bbb-3(b)(1), unless the authorization is terminated or revoked.  Performed at [facility], [address], [place], [place] [address]   Blood culture (routine x 2)     Status: None   Collection Time: 02/23/23 11:30 PM   Specimen: BLOOD  Result Value Ref Range Status   Specimen Description BLOOD SITE NOT SPECIFIED  Final   Special Requests   Final    BOTTLES DRAWN AEROBIC AND ANAEROBIC Blood Culture results may not be optimal due to an excessive volume of blood received in culture bottles   Culture   Final    NO GROWTH 5 DAYS Performed at [facility], [address] [place],  [place] [address]    Report Status 02/28/2023 FINAL  Final  Blood culture (routine x 2)     Status: None   Collection Time: 02/23/23 11:35 PM   Specimen: BLOOD LEFT FOREARM  Result Value Ref Range Status   Specimen Description BLOOD LEFT FOREARM  Final   Special Requests   Final    BOTTLES DRAWN AEROBIC AND ANAEROBIC Blood Culture results may not be optimal due to an excessive volume of blood received in culture bottles   Culture   Final    NO GROWTH 5 DAYS Performed at [facility], [address] [place], [place] [address]    Report Status 02/28/2023 FINAL  Final  Pleural fluid culture w Gram Stain     Status: None   Collection Time: 02/25/23  1:07 PM   Specimen: Pleural Fluid  Result Value Ref Range Status   Specimen Description PLEURAL  Final   Special Requests RIGHT  Final   Gram Stain   Final     ABUNDANT WBC PRESENT,BOTH PMN AND MONONUCLEAR NO ORGANISMS SEEN    Culture   Final    NO GROWTH 3 DAYS Performed at [facility], [address], [place], [place] [address]    Report Status 02/28/2023 FINAL  Final     Discharge Instructions:   Discharge Instructions     Call MD for:  difficulty breathing, headache or visual disturbances   Complete by: As directed    Call MD for:  severe uncontrolled pain   Complete by: As directed    Call MD for:  temperature >100.4   Complete by: As directed    Diet general   Complete by: As directed    Soft dysphagia 2 diet.   Discharge instructions   Complete by: As directed    Follow-up with your primary care provider in 1 week.  Check blood work at that time.  Seek medical attention for worsening symptoms.  Follow-up with pulmonary as outpatient for lung nodule..   Increase activity slowly   Complete by: As directed    No wound care   Complete by: As directed       Allergies as of 03/03/2023       Reactions   Dilaudid [hydromorphone Hcl] Other (See Comments)   Dropped heart rate really low   Ciprofloxacin Other (See Comments)   Dizziness   Doxycycline    Sever dizziness and nausea   Gabapentin    Loss of balance, "weird feeling"   Codeine Nausea And Vomiting, Rash   Morphine Nausea And Vomiting, Rash   Morphine And Codeine Nausea And Vomiting, Rash   Nizatidine Other (See Comments)   Unknown reaction         Medication List     TAKE these medications    albuterol 108 (90 Base) MCG/ACT inhaler Commonly known as: VENTOLIN HFA Inhale 2 puffs into the lungs every 6 (six) hours as needed for wheezing or shortness of breath.   ALPRAZolam 0.25 MG tablet Commonly known as: XANAX Take 0.125 mg by mouth 3 (three) times daily as needed for anxiety. For sleep   apixaban 2.5 MG Tabs tablet Commonly known as: ELIQUIS Take 1 tablet (2.5 mg total) by mouth 2 (two) times daily.   calcium carbonate 500 MG chewable  tablet Commonly known as: TUMS - dosed in mg elemental calcium Chew 1 tablet (200 mg of elemental calcium total) by mouth 2 (two) times daily.   cholecalciferol 1000 units tablet  Commonly known as: VITAMIN D Take 1,000 Units by mouth daily.   guaiFENesin 600 MG 12 hr tablet Commonly known as: MUCINEX Take 1 tablet (600 mg total) by mouth 2 (two) times daily for 10 days.   MAGNESIUM PO Take 2 tablets by mouth at bedtime.   methocarbamol 500 MG tablet Commonly known as: ROBAXIN Take 1 tablet (500 mg total) by mouth every 6 (six) hours as needed for muscle spasms.   multivitamin Liqd Take 15 mLs by mouth daily. What changed: additional instructions   nitroGLYCERIN 0.4 MG SL tablet Commonly known as: NITROSTAT Place 0.4 mg under the tongue every 5 (five) minutes as needed for chest pain.   nystatin 100000 UNIT/ML suspension Commonly known as: MYCOSTATIN Take 5 mLs (500,000 Units total) by mouth 4 (four) times daily for 7 days.   ondansetron 4 MG tablet Commonly known as: Zofran Take 1 tablet (4 mg total) by mouth daily as needed for nausea or vomiting.   oxyCODONE 5 MG immediate release tablet Commonly known as: Oxy IR/ROXICODONE Take 5 mg by mouth in the morning, at noon, and at bedtime.   oxyCODONE 10 mg 12 hr tablet Commonly known as: OXYCONTIN Take 1 tablet (10 mg total) by mouth every 12 (twelve) hours.   polyethylene glycol powder 17 GM/SCOOP powder Commonly known as: MiraLax Take 17 g by mouth 2 (two) times daily as needed for mild constipation. What changed:  how much to take when to take this   sennosides-docusate sodium 8.6-50 MG tablet Commonly known as: SENOKOT-S Take 1-2 tablets by mouth daily as needed for constipation.   VITAMIN B 12 PO Take 1 tablet by mouth daily.          Time coordinating discharge: 39 minutes  Signed:  Missey Hasley  [facility] 03/03/2023, 4:26 PM

## 2023-03-03 NOTE — Progress Notes (Signed)
Kaitlin [name] to be D/C'd  per MD order.  Discussed with the patient;s son Kathlene November and all questions fully answered.  VSS, Skin clean, dry and intact without evidence of skin break down, no evidence of skin tears noted.  IV catheter discontinued intact. Site without signs and symptoms of complications. Dressing and pressure applied.  An After Visit Summary was printed and given to the patient. Patient received prescription script for multivitamin.  D/c education completed with patient/family including follow up instructions, medication list, d/c activities limitations if indicated, with other d/c instructions as indicated by MD - patient able to verbalize understanding, all questions fully answered.   Patient instructed to return to ED, call 911, or call MD for any changes in condition.   Patient to be escorted via WC, and D/C home via private auto.

## 2023-03-04 ENCOUNTER — Other Ambulatory Visit: Payer: Self-pay

## 2023-03-04 ENCOUNTER — Encounter (HOSPITAL_COMMUNITY): Payer: Self-pay

## 2023-03-04 ENCOUNTER — Emergency Department (HOSPITAL_COMMUNITY): Payer: Medicare Other

## 2023-03-04 ENCOUNTER — Observation Stay (HOSPITAL_COMMUNITY): Payer: Medicare Other

## 2023-03-04 ENCOUNTER — Inpatient Hospital Stay (HOSPITAL_COMMUNITY)
Admission: EM | Admit: 2023-03-04 | Discharge: 2023-03-15 | DRG: 291 | Disposition: E | Payer: Medicare Other | Attending: Internal Medicine | Admitting: Internal Medicine

## 2023-03-04 DIAGNOSIS — E43 Unspecified severe protein-calorie malnutrition: Secondary | ICD-10-CM | POA: Diagnosis present

## 2023-03-04 DIAGNOSIS — Z823 Family history of stroke: Secondary | ICD-10-CM

## 2023-03-04 DIAGNOSIS — E871 Hypo-osmolality and hyponatremia: Secondary | ICD-10-CM

## 2023-03-04 DIAGNOSIS — E860 Dehydration: Secondary | ICD-10-CM | POA: Diagnosis present

## 2023-03-04 DIAGNOSIS — I2510 Atherosclerotic heart disease of native coronary artery without angina pectoris: Secondary | ICD-10-CM | POA: Diagnosis present

## 2023-03-04 DIAGNOSIS — F341 Dysthymic disorder: Secondary | ICD-10-CM | POA: Diagnosis present

## 2023-03-04 DIAGNOSIS — G8929 Other chronic pain: Secondary | ICD-10-CM | POA: Diagnosis present

## 2023-03-04 DIAGNOSIS — I495 Sick sinus syndrome: Secondary | ICD-10-CM | POA: Diagnosis present

## 2023-03-04 DIAGNOSIS — Z7189 Other specified counseling: Secondary | ICD-10-CM | POA: Diagnosis not present

## 2023-03-04 DIAGNOSIS — I071 Rheumatic tricuspid insufficiency: Secondary | ICD-10-CM | POA: Diagnosis present

## 2023-03-04 DIAGNOSIS — F32A Depression, unspecified: Secondary | ICD-10-CM | POA: Diagnosis present

## 2023-03-04 DIAGNOSIS — W19XXXA Unspecified fall, initial encounter: Secondary | ICD-10-CM | POA: Diagnosis not present

## 2023-03-04 DIAGNOSIS — R0609 Other forms of dyspnea: Secondary | ICD-10-CM | POA: Diagnosis not present

## 2023-03-04 DIAGNOSIS — R64 Cachexia: Secondary | ICD-10-CM | POA: Diagnosis present

## 2023-03-04 DIAGNOSIS — I110 Hypertensive heart disease with heart failure: Secondary | ICD-10-CM | POA: Diagnosis present

## 2023-03-04 DIAGNOSIS — Z8249 Family history of ischemic heart disease and other diseases of the circulatory system: Secondary | ICD-10-CM

## 2023-03-04 DIAGNOSIS — D649 Anemia, unspecified: Secondary | ICD-10-CM | POA: Diagnosis present

## 2023-03-04 DIAGNOSIS — Z833 Family history of diabetes mellitus: Secondary | ICD-10-CM

## 2023-03-04 DIAGNOSIS — T17908A Unspecified foreign body in respiratory tract, part unspecified causing other injury, initial encounter: Secondary | ICD-10-CM

## 2023-03-04 DIAGNOSIS — J9811 Atelectasis: Secondary | ICD-10-CM | POA: Diagnosis present

## 2023-03-04 DIAGNOSIS — R531 Weakness: Secondary | ICD-10-CM

## 2023-03-04 DIAGNOSIS — Z681 Body mass index (BMI) 19 or less, adult: Secondary | ICD-10-CM

## 2023-03-04 DIAGNOSIS — I272 Pulmonary hypertension, unspecified: Secondary | ICD-10-CM | POA: Diagnosis present

## 2023-03-04 DIAGNOSIS — R509 Fever, unspecified: Secondary | ICD-10-CM | POA: Diagnosis not present

## 2023-03-04 DIAGNOSIS — Z515 Encounter for palliative care: Secondary | ICD-10-CM | POA: Diagnosis not present

## 2023-03-04 DIAGNOSIS — K219 Gastro-esophageal reflux disease without esophagitis: Secondary | ICD-10-CM | POA: Diagnosis present

## 2023-03-04 DIAGNOSIS — G4700 Insomnia, unspecified: Secondary | ICD-10-CM | POA: Diagnosis present

## 2023-03-04 DIAGNOSIS — J90 Pleural effusion, not elsewhere classified: Secondary | ICD-10-CM | POA: Diagnosis not present

## 2023-03-04 DIAGNOSIS — R4589 Other symptoms and signs involving emotional state: Secondary | ICD-10-CM | POA: Diagnosis not present

## 2023-03-04 DIAGNOSIS — I50813 Acute on chronic right heart failure: Secondary | ICD-10-CM | POA: Diagnosis present

## 2023-03-04 DIAGNOSIS — Z66 Do not resuscitate: Secondary | ICD-10-CM | POA: Diagnosis present

## 2023-03-04 DIAGNOSIS — G9341 Metabolic encephalopathy: Secondary | ICD-10-CM | POA: Diagnosis present

## 2023-03-04 DIAGNOSIS — D472 Monoclonal gammopathy: Secondary | ICD-10-CM | POA: Diagnosis present

## 2023-03-04 DIAGNOSIS — Z8673 Personal history of transient ischemic attack (TIA), and cerebral infarction without residual deficits: Secondary | ICD-10-CM

## 2023-03-04 DIAGNOSIS — R627 Adult failure to thrive: Secondary | ICD-10-CM | POA: Diagnosis not present

## 2023-03-04 DIAGNOSIS — Z885 Allergy status to narcotic agent status: Secondary | ICD-10-CM

## 2023-03-04 DIAGNOSIS — I3139 Other pericardial effusion (noninflammatory): Secondary | ICD-10-CM | POA: Diagnosis present

## 2023-03-04 DIAGNOSIS — I4819 Other persistent atrial fibrillation: Secondary | ICD-10-CM | POA: Diagnosis present

## 2023-03-04 DIAGNOSIS — M545 Low back pain, unspecified: Secondary | ICD-10-CM | POA: Diagnosis present

## 2023-03-04 DIAGNOSIS — R109 Unspecified abdominal pain: Secondary | ICD-10-CM | POA: Diagnosis not present

## 2023-03-04 DIAGNOSIS — Z79899 Other long term (current) drug therapy: Secondary | ICD-10-CM

## 2023-03-04 DIAGNOSIS — G629 Polyneuropathy, unspecified: Secondary | ICD-10-CM | POA: Diagnosis present

## 2023-03-04 DIAGNOSIS — R54 Age-related physical debility: Secondary | ICD-10-CM | POA: Diagnosis present

## 2023-03-04 DIAGNOSIS — F419 Anxiety disorder, unspecified: Secondary | ICD-10-CM | POA: Diagnosis present

## 2023-03-04 DIAGNOSIS — J189 Pneumonia, unspecified organism: Secondary | ICD-10-CM | POA: Diagnosis not present

## 2023-03-04 DIAGNOSIS — Z881 Allergy status to other antibiotic agents status: Secondary | ICD-10-CM

## 2023-03-04 DIAGNOSIS — K30 Functional dyspepsia: Secondary | ICD-10-CM | POA: Diagnosis present

## 2023-03-04 DIAGNOSIS — E785 Hyperlipidemia, unspecified: Secondary | ICD-10-CM | POA: Diagnosis present

## 2023-03-04 DIAGNOSIS — I10 Essential (primary) hypertension: Secondary | ICD-10-CM | POA: Diagnosis present

## 2023-03-04 DIAGNOSIS — R0602 Shortness of breath: Secondary | ICD-10-CM | POA: Diagnosis not present

## 2023-03-04 DIAGNOSIS — I252 Old myocardial infarction: Secondary | ICD-10-CM

## 2023-03-04 DIAGNOSIS — T17908S Unspecified foreign body in respiratory tract, part unspecified causing other injury, sequela: Secondary | ICD-10-CM | POA: Diagnosis not present

## 2023-03-04 DIAGNOSIS — Z7901 Long term (current) use of anticoagulants: Secondary | ICD-10-CM

## 2023-03-04 DIAGNOSIS — Z808 Family history of malignant neoplasm of other organs or systems: Secondary | ICD-10-CM

## 2023-03-04 DIAGNOSIS — Z888 Allergy status to other drugs, medicaments and biological substances status: Secondary | ICD-10-CM

## 2023-03-04 DIAGNOSIS — J690 Pneumonitis due to inhalation of food and vomit: Secondary | ICD-10-CM | POA: Diagnosis present

## 2023-03-04 DIAGNOSIS — Z8261 Family history of arthritis: Secondary | ICD-10-CM

## 2023-03-04 DIAGNOSIS — E8770 Fluid overload, unspecified: Secondary | ICD-10-CM | POA: Diagnosis present

## 2023-03-04 LAB — ECHOCARDIOGRAM COMPLETE
Area-P 1/2: 5.42 cm2
P 1/2 time: 640 msec
S' Lateral: 2.5 cm

## 2023-03-04 LAB — BASIC METABOLIC PANEL
Anion gap: 8 (ref 5–15)
BUN: 8 mg/dL (ref 8–23)
CO2: 31 mmol/L (ref 22–32)
Calcium: 8 mg/dL — ABNORMAL LOW (ref 8.9–10.3)
Chloride: 86 mmol/L — ABNORMAL LOW (ref 98–111)
Creatinine, Ser: 0.36 mg/dL — ABNORMAL LOW (ref 0.44–1.00)
GFR, Estimated: >60 mL/min (ref >60)
Glucose, Bld: 105 mg/dL — ABNORMAL HIGH (ref 70–99)
Potassium: 3.9 mmol/L (ref 3.5–5.1)
Sodium: 125 mmol/L — ABNORMAL LOW (ref 135–145)

## 2023-03-04 LAB — BRAIN NATRIURETIC PEPTIDE: B Natriuretic Peptide: 321.7 pg/mL — ABNORMAL HIGH (ref 0.0–100.0)

## 2023-03-04 LAB — HEPATIC FUNCTION PANEL
ALT: 14 U/L (ref 0–44)
AST: 18 U/L (ref 15–41)
Albumin: 2.6 g/dL — ABNORMAL LOW (ref 3.5–5.0)
Alkaline Phosphatase: 108 U/L (ref 38–126)
Bilirubin, Direct: 0.2 mg/dL (ref 0.0–0.2)
Indirect Bilirubin: 0.6 mg/dL (ref 0.3–0.9)
Total Bilirubin: 0.8 mg/dL (ref 0.3–1.2)
Total Protein: 6.7 g/dL (ref 6.5–8.1)

## 2023-03-04 LAB — OSMOLALITY, URINE: Osmolality, Ur: 366 mOsm/kg (ref 300–900)

## 2023-03-04 LAB — CBC
HCT: 36.1 % (ref 36.0–46.0)
Hemoglobin: 11.6 g/dL — ABNORMAL LOW (ref 12.0–15.0)
MCH: 26.5 pg (ref 26.0–34.0)
MCHC: 32.1 g/dL (ref 30.0–36.0)
MCV: 82.6 fL (ref 80.0–100.0)
Platelets: 356 10*3/uL (ref 150–400)
RBC: 4.37 MIL/uL (ref 3.87–5.11)
RDW: 16.2 % — ABNORMAL HIGH (ref 11.5–15.5)
WBC: 12.4 10*3/uL — ABNORMAL HIGH (ref 4.0–10.5)
nRBC: 0 % (ref 0.0–0.2)

## 2023-03-04 LAB — URINALYSIS, ROUTINE W REFLEX MICROSCOPIC
Bacteria, UA: NONE SEEN
Bilirubin Urine: NEGATIVE
Glucose, UA: NEGATIVE mg/dL
Ketones, ur: NEGATIVE mg/dL
Leukocytes,Ua: NEGATIVE
Nitrite: NEGATIVE
Protein, ur: NEGATIVE mg/dL
Specific Gravity, Urine: 1.006 (ref 1.005–1.030)
pH: 8 (ref 5.0–8.0)

## 2023-03-04 LAB — MAGNESIUM: Magnesium: 1.8 mg/dL (ref 1.7–2.4)

## 2023-03-04 LAB — PHOSPHORUS: Phosphorus: 2.5 mg/dL (ref 2.5–4.6)

## 2023-03-04 LAB — SODIUM, URINE, RANDOM: Sodium, Ur: 158 mmol/L

## 2023-03-04 LAB — GLUCOSE, CAPILLARY: Glucose-Capillary: 114 mg/dL — ABNORMAL HIGH (ref 70–99)

## 2023-03-04 MED ORDER — FENTANYL CITRATE PF 50 MCG/ML IJ SOSY
12.5000 ug | PREFILLED_SYRINGE | Freq: Once | INTRAMUSCULAR | Status: AC
  Administered 2023-03-04: 12.5 ug via INTRAVENOUS
  Filled 2023-03-04: qty 1

## 2023-03-04 MED ORDER — VITAMIN B-12 100 MCG PO TABS
100.0000 ug | ORAL_TABLET | Freq: Every day | ORAL | Status: DC
  Administered 2023-03-04 – 2023-03-10 (×3): 100 ug via ORAL
  Filled 2023-03-04 (×5): qty 1

## 2023-03-04 MED ORDER — METHOCARBAMOL 1000 MG/10ML IJ SOLN
500.0000 mg | Freq: Once | INTRAVENOUS | Status: AC
  Administered 2023-03-04: 500 mg via INTRAVENOUS
  Filled 2023-03-04: qty 500

## 2023-03-04 MED ORDER — OXYCODONE HCL 5 MG PO TABS: 5.0000 mg | ORAL_TABLET | Freq: Four times a day (QID) | ORAL | Status: DC | PRN

## 2023-03-04 MED ORDER — ONDANSETRON HCL 4 MG PO TABS: 4.0000 mg | ORAL_TABLET | Freq: Four times a day (QID) | ORAL | Status: DC | PRN

## 2023-03-04 MED ORDER — VITAMIN D 25 MCG (1000 UNIT) PO TABS
1000.0000 [IU] | ORAL_TABLET | Freq: Every day | ORAL | Status: DC
  Administered 2023-03-07 – 2023-03-10 (×2): 1000 [IU] via ORAL
  Filled 2023-03-04 (×3): qty 1

## 2023-03-04 MED ORDER — ALPRAZOLAM 0.25 MG PO TABS
0.1250 mg | ORAL_TABLET | Freq: Three times a day (TID) | ORAL | Status: DC | PRN
  Administered 2023-03-06 – 2023-03-09 (×4): 0.125 mg via ORAL
  Filled 2023-03-04 (×5): qty 1

## 2023-03-04 MED ORDER — ALBUTEROL SULFATE (2.5 MG/3ML) 0.083% IN NEBU: 3.0000 mL | INHALATION_SOLUTION | Freq: Four times a day (QID) | RESPIRATORY_TRACT | Status: DC | PRN

## 2023-03-04 MED ORDER — MIRTAZAPINE 15 MG PO TABS
7.5000 mg | ORAL_TABLET | Freq: Every day | ORAL | Status: DC
  Administered 2023-03-06 – 2023-03-10 (×4): 7.5 mg via ORAL
  Filled 2023-03-04 (×4): qty 1

## 2023-03-04 MED ORDER — DEXTROSE-SODIUM CHLORIDE 5-0.9 % IV SOLN: INTRAVENOUS | Status: DC

## 2023-03-04 MED ORDER — LORAZEPAM 2 MG/ML IJ SOLN
0.2500 mg | Freq: Once | INTRAMUSCULAR | Status: DC
  Filled 2023-03-04 (×2): qty 1

## 2023-03-04 MED ORDER — MAGNESIUM OXIDE -MG SUPPLEMENT 400 (240 MG) MG PO TABS
400.0000 mg | ORAL_TABLET | Freq: Every day | ORAL | Status: DC
  Administered 2023-03-06 – 2023-03-10 (×4): 400 mg via ORAL
  Filled 2023-03-04 (×4): qty 1

## 2023-03-04 MED ORDER — ONDANSETRON HCL 4 MG/2ML IJ SOLN
4.0000 mg | Freq: Four times a day (QID) | INTRAMUSCULAR | Status: DC | PRN
  Administered 2023-03-07: 4 mg via INTRAVENOUS
  Filled 2023-03-04: qty 2

## 2023-03-04 MED ORDER — CALCIUM CARBONATE ANTACID 500 MG PO CHEW
1.0000 | CHEWABLE_TABLET | Freq: Two times a day (BID) | ORAL | Status: DC
  Administered 2023-03-06 – 2023-03-10 (×6): 200 mg via ORAL
  Filled 2023-03-04 (×7): qty 1

## 2023-03-04 MED ORDER — SENNOSIDES-DOCUSATE SODIUM 8.6-50 MG PO TABS: 1.0000 | ORAL_TABLET | Freq: Every day | ORAL | Status: DC | PRN

## 2023-03-04 MED ORDER — SODIUM CHLORIDE 0.9 % IV BOLUS
500.0000 mL | Freq: Once | INTRAVENOUS | Status: AC
  Administered 2023-03-04: 500 mL via INTRAVENOUS

## 2023-03-04 MED ORDER — ACETAMINOPHEN 325 MG PO TABS
650.0000 mg | ORAL_TABLET | Freq: Four times a day (QID) | ORAL | Status: DC | PRN
  Administered 2023-03-07 – 2023-03-08 (×2): 650 mg via ORAL
  Filled 2023-03-04 (×2): qty 2

## 2023-03-04 MED ORDER — ACETAMINOPHEN 650 MG RE SUPP: 650.0000 mg | Freq: Four times a day (QID) | RECTAL | Status: DC | PRN

## 2023-03-04 MED ORDER — IPRATROPIUM-ALBUTEROL 0.5-2.5 (3) MG/3ML IN SOLN
3.0000 mL | Freq: Four times a day (QID) | RESPIRATORY_TRACT | Status: DC
  Administered 2023-03-04: 3 mL via RESPIRATORY_TRACT
  Filled 2023-03-04: qty 3

## 2023-03-04 MED ORDER — NYSTATIN 100000 UNIT/ML MT SUSP
5.0000 mL | Freq: Four times a day (QID) | OROMUCOSAL | Status: DC
  Administered 2023-03-04 – 2023-03-10 (×7): 500000 [IU] via ORAL
  Filled 2023-03-04 (×11): qty 5

## 2023-03-04 MED ORDER — METHOCARBAMOL 500 MG PO TABS
500.0000 mg | ORAL_TABLET | Freq: Four times a day (QID) | ORAL | Status: DC | PRN
  Administered 2023-03-08: 500 mg via ORAL
  Filled 2023-03-04: qty 1

## 2023-03-04 MED ORDER — RISPERIDONE 0.5 MG PO TABS: 0.2500 mg | ORAL_TABLET | Freq: Every day | ORAL | Status: DC

## 2023-03-04 MED ORDER — IPRATROPIUM-ALBUTEROL 0.5-2.5 (3) MG/3ML IN SOLN
3.0000 mL | Freq: Two times a day (BID) | RESPIRATORY_TRACT | Status: DC
  Administered 2023-03-05 – 2023-03-11 (×14): 3 mL via RESPIRATORY_TRACT
  Filled 2023-03-04 (×14): qty 3

## 2023-03-04 MED ORDER — GUAIFENESIN ER 600 MG PO TB12
600.0000 mg | ORAL_TABLET | Freq: Two times a day (BID) | ORAL | Status: DC
  Administered 2023-03-06 – 2023-03-10 (×6): 600 mg via ORAL
  Filled 2023-03-04 (×8): qty 1

## 2023-03-04 MED ORDER — ADULT MULTIVITAMIN LIQUID CH
15.0000 mL | Freq: Every day | ORAL | Status: DC
  Administered 2023-03-07: 15 mL via ORAL
  Filled 2023-03-04 (×7): qty 15

## 2023-03-04 MED ORDER — LIDOCAINE 5 % EX PTCH
1.0000 | MEDICATED_PATCH | CUTANEOUS | Status: DC
  Administered 2023-03-04 – 2023-03-11 (×7): 1 via TRANSDERMAL
  Filled 2023-03-04 (×7): qty 1

## 2023-03-04 MED ORDER — NITROGLYCERIN 0.4 MG SL SUBL: 0.4000 mg | SUBLINGUAL_TABLET | SUBLINGUAL | Status: DC | PRN

## 2023-03-04 MED ORDER — POLYETHYLENE GLYCOL 3350 17 G PO PACK: 8.5000 g | PACK | Freq: Every day | ORAL | Status: DC | PRN

## 2023-03-04 MED ORDER — APIXABAN 2.5 MG PO TABS
2.5000 mg | ORAL_TABLET | Freq: Two times a day (BID) | ORAL | Status: DC
  Administered 2023-03-04 – 2023-03-11 (×9): 2.5 mg via ORAL
  Filled 2023-03-04 (×10): qty 1

## 2023-03-04 NOTE — ED Triage Notes (Signed)
BIB EMS SOB x 2 weeks Dx bacterial pneumonia, frequent ED visits. A&O x 4 9 days ago fractured coccyx r/t fall. C/o generalized weakness today.

## 2023-03-04 NOTE — Progress Notes (Signed)
  Echocardiogram 2D Echocardiogram has been performed.  Kaitlin [name] 03/04/2023, 2:11 PM
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
## 2023-03-04 NOTE — ED Notes (Signed)
Pt is currently on 2 liters of oxygen for comfort. Pt's oxygen saturation is at 99% off room air.

## 2023-03-05 ENCOUNTER — Ambulatory Visit (HOSPITAL_BASED_OUTPATIENT_CLINIC_OR_DEPARTMENT_OTHER): Payer: Medicare Other | Admitting: Pulmonary Disease

## 2023-03-05 DIAGNOSIS — E43 Unspecified severe protein-calorie malnutrition: Secondary | ICD-10-CM | POA: Diagnosis present

## 2023-03-05 DIAGNOSIS — I50813 Acute on chronic right heart failure: Secondary | ICD-10-CM | POA: Diagnosis present

## 2023-03-05 DIAGNOSIS — J9811 Atelectasis: Secondary | ICD-10-CM | POA: Diagnosis present

## 2023-03-05 DIAGNOSIS — Z7189 Other specified counseling: Secondary | ICD-10-CM | POA: Diagnosis not present

## 2023-03-05 DIAGNOSIS — J690 Pneumonitis due to inhalation of food and vomit: Secondary | ICD-10-CM | POA: Diagnosis present

## 2023-03-05 DIAGNOSIS — R64 Cachexia: Secondary | ICD-10-CM | POA: Diagnosis present

## 2023-03-05 DIAGNOSIS — R509 Fever, unspecified: Secondary | ICD-10-CM | POA: Diagnosis not present

## 2023-03-05 DIAGNOSIS — Z681 Body mass index (BMI) 19 or less, adult: Secondary | ICD-10-CM | POA: Diagnosis not present

## 2023-03-05 DIAGNOSIS — I110 Hypertensive heart disease with heart failure: Secondary | ICD-10-CM | POA: Diagnosis present

## 2023-03-05 DIAGNOSIS — Z515 Encounter for palliative care: Secondary | ICD-10-CM | POA: Diagnosis not present

## 2023-03-05 DIAGNOSIS — M545 Low back pain, unspecified: Secondary | ICD-10-CM | POA: Diagnosis present

## 2023-03-05 DIAGNOSIS — I4819 Other persistent atrial fibrillation: Secondary | ICD-10-CM | POA: Diagnosis present

## 2023-03-05 DIAGNOSIS — I3139 Other pericardial effusion (noninflammatory): Secondary | ICD-10-CM | POA: Diagnosis present

## 2023-03-05 DIAGNOSIS — I071 Rheumatic tricuspid insufficiency: Secondary | ICD-10-CM | POA: Diagnosis present

## 2023-03-05 DIAGNOSIS — E860 Dehydration: Secondary | ICD-10-CM | POA: Diagnosis present

## 2023-03-05 DIAGNOSIS — G8929 Other chronic pain: Secondary | ICD-10-CM | POA: Diagnosis present

## 2023-03-05 DIAGNOSIS — E785 Hyperlipidemia, unspecified: Secondary | ICD-10-CM | POA: Diagnosis present

## 2023-03-05 DIAGNOSIS — R54 Age-related physical debility: Secondary | ICD-10-CM | POA: Diagnosis present

## 2023-03-05 DIAGNOSIS — T17908S Unspecified foreign body in respiratory tract, part unspecified causing other injury, sequela: Secondary | ICD-10-CM | POA: Diagnosis not present

## 2023-03-05 DIAGNOSIS — R109 Unspecified abdominal pain: Secondary | ICD-10-CM | POA: Diagnosis not present

## 2023-03-05 DIAGNOSIS — F32A Depression, unspecified: Secondary | ICD-10-CM | POA: Diagnosis present

## 2023-03-05 DIAGNOSIS — I495 Sick sinus syndrome: Secondary | ICD-10-CM | POA: Diagnosis present

## 2023-03-05 DIAGNOSIS — G4700 Insomnia, unspecified: Secondary | ICD-10-CM | POA: Diagnosis present

## 2023-03-05 DIAGNOSIS — R0602 Shortness of breath: Secondary | ICD-10-CM | POA: Diagnosis not present

## 2023-03-05 DIAGNOSIS — D472 Monoclonal gammopathy: Secondary | ICD-10-CM | POA: Diagnosis present

## 2023-03-05 DIAGNOSIS — E871 Hypo-osmolality and hyponatremia: Secondary | ICD-10-CM | POA: Diagnosis present

## 2023-03-05 DIAGNOSIS — G9341 Metabolic encephalopathy: Secondary | ICD-10-CM | POA: Diagnosis present

## 2023-03-05 DIAGNOSIS — I272 Pulmonary hypertension, unspecified: Secondary | ICD-10-CM | POA: Diagnosis present

## 2023-03-05 DIAGNOSIS — R4589 Other symptoms and signs involving emotional state: Secondary | ICD-10-CM | POA: Diagnosis not present

## 2023-03-05 DIAGNOSIS — R627 Adult failure to thrive: Secondary | ICD-10-CM | POA: Diagnosis present

## 2023-03-05 DIAGNOSIS — Z66 Do not resuscitate: Secondary | ICD-10-CM | POA: Diagnosis present

## 2023-03-05 LAB — COMPREHENSIVE METABOLIC PANEL
ALT: 11 U/L (ref 0–44)
AST: 17 U/L (ref 15–41)
Albumin: 2.5 g/dL — ABNORMAL LOW (ref 3.5–5.0)
Alkaline Phosphatase: 100 U/L (ref 38–126)
Anion gap: 7 (ref 5–15)
BUN: 6 mg/dL — ABNORMAL LOW (ref 8–23)
CO2: 30 mmol/L (ref 22–32)
Calcium: 7.9 mg/dL — ABNORMAL LOW (ref 8.9–10.3)
Chloride: 88 mmol/L — ABNORMAL LOW (ref 98–111)
Creatinine, Ser: <0.3 mg/dL — ABNORMAL LOW (ref 0.44–1.00)
Glucose, Bld: 102 mg/dL — ABNORMAL HIGH (ref 70–99)
Potassium: 3.5 mmol/L (ref 3.5–5.1)
Sodium: 125 mmol/L — ABNORMAL LOW (ref 135–145)
Total Bilirubin: 0.8 mg/dL (ref 0.3–1.2)
Total Protein: 6.3 g/dL — ABNORMAL LOW (ref 6.5–8.1)

## 2023-03-05 LAB — CBC
HCT: 34.3 % — ABNORMAL LOW (ref 36.0–46.0)
Hemoglobin: 11 g/dL — ABNORMAL LOW (ref 12.0–15.0)
MCH: 26.3 pg (ref 26.0–34.0)
MCHC: 32.1 g/dL (ref 30.0–36.0)
MCV: 81.9 fL (ref 80.0–100.0)
Platelets: 358 10*3/uL (ref 150–400)
RBC: 4.19 MIL/uL (ref 3.87–5.11)
RDW: 16.6 % — ABNORMAL HIGH (ref 11.5–15.5)
WBC: 9.7 10*3/uL (ref 4.0–10.5)
nRBC: 0 % (ref 0.0–0.2)

## 2023-03-05 LAB — GLUCOSE, CAPILLARY
Glucose-Capillary: 105 mg/dL — ABNORMAL HIGH (ref 70–99)
Glucose-Capillary: 107 mg/dL — ABNORMAL HIGH (ref 70–99)
Glucose-Capillary: 72 mg/dL (ref 70–99)
Glucose-Capillary: 86 mg/dL (ref 70–99)
Glucose-Capillary: 88 mg/dL (ref 70–99)

## 2023-03-05 MED ORDER — LORAZEPAM 2 MG/ML IJ SOLN
0.2500 mg | Freq: Once | INTRAMUSCULAR | Status: AC | PRN
  Administered 2023-03-05: 0.25 mg via INTRAVENOUS

## 2023-03-05 MED ORDER — KETOROLAC TROMETHAMINE 15 MG/ML IJ SOLN
15.0000 mg | Freq: Four times a day (QID) | INTRAMUSCULAR | Status: AC | PRN
  Administered 2023-03-05 – 2023-03-07 (×7): 15 mg via INTRAVENOUS
  Filled 2023-03-05 (×6): qty 1

## 2023-03-05 MED ORDER — FUROSEMIDE 10 MG/ML IJ SOLN
20.0000 mg | Freq: Once | INTRAMUSCULAR | Status: AC
  Administered 2023-03-05: 20 mg via INTRAVENOUS
  Filled 2023-03-05: qty 2
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
## 2023-03-05 NOTE — Progress Notes (Signed)
Initial Nutrition Assessment  DOCUMENTATION CODES:   Severe malnutrition in context of chronic illness  INTERVENTION:  - Advance diet as medically appropriate. SLP recommending MBS.   - Would recommend Ensure Plus High Protein po BID once diet advanced, each supplement provides 350 kcal and 20 grams of protein. - Would recommend Magic cup BID (any flavor) with meals once diet advanced, each supplement provides 290 kcal and 9 grams of protein  - Multivitamin with minerals daily  - Monitor weight trends.   NUTRITION DIAGNOSIS:   Severe Malnutrition related to chronic illness as evidenced by severe fat depletion, severe muscle depletion.  GOAL:   Patient will meet greater than or equal to 90% of their needs  MONITOR:   Diet advancement, Weight trends  REASON FOR ASSESSMENT:   Malnutrition Screening Tool    ASSESSMENT:   89 year old female with anxiety, depression, A-fib, CAD, prior CVA, chronic dysphagia comes into the hospital with SOB.  She was recently admitted 02/23/2023-03/03/2023 with multifocal pneumonia, presented 1 day after discharge.  Patient in bed at time of visit, two sons at bedside provided all nutrition history.   UBW reported to be around 87# and son notes patient has lost since December due to GI bleed and then PNA recently. Per EMR, weight without significant changes since December.   Patient normally eats 3 meals a day but son notes they are very small portions and she needs a lot of encouragement. They have started saying it is her job to try and eat more. Drinks Ensure and whey protein powder at home. Has had swallowing problems "her whole life".   Son reports she was recently admitted at [facility] and during that time was having trouble with swallowing and coughing up food after eating.  She is currently NPO. SLP saw patient today and noted she is not alert enough to consume PO, recommending MBS when able.  Once diet able to be advanced, son's  agreeable to patient receiving Ensure and Magic Cup if able.    Medications reviewed and include: 1000 units vitamin D, Magnesium, MVI, vitamin B12  Labs reviewed:  Na 125 Vitamin D 34.2 (4/25)  Vitamin B12 1031 (4/25) (H)   NUTRITION - FOCUSED PHYSICAL EXAM:  Flowsheet Row Most Recent Value  Orbital Region Severe depletion  Upper Arm Region Severe depletion  Thoracic and Lumbar Region Severe depletion  Buccal Region Severe depletion  Temple Region Severe depletion  Clavicle Bone Region Severe depletion  Clavicle and Acromion Bone Region Severe depletion  Scapular Bone Region Unable to assess  Dorsal Hand Severe depletion  Patellar Region Severe depletion  Anterior Thigh Region Severe depletion  Posterior Calf Region Severe depletion  Edema (RD Assessment) None  Hair Reviewed  Eyes Reviewed  Mouth Reviewed  Skin Reviewed  Nails Reviewed       Diet Order:   Diet Order             Diet NPO time specified  Diet effective now                   EDUCATION NEEDS:  Education needs have been addressed  Skin:  Skin Assessment: Skin Integrity Issues: Skin Integrity Issues:: Stage I Stage I: Sacrum  Last BM:  5/29  Height:  Ht Readings from Last 1 Encounters:  03/04/23 5\' 3"  (1.6 m)   Weight:  Wt Readings from Last 1 Encounters:  03/04/23 42 kg    BMI:  Body mass index is 16.4 kg/m.  Estimated Nutritional Needs:  Kcal:  1400-1500 kcals Protein:  65-85 grams Fluid:  >/= 1.4L    Shelle Iron RD, LDN For contact information, refer to [facility].

## 2023-03-05 NOTE — Plan of Care (Signed)
  Problem: Education: Goal: Knowledge of General Education information will improve Description: Including pain rating scale, medication(s)/side effects and non-pharmacologic comfort measures Outcome: Not Progressing   Problem: Health Behavior/Discharge Planning: Goal: Ability to manage health-related needs will improve Outcome: Not Progressing   Problem: Clinical Measurements: Goal: Ability to maintain clinical measurements within normal limits will improve Outcome: Not Progressing   Problem: Activity: Goal: Risk for activity intolerance will decrease Outcome: Not Progressing   Problem: Nutrition: Goal: Adequate nutrition will be maintained Outcome: Not Progressing   Problem: Pain Managment: Goal: General experience of comfort will improve Outcome: Not Progressing   

## 2023-03-05 NOTE — TOC Initial Note (Addendum)
Transition of Care [facility]) - Initial/Assessment Note    Patient Details  Name: Kaitlin [name] MRN: [id number] Date of Birth: [birth year]  Transition of [facility]) CM/SW Contact:    Howell Rucks, RN Phone Number: 03/05/2023, 9:52 AM  Clinical Narrative:  patient recent  discharge with readmission. PT eval pending, await recommendation.                       Patient Goals and CMS Choice            Expected Discharge Plan and Services                                              Prior Living Arrangements/Services                       Activities of Daily Living Home Assistive Devices/Equipment: Cane (specify quad or straight), Wheelchair, Environmental consultant (specify type) ADL Screening (condition at time of admission) Patient's cognitive ability adequate to safely complete daily activities?: No Is the patient deaf or have difficulty hearing?: No Does the patient have difficulty seeing, even when wearing glasses/contacts?: No Does the patient have difficulty concentrating, remembering, or making decisions?: Yes Patient able to express need for assistance with ADLs?: No Does the patient have difficulty dressing or bathing?: Yes Independently performs ADLs?: No Communication: Needs assistance Is this a change from baseline?: Pre-admission baseline Dressing (OT): Needs assistance Is this a change from baseline?: Pre-admission baseline Grooming: Needs assistance Is this a change from baseline?: Pre-admission baseline Feeding: Dependent Is this a change from baseline?: Pre-admission baseline Bathing: Dependent Is this a change from baseline?: Pre-admission baseline Toileting: Needs assistance Is this a change from baseline?: Pre-admission baseline In/Out Bed: Needs assistance Is this a change from baseline?: Pre-admission baseline Walks in Home: Needs assistance Is this a change from baseline?: Pre-admission baseline Does the patient have difficulty  walking or climbing stairs?: Yes Weakness of Legs: Both Weakness of Arms/Hands: Both  Permission Sought/Granted                  Emotional Assessment              Admission diagnosis:  Adult failure to thrive [R62.7] Hyponatremia [E87.1] Patient Active Problem List   Diagnosis Date Noted   Normocytic anemia 03/04/2023   Generalized weakness 03/04/2023   Aspiration into airway 03/04/2023   Pleural effusion 02/25/2023   Multilobar lung infiltrate 02/25/2023   Mass of right lung 02/25/2023   Hyponatremia 02/24/2023   Anxiety 02/24/2023   Coronary artery disease 02/24/2023   Acute respiratory failure with hypoxia (HCC) 02/24/2023   Community acquired pneumonia 02/24/2023   Sepsis due to pneumonia (HCC) 02/23/2023   Protein-calorie malnutrition, severe 02/07/2023   Sacral fracture, closed (HCC) 02/05/2023   Urinary tract infection without hematuria 10/29/2022   DNR (do not resuscitate) 09/27/2022   Acute pyonephrosis 09/26/2022   Goals of care, counseling/discussion 09/26/2022   Intractable low back pain 09/25/2022   Acute GI bleeding 09/19/2022   Multiple lung nodules on CT 09/19/2022   Acute blood loss anemia 09/19/2022   Chronic pain 09/19/2022   Underweight 09/19/2022   Dysphagia 10/18/2021   Lumbar stenosis with neurogenic claudication 10/29/2018   History of stroke 02/11/2018   Hyperlipidemia 08/13/2017   Fatigue 07/14/2017   Lymphadenopathy 07/14/2017  Fever and chills 07/14/2017   Left leg weakness 03/07/2017   Monoclonal paraproteinemia 03/07/2017   Persistent atrial fibrillation (HCC) 03/07/2017   Esophageal dysmotility 10/18/2014   Osteoarthritis    Tachy-brady syndrome (HCC)    Chest pain    NSTEMI (non-ST elevated myocardial infarction) (HCC) 01/25/2014   Essential hypertension, benign 08/11/2013   Bile duct stricture 06/02/2012   Sinus node dysfunction/post termination pauses    Chronic right lower quadrant pain 11/12/2011   COLONIC POLYPS,  HX OF 04/12/2010   ANXIETY DEPRESSION 02/07/2009   PERIPHERAL NEUROPATHY 02/07/2009   Mitral valve disorder 02/07/2009   OSTEOARTHRITIS 02/07/2009   Chronic back pain 02/07/2009   INSOMNIA 02/07/2009   GERD 06/23/2008   Irritable bowel syndrome 06/23/2008   Diverticulosis of colon with hemorrhage 12/07/2007   PCP:  Thana Ates, MD Pharmacy:   [id number] - [place], [place] - [address] [place] [address] Phone: [phone] Fax: [phone]  [id number] - , [place] - [address] AT CORNER OF [facility] [address] [place] [address] Phone: [phone] Fax: [phone]  [id number] - [place], [place] - [address] AT [facility] [address] [place] [address] Phone: [phone] Fax: [phone]     Social Determinants of Health (SDOH) Social History: SDOH Screenings   Food Insecurity: No Food Insecurity (03/04/2023)  Housing: Low Risk  (03/04/2023)  Transportation Needs: No Transportation Needs (03/04/2023)  Utilities: Not At Risk (03/04/2023)  Tobacco Use: Low Risk  (03/04/2023)   SDOH Interventions:     Readmission Risk Interventions    09/25/2022   10:02 AM  Readmission Risk Prevention Plan  Post Dischage Appt Complete  Medication Screening Complete  Transportation Screening Complete

## 2023-03-05 NOTE — Progress Notes (Signed)
PROGRESS NOTE  Angalina Roye WUJ:[id number] DOB: [birth year] DOA: 03/04/2023 PCP: Thana Ates, MD   LOS: 0 days   Brief Narrative / Interim history: 89 year old female with anxiety, depression, A-fib, CAD, prior CVA, chronic dysphagia comes into the hospital with shortness of breath.  She was recently admitted 02/23/2023-03/03/2023 with multifocal pneumonia, bilateral parapneumonic effusions status post completed course of antibiotics as well as 650 mL of thoracentesis, comes into the hospital 1 day after discharge with shortness of breath.  Her son also reports that she has had increased confusion at home, poor appetite, difficulty sleeping.  Chest x-ray on admission showed bilateral pleural effusions with bibasilar atelectasis versus edema  Subjective / 24h Interval events: Sleeping this morning, son is at bedside.  She was confused and agitated last night and received Ativan  Assesement and Plan: Principal Problem:   Hyponatremia Active Problems:   Persistent atrial fibrillation (HCC)   ANXIETY DEPRESSION   GERD   Essential hypertension, benign   Hyperlipidemia   Normocytic anemia   Generalized weakness   Aspiration into airway  Principal problem Generalized weakness in the setting of hyponatremia -on discharge sodium was 130, 125 yesterday on admission and again similar to this morning.  I feel like she has a degree of fluid overload with elevated BNP and bilateral pleural effusions.  Stop IV fluids.  Will see how she eats but may need Lasix later on  Active problems Concern for aspiration, esophageal strictures -seeing GI as an outpatient, most recent EGD with dilation was in January 2023.  Speech to see, she was on dysphagia 2 diet, will need at 45 degrees while eating  Pulmonary hypertension -2D echo done 5/21 showed LVEF 60-65%, RV was enlarged and PA pressure was 56 mmHg.  She has a small pericardial effusion, also severe tricuspid valve regurgitation.  Lasix  today  Persistent atrial fibrillation (HCC) - CHA2DS2-VASc Score of at least 5.  Continue Eliquis   Anxiety/depression- Has been having insomnia recently. On low-dose alprazolam as needed.   Recent multifocal pneumonia with bilateral parapneumonic effusions - pulmonary was consulted during her prior hospital stay for pleural effusion and thoracocentesis was done with removal of 650 mL of fluid.  Pleural fluid culture with no organisms, and pleural fluid cytology with no malignant cells.     Acute metabolic encephalopathy -likely in the setting of acute illness/rehospitalization.  She recently had an MRI of the brain on 02/24/2023 which was without acute findings   Previous NSTEMI 01/2014 with normal coronaries -no chest pain, stable   MGUS - Will need outpatient follow-up    RUL mass - Previously seen by Dr. Revonda Standard of pulmonology in the outpatient setting.  Family at that time was interested in active surveillance. Recent increase in size noted.  Recommend follow-up with pulmonary as outpatient.   Failure to thrive/debility, deconditioning - Decline since the last 6 months.  Patient appears to be  cachectic, debilitated and frail.  Prognosis is guarded long-term.    Scheduled Meds:  apixaban  2.5 mg Oral BID   calcium carbonate  1 tablet Oral BID   cholecalciferol  1,000 Units Oral Daily   guaiFENesin  600 mg Oral BID   ipratropium-albuterol  3 mL Nebulization BID   lidocaine  1 patch Transdermal Q24H   LORazepam  0.25 mg Intravenous Once   magnesium oxide  400 mg Oral QHS   mirtazapine  7.5 mg Oral QHS   multivitamin  15 mL Oral Daily   nystatin  5  mL Oral QID   vitamin B-12  100 mcg Oral Daily   Continuous Infusions: PRN Meds:.acetaminophen **OR** acetaminophen, albuterol, ALPRAZolam, methocarbamol, nitroGLYCERIN, ondansetron **OR** ondansetron (ZOFRAN) IV, oxyCODONE, polyethylene glycol, senna-docusate  Current Outpatient Medications  Medication Instructions   albuterol  (VENTOLIN HFA) 108 (90 Base) MCG/ACT inhaler 2 puffs, Inhalation, Every 6 hours PRN   ALPRAZolam (XANAX) 0.125 mg, Oral, 3 times daily PRN, For sleep   apixaban (ELIQUIS) 2.5 mg, Oral, 2 times daily   calcium carbonate (TUMS - DOSED IN MG ELEMENTAL CALCIUM) 500 MG chewable tablet 200 mg of elemental calcium, Oral, 2 times daily   cholecalciferol (VITAMIN D) 1,000 Units, Oral, Daily   Cyanocobalamin (VITAMIN B 12 PO) 1 tablet, Oral, Daily   guaiFENesin (MUCINEX) 600 mg, Oral, 2 times daily   MAGNESIUM PO 2 tablets, Oral, Daily at bedtime   methocarbamol (ROBAXIN) 500 mg, Oral, Every 6 hours PRN   Multiple Vitamin (MULTIVITAMIN) LIQD 15 mLs, Oral, Daily   nitroGLYCERIN (NITROSTAT) 0.4 mg, Every 5 min PRN   nystatin (MYCOSTATIN) 500,000 Units, Oral, 4 times daily   ondansetron (ZOFRAN) 4 mg, Oral, Daily PRN   oxyCODONE (OXY IR/ROXICODONE) 5 mg, Oral, 3 times daily   oxyCODONE (OXYCONTIN) 10 mg, Oral, Every 12 hours   polyethylene glycol powder (MIRALAX) 17 g, Oral, 2 times daily PRN   sennosides-docusate sodium (SENOKOT-S) 8.6-50 MG tablet 1-2 tablets, Oral, Daily PRN    Diet Orders (From admission, onward)     Start     Ordered   03/04/23 1515  Diet NPO time specified  Diet effective now        03/04/23 1514            DVT prophylaxis: apixaban (ELIQUIS) tablet 2.5 mg Start: 03/04/23 1500 apixaban (ELIQUIS) tablet 2.5 mg   Lab Results  Component Value Date   PLT 358 03/05/2023      Code Status: DNR  Family Communication: son at bedside   Status is: Observation The patient will require care spanning > 2 midnights and should be moved to inpatient because: Remains confused this morning, lethargic, hyponatremic  Level of care: Progressive  Consultants:  none  Objective: Vitals:   03/04/23 2129 03/04/23 2133 03/05/23 0135 03/05/23 0420  BP: (!) 166/64  (!) 154/118 (!) 165/82  Pulse: 65  70 69  Resp: 17  16 16   Temp: (!) 97.3 F (36.3 C)  (!) 97.5 F (36.4 C) 97.7  F (36.5 C)  TempSrc: Oral  Axillary Oral  SpO2: 99% 99% 92% 100%  Weight:      Height:        Intake/Output Summary (Last 24 hours) at 03/05/2023 0934 Last data filed at 03/05/2023 0456 Gross per 24 hour  Intake 993.43 ml  Output 850 ml  Net 143.43 ml   Wt Readings from Last 3 Encounters:  03/04/23 42 kg  02/23/23 39.9 kg  02/05/23 39.9 kg    Examination:  Constitutional: NAD Eyes: no scleral icterus ENMT: Mucous membranes are moist.  Neck: normal, supple Respiratory: Diminished at the bases, no wheezing Cardiovascular: Irregular a.  Abdomen: non distended, no tenderness. Bowel sounds positive.  Musculoskeletal: no clubbing / cyanosis.   Data Reviewed: I have independently reviewed following labs and imaging studies   CBC Recent Labs  Lab 02/27/23 0111 02/28/23 0754 03/02/23 0139 03/04/23 0837 03/05/23 0427  WBC 22.6* 17.9* 14.3* 12.4* 9.7  HGB 11.9* 11.2* 10.9* 11.6* 11.0*  HCT 38.4 36.9 35.5* 36.1 34.3*  PLT 312 299  287 356 358  MCV 85.0 87.2 84.5 82.6 81.9  MCH 26.3 26.5 26.0 26.5 26.3  MCHC 31.0 30.4 30.7 32.1 32.1  RDW 16.6* 16.8* 16.0* 16.2* 16.6*    Recent Labs  Lab 02/27/23 0111 02/28/23 0754 03/01/23 0131 03/02/23 0139 03/04/23 0837 03/05/23 0427  NA 134* 136 134* 130* 125* 125*  K 4.4 4.3 4.1 3.8 3.9 3.5  CL 95* 97* 97* 90* 86* 88*  CO2 29 30 31  34* 31 30  GLUCOSE 91 87 99 112* 105* 102*  BUN 12 10 9  6* 8 6*  CREATININE 0.48 0.42* 0.42* 0.36* 0.36* <0.30*  CALCIUM 8.5* 8.5* 8.5* 8.4* 8.0* 7.9*  AST  --   --   --   --  18 17  ALT  --   --   --   --  14 11  ALKPHOS  --   --   --   --  108 100  BILITOT  --   --   --   --  0.8 0.8  ALBUMIN  --   --   --   --  2.6* 2.5*  MG 1.8 1.8  --  1.8 1.8  --   BNP  --   --   --   --  321.7*  --     ------------------------------------------------------------------------------------------------------------------ No results for input(s): "CHOL", "HDL", "LDLCALC", "TRIG", "CHOLHDL", "LDLDIRECT" in  the last 72 hours.  Lab Results  Component Value Date   HGBA1C 5.0 03/07/2017   ------------------------------------------------------------------------------------------------------------------ No results for input(s): "TSH", "T4TOTAL", "T3FREE", "THYROIDAB" in the last 72 hours.  Invalid input(s): "FREET3"  Cardiac Enzymes No results for input(s): "CKMB", "TROPONINI", "MYOGLOBIN" in the last 168 hours.  Invalid input(s): "CK" ------------------------------------------------------------------------------------------------------------------    Component Value Date/Time   BNP 321.7 (H) 03/04/2023 0837    CBG: Recent Labs  Lab 03/04/23 1637 03/04/23 2356 03/05/23 0423  GLUCAP 114* 107* 105*    Recent Results (from the past 240 hour(s))  Resp panel by RT-PCR (RSV, Flu A&B, Covid) Anterior Nasal Swab     Status: None   Collection Time: 02/23/23 10:22 PM   Specimen: Anterior Nasal Swab  Result Value Ref Range Status   SARS Coronavirus 2 by RT PCR NEGATIVE NEGATIVE Final   Influenza A by PCR NEGATIVE NEGATIVE Final   Influenza B by PCR NEGATIVE NEGATIVE Final    Comment: (NOTE) The Xpert Xpress SARS-CoV-2/FLU/RSV plus assay is intended as an aid in the diagnosis of influenza from Nasopharyngeal swab specimens and should not be used as a sole basis for treatment. Nasal washings and aspirates are unacceptable for Xpert Xpress SARS-CoV-2/FLU/RSV testing.  Fact Sheet for Patients: BloggerCourse.com  Fact Sheet for Healthcare Providers: SeriousBroker.it  This test is not yet approved or cleared by the [place] FDA and has been authorized for detection and/or diagnosis of SARS-CoV-2 by FDA under an Emergency Use Authorization (EUA). This EUA will remain in effect (meaning this test can be used) for the duration of the COVID-19 declaration under Section 564(b)(1) of the Act, 21 U.S.C. section 360bbb-3(b)(1), unless the  authorization is terminated or revoked.     Resp Syncytial Virus by PCR NEGATIVE NEGATIVE Final    Comment: (NOTE) Fact Sheet for Patients: BloggerCourse.com  Fact Sheet for Healthcare Providers: SeriousBroker.it  This test is not yet approved or cleared by the [place] FDA and has been authorized for detection and/or diagnosis of SARS-CoV-2 by FDA under an Emergency Use Authorization (EUA). This EUA will remain in effect (  meaning this test can be used) for the duration of the COVID-19 declaration under Section 564(b)(1) of the Act, 21 U.S.C. section 360bbb-3(b)(1), unless the authorization is terminated or revoked.  Performed at [facility], [address] [place], [place] [address]   Blood culture (routine x 2)     Status: None   Collection Time: 02/23/23 11:30 PM   Specimen: BLOOD  Result Value Ref Range Status   Specimen Description BLOOD SITE NOT SPECIFIED  Final   Special Requests   Final    BOTTLES DRAWN AEROBIC AND ANAEROBIC Blood Culture results may not be optimal due to an excessive volume of blood received in culture bottles   Culture   Final    NO GROWTH 5 DAYS Performed at [facility], [address] [place], [place] [address]    Report Status 02/28/2023 FINAL  Final  Blood culture (routine x 2)     Status: None   Collection Time: 02/23/23 11:35 PM   Specimen: BLOOD LEFT FOREARM  Result Value Ref Range Status   Specimen Description BLOOD LEFT FOREARM  Final   Special Requests   Final    BOTTLES DRAWN AEROBIC AND ANAEROBIC Blood Culture results may not be optimal due to an excessive volume of blood received in culture bottles   Culture   Final    NO GROWTH 5 DAYS Performed at [facility], [address] [place], [place] [address]    Report Status 02/28/2023 FINAL  Final  Pleural fluid culture w Gram Stain     Status: None   Collection Time: 02/25/23  1:07 PM   Specimen: Pleural  Fluid  Result Value Ref Range Status   Specimen Description PLEURAL  Final   Special Requests RIGHT  Final   Gram Stain   Final    ABUNDANT WBC PRESENT,BOTH PMN AND MONONUCLEAR NO ORGANISMS SEEN    Culture   Final    NO GROWTH 3 DAYS Performed at [facility], [address], [place], [place] [address]    Report Status 02/28/2023 FINAL  Final     Radiology Studies: ECHOCARDIOGRAM COMPLETE  Result Date: 03/04/2023    ECHOCARDIOGRAM REPORT   Patient Name:   BILLIEJO HENRICH [facility] Date of Exam: 03/04/2023 Medical Rec #:  [id number]            Height:       63.0 in Accession #:    [id number]           Weight:       88.0 lb Date of Birth:  09-06-1927           BSA:          1.364 m Patient Age:    89 years             BP:           172/89 mmHg Patient Gender: F                    HR:           84 bpm. Exam Location:  Inpatient Procedure: 2D Echo, Cardiac Doppler and Color Doppler Indications:    Dyspnea R06.00  History:        Patient has prior history of Echocardiogram examinations, most                 recent 07/18/2021. Previous Myocardial Infarction,  Arrythmias:Atrial Fibrillation, Signs/Symptoms:Shortness of                 Breath; Risk Factors:Hypertension and Dyslipidemia.  Sonographer:    Aron Baba Referring Phys: [id number] DAVID MANUEL ORTIZ IMPRESSIONS  1. Left ventricular ejection fraction, by estimation, is 60 to 65%. The left ventricle has normal function. The left ventricle has no regional wall motion abnormalities. Left ventricular diastolic function could not be evaluated.  2. Right ventricular systolic function is mildly reduced. The right ventricular size is moderately enlarged. There is moderately elevated pulmonary artery systolic pressure. The estimated right ventricular systolic pressure is 56.0 mmHg.  3. Left atrial size was mildly dilated.  4. Right atrial size was severely dilated.  5. A small pericardial effusion is present. The pericardial effusion is  circumferential. There is no evidence of cardiac tamponade. Large pleural effusion in the left lateral region.  6. The mitral valve is normal in structure. Trivial mitral valve regurgitation.  7. 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  8. The aortic valve is tricuspid. Aortic valve regurgitation is trivial. No aortic stenosis is present.  9. The inferior vena cava is dilated in size with <50% respiratory variability, suggesting right atrial pressure of 15 mmHg. Comparison(s): No significant change from prior study. Prior images reviewed side by side. Changes from prior study are noted. There are new pericardial and pleural effusions and the inferior vena cava is plethoric consistent with acute exacerbation of right heart failure. FINDINGS  Left Ventricle: Left ventricular ejection fraction, by estimation, is 60 to 65%. The left ventricle has normal function. The left ventricle has no regional wall motion abnormalities. The left ventricular internal cavity size was normal in size. There is  no left ventricular hypertrophy. Left ventricular diastolic function could not be evaluated due to atrial fibrillation. Left ventricular diastolic function could not be evaluated. Right Ventricle: The right ventricular size is moderately enlarged. No increase in right ventricular wall thickness. Right ventricular systolic function is mildly reduced. There is moderately elevated pulmonary artery systolic pressure. The tricuspid regurgitant velocity is 3.20 m/s, and with an assumed right atrial pressure of 15 mmHg, the estimated right ventricular systolic pressure is 56.0 mmHg. Left Atrium: Left atrial size was mildly dilated. Right Atrium: Right atrial size was severely dilated. Pericardium: A small pericardial effusion is present. The pericardial effusion is circumferential. There is no evidence of cardiac tamponade. Mitral Valve: The mitral valve is normal in structure. Trivial mitral valve  regurgitation. 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 Aortic Valve: The aortic valve is tricuspid. Aortic valve regurgitation is trivial. Aortic regurgitation PHT measures 640 msec. No aortic stenosis is present. Pulmonic Valve: The pulmonic valve was grossly normal. Pulmonic valve regurgitation is mild. Aorta: The aortic root is normal in size and structure. Venous: The inferior vena cava is dilated in size with less than 50% respiratory variability, suggesting right atrial pressure of 15 mmHg. IAS/Shunts: No atrial level shunt detected by color flow Doppler. Additional Comments: There is a large pleural effusion in the left lateral region.  LEFT VENTRICLE PLAX 2D LVIDd:         3.70 cm   Diastology LVIDs:         2.50 cm   LV e' medial:    4.14 cm/s LV PW:         1.10 cm   LV E/e' medial:  27.8 LV IVS:        0.70  cm   LV e' lateral:   8.51 cm/s LVOT diam:     1.70 cm   LV E/e' lateral: 13.5 LV SV:         31 LV SV Index:   23 LVOT Area:     2.27 cm  RIGHT VENTRICLE RV S prime:     11.00 cm/s TAPSE (M-mode): 1.4 cm LEFT ATRIUM              Index         RIGHT ATRIUM           Index LA diam:        4.20 cm  3.08 cm/m    RA Area:     37.80 cm LA Vol (A2C):   155.0 ml 113.65 ml/m  RA Volume:   165.00 ml 120.98 ml/m LA Vol (A4C):   58.0 ml  42.53 ml/m LA Biplane Vol: 95.5 ml  70.02 ml/m  AORTIC VALVE             PULMONIC VALVE LVOT Vmax:   71.30 cm/s  PR End Diast Vel: 9.12 msec LVOT Vmean:  48.800 cm/s LVOT VTI:    0.137 m AI PHT:      640 msec  AORTA Ao Root diam: 3.00 cm Ao Asc diam:  3.30 cm MITRAL VALVE                TRICUSPID VALVE MV Area (PHT): 5.42 cm     TR Peak grad:   41.0 mmHg MV Decel Time: 140 msec     TR Vmax:        320.00 cm/s MV E velocity: 115.00 cm/s                             SHUNTS                             Systemic VTI:  0.14 m                             Systemic Diam: 1.70 cm Rachelle Hora Croitoru MD Electronically signed by Thurmon Fair MD Signature Date/Time: 03/04/2023/3:12:25 PM    Final      Pamella Pert, MD, PhD [facility]  Between 7 am - 7 pm I am available, please contact me via Amion (for emergencies) or Securechat (non urgent messages)  Between 7 pm - 7 am I am not available, please contact night coverage MD/APP via Amion

## 2023-03-05 NOTE — Evaluation (Addendum)
SLP Cancellation Note  Patient Details Name: Kaitlin [name] MRN: [id number] DOB: Mar 03, 1927   Cancelled treatment:       Reason Eval/Treat Not Completed: Other (comment) (RN reports pt is lethargic at this time, received medication for pain with side effect of sedation over night, requested RN contact this SLP when pt awakens adequately for eval. Thanks.)  [name], MS [facility] SLP Acute Rehab Services Office [phone]  [name] 03/05/2023, 8:05 AM

## 2023-03-06 DIAGNOSIS — R4589 Other symptoms and signs involving emotional state: Secondary | ICD-10-CM

## 2023-03-06 DIAGNOSIS — Z7189 Other specified counseling: Secondary | ICD-10-CM

## 2023-03-06 DIAGNOSIS — T17908S Unspecified foreign body in respiratory tract, part unspecified causing other injury, sequela: Secondary | ICD-10-CM | POA: Diagnosis not present

## 2023-03-06 DIAGNOSIS — Z515 Encounter for palliative care: Secondary | ICD-10-CM | POA: Diagnosis not present

## 2023-03-06 DIAGNOSIS — R627 Adult failure to thrive: Secondary | ICD-10-CM

## 2023-03-06 DIAGNOSIS — T17908A Unspecified foreign body in respiratory tract, part unspecified causing other injury, initial encounter: Secondary | ICD-10-CM

## 2023-03-06 DIAGNOSIS — E871 Hypo-osmolality and hyponatremia: Secondary | ICD-10-CM | POA: Diagnosis not present

## 2023-03-06 LAB — COMPREHENSIVE METABOLIC PANEL
ALT: 12 U/L (ref 0–44)
AST: 15 U/L (ref 15–41)
Albumin: 2.6 g/dL — ABNORMAL LOW (ref 3.5–5.0)
Alkaline Phosphatase: 94 U/L (ref 38–126)
Anion gap: 10 (ref 5–15)
BUN: 11 mg/dL (ref 8–23)
CO2: 29 mmol/L (ref 22–32)
Calcium: 8.1 mg/dL — ABNORMAL LOW (ref 8.9–10.3)
Chloride: 86 mmol/L — ABNORMAL LOW (ref 98–111)
Creatinine, Ser: 0.45 mg/dL (ref 0.44–1.00)
GFR, Estimated: >60 mL/min (ref >60)
Glucose, Bld: 77 mg/dL (ref 70–99)
Potassium: 3.8 mmol/L (ref 3.5–5.1)
Sodium: 125 mmol/L — ABNORMAL LOW (ref 135–145)
Total Bilirubin: 0.8 mg/dL (ref 0.3–1.2)
Total Protein: 6.1 g/dL — ABNORMAL LOW (ref 6.5–8.1)

## 2023-03-06 LAB — CBC
HCT: 36.4 % (ref 36.0–46.0)
Hemoglobin: 11.6 g/dL — ABNORMAL LOW (ref 12.0–15.0)
MCH: 26.5 pg (ref 26.0–34.0)
MCHC: 31.9 g/dL (ref 30.0–36.0)
MCV: 83.3 fL (ref 80.0–100.0)
Platelets: 409 10*3/uL — ABNORMAL HIGH (ref 150–400)
RBC: 4.37 MIL/uL (ref 3.87–5.11)
RDW: 16.7 % — ABNORMAL HIGH (ref 11.5–15.5)
WBC: 11.4 10*3/uL — ABNORMAL HIGH (ref 4.0–10.5)
nRBC: 0 % (ref 0.0–0.2)

## 2023-03-06 LAB — GLUCOSE, CAPILLARY
Glucose-Capillary: 73 mg/dL (ref 70–99)
Glucose-Capillary: 79 mg/dL (ref 70–99)
Glucose-Capillary: 115 mg/dL — ABNORMAL HIGH (ref 70–99)

## 2023-03-06 LAB — MAGNESIUM: Magnesium: 1.9 mg/dL (ref 1.7–2.4)

## 2023-03-06 MED ORDER — FUROSEMIDE 10 MG/ML IJ SOLN
20.0000 mg | Freq: Once | INTRAMUSCULAR | Status: AC
  Administered 2023-03-06: 20 mg via INTRAVENOUS
  Filled 2023-03-06: qty 2

## 2023-03-06 MED ORDER — POLYETHYLENE GLYCOL 3350 17 G PO PACK
8.5000 g | PACK | Freq: Every day | ORAL | Status: DC
  Administered 2023-03-07 – 2023-03-11 (×3): 8.5 g via ORAL
  Filled 2023-03-06 (×5): qty 1

## 2023-03-06 MED ORDER — BISACODYL 10 MG RE SUPP
10.0000 mg | Freq: Once | RECTAL | Status: AC
  Administered 2023-03-06: 10 mg via RECTAL
  Filled 2023-03-06: qty 1

## 2023-03-06 MED ORDER — SENNA 8.6 MG PO TABS
1.0000 | ORAL_TABLET | Freq: Every day | ORAL | Status: DC
  Administered 2023-03-07 – 2023-03-10 (×2): 8.6 mg via ORAL
  Filled 2023-03-06 (×5): qty 1

## 2023-03-06 MED ORDER — OXYCODONE HCL 5 MG/5ML PO SOLN
5.0000 mg | ORAL | Status: DC | PRN
  Administered 2023-03-06 – 2023-03-07 (×3): 5 mg via ORAL
  Filled 2023-03-06 (×3): qty 5

## 2023-03-06 MED ORDER — SODIUM CHLORIDE 1 G PO TABS
1.0000 g | ORAL_TABLET | Freq: Three times a day (TID) | ORAL | Status: DC
  Administered 2023-03-06 – 2023-03-10 (×5): 1 g via ORAL
  Filled 2023-03-06 (×8): qty 1
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
## 2023-03-06 NOTE — Progress Notes (Signed)
PT Cancellation Note  Patient Details Name: Kaitlin [name] MRN: [id number] DOB: Nov 10, 1926   Cancelled Treatment:    Reason Eval/Treat Not Completed: Pain limiting ability to participate RN recommended  to hold PT until Palliative meeting and after MBS.  [name] PT Acute Rehabilitation Services Office [phone] Weekend pager-[phone]  Rada Hay 03/06/2023, 10:15 AM

## 2023-03-06 NOTE — Progress Notes (Signed)
PROGRESS NOTE  Kaitlin [name] ZOX:[id number] DOB: [birth year] DOA: 03/04/2023 PCP: Thana Ates, MD   LOS: 1 day   Brief Narrative / Interim history: 89 year old female with anxiety, depression, A-fib, CAD, prior CVA, chronic dysphagia comes into the hospital with shortness of breath.  She was recently admitted 02/23/2023-03/03/2023 with multifocal pneumonia, bilateral parapneumonic effusions status post completed course of antibiotics as well as 650 mL of thoracentesis, comes into the hospital 1 day after discharge with shortness of breath.  Her son also reports that she has had increased confusion at home, poor appetite, difficulty sleeping.  Chest x-ray on admission showed bilateral pleural effusions with bibasilar atelectasis versus edema  Subjective / 24h Interval events: She is much more alert today.  Tells me she is hungry.  She states that her breathing is better  Assesement and Plan: Principal Problem:   Hyponatremia Active Problems:   Persistent atrial fibrillation (HCC)   ANXIETY DEPRESSION   GERD   Essential hypertension, benign   Hyperlipidemia   Normocytic anemia   Generalized weakness   Aspiration into airway  Principal problem Generalized weakness in the setting of hyponatremia -on discharge sodium was 130, has been 125 and stable since couple of days ago.  I feel like she has a degree of fluid overload with elevated BNP and bilateral pleural effusions.  Repeat Lasix, there is also component of poor solute intake.  Start sodium tabs  Active problems Concern for aspiration, esophageal strictures -seeing GI as an outpatient, most recent EGD with dilation was in January 2023.  Discussed with family at bedside, patient tells me she is hungry and wants to eat.  Explained risk of aspiration, patient and family understands, will allow her diet  Pulmonary hypertension -2D echo done 5/21 showed LVEF 60-65%, RV was enlarged and PA pressure was 56 mmHg.  She has a small  pericardial effusion, also severe tricuspid valve regurgitation.  Repeat Lasix  Persistent atrial fibrillation (HCC) - CHA2DS2-VASc Score of at least 5.  Continue Eliquis   Anxiety/depression- Has been having insomnia recently. On low-dose alprazolam as needed.   Recent multifocal pneumonia with bilateral parapneumonic effusions - pulmonary was consulted during her prior hospital stay for pleural effusion and thoracocentesis was done with removal of 650 mL of fluid.  Pleural fluid culture with no organisms, and pleural fluid cytology with no malignant cells.  Try Lasix   Acute metabolic encephalopathy -likely in the setting of acute illness/rehospitalization.  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  Family at that time was interested in active surveillance. Recent increase in size noted.  Recommend follow-up with pulmonary as outpatient.   Failure to thrive/debility, deconditioning - Decline since the last 6 months.  Patient appears to be  cachectic, debilitated and frail.  Prognosis is guarded long-term.    Scheduled Meds:  apixaban  2.5 mg Oral BID   bisacodyl  10 mg Rectal Once   calcium carbonate  1 tablet Oral BID   cholecalciferol  1,000 Units Oral Daily   furosemide  20 mg Intravenous Once   guaiFENesin  600 mg Oral BID   ipratropium-albuterol  3 mL Nebulization BID   lidocaine  1 patch Transdermal Q24H   LORazepam  0.25 mg Intravenous Once   magnesium oxide  400 mg Oral QHS   mirtazapine  7.5 mg Oral QHS   multivitamin  15 mL Oral Daily   nystatin  5 mL Oral QID   sodium chloride  1 g Oral TID WC   vitamin B-12  100 mcg Oral Daily   Continuous Infusions: PRN Meds:.acetaminophen **OR** acetaminophen, albuterol,  ALPRAZolam, ketorolac, methocarbamol, nitroGLYCERIN, ondansetron **OR** ondansetron (ZOFRAN) IV, oxyCODONE, polyethylene glycol, senna-docusate  Current Outpatient Medications  Medication Instructions   albuterol (VENTOLIN HFA) 108 (90 Base) MCG/ACT inhaler 2 puffs, Inhalation, Every 6 hours PRN   ALPRAZolam (XANAX) 0.125 mg, Oral, 3 times daily PRN, For sleep   apixaban (ELIQUIS) 2.5 mg, Oral, 2 times daily   calcium carbonate (TUMS - DOSED IN MG ELEMENTAL CALCIUM) 500 MG chewable tablet 200 mg of elemental calcium, Oral, 2 times daily   cholecalciferol (VITAMIN D) 1,000 Units, Oral, Daily   Cyanocobalamin (VITAMIN B 12 PO) 1 tablet, Oral, Daily   guaiFENesin (MUCINEX) 600 mg, Oral, 2 times daily   MAGNESIUM PO 2 tablets, Oral, Daily at bedtime   methocarbamol (ROBAXIN) 500 mg, Oral, Every 6 hours PRN   Multiple Vitamin (MULTIVITAMIN) LIQD 15 mLs, Oral, Daily   nitroGLYCERIN (NITROSTAT) 0.4 mg, Every 5 min PRN   nystatin (MYCOSTATIN) 500,000 Units, Oral, 4 times daily   ondansetron (ZOFRAN) 4 mg, Oral, Daily PRN   oxyCODONE (OXY IR/ROXICODONE) 5 mg, Oral, 3 times daily   oxyCODONE (OXYCONTIN) 10 mg, Oral, Every 12 hours   polyethylene glycol powder (MIRALAX) 17 g, Oral, 2 times daily PRN   sennosides-docusate sodium (SENOKOT-S) 8.6-50 MG tablet 1-2 tablets, Oral, Daily PRN    Diet Orders (From admission, onward)     Start     Ordered   03/06/23 1042  DIET DYS 3 Room service appropriate? Yes; Fluid consistency: Thin  Diet effective now       Question Answer Comment  Room service appropriate? Yes   Fluid consistency: Thin      03/06/23 1041            DVT prophylaxis: apixaban (ELIQUIS) tablet 2.5 mg Start: 03/04/23 1500 apixaban (ELIQUIS) tablet 2.5 mg   Lab Results  Component Value Date   PLT 409 (H) 03/06/2023      Code Status: DNR  Family Communication: son at bedside   Status is: Observation The patient will require care spanning > 2 midnights and should be  moved to inpatient because: Remains confused this morning, lethargic, hyponatremic  Level of care: Progressive  Consultants:  none  Objective: Vitals:   03/05/23 1929 03/05/23 2110 03/06/23 0518 03/06/23 0856  BP:  139/89 (!) 168/71   Pulse:  69 71   Resp:  16 20   Temp:  97.7 F (36.5 C) 97.8 F (36.6 C)   TempSrc:  Oral Oral   SpO2: 97% 99% 96% 95%  Weight:      Height:        Intake/Output Summary (Last 24 hours) at 03/06/2023 1041 Last data filed at 03/06/2023 0754 Gross per 24 hour  Intake 0 ml  Output 1800 ml  Net -1800 ml    Wt Readings from Last 3 Encounters:  03/04/23 42 kg  02/23/23 39.9 kg  02/05/23 39.9 kg    Examination:  Constitutional: NAD Eyes: lids and conjunctivae normal, no scleral icterus ENMT: mmm Neck: normal, supple Respiratory: clear to auscultation bilaterally, no wheezing, no crackles. Normal respiratory effort.  Cardiovascular: Regular rate and  rhythm, no murmurs / rubs / gallops. No LE edema. Abdomen: soft, no distention, no tenderness. Bowel sounds positive.  Skin: no rashes  Data Reviewed: I have independently reviewed following labs and imaging studies   CBC Recent Labs  Lab 02/28/23 0754 03/02/23 0139 03/04/23 0837 03/05/23 0427 03/06/23 0352  WBC 17.9* 14.3* 12.4* 9.7 11.4*  HGB 11.2* 10.9* 11.6* 11.0* 11.6*  HCT 36.9 35.5* 36.1 34.3* 36.4  PLT 299 287 356 358 409*  MCV 87.2 84.5 82.6 81.9 83.3  MCH 26.5 26.0 26.5 26.3 26.5  MCHC 30.4 30.7 32.1 32.1 31.9  RDW 16.8* 16.0* 16.2* 16.6* 16.7*     Recent Labs  Lab 02/28/23 0754 03/01/23 0131 03/02/23 0139 03/04/23 0837 03/05/23 0427 03/06/23 0352  NA 136 134* 130* 125* 125* 125*  K 4.3 4.1 3.8 3.9 3.5 3.8  CL 97* 97* 90* 86* 88* 86*  CO2 30 31 34* 31 30 29   GLUCOSE 87 99 112* 105* 102* 77  BUN 10 9 6* 8 6* 11  CREATININE 0.42* 0.42* 0.36* 0.36* <0.30* 0.45  CALCIUM 8.5* 8.5* 8.4* 8.0* 7.9* 8.1*  AST  --   --   --  18 17 15   ALT  --   --   --  14 11 12    ALKPHOS  --   --   --  108 100 94  BILITOT  --   --   --  0.8 0.8 0.8  ALBUMIN  --   --   --  2.6* 2.5* 2.6*  MG 1.8  --  1.8 1.8  --  1.9  BNP  --   --   --  321.7*  --   --      ------------------------------------------------------------------------------------------------------------------ No results for input(s): "CHOL", "HDL", "LDLCALC", "TRIG", "CHOLHDL", "LDLDIRECT" in the last 72 hours.  Lab Results  Component Value Date   HGBA1C 5.0 03/07/2017   ------------------------------------------------------------------------------------------------------------------ No results for input(s): "TSH", "T4TOTAL", "T3FREE", "THYROIDAB" in the last 72 hours.  Invalid input(s): "FREET3"  Cardiac Enzymes No results for input(s): "CKMB", "TROPONINI", "MYOGLOBIN" in the last 168 hours.  Invalid input(s): "CK" ------------------------------------------------------------------------------------------------------------------    Component Value Date/Time   BNP 321.7 (H) 03/04/2023 0837    CBG: Recent Labs  Lab 03/05/23 0423 03/05/23 1232 03/05/23 1756 03/05/23 2358 03/06/23 0529  GLUCAP 105* 86 88 72 73     Recent Results (from the past 240 hour(s))  Pleural fluid culture w Gram Stain     Status: None   Collection Time: 02/25/23  1:07 PM   Specimen: Pleural Fluid  Result Value Ref Range Status   Specimen Description PLEURAL  Final   Special Requests RIGHT  Final   Gram Stain   Final    ABUNDANT WBC PRESENT,BOTH PMN AND MONONUCLEAR NO ORGANISMS SEEN    Culture   Final    NO GROWTH 3 DAYS Performed at [facility], [address] [place], [place] [address]    Report Status 02/28/2023 FINAL  Final     Radiology Studies: No results found.   Pamella Pert, MD, PhD [facility]  Between 7 am - 7 pm I am available, please contact me via Amion (for emergencies) or Securechat (non urgent messages)  Between 7 pm - 7 am I am not available, please contact  night coverage MD/APP via Amion

## 2023-03-06 NOTE — Consult Note (Signed)
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 by side. Changes from prior study are noted. There are new pericardial and pleural effusions and the inferior vena cava is plethoric consistent with acute exacerbation of  right heart failure.  FINDINGS  Left Ventricle: Left ventricular ejection fraction, by estimation, is 60 to 65%. The left ventricle has normal function. The left ventricle has no regional wall motion abnormalities. The left ventricular internal cavity size was normal in size. There is  no left ventricular hypertrophy. Left ventricular diastolic function could not be evaluated due to atrial fibrillation. Left ventricular diastolic function could not be evaluated.  Right Ventricle: The right ventricular size is moderately enlarged. No increase in right ventricular wall thickness. Right ventricular systolic function is mildly reduced. There is moderately elevated pulmonary artery systolic pressure. The tricuspid  regurgitant velocity is 3.20 m/s, and with an assumed right atrial pressure of 15 mmHg, the estimated right ventricular systolic pressure is 56.0 mmHg.  Left Atrium: Left atrial size was mildly dilated.  Right Atrium: Right atrial size was severely dilated.  Pericardium: A small pericardial effusion is present. The pericardial effusion is circumferential. There is no evidence of cardiac tamponade.  Mitral Valve: The mitral valve is normal in structure. Trivial mitral valve  regurgitation.  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  Aortic Valve: The aortic valve is tricuspid. Aortic valve regurgitation is trivial. Aortic regurgitation PHT measures 640 msec. No aortic stenosis is present.  Pulmonic Valve: The pulmonic valve was grossly normal. Pulmonic valve regurgitation is mild.  Aorta: The aortic root is normal in size and structure.  Venous: The inferior vena cava is dilated in size with less than 50% respiratory variability, suggesting right atrial pressure of 15 mmHg.  IAS/Shunts: No atrial level shunt detected by color flow Doppler.  Additional Comments: There is a large pleural effusion in the left lateral region.    LEFT VENTRICLE PLAX 2D LVIDd:         3.70 cm   Diastology LVIDs:         2.50 cm   LV e' medial:    4.14 cm/s LV PW:         1.10 cm   LV E/e' medial:  27.8 LV IVS:        0.70 cm   LV e' lateral:   8.51 cm/s LVOT diam:     1.70 cm   LV E/e' lateral: 13.5 LV SV:         31 LV SV Index:   23 LVOT Area:     2.27 cm    RIGHT VENTRICLE RV S prime:  11.00 cm/s TAPSE (M-mode): 1.4 cm  LEFT ATRIUM              Index         RIGHT ATRIUM           Index LA diam:        4.20 cm  3.08 cm/m    RA Area:     37.80 cm LA Vol (A2C):   155.0 ml 113.65 ml/m  RA Volume:   165.00 ml 120.98 ml/m LA Vol (A4C):   58.0 ml  42.53 ml/m LA Biplane Vol: 95.5 ml  70.02 ml/m  AORTIC VALVE             PULMONIC VALVE LVOT Vmax:   71.30 cm/s  PR End Diast Vel: 9.12 msec LVOT Vmean:  48.800 cm/s LVOT VTI:    0.137 m AI PHT:      640 msec   AORTA Ao Root diam: 3.00 cm Ao Asc diam:  3.30 cm  MITRAL VALVE                TRICUSPID VALVE MV Area (PHT): 5.42 cm     TR Peak grad:   41.0 mmHg MV Decel Time: 140 msec     TR Vmax:        320.00 cm/s MV E velocity: 115.00 cm/s                             SHUNTS                             Systemic VTI:  0.14 m                              Systemic Diam: 1.70 cm  Rachelle Hora Croitoru MD Electronically signed by Thurmon Fair MD Signature Date/Time: 03/04/2023/3:12:25 PM      Final   DG Chest Portable 1 View CLINICAL DATA:  Shortness of breath.  EXAM: PORTABLE CHEST 1 VIEW  COMPARISON:  Feb 25, 2023.  FINDINGS: Stable cardiomediastinal silhouette. Bilateral pleural effusions are now noted with associated bibasilar atelectasis or edema. Bony thorax is unremarkable.  IMPRESSION: Bilateral pleural effusions are now noted with associated bibasilar atelectasis or edema.  Electronically Signed   By: Lupita Raider M.D.   On: 03/04/2023 08:48    I personally reviewed recent imaging.   Palliative Care Assessment and Plan Summary of Established Goals of Care and Medical Treatment Preferences   Patient is a 89 year old female with past medical history of anxiety, depression, A-fib, CAD, prior CVA, recent fall causing coccyx fracture with decreased mobilization afterwards, and chronic dysphagia who was admitted on 03/04/2023 for management of shortness of breath.  Patient had recently been admitted at [facility] from 5/12 - 5/20 for management of multifocal pneumonia, bilateral parapneumonic effusions and is status post complicated course of antibiotics as well as 650 mL thoracentesis.  Patient presented to hospital 1 day after discharge with worsening shortness of breath.  During hospitalization, patient found to have failure to thrive due to her to her chronic dysphagia.  Patient has multifocal pneumonia with parapneumonic infusions in setting of chronic dysphagia.  Patient has undergone workup and evaluation by SLP and her underlying cause of dysphagia cannot be medically corrected.  Palliative medicine team consulted to assist with complex medical decision making.  #  Complex medical decision making/goals of care  -Extremely complicated family dynamics described above in HPI.  During visit at this time, patient was clearly  able to speak for herself and answer her own medical questions regarding care moving forward.  Respected patient's wishes for care and discussion.  Explained in detail complications from patient's chronic aspiration.  There is no medical way to "fix" patient's condition.  Feeding tubes would only lead to further infection risks and for patient to aspirate on tube feeds.  Patient appropriately grieving her loss of independence and changes in her life regarding this.  Acknowledged and offered emotional support via active listening.  -Discussed with patient and family present, normalities regarding possible pathways for medical care moving forward.  Discussed pathway of continuing with aggressive care and layering palliative care for symptom management versus focusing on quality time at home with potential home hospice involvement.  Patient herself acknowledged these possible pathways for medical care after giving permission to speak about them.  Patient wants to further discuss her medical care moving forward with her children so they can all come up with a plan together.  Patient also acknowledges that her children are being "torn apart" by the difficulties surrounding her health.  Empathized with the situation and acknowledged this is because they love her very much and only want the best for her.  -Patient does have ACP documentation on file naming patient's son, Kaitlin [name], as her primary HCPOA.  Again these documents do not come in to affect until patient either defers decision making to him or does not have the medical capacity to discuss complex medical decision making.  -Family members reporting concerns about verbal and/or physical violence between each other.  Noted that should this occur in the hospital, security should be called.  Should there be concerns about patient's safety once she is discharged to what ever home she is going to, Adult Protective Services can be called.  -  Code Status: DNR   #  Symptom management  -Pain/Dyspnea, acute on chronic in the setting of dyspnea from chronic aspiration leading to pneumonia as well as recent fall causing coccyx fracture and essentially bedbound status Patient notes previously receiving oxycodone and hydrocodone without difficulties.  Noted would start oxycodone 5 mg as per her request for management of pain at this time.  Discussed continued adjustments based on symptom burden.   -Start oxycodone solution oral 5mg  q4hrs prn to assist with pain and dyspnea management.  Patient notes that opioids have previously helped with dyspnea as well.Explained would use solution as to prevent difficulties with taking pills and patient agreed.      -Constipation Patient notes she has not had a bowel movement since last Friday, 5/17.  Discussed importance of regular bowel movements to prevent worsening pain and confusion from constipation.   -Give suppository once today. Patient agreeing with this.   -Give senna 1 tab and Miralax daily.    -Anxiety   -Currently receiving Xanax 0.125 3 times daily as needed.  Discussed with patient that this likely needs further adjustment as her anxiety is not appropriately managed.  Patient agreeing with this.  Noted would only adjust a few medications at that time so would currently focus on pain and constipation and then could adjust anxiety medication which patient was agreeing with.  # Psycho-social/Spiritual Support:  - Support System: children, grandson -As per family report, Kaitlin [name] refused chaplain coming to visit to assist in mediation of discussion.   # Discharge Planning:  TBD. Messaged TOC for assistance.   Thank you for allowing the palliative care team to participate in the care [facility].  Alvester Morin, DO Palliative Care Provider PMT # [phone]  If patient remains symptomatic despite maximum doses, please call PMT at [phone] between 0700 and 1900. Outside of these hours, please call  attending, as PMT does not have night coverage.  This provider spent a total of 125 minutes providing patient's care.  Includes review of EMR, discussing care with other staff members involved in patient's medical care, obtaining relevant history and information from patient and/or patient's family, and personal review of imaging and lab work. Greater than 50% of the time was spent counseling and coordinating care related to the above assessment and plan.    *Please note that this is a verbal dictation therefore any spelling or grammatical errors are due to the "[employer] One" system interpretation.

## 2023-03-06 NOTE — Progress Notes (Signed)
MBS to be completed today as per orders.    Kaitlin Infante, MS [facility] SLP Acute [employer] [phone]

## 2023-03-06 NOTE — TOC Progression Note (Signed)
Transition of Care [facility]) - Progression Note    Patient Details  Name: Saidie Layer MRN: [id number] Date of Birth: 1927/07/02  Transition of Care [facility]) CM/SW Contact  Howell Rucks, RN Phone Number: 03/06/2023, 12:54 PM  Clinical Narrative:  Met with pt and son Kathlene November) at bedside to introduce role of TOC/NCM and review for dc needs. Pt voiced she is not sure of her dc plan and request her son, Kathlene November, step into the room, per Kathlene November, [employer] plans are uncertain depending on pt's progress, discussed his feelings regarding Palliative Care consult- Kathlene November did not voice agreement or disagreement, states at this time they are uncertain of pt's dc plan.  TOC will continue to follow.         Expected Discharge Plan and Services                                               Social Determinants of Health (SDOH) Interventions SDOH Screenings   Food Insecurity: No Food Insecurity (03/04/2023)  Housing: Low Risk  (03/04/2023)  Transportation Needs: No Transportation Needs (03/04/2023)  Utilities: Not At Risk (03/04/2023)  Tobacco Use: Low Risk  (03/04/2023)    Readmission Risk Interventions    09/25/2022   10:02 AM  Readmission Risk Prevention Plan  Post Dischage Appt Complete  Medication Screening Complete  Transportation Screening Complete

## 2023-03-06 NOTE — Progress Notes (Signed)
MBS order cancelled, comfort feeding initiated, Thank you.    Kaitlin Infante, MS [facility] SLP Acute [employer] [phone]

## 2023-03-06 NOTE — Plan of Care (Signed)
  Problem: Education: Goal: Knowledge of General Education information will improve Description: Including pain rating scale, medication(s)/side effects and non-pharmacologic comfort measures Outcome: Progressing   Problem: Health Behavior/Discharge Planning: Goal: Ability to manage health-related needs will improve Outcome: Progressing   Problem: Clinical Measurements: Goal: Ability to maintain clinical measurements within normal limits will improve Outcome: Progressing   Problem: Activity: Goal: Risk for activity intolerance will decrease Outcome: Not Progressing   Problem: Nutrition: Goal: Adequate nutrition will be maintained Outcome: Not Progressing   Problem: Coping: Goal: Level of anxiety will decrease Outcome: Not Progressing

## 2023-03-07 DIAGNOSIS — E871 Hypo-osmolality and hyponatremia: Secondary | ICD-10-CM | POA: Diagnosis not present

## 2023-03-07 LAB — CBC
HCT: 39 % (ref 36.0–46.0)
Hemoglobin: 12.7 g/dL (ref 12.0–15.0)
MCH: 27 pg (ref 26.0–34.0)
MCHC: 32.6 g/dL (ref 30.0–36.0)
MCV: 82.8 fL (ref 80.0–100.0)
Platelets: 423 10*3/uL — ABNORMAL HIGH (ref 150–400)
RBC: 4.71 MIL/uL (ref 3.87–5.11)
RDW: 17.1 % — ABNORMAL HIGH (ref 11.5–15.5)
WBC: 16.5 10*3/uL — ABNORMAL HIGH (ref 4.0–10.5)
nRBC: 0 % (ref 0.0–0.2)

## 2023-03-07 LAB — BASIC METABOLIC PANEL
Anion gap: 9 (ref 5–15)
BUN: 18 mg/dL (ref 8–23)
CO2: 33 mmol/L — ABNORMAL HIGH (ref 22–32)
Calcium: 8.2 mg/dL — ABNORMAL LOW (ref 8.9–10.3)
Chloride: 83 mmol/L — ABNORMAL LOW (ref 98–111)
Creatinine, Ser: 0.47 mg/dL (ref 0.44–1.00)
GFR, Estimated: >60 mL/min (ref >60)
Glucose, Bld: 92 mg/dL (ref 70–99)
Potassium: 3.3 mmol/L — ABNORMAL LOW (ref 3.5–5.1)
Sodium: 125 mmol/L — ABNORMAL LOW (ref 135–145)

## 2023-03-07 LAB — MAGNESIUM: Magnesium: 1.9 mg/dL (ref 1.7–2.4)

## 2023-03-07 LAB — GLUCOSE, CAPILLARY
Glucose-Capillary: 108 mg/dL — ABNORMAL HIGH (ref 70–99)
Glucose-Capillary: 135 mg/dL — ABNORMAL HIGH (ref 70–99)
Glucose-Capillary: 136 mg/dL — ABNORMAL HIGH (ref 70–99)
Glucose-Capillary: 81 mg/dL (ref 70–99)

## 2023-03-07 MED ORDER — POTASSIUM CHLORIDE CRYS ER 20 MEQ PO TBCR
40.0000 meq | EXTENDED_RELEASE_TABLET | Freq: Once | ORAL | Status: AC
  Administered 2023-03-07: 40 meq via ORAL
  Filled 2023-03-07: qty 2

## 2023-03-07 MED ORDER — SODIUM CHLORIDE 0.9 % IV SOLN
1.5000 g | Freq: Two times a day (BID) | INTRAVENOUS | Status: DC
  Administered 2023-03-07 – 2023-03-09 (×5): 1.5 g via INTRAVENOUS
  Filled 2023-03-07 (×8): qty 4

## 2023-03-07 MED ORDER — OXYCODONE HCL 5 MG/5ML PO SOLN
5.0000 mg | ORAL | Status: DC | PRN
  Administered 2023-03-07: 5 mg via ORAL
  Filled 2023-03-07 (×2): qty 5

## 2023-03-07 MED ORDER — KETOROLAC TROMETHAMINE 15 MG/ML IJ SOLN
15.0000 mg | Freq: Three times a day (TID) | INTRAMUSCULAR | Status: AC
  Administered 2023-03-07 – 2023-03-08 (×4): 15 mg via INTRAVENOUS
  Filled 2023-03-07 (×5): qty 1

## 2023-03-07 MED ORDER — OXYCODONE HCL 5 MG/5ML PO SOLN
5.0000 mg | ORAL | Status: DC | PRN
  Administered 2023-03-08 – 2023-03-11 (×8): 5 mg via ORAL
  Filled 2023-03-07 (×8): qty 5

## 2023-03-07 NOTE — Progress Notes (Signed)
PROGRESS NOTE  Kaitlin [name] BJY:[id number] DOB: [birth year] DOA: 03/11/2023 PCP: Thana Ates, MD   LOS: 2 days   Brief Narrative / Interim history: 89 year old female with anxiety, depression, A-fib, CAD, prior CVA, chronic dysphagia comes into the hospital with shortness of breath.  She was recently admitted 02/23/2023-03/03/2023 with multifocal pneumonia, bilateral parapneumonic effusions status post completed course of antibiotics as well as 650 mL of thoracentesis, comes into the hospital 1 day after discharge with shortness of breath.  Her son also reports that she has had increased confusion at home, poor appetite, difficulty sleeping.  Chest x-ray on admission showed bilateral pleural effusions with bibasilar atelectasis versus edema  Subjective / 24h Interval events: She is feeling quite poorly today.  Complains of nausea.  Assesement and Plan: Principal Problem:   Hyponatremia Active Problems:   Persistent atrial fibrillation (HCC)   ANXIETY DEPRESSION   GERD   Essential hypertension, benign   Hyperlipidemia   Normocytic anemia   Generalized weakness   Aspiration into airway   Need for emotional support   Adult failure to thrive   Chronic pulmonary aspiration   Counseling and coordination of care   Palliative care encounter  Principal problem Generalized weakness in the setting of hyponatremia -on discharge sodium was 130, has been 125 and stable since couple of days ago.  I feel like she had a degree of fluid overload on admission with elevated BNP and bilateral pleural effusions.  She received Lasix x 2, currently appears on the dry side, no further diuresis.  Watch sodium.  Placed on salt tabs  Active problems Concern for aspiration, esophageal strictures -seeing GI as an outpatient, most recent EGD with dilation was in January 2023.  Discussed with family at bedside, patient tells me she is hungry and wants to eat.  Explained risk of aspiration, patient and  family understands, will allow her diet  Aspiration pneumonia -feeling sicker today, increased white count, definitely aspirating.  Will do a short course of Unasyn.  Pulmonary hypertension -2D echo done 5/21 showed LVEF 60-65%, RV was enlarged and PA pressure was 56 mmHg.  She has a small pericardial effusion, also severe tricuspid valve regurgitation.  Received Lasix x 2, currently hold due to being intravascularly dry  Persistent atrial fibrillation (HCC) - CHA2DS2-VASc Score of at least 5.  Continue Eliquis   Anxiety/depression- Has been having insomnia recently. On low-dose alprazolam as needed.   Recent multifocal pneumonia with bilateral parapneumonic effusions - pulmonary was consulted during her prior hospital stay for pleural effusion and thoracocentesis was done with removal of 650 mL of fluid.  Pleural fluid culture with no organisms, and pleural fluid cytology with no malignant cells.  Try Lasix   Acute metabolic encephalopathy -likely in the setting of acute illness/rehospitalization.  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  Family at that time was interested in active surveillance. Recent increase in size noted.  Recommend follow-up with pulmonary as outpatient.   Failure to thrive/debility, deconditioning - Decline since the last 6 months.  Patient appears to be  cachectic, debilitated and frail.  Prognosis is guarded long-term.    Scheduled Meds:  apixaban  2.5 mg Oral BID   calcium carbonate  1 tablet Oral BID   cholecalciferol  1,000 Units Oral Daily   guaiFENesin  600 mg Oral BID   ipratropium-albuterol  3 mL Nebulization BID   lidocaine  1 patch Transdermal Q24H   LORazepam  0.25 mg Intravenous Once    magnesium oxide  400 mg Oral QHS   mirtazapine  7.5 mg Oral QHS   multivitamin  15 mL Oral Daily   nystatin  5 mL Oral QID   polyethylene glycol  8.5 g Oral Daily   senna  1 tablet Oral Daily   sodium chloride  1 g Oral TID WC   vitamin B-12  100 mcg Oral Daily   Continuous Infusions: PRN Meds:.acetaminophen **OR** acetaminophen, albuterol, ALPRAZolam, ketorolac, methocarbamol, nitroGLYCERIN, ondansetron **OR** ondansetron (ZOFRAN) IV, oxyCODONE  Current Outpatient Medications  Medication Instructions   albuterol (VENTOLIN HFA) 108 (90 Base) MCG/ACT inhaler 2 puffs, Inhalation, Every 6 hours PRN   ALPRAZolam (XANAX) 0.125 mg, Oral, 3 times daily PRN, For sleep   apixaban (ELIQUIS) 2.5 mg, Oral, 2 times daily   calcium carbonate (TUMS - DOSED IN MG ELEMENTAL CALCIUM) 500 MG chewable tablet 200 mg of elemental calcium, Oral, 2 times daily   cholecalciferol (VITAMIN D) 1,000 Units, Oral, Daily   Cyanocobalamin (VITAMIN B 12 PO) 1 tablet, Oral, Daily   guaiFENesin (MUCINEX) 600 mg, Oral, 2 times daily   MAGNESIUM PO 2 tablets, Oral, Daily at bedtime   methocarbamol (ROBAXIN) 500 mg, Oral, Every 6 hours PRN   Multiple Vitamin (MULTIVITAMIN) LIQD 15 mLs, Oral, Daily   nitroGLYCERIN (NITROSTAT) 0.4 mg, Every 5 min PRN   nystatin (MYCOSTATIN) 500,000 Units, Oral, 4 times daily   ondansetron (ZOFRAN) 4 mg, Oral, Daily PRN   oxyCODONE (OXY IR/ROXICODONE) 5 mg, Oral, 3 times daily   oxyCODONE (OXYCONTIN) 10 mg, Oral, Every 12 hours   polyethylene glycol powder (MIRALAX) 17 g, Oral, 2 times daily PRN   sennosides-docusate sodium (SENOKOT-S) 8.6-50 MG tablet 1-2 tablets, Oral, Daily PRN    Diet Orders (From admission, onward)     Start     Ordered   03/06/23 1042  DIET DYS 3 Room service appropriate? Yes; Fluid consistency: Thin  Diet effective now       Question Answer Comment  Room service appropriate? Yes   Fluid consistency: Thin      03/06/23 1041            DVT prophylaxis:  apixaban (ELIQUIS) tablet 2.5 mg Start: 02/27/2023 1500 apixaban (ELIQUIS) tablet 2.5 mg   Lab Results  Component Value Date   PLT 423 (H) 03/07/2023      Code Status: DNR  Family Communication: son at bedside   Status is: inpatient   Level of care: Progressive  Consultants:  none  Objective: Vitals:   03/06/23 1254 03/06/23 2126 03/07/23 0600 03/07/23 0924  BP: (!) 154/74 (!) 128/52 125/60   Pulse: 68 (!) 51 64   Resp: (!) 24 (!) 24 (!) 22   Temp: 97.7 F (36.5 C) (!) 97.4 F (36.3 C)    TempSrc: Oral Oral    SpO2: 99% 95% 97% 92%  Weight:      Height:        Intake/Output Summary (Last 24 hours) at 03/07/2023 1205 Last data filed at 03/07/2023 0600 Gross per 24 hour  Intake 30 ml  Output 50 ml  Net -20 ml    Wt Readings from Last 3 Encounters:  02/12/2023 42 kg  02/23/23 39.9 kg  02/05/23  39.9 kg    Examination:  Constitutional: NAD frail-appearing Eyes: lids and conjunctivae normal, no scleral icterus ENMT: mmm Neck: normal, supple Respiratory: clear to auscultation bilaterally, no wheezing, no crackles. Normal respiratory effort.  Cardiovascular: Regular rate and rhythm, no murmurs / rubs / gallops. No LE edema. Abdomen: soft, no distention, no tenderness. Bowel sounds positive.   Data Reviewed: I have independently reviewed following labs and imaging studies   CBC Recent Labs  Lab 03/02/23 0139 02/26/2023 0837 03/05/23 0427 03/06/23 0352 03/07/23 0419  WBC 14.3* 12.4* 9.7 11.4* 16.5*  HGB 10.9* 11.6* 11.0* 11.6* 12.7  HCT 35.5* 36.1 34.3* 36.4 39.0  PLT 287 356 358 409* 423*  MCV 84.5 82.6 81.9 83.3 82.8  MCH 26.0 26.5 26.3 26.5 27.0  MCHC 30.7 32.1 32.1 31.9 32.6  RDW 16.0* 16.2* 16.6* 16.7* 17.1*     Recent Labs  Lab 03/02/23 0139 02/24/2023 0837 03/05/23 0427 03/06/23 0352 03/07/23 0419  NA 130* 125* 125* 125* 125*  K 3.8 3.9 3.5 3.8 3.3*  CL 90* 86* 88* 86* 83*  CO2 34* 31 30 29  33*  GLUCOSE 112* 105* 102* 77 92  BUN 6* 8 6* 11  18  CREATININE 0.36* 0.36* <0.30* 0.45 0.47  CALCIUM 8.4* 8.0* 7.9* 8.1* 8.2*  AST  --  18 17 15   --   ALT  --  14 11 12   --   ALKPHOS  --  108 100 94  --   BILITOT  --  0.8 0.8 0.8  --   ALBUMIN  --  2.6* 2.5* 2.6*  --   MG 1.8 1.8  --  1.9 1.9  BNP  --  321.7*  --   --   --      ------------------------------------------------------------------------------------------------------------------ No results for input(s): "CHOL", "HDL", "LDLCALC", "TRIG", "CHOLHDL", "LDLDIRECT" in the last 72 hours.  Lab Results  Component Value Date   HGBA1C 5.0 03/07/2017   ------------------------------------------------------------------------------------------------------------------ No results for input(s): "TSH", "T4TOTAL", "T3FREE", "THYROIDAB" in the last 72 hours.  Invalid input(s): "FREET3"  Cardiac Enzymes No results for input(s): "CKMB", "TROPONINI", "MYOGLOBIN" in the last 168 hours.  Invalid input(s): "CK" ------------------------------------------------------------------------------------------------------------------    Component Value Date/Time   BNP 321.7 (H) 03/02/2023 0837    CBG: Recent Labs  Lab 03/06/23 1220 03/06/23 1842 03/07/23 0030 03/07/23 0648 03/07/23 1140  GLUCAP 79 115* 108* 81 136*     Recent Results (from the past 240 hour(s))  Pleural fluid culture w Gram Stain     Status: None   Collection Time: 02/25/23  1:07 PM   Specimen: Pleural Fluid  Result Value Ref Range Status   Specimen Description PLEURAL  Final   Special Requests RIGHT  Final   Gram Stain   Final    ABUNDANT WBC PRESENT,BOTH PMN AND MONONUCLEAR NO ORGANISMS SEEN    Culture   Final    NO GROWTH 3 DAYS Performed at [facility], [address], [place], [place] [address]    Report Status 02/28/2023 FINAL  Final     Radiology Studies: No results found.   Pamella Pert, MD, PhD [facility]  Between 7 am - 7 pm I am available, please contact me via Amion (for  emergencies) or Securechat (non urgent messages)  Between 7 pm - 7 am I am not available, please contact night coverage MD/APP via Amion

## 2023-03-07 NOTE — Progress Notes (Signed)
  Daily Progress Note   Patient Name: Kaitlin [name]       Date: 03/07/2023 DOB: [birth year]  Age: 89 y.o. MRN#: [id number] Attending Physician: Leatha Gilding, MD Primary Care Physician: Thana Ates, MD Admit Date: 02/20/2023 Length of Stay: 2 days  Discussed care with primary hospitalist today and bedside RN. Patient's medical conditioning worsening, likely from continued aspiration. Patient's son still noted to be confrontational towards palliative medicine teams' involvement. Noted palliative medicine team would continue to follow along and be available if family wishes to speak with PMT again. Thank you.    Alvester Morin, DO Palliative Care Provider PMT # [phone]

## 2023-03-07 NOTE — Progress Notes (Signed)
Pharmacy Antibiotic Note  Kaitlin [name] is a 89 y.o. female with aspiration pneumonia.  Plan per attending physician is for a short course of Unasyn.  Pharmacy has been consulted for dosing.  SCr wnl but estimated CrCl < 30 mL/min due to patient's age and weight.  No pending cultures.  Plan: Unasyn 1.5 grams IV q12h Follow clinical course, duration of therapy  Height: 5\' 3"  (160 cm) Weight: 42 kg (92 lb 9.5 oz) IBW/kg (Calculated) : 52.4  Temp (24hrs), Avg:97.6 F (36.4 C), Min:97.4 F (36.3 C), Max:97.7 F (36.5 C)  Recent Labs  Lab 03/02/23 0139 03/01/2023 0837 03/05/23 0427 03/06/23 0352 03/07/23 0419  WBC 14.3* 12.4* 9.7 11.4* 16.5*  CREATININE 0.36* 0.36* <0.30* 0.45 0.47    Estimated Creatinine Clearance: 29.1 mL/min (by C-G formula based on SCr of 0.47 mg/dL).    Allergies  Allergen Reactions   Dilaudid [Hydromorphone Hcl] Other (See Comments)    Dropped heart rate really low   Ciprofloxacin Other (See Comments)    Dizziness   Doxycycline     Sever dizziness and nausea   Gabapentin     Loss of balance, "weird feeling"   Codeine Nausea And Vomiting and Rash   Morphine Nausea And Vomiting and Rash   Morphine And Codeine Nausea And Vomiting and Rash   Nizatidine Other (See Comments)    Unknown reaction     Antimicrobials this admission: 5/24 Unasyn >>  Thank you for allowing pharmacy to be a part of this patient's care.  Elie Goody, PharmD, BCPS Clinical Pharmacist 03/07/2023  12:23 PM

## 2023-03-07 NOTE — Progress Notes (Signed)
PT Cancellation Note  Patient Details Name: Kaitlin [name] MRN: [id number] DOB: [birth year]   Cancelled Treatment:    Reason Eval/Treat Not Completed: Fatigue/lethargy limiting ability to participate. Pt supine in bed with family x4 present at bedside. Family reports pt groggy from pain medication and has her days/nights mixed up. Attempted to waken pt with loud voice, lights and sternal rub without success. Family reports pt is normally talkative. Family in agreement with PT checking back tomorrow for PT eval as able.    Domenick Bookbinder PT, DPT 03/07/23, 3:40 PM

## 2023-03-08 DIAGNOSIS — E871 Hypo-osmolality and hyponatremia: Secondary | ICD-10-CM | POA: Diagnosis not present

## 2023-03-08 LAB — BASIC METABOLIC PANEL
Anion gap: 10 (ref 5–15)
BUN: 32 mg/dL — ABNORMAL HIGH (ref 8–23)
CO2: 34 mmol/L — ABNORMAL HIGH (ref 22–32)
Calcium: 9 mg/dL (ref 8.9–10.3)
Chloride: 84 mmol/L — ABNORMAL LOW (ref 98–111)
Creatinine, Ser: 0.87 mg/dL (ref 0.44–1.00)
GFR, Estimated: >60 mL/min (ref >60)
Glucose, Bld: 115 mg/dL — ABNORMAL HIGH (ref 70–99)
Potassium: 4.4 mmol/L (ref 3.5–5.1)
Sodium: 128 mmol/L — ABNORMAL LOW (ref 135–145)

## 2023-03-08 LAB — CBC
HCT: 41.9 % (ref 36.0–46.0)
Hemoglobin: 12.6 g/dL (ref 12.0–15.0)
MCH: 26.5 pg (ref 26.0–34.0)
MCHC: 30.1 g/dL (ref 30.0–36.0)
MCV: 88 fL (ref 80.0–100.0)
Platelets: 484 10*3/uL — ABNORMAL HIGH (ref 150–400)
RBC: 4.76 MIL/uL (ref 3.87–5.11)
RDW: 17.9 % — ABNORMAL HIGH (ref 11.5–15.5)
WBC: 18.9 10*3/uL — ABNORMAL HIGH (ref 4.0–10.5)
nRBC: 0 % (ref 0.0–0.2)

## 2023-03-08 MED ORDER — FENTANYL CITRATE PF 50 MCG/ML IJ SOSY
12.5000 ug | PREFILLED_SYRINGE | Freq: Once | INTRAMUSCULAR | Status: AC
  Administered 2023-03-08: 12.5 ug via INTRAVENOUS
  Filled 2023-03-08: qty 1

## 2023-03-08 MED ORDER — FENTANYL CITRATE PF 50 MCG/ML IJ SOSY
12.5000 ug | PREFILLED_SYRINGE | INTRAMUSCULAR | Status: DC | PRN
  Administered 2023-03-08 – 2023-03-09 (×8): 12.5 ug via INTRAVENOUS
  Filled 2023-03-08 (×9): qty 1

## 2023-03-08 MED ORDER — FENTANYL 12 MCG/HR TD PT72
1.0000 | MEDICATED_PATCH | TRANSDERMAL | Status: DC
  Administered 2023-03-08: 1 via TRANSDERMAL
  Filled 2023-03-08: qty 1

## 2023-03-08 NOTE — Progress Notes (Signed)
Physical Therapy Discharge Patient Details Name: Kaitlin [name] MRN: [id number] DOB: 07/28/1927 Today's Date: 03/08/2023 Time:  -     Patient discharged from PT services secondary to medical decline -  [name] Acute Rehabilitation Services Office [phone] Weekend pager-[phone]  GP     Rada Hay 03/08/2023, 1:09 PM

## 2023-03-08 NOTE — Progress Notes (Signed)
PROGRESS NOTE  Kaitlin [name] ZOX:[id number] DOB: 11/30/1926 DOA: 02/15/2023 PCP: Thana Ates, MD   LOS: 3 days   Brief Narrative / Interim history: 89 year old female with anxiety, depression, A-fib, CAD, prior CVA, chronic dysphagia comes into the hospital with shortness of breath.  She was recently admitted 02/23/2023-03/03/2023 with multifocal pneumonia, bilateral parapneumonic effusions status post completed course of antibiotics as well as 650 mL of thoracentesis, comes into the hospital 1 day after discharge with shortness of breath.  Her son also reports that she has had increased confusion at home, poor appetite, difficulty sleeping.  Chest x-ray on admission showed bilateral pleural effusions with bibasilar atelectasis versus edema  Subjective / 24h Interval events: Declining more.  Less responsive today.  Wakes up elevated and tells me she is in pain.  Son is at bedside  Assesement and Plan: Principal Problem:   Hyponatremia Active Problems:   Persistent atrial fibrillation (HCC)   ANXIETY DEPRESSION   GERD   Essential hypertension, benign   Hyperlipidemia   Normocytic anemia   Generalized weakness   Aspiration into airway   Need for emotional support   Adult failure to thrive   Chronic pulmonary aspiration   Counseling and coordination of care   Palliative care encounter  Principal problem Failure to thrive/debility, deconditioning, goals of care- Decline since the last 6 months, especially since her fall and her sacral fractures.  Following that she has had hospitalization for pneumonia, and recurrent hospitalization currently.  She has chronic aspiration, extremely frail and in significant pain.  Discussed with the son, will liberalized her pain medications as she clearly is suffering.  He understands the extremely poor prognosis and that she may not survive.  Active problems Pelvic pain, recent sacral fractures -liberalize pain medications as  above  Hyponatremia -multifactorial in the setting of some fluid overload on admission due to elevated BNP and bilateral pleural effusions  Concern for aspiration, esophageal strictures -seeing GI as an outpatient, most recent EGD with dilation was in January 2023.  Comfort feeding  Aspiration pneumonia -ongoing aspiration.  White count increasing, poor prognosis  Pulmonary hypertension -2D echo done 5/21 showed LVEF 60-65%, RV was enlarged and PA pressure was 56 mmHg.  She has a small pericardial effusion, also severe tricuspid valve regurgitation.  Received Lasix x 2  Persistent atrial fibrillation (HCC) - CHA2DS2-VASc Score of at least 5.   Anxiety/depression- Has been having insomnia recently. On low-dose alprazolam as needed.   Recent multifocal pneumonia with bilateral parapneumonic effusions - pulmonary was consulted during her prior hospital stay for pleural effusion and thoracocentesis was done with removal of 650 mL of fluid.  Pleural fluid culture with no organisms, and pleural fluid cytology with no malignant cells.  Try Lasix   Acute metabolic encephalopathy -likely in the setting of acute illness/rehospitalization.  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  Family at that time was interested in active surveillance. Recent increase in size noted.  Recommend follow-up with pulmonary as outpatient.  Scheduled Meds:  apixaban  2.5 mg Oral BID   calcium carbonate  1 tablet Oral BID   cholecalciferol  1,000 Units Oral Daily   guaiFENesin  600 mg Oral BID  ipratropium-albuterol  3 mL Nebulization BID   ketorolac  15 mg Intravenous Q8H   lidocaine  1 patch Transdermal Q24H   LORazepam  0.25 mg Intravenous Once    magnesium oxide  400 mg Oral QHS   mirtazapine  7.5 mg Oral QHS   multivitamin  15 mL Oral Daily   nystatin  5 mL Oral QID   polyethylene glycol  8.5 g Oral Daily   senna  1 tablet Oral Daily   sodium chloride  1 g Oral TID WC   vitamin B-12  100 mcg Oral Daily   Continuous Infusions:  ampicillin-sulbactam (UNASYN) IV 1.5 g (03/08/23 0958)   PRN Meds:.acetaminophen **OR** acetaminophen, albuterol, ALPRAZolam, fentaNYL (SUBLIMAZE) injection, ketorolac, methocarbamol, nitroGLYCERIN, ondansetron **OR** ondansetron (ZOFRAN) IV, oxyCODONE  Current Outpatient Medications  Medication Instructions   albuterol (VENTOLIN HFA) 108 (90 Base) MCG/ACT inhaler 2 puffs, Inhalation, Every 6 hours PRN   ALPRAZolam (XANAX) 0.125 mg, Oral, 3 times daily PRN, For sleep   apixaban (ELIQUIS) 2.5 mg, Oral, 2 times daily   calcium carbonate (TUMS - DOSED IN MG ELEMENTAL CALCIUM) 500 MG chewable tablet 200 mg of elemental calcium, Oral, 2 times daily   cholecalciferol (VITAMIN D) 1,000 Units, Oral, Daily   Cyanocobalamin (VITAMIN B 12 PO) 1 tablet, Oral, Daily   guaiFENesin (MUCINEX) 600 mg, Oral, 2 times daily   MAGNESIUM PO 2 tablets, Oral, Daily at bedtime   methocarbamol (ROBAXIN) 500 mg, Oral, Every 6 hours PRN   Multiple Vitamin (MULTIVITAMIN) LIQD 15 mLs, Oral, Daily   nitroGLYCERIN (NITROSTAT) 0.4 mg, Every 5 min PRN   nystatin (MYCOSTATIN) 500,000 Units, Oral, 4 times daily   ondansetron (ZOFRAN) 4 mg, Oral, Daily PRN   oxyCODONE (OXY IR/ROXICODONE) 5 mg, Oral, 3 times daily   oxyCODONE (OXYCONTIN) 10 mg, Oral, Every 12 hours   polyethylene glycol powder (MIRALAX) 17 g, Oral, 2 times daily PRN   sennosides-docusate sodium (SENOKOT-S) 8.6-50 MG tablet 1-2 tablets, Oral, Daily PRN    Diet Orders (From admission, onward)     Start     Ordered   03/06/23 1042  DIET DYS 3 Room service appropriate? Yes; Fluid consistency: Thin  Diet effective now       Question Answer Comment  Room service  appropriate? Yes   Fluid consistency: Thin      03/06/23 1041            DVT prophylaxis: apixaban (ELIQUIS) tablet 2.5 mg Start: 02/12/2023 1500 apixaban (ELIQUIS) tablet 2.5 mg   Lab Results  Component Value Date   PLT 484 (H) 03/08/2023      Code Status: DNR  Family Communication: son at bedside   Status is: inpatient   Level of care: Progressive  Consultants:  none  Objective: Vitals:   03/07/23 1320 03/08/23 0000 03/08/23 0915 03/08/23 0924  BP: (!) 90/45 (!) 94/41  (!) 111/46  Pulse: 85 75  64  Resp:  15    Temp:  97.6 F (36.4 C)    TempSrc:  Axillary    SpO2: 91% 100% (!) 87% 99%  Weight:      Height:        Intake/Output Summary (Last 24 hours) at 03/08/2023 1114 Last data filed at 03/08/2023 0600 Gross per 24 hour  Intake 200 ml  Output 0 ml  Net 200 ml    Wt Readings from Last 3 Encounters:  03/13/2023 42 kg  02/23/23 39.9 kg  02/05/23 39.9 kg    Examination:  Constitutional: Frail-appearing, less responsive Eyes: lids and conjunctivae normal, no scleral icterus ENMT: Dry mucous membranes Neck: normal, supple Respiratory: Shallow respirations Cardiovascular: Regular rate and rhythm, no murmurs / rubs / gallops.  Abdomen: soft, no distention, no tenderness. Bowel sounds positive.  Skin: no rashes  Data Reviewed: I have independently reviewed following labs and imaging studies   CBC Recent Labs  Lab 02/15/2023 0837 03/05/23 0427 03/06/23 0352 03/07/23 0419 03/08/23 0412  WBC 12.4* 9.7 11.4* 16.5* 18.9*  HGB 11.6* 11.0* 11.6* 12.7 12.6  HCT 36.1 34.3* 36.4 39.0 41.9  PLT 356 358 409* 423* 484*  MCV 82.6 81.9 83.3 82.8 88.0  MCH 26.5 26.3 26.5 27.0 26.5  MCHC 32.1 32.1 31.9 32.6 30.1  RDW 16.2* 16.6* 16.7* 17.1* 17.9*     Recent Labs  Lab 03/02/23 0139 03/03/2023 0837 03/05/23 0427 03/06/23 0352 03/07/23 0419 03/08/23 0412  NA 130* 125* 125* 125* 125* 128*  K 3.8 3.9 3.5 3.8 3.3* 4.4  CL 90* 86* 88* 86* 83* 84*  CO2 34*  31 30 29  33* 34*  GLUCOSE 112* 105* 102* 77 92 115*  BUN 6* 8 6* 11 18 32*  CREATININE 0.36* 0.36* <0.30* 0.45 0.47 0.87  CALCIUM 8.4* 8.0* 7.9* 8.1* 8.2* 9.0  AST  --  18 17 15   --   --   ALT  --  14 11 12   --   --   ALKPHOS  --  108 100 94  --   --   BILITOT  --  0.8 0.8 0.8  --   --   ALBUMIN  --  2.6* 2.5* 2.6*  --   --   MG 1.8 1.8  --  1.9 1.9  --   BNP  --  321.7*  --   --   --   --      ------------------------------------------------------------------------------------------------------------------ No results for input(s): "CHOL", "HDL", "LDLCALC", "TRIG", "CHOLHDL", "LDLDIRECT" in the last 72 hours.  Lab Results  Component Value Date   HGBA1C 5.0 03/07/2017   ------------------------------------------------------------------------------------------------------------------ No results for input(s): "TSH", "T4TOTAL", "T3FREE", "THYROIDAB" in the last 72 hours.  Invalid input(s): "FREET3"  Cardiac Enzymes No results for input(s): "CKMB", "TROPONINI", "MYOGLOBIN" in the last 168 hours.  Invalid input(s): "CK" ------------------------------------------------------------------------------------------------------------------    Component Value Date/Time   BNP 321.7 (H) 02/27/2023 0837    CBG: Recent Labs  Lab 03/06/23 1842 03/07/23 0030 03/07/23 0648 03/07/23 1140 03/07/23 1804  GLUCAP 115* 108* 81 136* 135*     No results found for this or any previous visit (from the past 240 hour(s)).    Radiology Studies: No results found.   Pamella Pert, MD, PhD [facility]  Between 7 am - 7 pm I am available, please contact me via Amion (for emergencies) or Securechat (non urgent messages)  Between 7 pm - 7 am I am not available, please contact night coverage MD/APP via Amion

## 2023-03-08 NOTE — Progress Notes (Signed)
Pt unable to take oral medications at this time. Remains minimally responsive to pain and touch and has extensive history for aspiration. MD notified.

## 2023-03-09 ENCOUNTER — Inpatient Hospital Stay (HOSPITAL_COMMUNITY): Payer: Medicare Other

## 2023-03-09 DIAGNOSIS — E871 Hypo-osmolality and hyponatremia: Secondary | ICD-10-CM | POA: Diagnosis not present

## 2023-03-09 MED ORDER — FENTANYL 25 MCG/HR TD PT72
1.0000 | MEDICATED_PATCH | TRANSDERMAL | Status: DC
  Administered 2023-03-09: 1 via TRANSDERMAL
  Filled 2023-03-09: qty 1

## 2023-03-09 NOTE — Progress Notes (Signed)
PROGRESS NOTE  Kaitlin [name] YQM:[id number] DOB: 1926/12/11 DOA: 03/03/2023 PCP: Thana Ates, MD   LOS: 4 days   Brief Narrative / Interim history: 89 year old female with anxiety, depression, A-fib, CAD, prior CVA, chronic dysphagia comes into the hospital with shortness of breath.  She was recently admitted 02/23/2023-03/03/2023 with multifocal pneumonia, bilateral parapneumonic effusions status post completed course of antibiotics as well as 650 mL of thoracentesis, comes into the hospital 1 day after discharge with shortness of breath.  Her son also reports that she has had increased confusion at home, poor appetite, difficulty sleeping.  Chest x-ray on admission showed bilateral pleural effusions with bibasilar atelectasis versus edema  Subjective / 24h Interval events: Crying this morning, complains of severe pain.  Son is at bedside.  She is also been complaining of being sick to her stomach as well as right lower quadrant abdominal pain  Assesement and Plan: Principal Problem:   Hyponatremia Active Problems:   Persistent atrial fibrillation (HCC)   ANXIETY DEPRESSION   GERD   Essential hypertension, benign   Hyperlipidemia   Normocytic anemia   Generalized weakness   Aspiration into airway   Need for emotional support   Adult failure to thrive   Chronic pulmonary aspiration   Counseling and coordination of care   Palliative care encounter  Principal problem Failure to thrive/debility, deconditioning, goals of care- Decline since the last 6 months, especially since her fall and her sacral fractures.  Following that she has had hospitalization for pneumonia, and recurrent hospitalization currently.  She has chronic aspiration, extremely frail and in significant pain.  Discussed with the son, will liberalized her pain medications as she clearly is suffering.  He understands the extremely poor prognosis -Continues to have significant pain, increased fentanyl patch to 25  mcg/h.  Continue IV fentanyl  Active problems Pelvic pain, recent sacral fractures -liberalize pain medications as above  Abdominal pain -quite tender when I touch her right lower quadrant.  Obtain CT scan  Hyponatremia -multifactorial in the setting of some fluid overload on admission due to elevated BNP and bilateral pleural effusions  Concern for aspiration, esophageal strictures -seeing GI as an outpatient, most recent EGD with dilation was in January 2023.  Comfort feeding  Aspiration pneumonia -ongoing aspiration.  White count increasing, poor prognosis.  Pulmonary hypertension -2D echo done 5/21 showed LVEF 60-65%, RV was enlarged and PA pressure was 56 mmHg.  She has a small pericardial effusion, also severe tricuspid valve regurgitation.  Received Lasix x 2  Persistent atrial fibrillation (HCC) - CHA2DS2-VASc Score of at least 5.   Anxiety/depression- Has been having insomnia recently. On low-dose alprazolam as needed.   Recent multifocal pneumonia with bilateral parapneumonic effusions - pulmonary was consulted during her prior hospital stay for pleural effusion and thoracocentesis was done with removal of 650 mL of fluid.  Pleural fluid culture with no organisms, and pleural fluid cytology with no malignant cells.  Try Lasix   Acute metabolic encephalopathy -likely in the setting of acute illness/rehospitalization.  She recently had an MRI of the brain on 02/24/2023 which was without acute findings, mental status varies and she is confused at times.  Suspect in the setting of pain medications   Previous NSTEMI 01/2014 with normal coronaries -no chest pain, stable   MGUS - Will need outpatient follow-up    RUL mass - Previously seen by Dr. Everardo All of pulmonology in the outpatient setting.  Family at that time was interested in active surveillance. Recent  increase in size noted.  Recommend follow-up with pulmonary as outpatient.  Scheduled Meds:  apixaban  2.5 mg Oral BID    calcium carbonate  1 tablet Oral BID   cholecalciferol  1,000 Units Oral Daily   fentaNYL  1 patch Transdermal Q72H   guaiFENesin  600 mg Oral BID   ipratropium-albuterol  3 mL Nebulization BID   lidocaine  1 patch Transdermal Q24H   magnesium oxide  400 mg Oral QHS   mirtazapine  7.5 mg Oral QHS   multivitamin  15 mL Oral Daily   nystatin  5 mL Oral QID   polyethylene glycol  8.5 g Oral Daily   senna  1 tablet Oral Daily   sodium chloride  1 g Oral TID WC   vitamin B-12  100 mcg Oral Daily   Continuous Infusions:  ampicillin-sulbactam (UNASYN) IV 1.5 g (03/08/23 2329)   PRN Meds:.acetaminophen **OR** acetaminophen, albuterol, ALPRAZolam, fentaNYL (SUBLIMAZE) injection, methocarbamol, nitroGLYCERIN, ondansetron **OR** ondansetron (ZOFRAN) IV, oxyCODONE  Current Outpatient Medications  Medication Instructions   albuterol (VENTOLIN HFA) 108 (90 Base) MCG/ACT inhaler 2 puffs, Inhalation, Every 6 hours PRN   ALPRAZolam (XANAX) 0.125 mg, Oral, 3 times daily PRN, For sleep   apixaban (ELIQUIS) 2.5 mg, Oral, 2 times daily   calcium carbonate (TUMS - DOSED IN MG ELEMENTAL CALCIUM) 500 MG chewable tablet 200 mg of elemental calcium, Oral, 2 times daily   cholecalciferol (VITAMIN D) 1,000 Units, Oral, Daily   Cyanocobalamin (VITAMIN B 12 PO) 1 tablet, Oral, Daily   guaiFENesin (MUCINEX) 600 mg, Oral, 2 times daily   MAGNESIUM PO 2 tablets, Oral, Daily at bedtime   methocarbamol (ROBAXIN) 500 mg, Oral, Every 6 hours PRN   Multiple Vitamin (MULTIVITAMIN) LIQD 15 mLs, Oral, Daily   nitroGLYCERIN (NITROSTAT) 0.4 mg, Every 5 min PRN   nystatin (MYCOSTATIN) 500,000 Units, Oral, 4 times daily   ondansetron (ZOFRAN) 4 mg, Oral, Daily PRN   oxyCODONE (OXY IR/ROXICODONE) 5 mg, Oral, 3 times daily   oxyCODONE (OXYCONTIN) 10 mg, Oral, Every 12 hours   polyethylene glycol powder (MIRALAX) 17 g, Oral, 2 times daily PRN   sennosides-docusate sodium (SENOKOT-S) 8.6-50 MG tablet 1-2 tablets, Oral, Daily  PRN    Diet Orders (From admission, onward)     Start     Ordered   03/06/23 1042  DIET DYS 3 Room service appropriate? Yes; Fluid consistency: Thin  Diet effective now       Question Answer Comment  Room service appropriate? Yes   Fluid consistency: Thin      03/06/23 1041            DVT prophylaxis: apixaban (ELIQUIS) tablet 2.5 mg Start: 03/13/2023 1500 apixaban (ELIQUIS) tablet 2.5 mg   Lab Results  Component Value Date   PLT 484 (H) 03/08/2023      Code Status: DNR  Family Communication: son at bedside   Status is: inpatient   Level of care: Progressive  Consultants:  none  Objective: Vitals:   03/08/23 0924 03/08/23 1600 03/08/23 1810 03/08/23 2152  BP: (!) 111/46 (!) 114/47  (!) 132/58  Pulse: 64 65  73  Resp:  18  16  Temp:  97.6 F (36.4 C)  (!) 97.5 F (36.4 C)  TempSrc:  Oral  Oral  SpO2: 99% 98% 97% 92%  Weight:      Height:        Intake/Output Summary (Last 24 hours) at 03/09/2023 1027 Last data filed at 03/09/2023 0400  Gross per 24 hour  Intake 200 ml  Output --  Net 200 ml    Wt Readings from Last 3 Encounters:  02/12/2023 42 kg  02/23/23 39.9 kg  02/05/23 39.9 kg    Examination:  Constitutional: NAD Eyes: lids and conjunctivae normal, no scleral icterus ENMT: mmm Neck: normal, supple Respiratory: clear to auscultation bilaterally, no wheezing, no crackles. Normal respiratory effort.  Cardiovascular: Regular rate and rhythm, no murmurs / rubs / gallops. No LE edema. Abdomen: soft, no distention, no tenderness. Bowel sounds positive.   Data Reviewed: I have independently reviewed following labs and imaging studies   CBC Recent Labs  Lab 03/14/2023 0837 03/05/23 0427 03/06/23 0352 03/07/23 0419 03/08/23 0412  WBC 12.4* 9.7 11.4* 16.5* 18.9*  HGB 11.6* 11.0* 11.6* 12.7 12.6  HCT 36.1 34.3* 36.4 39.0 41.9  PLT 356 358 409* 423* 484*  MCV 82.6 81.9 83.3 82.8 88.0  MCH 26.5 26.3 26.5 27.0 26.5  MCHC 32.1 32.1 31.9 32.6  30.1  RDW 16.2* 16.6* 16.7* 17.1* 17.9*     Recent Labs  Lab 02/24/2023 0837 03/05/23 0427 03/06/23 0352 03/07/23 0419 03/08/23 0412  NA 125* 125* 125* 125* 128*  K 3.9 3.5 3.8 3.3* 4.4  CL 86* 88* 86* 83* 84*  CO2 31 30 29  33* 34*  GLUCOSE 105* 102* 77 92 115*  BUN 8 6* 11 18 32*  CREATININE 0.36* <0.30* 0.45 0.47 0.87  CALCIUM 8.0* 7.9* 8.1* 8.2* 9.0  AST 18 17 15   --   --   ALT 14 11 12   --   --   ALKPHOS 108 100 94  --   --   BILITOT 0.8 0.8 0.8  --   --   ALBUMIN 2.6* 2.5* 2.6*  --   --   MG 1.8  --  1.9 1.9  --   BNP 321.7*  --   --   --   --      ------------------------------------------------------------------------------------------------------------------ No results for input(s): "CHOL", "HDL", "LDLCALC", "TRIG", "CHOLHDL", "LDLDIRECT" in the last 72 hours.  Lab Results  Component Value Date   HGBA1C 5.0 03/07/2017   ------------------------------------------------------------------------------------------------------------------ No results for input(s): "TSH", "T4TOTAL", "T3FREE", "THYROIDAB" in the last 72 hours.  Invalid input(s): "FREET3"  Cardiac Enzymes No results for input(s): "CKMB", "TROPONINI", "MYOGLOBIN" in the last 168 hours.  Invalid input(s): "CK" ------------------------------------------------------------------------------------------------------------------    Component Value Date/Time   BNP 321.7 (H) 02/25/2023 0837    CBG: Recent Labs  Lab 03/06/23 1842 03/07/23 0030 03/07/23 0648 03/07/23 1140 03/07/23 1804  GLUCAP 115* 108* 81 136* 135*     No results found for this or any previous visit (from the past 240 hour(s)).    Radiology Studies: No results found.   Pamella Pert, MD, PhD [facility]  Between 7 am - 7 pm I am available, please contact me via Amion (for emergencies) or Securechat (non urgent messages)  Between 7 pm - 7 am I am not available, please contact night coverage MD/APP via Amion

## 2023-03-09 NOTE — Plan of Care (Signed)
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
## 2023-03-09 NOTE — Plan of Care (Signed)
  Problem: Education: Goal: Knowledge of General Education information will improve Description: Including pain rating scale, medication(s)/side effects and non-pharmacologic comfort measures Outcome: Not Progressing   Problem: Health Behavior/Discharge Planning: Goal: Ability to manage health-related needs will improve Outcome: Not Progressing   

## 2023-03-09 NOTE — Plan of Care (Signed)
  Problem: Fluid Volume: Goal: Hemodynamic stability will improve Outcome: Progressing   Problem: Clinical Measurements: Goal: Diagnostic test results will improve Outcome: Progressing   

## 2023-03-10 DIAGNOSIS — E871 Hypo-osmolality and hyponatremia: Secondary | ICD-10-CM | POA: Diagnosis not present

## 2023-03-10 LAB — BASIC METABOLIC PANEL
Anion gap: 11 (ref 5–15)
BUN: 49 mg/dL — ABNORMAL HIGH (ref 8–23)
CO2: 34 mmol/L — ABNORMAL HIGH (ref 22–32)
Calcium: 8.6 mg/dL — ABNORMAL LOW (ref 8.9–10.3)
Chloride: 92 mmol/L — ABNORMAL LOW (ref 98–111)
Creatinine, Ser: 0.59 mg/dL (ref 0.44–1.00)
GFR, Estimated: >60 mL/min (ref >60)
Glucose, Bld: 99 mg/dL (ref 70–99)
Potassium: 4.4 mmol/L (ref 3.5–5.1)
Sodium: 137 mmol/L (ref 135–145)

## 2023-03-10 LAB — CBC
HCT: 32.4 % — ABNORMAL LOW (ref 36.0–46.0)
Hemoglobin: 10.1 g/dL — ABNORMAL LOW (ref 12.0–15.0)
MCH: 27.6 pg (ref 26.0–34.0)
MCHC: 31.2 g/dL (ref 30.0–36.0)
MCV: 88.5 fL (ref 80.0–100.0)
Platelets: 402 10*3/uL — ABNORMAL HIGH (ref 150–400)
RBC: 3.66 MIL/uL — ABNORMAL LOW (ref 3.87–5.11)
RDW: 17.9 % — ABNORMAL HIGH (ref 11.5–15.5)
WBC: 16.2 10*3/uL — ABNORMAL HIGH (ref 4.0–10.5)
nRBC: 0 % (ref 0.0–0.2)

## 2023-03-10 LAB — MAGNESIUM: Magnesium: 2.3 mg/dL (ref 1.7–2.4)

## 2023-03-10 MED ORDER — GLYCERIN (LAXATIVE) 2 G RE SUPP
1.0000 | Freq: Once | RECTAL | Status: AC
  Administered 2023-03-10: 1 via RECTAL
  Filled 2023-03-10: qty 1

## 2023-03-10 MED ORDER — SODIUM CHLORIDE 0.9 % IV SOLN
3.0000 g | Freq: Two times a day (BID) | INTRAVENOUS | Status: DC
  Administered 2023-03-10 – 2023-03-11 (×4): 3 g via INTRAVENOUS
  Filled 2023-03-10 (×4): qty 8

## 2023-03-10 NOTE — Progress Notes (Addendum)
PHARMACY NOTE -  Unasyn  Pharmacy has been assisting with dosing of Unasyn for aspiration pneumonia. CrCl borderline d/t low weight, and with SCr at baseline, this is unlikely to change Remains afebrile; WBC trending down O2 needs escalated to 3L by HFNC No cultures drawn Will increase Unasyn to 3g IV q12 hr  Pharmacy will sign off, following peripherally for culture results, dose adjustments, and length of therapy. Please reconsult if a change in clinical status warrants re-evaluation of dosage.  Bernadene Person, PharmD, BCPS [phone] 03/10/2023, 8:57 AM

## 2023-03-10 NOTE — Progress Notes (Signed)
PROGRESS NOTE  Kaitlin [name] WUJ:[id number] DOB: 25-Feb-1927 DOA: 02/25/2023 PCP: Thana Ates, MD   LOS: 5 days   Brief Narrative / Interim history: 89 year old female with anxiety, depression, A-fib, CAD, prior CVA, chronic dysphagia comes into the hospital with shortness of breath.  She was recently admitted 02/23/2023-03/03/2023 with multifocal pneumonia, bilateral parapneumonic effusions status post completed course of antibiotics as well as 650 mL of thoracentesis, comes into the hospital 1 day after discharge with shortness of breath.  Her son also reports that she has had increased confusion at home, poor appetite, difficulty sleeping.  Chest x-ray on admission showed bilateral pleural effusions with bibasilar atelectasis versus edema  Subjective / 24h Interval events: Ongoing pain.  Son is at bedside.  Assesement and Plan: Principal Problem:   Hyponatremia Active Problems:   Persistent atrial fibrillation (HCC)   ANXIETY DEPRESSION   GERD   Essential hypertension, benign   Hyperlipidemia   Normocytic anemia   Generalized weakness   Aspiration into airway   Need for emotional support   Adult failure to thrive   Chronic pulmonary aspiration   Counseling and coordination of care   Palliative care encounter  Principal problem Failure to thrive/debility, deconditioning, goals of care- Decline since the last 6 months, especially since her fall and her sacral fractures.  Following that she has had hospitalization for pneumonia, and recurrent hospitalization currently.  She has chronic aspiration, extremely frail and in significant pain.  Discussed with the son, will liberalized her pain medications as she clearly is suffering.  He understands the extremely poor prognosis -Continues to have significant pain, continue fentanyl patch, dose was increased to 25 mcg yesterday.  Seems to be working better as she only got fentanyl IV only 1 time yesterday morning  Active  problems Pelvic pain, recent sacral fractures -liberalize pain medications as above  Abdominal pain -quite tender when I touch her right lower quadrant.  CT scan unremarkable.  Likely constipation.  Glycerin suppository today  Hyponatremia -multifactorial in the setting of some fluid overload on admission due to elevated BNP and bilateral pleural effusions.  Sodium now normalized but I think that she is going to jump on the other side and get dehydrated/hypernatremic given poor p.o. intake  Concern for aspiration, esophageal strictures -seeing GI as an outpatient, most recent EGD with dilation was in January 2023.  Comfort feeding  Aspiration pneumonia -ongoing aspiration.  White count increasing, poor prognosis.  Pulmonary hypertension -2D echo done 5/21 showed LVEF 60-65%, RV was enlarged and PA pressure was 56 mmHg.  She has a small pericardial effusion, also severe tricuspid valve regurgitation.  Received Lasix x 2  Persistent atrial fibrillation (HCC) - CHA2DS2-VASc Score of at least 5.   Anxiety/depression- Has been having insomnia recently. On low-dose alprazolam as needed.   Recent multifocal pneumonia with bilateral parapneumonic effusions - pulmonary was consulted during her prior hospital stay for pleural effusion and thoracocentesis was done with removal of 650 mL of fluid.  Pleural fluid culture with no organisms, and pleural fluid cytology with no malignant cells.  Try Lasix   Acute metabolic encephalopathy -likely in the setting of acute illness/rehospitalization.  She recently had an MRI of the brain on 02/24/2023 which was without acute findings, mental status varies and she is confused at times.  Suspect in the setting of pain medications   Previous NSTEMI 01/2014 with normal coronaries -no chest pain, stable   MGUS - Will need outpatient follow-up    RUL mass -  Previously seen by Dr. Everardo All of pulmonology in the outpatient setting.  Family at that time was interested in  active surveillance. Recent increase in size noted.  Recommend follow-up with pulmonary as outpatient.  Scheduled Meds:  apixaban  2.5 mg Oral BID   calcium carbonate  1 tablet Oral BID   cholecalciferol  1,000 Units Oral Daily   fentaNYL  1 patch Transdermal Q72H   Glycerin (Adult)  1 suppository Rectal Once   guaiFENesin  600 mg Oral BID   ipratropium-albuterol  3 mL Nebulization BID   lidocaine  1 patch Transdermal Q24H   magnesium oxide  400 mg Oral QHS   mirtazapine  7.5 mg Oral QHS   multivitamin  15 mL Oral Daily   nystatin  5 mL Oral QID   polyethylene glycol  8.5 g Oral Daily   senna  1 tablet Oral Daily   sodium chloride  1 g Oral TID WC   vitamin B-12  100 mcg Oral Daily   Continuous Infusions:  ampicillin-sulbactam (UNASYN) IV     PRN Meds:.acetaminophen **OR** acetaminophen, albuterol, ALPRAZolam, fentaNYL (SUBLIMAZE) injection, methocarbamol, nitroGLYCERIN, ondansetron **OR** ondansetron (ZOFRAN) IV, oxyCODONE  Current Outpatient Medications  Medication Instructions   albuterol (VENTOLIN HFA) 108 (90 Base) MCG/ACT inhaler 2 puffs, Inhalation, Every 6 hours PRN   ALPRAZolam (XANAX) 0.125 mg, Oral, 3 times daily PRN, For sleep   apixaban (ELIQUIS) 2.5 mg, Oral, 2 times daily   calcium carbonate (TUMS - DOSED IN MG ELEMENTAL CALCIUM) 500 MG chewable tablet 200 mg of elemental calcium, Oral, 2 times daily   cholecalciferol (VITAMIN D) 1,000 Units, Oral, Daily   Cyanocobalamin (VITAMIN B 12 PO) 1 tablet, Oral, Daily   guaiFENesin (MUCINEX) 600 mg, Oral, 2 times daily   MAGNESIUM PO 2 tablets, Oral, Daily at bedtime   methocarbamol (ROBAXIN) 500 mg, Oral, Every 6 hours PRN   Multiple Vitamin (MULTIVITAMIN) LIQD 15 mLs, Oral, Daily   nitroGLYCERIN (NITROSTAT) 0.4 mg, Every 5 min PRN   nystatin (MYCOSTATIN) 500,000 Units, Oral, 4 times daily   ondansetron (ZOFRAN) 4 mg, Oral, Daily PRN   oxyCODONE (OXY IR/ROXICODONE) 5 mg, Oral, 3 times daily   oxyCODONE (OXYCONTIN) 10  mg, Oral, Every 12 hours   polyethylene glycol powder (MIRALAX) 17 g, Oral, 2 times daily PRN   sennosides-docusate sodium (SENOKOT-S) 8.6-50 MG tablet 1-2 tablets, Oral, Daily PRN    Diet Orders (From admission, onward)     Start     Ordered   03/06/23 1042  DIET DYS 3 Room service appropriate? Yes; Fluid consistency: Thin  Diet effective now       Question Answer Comment  Room service appropriate? Yes   Fluid consistency: Thin      03/06/23 1041            DVT prophylaxis: apixaban (ELIQUIS) tablet 2.5 mg Start: 03/01/2023 1500 apixaban (ELIQUIS) tablet 2.5 mg   Lab Results  Component Value Date   PLT 402 (H) 03/10/2023      Code Status: DNR  Family Communication: son at bedside   Status is: inpatient   Level of care: Progressive  Consultants:  none  Objective: Vitals:   03/10/23 0400 03/10/23 0600 03/10/23 0645 03/10/23 0817  BP:   134/74   Pulse:   86   Resp: 20 17 20    Temp:   97.9 F (36.6 C)   TempSrc:   Axillary   SpO2:   97% 97%  Weight:  Height:        Intake/Output Summary (Last 24 hours) at 03/10/2023 1105 Last data filed at 03/10/2023 0500 Gross per 24 hour  Intake 200 ml  Output 550 ml  Net -350 ml    Wt Readings from Last 3 Encounters:  02/25/2023 42 kg  02/23/23 39.9 kg  02/05/23 39.9 kg    Examination:  Constitutional: NAD Eyes: lids and conjunctivae normal, no scleral icterus ENMT: mmm Neck: normal, supple Respiratory: clear to auscultation bilaterally, no wheezing, no crackles.  Cardiovascular: Regular rate and rhythm, no murmurs / rubs / gallops. No LE edema. Abdomen: soft, no distention, no tenderness. Bowel sounds positive.   Data Reviewed: I have independently reviewed following labs and imaging studies   CBC Recent Labs  Lab 03/05/23 0427 03/06/23 0352 03/07/23 0419 03/08/23 0412 03/10/23 0333  WBC 9.7 11.4* 16.5* 18.9* 16.2*  HGB 11.0* 11.6* 12.7 12.6 10.1*  HCT 34.3* 36.4 39.0 41.9 32.4*  PLT 358 409*  423* 484* 402*  MCV 81.9 83.3 82.8 88.0 88.5  MCH 26.3 26.5 27.0 26.5 27.6  MCHC 32.1 31.9 32.6 30.1 31.2  RDW 16.6* 16.7* 17.1* 17.9* 17.9*     Recent Labs  Lab 03/01/2023 0837 03/05/23 0427 03/06/23 0352 03/07/23 0419 03/08/23 0412 03/10/23 0333  NA 125* 125* 125* 125* 128* 137  K 3.9 3.5 3.8 3.3* 4.4 4.4  CL 86* 88* 86* 83* 84* 92*  CO2 31 30 29  33* 34* 34*  GLUCOSE 105* 102* 77 92 115* 99  BUN 8 6* 11 18 32* 49*  CREATININE 0.36* <0.30* 0.45 0.47 0.87 0.59  CALCIUM 8.0* 7.9* 8.1* 8.2* 9.0 8.6*  AST 18 17 15   --   --   --   ALT 14 11 12   --   --   --   ALKPHOS 108 100 94  --   --   --   BILITOT 0.8 0.8 0.8  --   --   --   ALBUMIN 2.6* 2.5* 2.6*  --   --   --   MG 1.8  --  1.9 1.9  --  2.3  BNP 321.7*  --   --   --   --   --      ------------------------------------------------------------------------------------------------------------------ No results for input(s): "CHOL", "HDL", "LDLCALC", "TRIG", "CHOLHDL", "LDLDIRECT" in the last 72 hours.  Lab Results  Component Value Date   HGBA1C 5.0 03/07/2017   ------------------------------------------------------------------------------------------------------------------ No results for input(s): "TSH", "T4TOTAL", "T3FREE", "THYROIDAB" in the last 72 hours.  Invalid input(s): "FREET3"  Cardiac Enzymes No results for input(s): "CKMB", "TROPONINI", "MYOGLOBIN" in the last 168 hours.  Invalid input(s): "CK" ------------------------------------------------------------------------------------------------------------------    Component Value Date/Time   BNP 321.7 (H) 02/18/2023 0837    CBG: Recent Labs  Lab 03/06/23 1842 03/07/23 0030 03/07/23 0648 03/07/23 1140 03/07/23 1804  GLUCAP 115* 108* 81 136* 135*     No results found for this or any previous visit (from the past 240 hour(s)).    Radiology Studies: CT ABDOMEN PELVIS WO CONTRAST  Result Date: 03/09/2023 CLINICAL DATA:  Abdominal pain. EXAM: CT  ABDOMEN AND PELVIS WITHOUT CONTRAST TECHNIQUE: Multidetector CT imaging of the abdomen and pelvis was performed following the standard protocol without IV contrast. RADIATION DOSE REDUCTION: This exam was performed according to the departmental dose-optimization program which includes automated exposure control, adjustment of the mA and/or kV according to patient size and/or use of iterative reconstruction technique. COMPARISON:  February 05, 2023 FINDINGS: Lower chest: Bilateral pleural  effusions. Bronchiectasis. Scarring versus atelectasis in the lingular and right middle lobe. Atelectasis versus airspace consolidation in the right lower lobe. Hepatobiliary: No focal lesions seen. Persistent moderate intrahepatic biliary ductal dilation. Pancreas: Chronic main pancreatic duct dilation. Spleen: Peripheral splenic calcifications. Adrenals/Urinary Tract: Adrenal glands are unremarkable. Left kidney is without renal calculi, focal lesion, or hydronephrosis. Subcentimeter right renal cyst. Bladder is unremarkable. Stomach/Bowel: Stomach is within normal limits. No evidence of bowel wall thickening, distention, or inflammatory changes. Moderate stool burden. Vascular/Lymphatic: Aortic atherosclerosis. No enlarged abdominal or pelvic lymph nodes. Reproductive: Status post hysterectomy. No adnexal masses. Other: Pelvic floor laxity.  No ascites. Musculoskeletal: Spondylosis and scoliosis of the lumbosacral spine. IMPRESSION: 1. No acute findings within the abdomen or pelvis. 2. Bilateral pleural effusions. 3. Atelectasis versus airspace consolidation in the right lower lobe. 4. Persistent moderate intrahepatic biliary ductal dilation. 5. Chronic main pancreatic duct dilation. 6. Moderate stool burden. 7. Pelvic floor laxity. 8. Aortic atherosclerosis. Aortic Atherosclerosis (ICD10-I70.0). Electronically Signed   By: Ted Mcalpine M.D.   On: 03/09/2023 20:37     Pamella Pert, MD, PhD [facility]  Between  7 am - 7 pm I am available, please contact me via Amion (for emergencies) or Securechat (non urgent messages)  Between 7 pm - 7 am I am not available, please contact night coverage MD/APP via Amion

## 2023-03-10 NOTE — Progress Notes (Signed)
[facility] [employer] engineer, contracting [facility]) Hospital Liaison Note   Referral received from TOC/Christina for hospice services in the home upon DC. MSW spoke with son/Mike and provided extensive education for hospice services.  Kathlene November requested additional time to consider options before proceeding. All questions answered and no concerns voiced.  At this time, patient is not under review for [facility] services as family continues to have ongoing GOC discussions. Kathlene November reports that he will contact [facility] if family chooses to proceed with hospice.   [facility] information and contact numbers given to family & above information shared with TOC.   Please call with any questions/concerns.    Thank you for the opportunity to participate in this patient's care.   Eugenie Birks, MSW [facility] Liaison

## 2023-03-10 NOTE — Plan of Care (Signed)
  Problem: Clinical Measurements: Goal: Will remain free from infection Outcome: Progressing Goal: Diagnostic test results will improve Outcome: Progressing Goal: Cardiovascular complication will be avoided Outcome: Progressing   Problem: Education: Goal: Knowledge of General Education information will improve Description: Including pain rating scale, medication(s)/side effects and non-pharmacologic comfort measures Outcome: Not Progressing   Problem: Health Behavior/Discharge Planning: Goal: Ability to manage health-related needs will improve Outcome: Not Progressing   Problem: Clinical Measurements: Goal: Respiratory complications will improve Outcome: Not Progressing   Problem: Activity: Goal: Risk for activity intolerance will decrease Outcome: Not Progressing

## 2023-03-10 NOTE — TOC Progression Note (Signed)
Transition of Care [facility]) - Progression Note    Patient Details  Name: Kaitlin [name] MRN: [id number] Date of Birth: 10-10-1927  Transition of Care [facility]) CM/SW Contact  Larrie Kass, LCSW Phone Number: 03/10/2023, 9:54 AM  Clinical Narrative:    CSW met with pt's son and provided a list of hospice agencies. Pt's son expressed his frustrations and reported his willingness to speak with a different palliative medicine provider. Pt's son has chosen [facility], and a referral was sent. TOC to follow       Expected Discharge Plan and Services                                               Social Determinants of Health (SDOH) Interventions SDOH Screenings   Food Insecurity: No Food Insecurity (03/03/2023)  Housing: Low Risk  (02/17/2023)  Transportation Needs: No Transportation Needs (03/14/2023)  Utilities: Not At Risk (02/21/2023)  Tobacco Use: Low Risk  (03/11/2023)    Readmission Risk Interventions    09/25/2022   10:02 AM  Readmission Risk Prevention Plan  Post Dischage Appt Complete  Medication Screening Complete  Transportation Screening Complete

## 2023-03-10 NOTE — Progress Notes (Signed)
  Daily Progress Note   Patient Name: Kaitlin [name]       Date: 03/10/2023 DOB: [birth year]  Age: 89 y.o. MRN#: [id number] Attending Physician: Leatha Gilding, MD Primary Care Physician: Thana Ates, MD Admit Date: 02/16/2023 Length of Stay: 5 days  Discussed care with hospitalist today.  Son continues engaging in goals of care conversations with hospitalist.  Patient is now receiving medications for comfort and planning to go home with hospice.  Hospitalist assisting with coordination of this.  Palliative medicine team has been following along peripherally with patient's medical journey. Please reach out if needs arise with palliative medicine team involvement.  Thank you.  Alvester Morin, DO Palliative Care Provider PMT # [phone]

## 2023-03-11 ENCOUNTER — Inpatient Hospital Stay (HOSPITAL_COMMUNITY): Payer: Medicare Other

## 2023-03-11 ENCOUNTER — Telehealth: Payer: Self-pay | Admitting: Internal Medicine

## 2023-03-11 ENCOUNTER — Ambulatory Visit: Payer: Medicare Other | Attending: Cardiology | Admitting: Cardiology

## 2023-03-11 DIAGNOSIS — E871 Hypo-osmolality and hyponatremia: Secondary | ICD-10-CM | POA: Diagnosis not present

## 2023-03-11 LAB — BLOOD GAS, VENOUS
Acid-Base Excess: 13.6 mmol/L — ABNORMAL HIGH (ref 0.0–2.0)
Bicarbonate: 48.1 mmol/L — ABNORMAL HIGH (ref 20.0–28.0)
O2 Saturation: 100 %
Patient temperature: 37
pCO2, Ven: >123 mmHg (ref 44–60)
pH, Ven: 7.16 — CL (ref 7.25–7.43)
pO2, Ven: 201 mmHg — ABNORMAL HIGH (ref 32–45)

## 2023-03-11 LAB — CBC
HCT: 34.6 % — ABNORMAL LOW (ref 36.0–46.0)
HCT: 33.7 % — ABNORMAL LOW (ref 36.0–46.0)
Hemoglobin: 9.8 g/dL — ABNORMAL LOW (ref 12.0–15.0)
Hemoglobin: 9.5 g/dL — ABNORMAL LOW (ref 12.0–15.0)
MCH: 26.5 pg (ref 26.0–34.0)
MCH: 27.3 pg (ref 26.0–34.0)
MCHC: 28.3 g/dL — ABNORMAL LOW (ref 30.0–36.0)
MCHC: 28.2 g/dL — ABNORMAL LOW (ref 30.0–36.0)
MCV: 93.5 fL (ref 80.0–100.0)
MCV: 96.8 fL (ref 80.0–100.0)
Platelets: 495 10*3/uL — ABNORMAL HIGH (ref 150–400)
Platelets: 502 10*3/uL — ABNORMAL HIGH (ref 150–400)
RBC: 3.7 MIL/uL — ABNORMAL LOW (ref 3.87–5.11)
RBC: 3.48 MIL/uL — ABNORMAL LOW (ref 3.87–5.11)
RDW: 18.2 % — ABNORMAL HIGH (ref 11.5–15.5)
RDW: 18.8 % — ABNORMAL HIGH (ref 11.5–15.5)
WBC: 15 10*3/uL — ABNORMAL HIGH (ref 4.0–10.5)
WBC: 13.2 10*3/uL — ABNORMAL HIGH (ref 4.0–10.5)
nRBC: 0 % (ref 0.0–0.2)
nRBC: 0 % (ref 0.0–0.2)

## 2023-03-11 LAB — BASIC METABOLIC PANEL
Anion gap: 3 — ABNORMAL LOW (ref 5–15)
Anion gap: 5 (ref 5–15)
BUN: 37 mg/dL — ABNORMAL HIGH (ref 8–23)
BUN: 42 mg/dL — ABNORMAL HIGH (ref 8–23)
CO2: 42 mmol/L — ABNORMAL HIGH (ref 22–32)
CO2: 42 mmol/L — ABNORMAL HIGH (ref 22–32)
Calcium: 8.9 mg/dL (ref 8.9–10.3)
Calcium: 9.1 mg/dL (ref 8.9–10.3)
Chloride: 95 mmol/L — ABNORMAL LOW (ref 98–111)
Chloride: 96 mmol/L — ABNORMAL LOW (ref 98–111)
Creatinine, Ser: 0.34 mg/dL — ABNORMAL LOW (ref 0.44–1.00)
Creatinine, Ser: 0.6 mg/dL (ref 0.44–1.00)
GFR, Estimated: >60 mL/min (ref >60)
GFR, Estimated: >60 mL/min (ref >60)
Glucose, Bld: 139 mg/dL — ABNORMAL HIGH (ref 70–99)
Glucose, Bld: 150 mg/dL — ABNORMAL HIGH (ref 70–99)
Potassium: 4.7 mmol/L (ref 3.5–5.1)
Potassium: 4.9 mmol/L (ref 3.5–5.1)
Sodium: 140 mmol/L (ref 135–145)
Sodium: 143 mmol/L (ref 135–145)

## 2023-03-11 LAB — GLUCOSE, CAPILLARY: Glucose-Capillary: 121 mg/dL — ABNORMAL HIGH (ref 70–99)

## 2023-03-11 LAB — MRSA NEXT GEN BY PCR, NASAL: MRSA by PCR Next Gen: NOT DETECTED

## 2023-03-11 MED ORDER — CHLORHEXIDINE GLUCONATE CLOTH 2 % EX PADS: 6.0000 | MEDICATED_PAD | Freq: Every day | CUTANEOUS | Status: DC

## 2023-03-11 NOTE — Progress Notes (Addendum)
PROGRESS NOTE  Kaitlin [name] ZOX:[id number] DOB: 12-04-1926 DOA: 03/03/2023 PCP: Thana Ates, MD   LOS: 6 days   Brief Narrative / Interim history: 89 year old female with anxiety, depression, A-fib, CAD, prior CVA, chronic dysphagia comes into the hospital with shortness of breath.  She was recently admitted 02/23/2023-03/03/2023 with multifocal pneumonia, bilateral parapneumonic effusions status post completed course of antibiotics as well as 650 mL of thoracentesis, comes into the hospital 1 day after discharge with shortness of breath.  Her son also reports that she has had increased confusion at home, poor appetite, difficulty sleeping.  Chest x-ray on admission showed bilateral pleural effusions with bibasilar atelectasis versus edema.  She was admitted to the hospital, stop Lasix and eventually Unasyn due to ongoing menstruation  Subjective / 24h Interval events: Son is at bedside, she had a regular evening last night, was alert, calm.  Patient expressed wishes not to have hospice at home.  Son wants to hospital bed, oxygen, and perhaps take him home later this week  Assesement and Plan: Principal Problem:   Hyponatremia Active Problems:   Persistent atrial fibrillation (HCC)   ANXIETY DEPRESSION   GERD   Essential hypertension, benign   Hyperlipidemia   Normocytic anemia   Generalized weakness   Aspiration into airway   Need for emotional support   Adult failure to thrive   Chronic pulmonary aspiration   Counseling and coordination of care   Palliative care encounter  Principal problem Failure to thrive/debility, deconditioning, goals of care- Decline since the last 6 months, especially since her fall and her sacral fractures.  Following that she has had hospitalization for pneumonia, and recurrent hospitalization currently.  She has chronic aspiration, extremely frail and in significant pain.  Discussed with the son, will liberalized her pain medications as she clearly  is suffering.  He understands the extremely poor prognosis -Continues to have significant pain, continue fentanyl patch, dose was increased to 25 mcg and seems to be working better. -Likely with home regimen of fentanyl patch and liquid oxycodone  Active problems Pelvic pain, recent sacral fractures -liberalize pain medications as above  Abdominal pain -quite tender when I touch her right lower quadrant.  CT scan unremarkable.  Likely constipation.  Glycerin suppository   Hyponatremia -multifactorial in the setting of some fluid overload on admission due to elevated BNP and bilateral pleural effusions.  Sodium now normalized but I think that she is going to jump on the other side and get dehydrated/hypernatremic given poor p.o. intake -Allow for natural course.  No need for IV fluids  Concern for aspiration, esophageal strictures -seeing GI as an outpatient, most recent EGD with dilation was in January 2023.  Comfort feeding  Aspiration pneumonia -ongoing aspiration.  White count improving on Unasyn, continue probably 5-7-day course.  She will need liquid Augmentin at home  Pulmonary hypertension, RV systolic dysfunction -2D echo done 5/21 showed LVEF 60-65%, RV was enlarged and PA pressure was 56 mmHg.  She has a small pericardial effusion, also severe tricuspid valve regurgitation.  Received Lasix x 2.  Quite dry currently with poor p.o. intake.  Continue oxygen  Persistent atrial fibrillation (HCC) - CHA2DS2-VASc Score of at least 5.   Anxiety/depression- Has been having insomnia recently. On low-dose alprazolam as needed.   Recent multifocal pneumonia with bilateral parapneumonic effusions - pulmonary was consulted during her prior hospital stay for pleural effusion and thoracocentesis was done with removal of 650 mL of fluid.  Pleural fluid culture with no  organisms, and pleural fluid cytology with no malignant cells.    Acute metabolic encephalopathy -likely in the setting of acute  illness/rehospitalization.  She recently had an MRI of the brain on 02/24/2023 which was without acute findings, mental status varies and she is confused at times.  Suspect in the setting of pain medications   Previous NSTEMI 01/2014 with normal coronaries -no chest pain, stable   MGUS - Will need outpatient follow-up    RUL mass - Previously seen by Dr. Everardo All of pulmonology in the outpatient setting.  Family at that time was interested in active surveillance. Recent increase in size noted.  Recommend follow-up with pulmonary as outpatient.  Disposition -patient does not want hospice involved.  Will order.  Oxygen  Scheduled Meds:  apixaban  2.5 mg Oral BID   calcium carbonate  1 tablet Oral BID   cholecalciferol  1,000 Units Oral Daily   fentaNYL  1 patch Transdermal Q72H   guaiFENesin  600 mg Oral BID   ipratropium-albuterol  3 mL Nebulization BID   lidocaine  1 patch Transdermal Q24H   magnesium oxide  400 mg Oral QHS   mirtazapine  7.5 mg Oral QHS   multivitamin  15 mL Oral Daily   nystatin  5 mL Oral QID   polyethylene glycol  8.5 g Oral Daily   senna  1 tablet Oral Daily   sodium chloride  1 g Oral TID WC   vitamin B-12  100 mcg Oral Daily   Continuous Infusions:  ampicillin-sulbactam (UNASYN) IV 3 g (03/10/23 2242)   PRN Meds:.acetaminophen **OR** acetaminophen, albuterol, ALPRAZolam, fentaNYL (SUBLIMAZE) injection, methocarbamol, nitroGLYCERIN, ondansetron **OR** ondansetron (ZOFRAN) IV, oxyCODONE  Current Outpatient Medications  Medication Instructions   albuterol (VENTOLIN HFA) 108 (90 Base) MCG/ACT inhaler 2 puffs, Inhalation, Every 6 hours PRN   ALPRAZolam (XANAX) 0.125 mg, Oral, 3 times daily PRN, For sleep   apixaban (ELIQUIS) 2.5 mg, Oral, 2 times daily   calcium carbonate (TUMS - DOSED IN MG ELEMENTAL CALCIUM) 500 MG chewable tablet 200 mg of elemental calcium, Oral, 2 times daily   cholecalciferol (VITAMIN D) 1,000 Units, Oral, Daily   Cyanocobalamin (VITAMIN  B 12 PO) 1 tablet, Oral, Daily   guaiFENesin (MUCINEX) 600 mg, Oral, 2 times daily   MAGNESIUM PO 2 tablets, Oral, Daily at bedtime   methocarbamol (ROBAXIN) 500 mg, Oral, Every 6 hours PRN   Multiple Vitamin (MULTIVITAMIN) LIQD 15 mLs, Oral, Daily   nitroGLYCERIN (NITROSTAT) 0.4 mg, Every 5 min PRN   ondansetron (ZOFRAN) 4 mg, Oral, Daily PRN   oxyCODONE (OXY IR/ROXICODONE) 5 mg, Oral, 3 times daily   oxyCODONE (OXYCONTIN) 10 mg, Oral, Every 12 hours   polyethylene glycol powder (MIRALAX) 17 g, Oral, 2 times daily PRN   sennosides-docusate sodium (SENOKOT-S) 8.6-50 MG tablet 1-2 tablets, Oral, Daily PRN    Diet Orders (From admission, onward)     Start     Ordered   03/06/23 1042  DIET DYS 3 Room service appropriate? Yes; Fluid consistency: Thin  Diet effective now       Question Answer Comment  Room service appropriate? Yes   Fluid consistency: Thin      03/06/23 1041            DVT prophylaxis: apixaban (ELIQUIS) tablet 2.5 mg Start: 02/23/2023 1500 apixaban (ELIQUIS) tablet 2.5 mg   Lab Results  Component Value Date   PLT 495 (H) 03/11/2023      Code Status: DNR  Family Communication:  son at bedside   Status is: inpatient   Level of care: Progressive  Consultants:  none  Objective: Vitals:   03/10/23 1948 03/11/23 0653 03/11/23 0722 03/11/23 0726  BP:  120/68    Pulse:      Resp:  19    Temp:  98.6 F (37 C)    TempSrc:  Oral    SpO2: 96% 98% 95% 95%  Weight:      Height:        Intake/Output Summary (Last 24 hours) at 03/11/2023 0750 Last data filed at 03/11/2023 0559 Gross per 24 hour  Intake 630 ml  Output 1100 ml  Net -470 ml    Wt Readings from Last 3 Encounters:  02/26/2023 42 kg  02/23/23 39.9 kg  02/05/23 39.9 kg    Examination:  Constitutional: NAD Eyes: lids and conjunctivae normal, no scleral icterus ENMT: mmm Neck: normal, supple Respiratory: clear to auscultation bilaterally, no wheezing, no crackles. Normal respiratory  effort.  Cardiovascular: Regular rate and rhythm, no murmurs / rubs / gallops. No LE edema. Abdomen: soft, no distention, no tenderness. Bowel sounds positive.    Data Reviewed: I have independently reviewed following labs and imaging studies   CBC Recent Labs  Lab 03/06/23 0352 03/07/23 0419 03/08/23 0412 03/10/23 0333 03/11/23 0651  WBC 11.4* 16.5* 18.9* 16.2* 15.0*  HGB 11.6* 12.7 12.6 10.1* 9.8*  HCT 36.4 39.0 41.9 32.4* 34.6*  PLT 409* 423* 484* 402* 495*  MCV 83.3 82.8 88.0 88.5 93.5  MCH 26.5 27.0 26.5 27.6 26.5  MCHC 31.9 32.6 30.1 31.2 28.3*  RDW 16.7* 17.1* 17.9* 17.9* 18.2*     Recent Labs  Lab 03/01/2023 0837 03/05/23 0427 03/06/23 0352 03/07/23 0419 03/08/23 0412 03/10/23 0333 03/11/23 0651  NA 125* 125* 125* 125* 128* 137 140  K 3.9 3.5 3.8 3.3* 4.4 4.4 4.7  CL 86* 88* 86* 83* 84* 92* 95*  CO2 31 30 29  33* 34* 34* 42*  GLUCOSE 105* 102* 77 92 115* 99 139*  BUN 8 6* 11 18 32* 49* 37*  CREATININE 0.36* <0.30* 0.45 0.47 0.87 0.59 0.34*  CALCIUM 8.0* 7.9* 8.1* 8.2* 9.0 8.6* 8.9  AST 18 17 15   --   --   --   --   ALT 14 11 12   --   --   --   --   ALKPHOS 108 100 94  --   --   --   --   BILITOT 0.8 0.8 0.8  --   --   --   --   ALBUMIN 2.6* 2.5* 2.6*  --   --   --   --   MG 1.8  --  1.9 1.9  --  2.3  --   BNP 321.7*  --   --   --   --   --   --      ------------------------------------------------------------------------------------------------------------------ No results for input(s): "CHOL", "HDL", "LDLCALC", "TRIG", "CHOLHDL", "LDLDIRECT" in the last 72 hours.  Lab Results  Component Value Date   HGBA1C 5.0 03/07/2017   ------------------------------------------------------------------------------------------------------------------ No results for input(s): "TSH", "T4TOTAL", "T3FREE", "THYROIDAB" in the last 72 hours.  Invalid input(s): "FREET3"  Cardiac Enzymes No results for input(s): "CKMB", "TROPONINI", "MYOGLOBIN" in the last 168  hours.  Invalid input(s): "CK" ------------------------------------------------------------------------------------------------------------------    Component Value Date/Time   BNP 321.7 (H) 03/11/2023 0837    CBG: Recent Labs  Lab 03/06/23 1842 03/07/23 0030 03/07/23 0648 03/07/23 1140 03/07/23 1804  GLUCAP 115* 108* 81 136* 135*     No results found for this or any previous visit (from the past 240 hour(s)).    Radiology Studies: No results found.   Pamella Pert, MD, PhD [facility]  Between 7 am - 7 pm I am available, please contact me via Amion (for emergencies) or Securechat (non urgent messages)  Between 7 pm - 7 am I am not available, please contact night coverage MD/APP via Amion

## 2023-03-11 NOTE — Progress Notes (Addendum)
Overnight   NAME: Kaitlin [name] MRN: [id number] DOB : 20-Jun-1927    Date of Service   03/11/2023   HPI/Events of Note    Notified by RR RN for rapid response.  Arrived to bedside to find patient obtunded. On arrival patient was on 15 L/min nonrebreather mask. Sats were 90% (cool extremities body temp 95 F +/-) Vitals were baseline for the patient with the exception of respirations which were mid 20s. Patient is on warming blanket as Temp is 75 F +/-    This is a 89 year old female with anxiety, depression, A-fib, CAD, prior CVA, chronic dysphagia admitted through the ER for shortness of breath. She was recently admitted 02/23/2023 to 03/03/2023 with multifocal pneumonia, bilateral parapneumonic effusions post completed course of antibiotics as well as a 650 mm thoracentesis. She returns to the hospital 1 day after previous discharge and shortness of breath. Son reports that she had increased confusion at home, poor appetite, difficulty sleeping. Chest x-ray on admission showed bilateral pleural effusions with bibasilar atelectasis versus edema.   Latest Reference Range & Units 03/11/23 21:28  pH, Ven 7.25 - 7.43  7.16 (LL)  pCO2, Ven 44 - 60 mmHg >123 (HH)  pO2, Ven 32 - 45 mmHg 201 (H)  Acid-Base Excess 0.0 - 2.0 mmol/L 13.6 (H)  Bicarbonate 20.0 - 28.0 mmol/L 48.1 (H)  O2 Saturation % 100  Patient temperature  37.0  (LL): Data is critically low [facility]): Data is critically high (H): Data is abnormally high   Latest Reference Range & Units 03/11/23 21:21  Sodium 135 - 145 mmol/L 143  Potassium 3.5 - 5.1 mmol/L 4.9  Chloride 98 - 111 mmol/L 96 (L)  CO2 22 - 32 mmol/L 42 (H)  Glucose 70 - 99 mg/dL 147 (H)  BUN 8 - 23 mg/dL 42 (H)  Creatinine 8.29 - 1.00 mg/dL 5.62  Calcium 8.9 - 13.0 mg/dL 9.1  Anion gap 5 - 15  5  GFR, Estimated >60 mL/min >60  (L): Data is abnormally low (H): Data is abnormally high   Latest Reference Range & Units 03/11/23 21:21   WBC 4.0 - 10.5 K/uL 13.2 (H)  RBC 3.87 - 5.11 MIL/uL 3.48 (L)  Hemoglobin 12.0 - 15.0 g/dL 9.5 (L)  HCT 86.5 - 78.4 % 33.7 (L)  MCV 80.0 - 100.0 fL 96.8  MCH 26.0 - 34.0 pg 27.3  MCHC 30.0 - 36.0 g/dL 69.6 (L)  RDW 29.5 - 28.4 % 18.8 (H)  Platelets 150 - 400 K/uL 502 (H)  nRBC 0.0 - 0.2 % 0.0  (H): Data is abnormally high (L): Data is abnormally low    Latest Reference Range & Units 03/11/23 20:35  Glucose-Capillary 70 - 99 mg/dL 132 (H)  (H): Data is abnormally high   Imaging results  FINDINGS: Cardiomegaly, aortic atherosclerosis. Mediastinal contours within normal limits. Mild perihilar and infrahilar opacities, improved since prior study, likely improving edema. No visible significant effusions. No acute bony abnormality.   IMPRESSION: Cardiomegaly. Improving perihilar and infrahilar/lower lobe opacities, likely improving edema/CHF.     Electronically Signed   By: Charlett Nose M.D.   On: 03/11/2023 21:40 =====================================================  Curbside discussion with Critical Care due to familiarity with patient (outpatient with pulmonary clinic)  Known to have a lung mass that has grown in size since February. Agree with progression to comfort care due to age comorbidities and current situation.  Discussed with family in room and conveyed lab results  current path and discussion with CCM.  At this point family is in agreement with no heroic efforts. Patient is now comfort care track   Family verifies: CPR - NO Vent NO ACLS - NO Pressors - NO  Defibrillation - NO  Family requests only pain control as needed and continuance of AM labs at this pont.    Interventions/ Plan   Family visitation Comfort Care Pathway Treat pain and symptoms as needed Comfort Measure Only at this time.      Chinita Greenland BSN MSNA MSN ACNPC-AG Acute Care Nurse Practitioner [facility]  ------------------------------------------------------------------------------------------------------------------------------------- ============================================================================    OVERNIGHT PROGRESS REPORT  Notified by RN that patient is deceased as of 0333 hrs  Patient was DNR/CMO (Comfort Measures Only)  2 RN verified.  NP at bedside   Family was available to RN and at bedside.  Patient exhibited rapid decrease in HR and Respirations on or about 0315 Hrs.  Time of Death as noted above.   Chinita Greenland MSNA ACNPC-AG Acute Care Nurse Practitioner [facility]

## 2023-03-11 NOTE — Progress Notes (Signed)
2LNC is required to maintain adequate levels of oxygen and promoting comfort for the  patient. Pt is 87% on RA. Pt was able to maintain oxygen level of 94-96% on 2LNC.

## 2023-03-11 NOTE — Progress Notes (Signed)
[facility] [employer], [employer] [facility]) Hospital Liaison Note   MSW spoke to son/Mike. At this time, family is not interested in hospice services. [facility] Palliative services offered and family declined this as well. Family report that if they later need services, they will contact [facility].  [facility] information and contact numbers given to family & above information shared with TOC.   Please call with any questions/concerns.    Thank you for the opportunity to participate in this patient's care.   Eugenie Birks, MSW [facility] Liaison

## 2023-03-11 NOTE — Telephone Encounter (Signed)
Pt son Kaitlin [name] stated that his mom Kaitlin [name] has been in and out of the hospital since the beginning of May and she wanted to reach out to Dr. Leone Payor and make him aware. Kaitlin [name] stated that he know's that Dr. Leone Payor is a GI doctor and may not be able to help but his mom just wanted to make Dr. Leone Payor aware and see if there were any recommendations. Pt currently admitted to hospital.  Pt son Kaitlin [name] made aware that Dr Leone Payor will be out of the office until next week.  Kaitlin [name]  verbalized understanding with all questions answered.

## 2023-03-11 NOTE — Significant Event (Signed)
Rapid Response Event Note   Reason for Call :  Patient unresponsive.  Initial Focused Assessment:  Patient found by writer to only be responsive to movement. Unable to obtain temperature orally or axillary, but rectal temperature found to be 95.4 F. BP 114/69 MAP 81, HR 89, RR 18. Wearing Non-rebreather mask at 15L/min because saturations found by respiratory therapist were in the 60's just prior to rapid response call. Patient doesn't appear to be in pain or having respiratory distress, but significant JVD seen bilaterally. Pupils are sluggish and GCS 3 at best.  Family reports ongoing health decline for several months, but yesterday patient was able to tell them she wanted to fight her illness to try to make it back to son's home.  Interventions:  Assessment, chart review, and discussion with family-specifically Kathlene November, patient's son. Labs: (CBG, VBG, BMP, CBC) and Chest Xray completed. Transferred patient to 1235 where bedside report given.  Plan of Care:  Transfer to STEPDOWN level of care for closer monitoring and further interventions as appropriate depending on test results. On call provider consulting with physicians and critical care. Family at bedside wants all care provided except no intubation, no CPR, no cardiac intervention or ACLS medications.   Event Summary:   MD Notified: Chinita Greenland, NP Call Time: 2030 Arrival Time: 2035 End Time: 2200  Lamona Curl, RN

## 2023-03-11 NOTE — Telephone Encounter (Signed)
Inbound call from patient son requesting to speak with a nurse in regards to patients care. States patient was recently in the hospital. Please advise.  Thank you

## 2023-03-11 NOTE — TOC Progression Note (Addendum)
Transition of Care [facility]) - Progression Note    Patient Details  Name: Kaitlin [name] MRN: [id number] Date of Birth: 02-Jul-1927  [facility]) CM/SW Contact  Howell Rucks, RN Phone Number: 03/11/2023, 9:39 AM  Clinical Narrative:  [facility] consult for hospice consult, per MD notes, pt and family declining home hospice, dc plan  is take pt home at end of the week. [employer] rep-Jermaine for home 02 and hospital bed. TOC will continue to follow.          Expected Discharge Plan and Services                                               Social Determinants of Health (SDOH) Interventions SDOH Screenings   Food Insecurity: No Food Insecurity (02/26/2023)  Housing: Low Risk  (02/22/2023)  Transportation Needs: No Transportation Needs (03/10/2023)  Utilities: Not At Risk (03/13/2023)  Tobacco Use: Low Risk  (02/28/2023)    Readmission Risk Interventions    09/25/2022   10:02 AM  Readmission Risk Prevention Plan  Post Dischage Appt Complete  Medication Screening Complete  Transportation Screening Complete

## 2023-03-12 ENCOUNTER — Encounter: Payer: Self-pay | Admitting: Cardiology

## 2023-03-12 MED ORDER — POLYVINYL ALCOHOL 1.4 % OP SOLN
1.0000 [drp] | OPHTHALMIC | Status: DC | PRN
  Filled 2023-03-12: qty 15

## 2023-03-15 NOTE — Progress Notes (Signed)
Patient is unresponsive, non reactive pupils, irregular breathing with O2 sat down to 70's and heart rate up to 135's. This RN assess patient is not in pain and no need for intervention at this time and NP made aware. Family is at bedside, calm and aware that patient may be dying.

## 2023-03-15 DEATH — deceased

## 2023-04-14 NOTE — Discharge Summary (Signed)
Death Summary  Kaitlin [name] ZOX:[id number] DOB: 16-Dec-1926 DOA: 2023/03/30  PCP: Thana Ates, MD  Admit date: Mar 30, 2023 Date of Death: 2023/04/07 Time of Death: 0200 am  History of present illness:  89 year old female with anxiety, depression, A-fib, CAD, prior CVA, chronic dysphagia comes into the hospital with shortness of breath.  She was recently admitted 02/23/2023-03/03/2023 with multifocal pneumonia, bilateral parapneumonic effusions status post completed course of antibiotics as well as 650 mL of thoracentesis, comes into the hospital 1 day after discharge with shortness of breath.  Her son also reports that she has had increased confusion at home, poor appetite, difficulty sleeping.  Chest x-ray on admission showed bilateral pleural effusions with bibasilar atelectasis versus edema.  She was admitted to the hospital, stop Lasix and eventually Unasyn due to ongoing aspiration. She was maintained on antibiotics and ongoing goals of care discussions were held with the family given extremely poor prognosis. Palliative care was consulted and hospice was considered. Unfortunately, patient continued to have ongoing aspiration events and eventually passed away on 04/07/2023.   Final Diagnoses:   FTT Sacral fractures Aspiration pneumonia Hypoxic respiratory failure  Hyponatremia Pulmonary hypertension RV systolic failure Esophageal strictures Persistent atrial fibrillation MGUS RUL mass Acute metabolic encephalopathy Bilateral parapneumonic effusions Severe protein calorie malnutrition Acute respiratory acidosis Anemia of chronic illness Leukocytosis     The results of significant diagnostics from this hospitalization (including imaging, microbiology, ancillary and laboratory) are listed below for reference.    Significant Diagnostic Studies: DG CHEST PORT 1 VIEW  Result Date: 03/11/2023 CLINICAL DATA:  Fever, shortness of breath EXAM: PORTABLE CHEST 1 VIEW COMPARISON:   Mar 30, 2023 FINDINGS: Cardiomegaly, aortic atherosclerosis. Mediastinal contours within normal limits. Mild perihilar and infrahilar opacities, improved since prior study, likely improving edema. No visible significant effusions. No acute bony abnormality. IMPRESSION: Cardiomegaly. Improving perihilar and infrahilar/lower lobe opacities, likely improving edema/CHF. Electronically Signed   By: Charlett Nose M.D.   On: 03/11/2023 21:40   CT ABDOMEN PELVIS WO CONTRAST  Result Date: 03/09/2023 CLINICAL DATA:  Abdominal pain. EXAM: CT ABDOMEN AND PELVIS WITHOUT CONTRAST TECHNIQUE: Multidetector CT imaging of the abdomen and pelvis was performed following the standard protocol without IV contrast. RADIATION DOSE REDUCTION: This exam was performed according to the departmental dose-optimization program which includes automated exposure control, adjustment of the mA and/or kV according to patient size and/or use of iterative reconstruction technique. COMPARISON:  February 05, 2023 FINDINGS: Lower chest: Bilateral pleural effusions. Bronchiectasis. Scarring versus atelectasis in the lingular and right middle lobe. Atelectasis versus airspace consolidation in the right lower lobe. Hepatobiliary: No focal lesions seen. Persistent moderate intrahepatic biliary ductal dilation. Pancreas: Chronic main pancreatic duct dilation. Spleen: Peripheral splenic calcifications. Adrenals/Urinary Tract: Adrenal glands are unremarkable. Left kidney is without renal calculi, focal lesion, or hydronephrosis. Subcentimeter right renal cyst. Bladder is unremarkable. Stomach/Bowel: Stomach is within normal limits. No evidence of bowel wall thickening, distention, or inflammatory changes. Moderate stool burden. Vascular/Lymphatic: Aortic atherosclerosis. No enlarged abdominal or pelvic lymph nodes. Reproductive: Status post hysterectomy. No adnexal masses. Other: Pelvic floor laxity.  No ascites. Musculoskeletal: Spondylosis and scoliosis of the  lumbosacral spine. IMPRESSION: 1. No acute findings within the abdomen or pelvis. 2. Bilateral pleural effusions. 3. Atelectasis versus airspace consolidation in the right lower lobe. 4. Persistent moderate intrahepatic biliary ductal dilation. 5. Chronic main pancreatic duct dilation. 6. Moderate stool burden. 7. Pelvic floor laxity. 8. Aortic atherosclerosis. Aortic Atherosclerosis (ICD10-I70.0). Electronically Signed   By: Ulanda Edison.D.  On: 03/09/2023 20:37   ECHOCARDIOGRAM COMPLETE  Result Date: 03/11/2023    ECHOCARDIOGRAM REPORT   Patient Name:   Kaitlin [name] [facility] Date of Exam: 03/03/2023 Medical Rec #:  [id number]            Height:       63.0 in Accession #:    [id number]           Weight:       88.0 lb Date of Birth:  02/20/1927           BSA:          1.364 m Patient Age:    89 years             BP:           172/89 mmHg Patient Gender: F                    HR:           84 bpm. Exam Location:  Inpatient Procedure: 2D Echo, Cardiac Doppler and Color Doppler Indications:    Dyspnea R06.00  History:        Patient has prior history of Echocardiogram examinations, most                 recent 07/18/2021. Previous Myocardial Infarction,                 Arrythmias:Atrial Fibrillation, Signs/Symptoms:Shortness of                 Breath; Risk Factors:Hypertension and Dyslipidemia.  Sonographer:    Aron Baba Referring Phys: [id number] DAVID MANUEL ORTIZ IMPRESSIONS  1. Left ventricular ejection fraction, by estimation, is 60 to 65%. The left ventricle has normal function. The left ventricle has no regional wall motion abnormalities. Left ventricular diastolic function could not be evaluated.  2. Right ventricular systolic function is mildly reduced. The right ventricular size is moderately enlarged. There is moderately elevated pulmonary artery systolic pressure. The estimated right ventricular systolic pressure is 56.0 mmHg.  3. Left atrial size was mildly dilated.  4. Right atrial size was  severely dilated.  5. A small pericardial effusion is present. The pericardial effusion is circumferential. There is no evidence of cardiac tamponade. Large pleural effusion in the left lateral region.  6. The mitral valve is normal in structure. Trivial mitral valve regurgitation.  7. 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  8. The aortic valve is tricuspid. Aortic valve regurgitation is trivial. No aortic stenosis is present.  9. The inferior vena cava is dilated in size with <50% respiratory variability, suggesting right atrial pressure of 15 mmHg. Comparison(s): No significant change from prior study. Prior images reviewed side by side. Changes from prior study are noted. There are new pericardial and pleural effusions and the inferior vena cava is plethoric consistent with acute exacerbation of right heart failure. FINDINGS  Left Ventricle: Left ventricular ejection fraction, by estimation, is 60 to 65%. The left ventricle has normal function. The left ventricle has no regional wall motion abnormalities. The left ventricular internal cavity size was normal in size. There is  no left ventricular hypertrophy. Left ventricular diastolic function could not be evaluated due to atrial fibrillation. Left ventricular diastolic function could not be evaluated. Right Ventricle: The right ventricular size is moderately enlarged. No increase in right ventricular wall thickness. Right ventricular systolic function is mildly reduced. There is moderately elevated pulmonary artery systolic pressure. The tricuspid  regurgitant velocity is 3.20 m/s, and with an assumed right atrial pressure of 15 mmHg, the estimated right ventricular systolic pressure is 56.0 mmHg. Left Atrium: Left atrial size was mildly dilated. Right Atrium: Right atrial size was severely dilated. Pericardium: A small pericardial effusion is present. The pericardial effusion is circumferential. There is no evidence of  cardiac tamponade. Mitral Valve: The mitral valve is normal in structure. Trivial mitral valve regurgitation. 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 Aortic Valve: The aortic valve is tricuspid. Aortic valve regurgitation is trivial. Aortic regurgitation PHT measures 640 msec. No aortic stenosis is present. Pulmonic Valve: The pulmonic valve was grossly normal. Pulmonic valve regurgitation is mild. Aorta: The aortic root is normal in size and structure. Venous: The inferior vena cava is dilated in size with less than 50% respiratory variability, suggesting right atrial pressure of 15 mmHg. IAS/Shunts: No atrial level shunt detected by color flow Doppler. Additional Comments: There is a large pleural effusion in the left lateral region.  LEFT VENTRICLE PLAX 2D LVIDd:         3.70 cm   Diastology LVIDs:         2.50 cm   LV e' medial:    4.14 cm/s LV PW:         1.10 cm   LV E/e' medial:  27.8 LV IVS:        0.70 cm   LV e' lateral:   8.51 cm/s LVOT diam:     1.70 cm   LV E/e' lateral: 13.5 LV SV:         31 LV SV Index:   23 LVOT Area:     2.27 cm  RIGHT VENTRICLE RV S prime:     11.00 cm/s TAPSE (M-mode): 1.4 cm LEFT ATRIUM              Index         RIGHT ATRIUM           Index LA diam:        4.20 cm  3.08 cm/m    RA Area:     37.80 cm LA Vol (A2C):   155.0 ml 113.65 ml/m  RA Volume:   165.00 ml 120.98 ml/m LA Vol (A4C):   58.0 ml  42.53 ml/m LA Biplane Vol: 95.5 ml  70.02 ml/m  AORTIC VALVE             PULMONIC VALVE LVOT Vmax:   71.30 cm/s  PR End Diast Vel: 9.12 msec LVOT Vmean:  48.800 cm/s LVOT VTI:    0.137 m AI PHT:      640 msec  AORTA Ao Root diam: 3.00 cm Ao Asc diam:  3.30 cm MITRAL VALVE                TRICUSPID VALVE MV Area (PHT): 5.42 cm     TR Peak grad:   41.0 mmHg MV Decel Time: 140 msec     TR Vmax:        320.00 cm/s MV E velocity: 115.00 cm/s                             SHUNTS                             Systemic VTI:  0.14 m  Systemic Diam: 1.70 cm Thurmon Fair MD Electronically signed by Thurmon Fair MD Signature Date/Time: 03-14-2023/3:12:25 PM    Final    DG Chest Portable 1 View  Result Date: 03-14-23 CLINICAL DATA:  Shortness of breath. EXAM: PORTABLE CHEST 1 VIEW COMPARISON:  Feb 25, 2023. FINDINGS: Stable cardiomediastinal silhouette. Bilateral pleural effusions are now noted with associated bibasilar atelectasis or edema. Bony thorax is unremarkable. IMPRESSION: Bilateral pleural effusions are now noted with associated bibasilar atelectasis or edema. Electronically Signed   By: Lupita Raider M.D.   On: 03/14/2023 08:48    Microbiology: No results found for this or any previous visit (from the past 240 hour(s)).   Labs: Basic Metabolic Panel: No results for input(s): "NA", "K", "CL", "CO2", "GLUCOSE", "BUN", "CREATININE", "CALCIUM", "MG", "PHOS" in the last 168 hours. Liver Function Tests: No results for input(s): "AST", "ALT", "ALKPHOS", "BILITOT", "PROT", "ALBUMIN" in the last 168 hours. No results for input(s): "LIPASE", "AMYLASE" in the last 168 hours. No results for input(s): "AMMONIA" in the last 168 hours. CBC: No results for input(s): "WBC", "NEUTROABS", "HGB", "HCT", "MCV", "PLT" in the last 168 hours. Cardiac Enzymes: No results for input(s): "CKTOTAL", "CKMB", "CKMBINDEX", "TROPONINI" in the last 168 hours. D-Dimer No results for input(s): "DDIMER" in the last 72 hours. BNP: Invalid input(s): "POCBNP" CBG: No results for input(s): "GLUCAP" in the last 168 hours. Anemia work up No results for input(s): "VITAMINB12", "FOLATE", "FERRITIN", "TIBC", "IRON", "RETICCTPCT" in the last 72 hours. Urinalysis    Component Value Date/Time   COLORURINE YELLOW 03/14/23 1457   APPEARANCEUR CLOUDY (A) 14-Mar-2023 1457   LABSPEC 1.006 Mar 14, 2023 1457   PHURINE 8.0 03-14-2023 1457   GLUCOSEU NEGATIVE Mar 14, 2023 1457   HGBUR MODERATE (A) Mar 14, 2023 1457   BILIRUBINUR NEGATIVE  2023-03-14 1457   KETONESUR NEGATIVE 03-14-2023 1457   PROTEINUR NEGATIVE 14-Mar-2023 1457   UROBILINOGEN 0.2 01/25/2014 1657   NITRITE NEGATIVE 03-14-23 1457   LEUKOCYTESUR NEGATIVE 2023/03/14 1457   Sepsis Labs No results for input(s): "WBC" in the last 168 hours.  Invalid input(s): "PROCALCITONIN", "LACTICIDVEN"     SIGNED:  Pamella Pert, MD  [facility] 04/01/2023, 12:23 PM Pager   If 7PM-7AM, please contact night-coverage [email or web address] Password [email or web address]
